# Patient Record
Sex: Female | Born: 1945
Health system: Southern US, Community
[De-identification: ages and names within clinical notes are randomized; demographics above are authoritative.]

## PROBLEM LIST (undated history)

## (undated) DIAGNOSIS — M26609 Unspecified temporomandibular joint disorder, unspecified side: Secondary | ICD-10-CM

## (undated) DIAGNOSIS — J189 Pneumonia, unspecified organism: Secondary | ICD-10-CM

## (undated) DIAGNOSIS — D333 Benign neoplasm of cranial nerves: Secondary | ICD-10-CM

## (undated) DIAGNOSIS — H8110 Benign paroxysmal vertigo, unspecified ear: Secondary | ICD-10-CM

## (undated) DIAGNOSIS — Z7901 Long term (current) use of anticoagulants: Secondary | ICD-10-CM

## (undated) DIAGNOSIS — I499 Cardiac arrhythmia, unspecified: Secondary | ICD-10-CM

## (undated) DIAGNOSIS — G4733 Obstructive sleep apnea (adult) (pediatric): Secondary | ICD-10-CM

## (undated) DIAGNOSIS — R350 Frequency of micturition: Secondary | ICD-10-CM

## (undated) DIAGNOSIS — E785 Hyperlipidemia, unspecified: Secondary | ICD-10-CM

## (undated) DIAGNOSIS — Z9289 Personal history of other medical treatment: Secondary | ICD-10-CM

## (undated) DIAGNOSIS — Z973 Presence of spectacles and contact lenses: Secondary | ICD-10-CM

## (undated) DIAGNOSIS — N840 Polyp of corpus uteri: Secondary | ICD-10-CM

## (undated) DIAGNOSIS — I10 Essential (primary) hypertension: Secondary | ICD-10-CM

## (undated) DIAGNOSIS — N950 Postmenopausal bleeding: Secondary | ICD-10-CM

## (undated) DIAGNOSIS — E119 Type 2 diabetes mellitus without complications: Secondary | ICD-10-CM

## (undated) DIAGNOSIS — M1990 Unspecified osteoarthritis, unspecified site: Secondary | ICD-10-CM

## (undated) DIAGNOSIS — I480 Paroxysmal atrial fibrillation: Secondary | ICD-10-CM

## (undated) DIAGNOSIS — Z8489 Family history of other specified conditions: Secondary | ICD-10-CM

## (undated) DIAGNOSIS — Z9989 Dependence on other enabling machines and devices: Secondary | ICD-10-CM

## (undated) DIAGNOSIS — D649 Anemia, unspecified: Secondary | ICD-10-CM

## (undated) HISTORY — PX: NASAL SINUS SURGERY: SHX719

## (undated) HISTORY — PX: ABDOMINAL HYSTERECTOMY: SHX81

## (undated) HISTORY — DX: Benign paroxysmal vertigo, unspecified ear: H81.10

---

## 1976-10-23 HISTORY — PX: TUBAL LIGATION: SHX77

## 1991-10-24 HISTORY — PX: KNEE SURGERY: SHX244

## 1998-10-23 HISTORY — PX: TOTAL ELBOW REPLACEMENT: SUR1214

## 1999-07-26 ENCOUNTER — Emergency Department (HOSPITAL_COMMUNITY): Admission: EM | Admit: 1999-07-26 | Discharge: 1999-07-26 | Payer: Self-pay | Admitting: Emergency Medicine

## 1999-07-26 ENCOUNTER — Encounter: Payer: Self-pay | Admitting: Emergency Medicine

## 2000-01-10 ENCOUNTER — Encounter: Admission: RE | Admit: 2000-01-10 | Discharge: 2000-01-10 | Payer: Self-pay | Admitting: Gynecology

## 2000-01-10 ENCOUNTER — Encounter: Payer: Self-pay | Admitting: Gynecology

## 2000-04-18 ENCOUNTER — Encounter: Payer: Self-pay | Admitting: Gynecology

## 2000-04-18 ENCOUNTER — Encounter: Admission: RE | Admit: 2000-04-18 | Discharge: 2000-04-18 | Payer: Self-pay | Admitting: Gynecology

## 2001-01-31 ENCOUNTER — Encounter: Admission: RE | Admit: 2001-01-31 | Discharge: 2001-01-31 | Payer: Self-pay | Admitting: Gynecology

## 2001-01-31 ENCOUNTER — Encounter: Payer: Self-pay | Admitting: Gynecology

## 2001-04-30 ENCOUNTER — Other Ambulatory Visit: Admission: RE | Admit: 2001-04-30 | Discharge: 2001-04-30 | Payer: Self-pay | Admitting: Gynecology

## 2002-02-05 ENCOUNTER — Encounter: Admission: RE | Admit: 2002-02-05 | Discharge: 2002-02-05 | Payer: Self-pay | Admitting: Gynecology

## 2002-02-05 ENCOUNTER — Encounter: Payer: Self-pay | Admitting: Gynecology

## 2002-03-28 ENCOUNTER — Encounter: Payer: Self-pay | Admitting: Gynecology

## 2002-03-28 ENCOUNTER — Encounter: Admission: RE | Admit: 2002-03-28 | Discharge: 2002-03-28 | Payer: Self-pay | Admitting: Gynecology

## 2002-05-09 ENCOUNTER — Other Ambulatory Visit: Admission: RE | Admit: 2002-05-09 | Discharge: 2002-05-09 | Payer: Self-pay | Admitting: Gynecology

## 2002-11-04 ENCOUNTER — Encounter: Admission: RE | Admit: 2002-11-04 | Discharge: 2002-11-04 | Payer: Self-pay | Admitting: Otolaryngology

## 2002-11-04 ENCOUNTER — Encounter: Payer: Self-pay | Admitting: Otolaryngology

## 2003-02-10 ENCOUNTER — Encounter: Payer: Self-pay | Admitting: Gynecology

## 2003-02-10 ENCOUNTER — Encounter: Admission: RE | Admit: 2003-02-10 | Discharge: 2003-02-10 | Payer: Self-pay | Admitting: Gynecology

## 2003-05-28 ENCOUNTER — Other Ambulatory Visit: Admission: RE | Admit: 2003-05-28 | Discharge: 2003-05-28 | Payer: Self-pay | Admitting: Gynecology

## 2004-02-17 ENCOUNTER — Encounter: Admission: RE | Admit: 2004-02-17 | Discharge: 2004-02-17 | Payer: Self-pay | Admitting: Gynecology

## 2004-03-17 ENCOUNTER — Encounter: Admission: RE | Admit: 2004-03-17 | Discharge: 2004-03-17 | Payer: Self-pay | Admitting: Orthopedic Surgery

## 2004-04-13 ENCOUNTER — Encounter: Admission: RE | Admit: 2004-04-13 | Discharge: 2004-04-13 | Payer: Self-pay | Admitting: Gynecology

## 2004-04-22 HISTORY — PX: ROTATOR CUFF REPAIR: SHX139

## 2004-06-15 ENCOUNTER — Other Ambulatory Visit: Admission: RE | Admit: 2004-06-15 | Discharge: 2004-06-15 | Payer: Self-pay | Admitting: Gynecology

## 2004-08-23 ENCOUNTER — Encounter: Admission: RE | Admit: 2004-08-23 | Discharge: 2004-08-23 | Payer: Self-pay | Admitting: Otolaryngology

## 2005-05-24 ENCOUNTER — Encounter: Admission: RE | Admit: 2005-05-24 | Discharge: 2005-05-24 | Payer: Self-pay | Admitting: Gynecology

## 2005-07-06 ENCOUNTER — Other Ambulatory Visit: Admission: RE | Admit: 2005-07-06 | Discharge: 2005-07-06 | Payer: Self-pay | Admitting: Gynecology

## 2005-10-14 ENCOUNTER — Encounter: Admission: RE | Admit: 2005-10-14 | Discharge: 2005-10-14 | Payer: Self-pay | Admitting: Otolaryngology

## 2006-05-29 ENCOUNTER — Encounter: Admission: RE | Admit: 2006-05-29 | Discharge: 2006-05-29 | Payer: Self-pay | Admitting: Gynecology

## 2006-07-17 ENCOUNTER — Other Ambulatory Visit: Admission: RE | Admit: 2006-07-17 | Discharge: 2006-07-17 | Payer: Self-pay | Admitting: Gynecology

## 2007-04-17 ENCOUNTER — Ambulatory Visit (HOSPITAL_BASED_OUTPATIENT_CLINIC_OR_DEPARTMENT_OTHER): Admission: RE | Admit: 2007-04-17 | Discharge: 2007-04-17 | Payer: Self-pay | Admitting: Orthopedic Surgery

## 2007-04-17 HISTORY — PX: CARPAL TUNNEL RELEASE: SHX101

## 2007-06-04 ENCOUNTER — Encounter: Admission: RE | Admit: 2007-06-04 | Discharge: 2007-06-04 | Payer: Self-pay | Admitting: Gynecology

## 2007-07-24 ENCOUNTER — Other Ambulatory Visit: Admission: RE | Admit: 2007-07-24 | Discharge: 2007-07-24 | Payer: Self-pay | Admitting: Gynecology

## 2008-07-07 ENCOUNTER — Encounter: Admission: RE | Admit: 2008-07-07 | Discharge: 2008-07-07 | Payer: Self-pay | Admitting: Gynecology

## 2008-10-23 HISTORY — PX: KNEE ARTHROSCOPY: SUR90

## 2008-10-23 HISTORY — PX: HAMMER TOE SURGERY: SHX385

## 2008-12-16 ENCOUNTER — Encounter: Admission: RE | Admit: 2008-12-16 | Discharge: 2008-12-16 | Payer: Self-pay | Admitting: Otolaryngology

## 2009-06-14 ENCOUNTER — Encounter: Admission: RE | Admit: 2009-06-14 | Discharge: 2009-06-14 | Payer: Self-pay | Admitting: Internal Medicine

## 2009-07-08 ENCOUNTER — Encounter: Admission: RE | Admit: 2009-07-08 | Discharge: 2009-07-08 | Payer: Self-pay | Admitting: Gynecology

## 2009-11-19 DIAGNOSIS — M25519 Pain in unspecified shoulder: Secondary | ICD-10-CM | POA: Insufficient documentation

## 2010-01-12 ENCOUNTER — Inpatient Hospital Stay (HOSPITAL_COMMUNITY): Admission: RE | Admit: 2010-01-12 | Discharge: 2010-01-14 | Payer: Self-pay | Admitting: Orthopedic Surgery

## 2010-01-12 HISTORY — PX: TOTAL KNEE ARTHROPLASTY: SHX125

## 2010-07-11 ENCOUNTER — Encounter: Admission: RE | Admit: 2010-07-11 | Discharge: 2010-07-11 | Payer: Self-pay | Admitting: Gynecology

## 2010-07-14 ENCOUNTER — Encounter: Admission: RE | Admit: 2010-07-14 | Discharge: 2010-07-14 | Payer: Self-pay | Admitting: Gynecology

## 2010-10-23 HISTORY — PX: TOE SURGERY: SHX1073

## 2011-01-15 LAB — COMPREHENSIVE METABOLIC PANEL
BUN: 9 mg/dL (ref 6–23)
CO2: 30 mEq/L (ref 19–32)
Calcium: 10.2 mg/dL (ref 8.4–10.5)
Chloride: 102 mEq/L (ref 96–112)
Creatinine, Ser: 0.61 mg/dL (ref 0.4–1.2)
GFR calc non Af Amer: >60 mL/min (ref >60)
Glucose, Bld: 95 mg/dL (ref 70–99)
Total Bilirubin: 0.9 mg/dL (ref 0.3–1.2)

## 2011-01-15 LAB — APTT: aPTT: 29 seconds (ref 24–37)

## 2011-01-15 LAB — GLUCOSE, CAPILLARY
Glucose-Capillary: 122 mg/dL — ABNORMAL HIGH (ref 70–99)
Glucose-Capillary: 132 mg/dL — ABNORMAL HIGH (ref 70–99)
Glucose-Capillary: 135 mg/dL — ABNORMAL HIGH (ref 70–99)
Glucose-Capillary: 173 mg/dL — ABNORMAL HIGH (ref 70–99)

## 2011-01-15 LAB — URINALYSIS, ROUTINE W REFLEX MICROSCOPIC
Bilirubin Urine: NEGATIVE
Glucose, UA: NEGATIVE mg/dL
Ketones, ur: NEGATIVE mg/dL
Nitrite: NEGATIVE
Nitrite: NEGATIVE
Protein, ur: NEGATIVE mg/dL
Specific Gravity, Urine: 1.016 (ref 1.005–1.030)
Urobilinogen, UA: 0.2 mg/dL (ref 0.0–1.0)
pH: 6.5 (ref 5.0–8.0)
pH: 6.5 (ref 5.0–8.0)

## 2011-01-15 LAB — BASIC METABOLIC PANEL
BUN: 6 mg/dL (ref 6–23)
Calcium: 8.9 mg/dL (ref 8.4–10.5)
Creatinine, Ser: 0.5 mg/dL (ref 0.4–1.2)
GFR calc Af Amer: >60 mL/min (ref >60)
GFR calc non Af Amer: >60 mL/min (ref >60)
GFR calc non Af Amer: >60 mL/min (ref >60)
Glucose, Bld: 132 mg/dL — ABNORMAL HIGH (ref 70–99)
Glucose, Bld: 137 mg/dL — ABNORMAL HIGH (ref 70–99)
Potassium: 3.6 mEq/L (ref 3.5–5.1)
Potassium: 4.3 mEq/L (ref 3.5–5.1)
Sodium: 135 mEq/L (ref 135–145)

## 2011-01-15 LAB — CBC
HCT: 31.6 % — ABNORMAL LOW (ref 36.0–46.0)
HCT: 40.9 % (ref 36.0–46.0)
Hemoglobin: 10.7 g/dL — ABNORMAL LOW (ref 12.0–15.0)
MCHC: 34.8 g/dL (ref 30.0–36.0)
MCV: 85 fL (ref 78.0–100.0)
Platelets: DECREASED 10*3/uL (ref 150–400)
RBC: 3.66 MIL/uL — ABNORMAL LOW (ref 3.87–5.11)
RBC: 4.81 MIL/uL (ref 3.87–5.11)
RDW: 13.6 % (ref 11.5–15.5)
RDW: 13.9 % (ref 11.5–15.5)
WBC: 7.3 10*3/uL (ref 4.0–10.5)
WBC: 9.2 10*3/uL (ref 4.0–10.5)

## 2011-01-15 LAB — PROTIME-INR
INR: 1 (ref 0.00–1.49)
INR: 1.11 (ref 0.00–1.49)
Prothrombin Time: 13.1 seconds (ref 11.6–15.2)
Prothrombin Time: 14.5 seconds (ref 11.6–15.2)

## 2011-01-15 LAB — URINE CULTURE

## 2011-01-15 LAB — TYPE AND SCREEN: ABO/RH(D): A POS

## 2011-01-15 LAB — ABO/RH: ABO/RH(D): A POS

## 2011-01-15 LAB — URINE MICROSCOPIC-ADD ON

## 2011-03-07 NOTE — Op Note (Signed)
NAMEBRECKEN, Erika Kim               ACCOUNT NO.:  [id number]   MEDICAL RECORD NO.:  [id number]          PATIENT TYPE:  AMB   LOCATION:  DSC                          FACILITY:  [facility]   PHYSICIAN:  Loreta Ave, M.D. DATE OF BIRTH:  08/03/1946   DATE OF PROCEDURE:  04/17/2007  DATE OF DISCHARGE:                               OPERATIVE REPORT   PREOPERATIVE DIAGNOSIS:  1. Right carpal tunnel syndrome.  2. Volar retinacular ganglion, right thumb.  3. Triggering A1 pulley, right thumb.   POSTOPERATIVE DIAGNOSIS:  1. Right carpal tunnel syndrome.  2. Volar retinacular ganglion, right thumb.  3. Triggering A1 pulley, right thumb.   PROCEDURE:  1. Right carpal tunnel release.  2. Excision ganglion volar aspect of the right thumb.  3. Release A1 pulley to obliterate triggering.   SURGEON:  Loreta Ave, M.D.   ASSISTANT:  Genene Churn. Barry Dienes, P.A.-C.   ANESTHESIA:  General.   BLOOD LOSS:  Minimal.   TOURNIQUET TIME:  35 minutes.   SPECIMENS:  None.   CULTURES:  None.   COMPLICATIONS:  None.   DRESSING:  Soft compressive with a well padded dressing and volar  splint.   DESCRIPTION OF PROCEDURE:  The patient was brought to the operating  room, placed on operating table in supine position.  After adequate  anesthesia had been obtained, tourniquet applied to the upper aspect of  the right arm.  Prepped and draped in the usual sterile fashion.  Exsanguination with elevation of Esmarch. Tourniquet inflated to 250  mmHg.  A small incision over the volar aspect carpal tunnel.  Skin and  subcutaneous tissue divided avoiding the palmar branch of median nerve.  Retinaculum over the carpal tunnel identified and incised under direct  visualization from the forearm fascia proximally to the palmar arch  distally.  Moderate constriction right in the middle of the carpal  tunnel.  The nerve completely decompressed including aponeurotomy.  Digital branch and motor branch identified,  protected, decompressed.  The base of the carpal tunnel was palpated going down over where her  fracture was and there were no other areas of pressure from previous  fracture.  Wound irrigated and closed with nylon.   Attention turned to the thumb.  Transverse incision over the A1 pulley  region.  Skin and subcutaneous tissue divided.  Neurovascular bundles  identified, protected, and retracted.  About a 4 mm volar ganglion,  thickened, coming off the middle of the A1 pulley, excised in its  entirety.  I then did a complete release of the A1 pulley from proximal  to distal obliterating all triggering of the flexor tendon.  Some  attrition on the tendon, but still functionally intact.  Once I  confirmed complete obliteration of triggering, the wound was irrigated  and closed with nylon.  Sterile compressive dressing applied.  Volar  splint applied.  Tourniquet deflated and removed.  Anesthesia reversed.  Brought to the recovery room.  Tolerated surgery well.  No  complications.      Loreta Ave, M.D.  Electronically Signed     DFM/MEDQ  D:  04/17/2007  T:  04/17/2007  Job:  [id number]

## 2011-06-08 ENCOUNTER — Other Ambulatory Visit: Payer: Self-pay | Admitting: Gynecology

## 2011-06-08 DIAGNOSIS — Z1231 Encounter for screening mammogram for malignant neoplasm of breast: Secondary | ICD-10-CM

## 2011-07-18 ENCOUNTER — Ambulatory Visit: Payer: Self-pay

## 2011-07-19 DIAGNOSIS — Z09 Encounter for follow-up examination after completed treatment for conditions other than malignant neoplasm: Secondary | ICD-10-CM | POA: Insufficient documentation

## 2011-07-20 ENCOUNTER — Ambulatory Visit
Admission: RE | Admit: 2011-07-20 | Discharge: 2011-07-20 | Disposition: A | Discharge status: Home / Self Care | Payer: 59 | Source: Ambulatory Visit | Attending: Gynecology | Admitting: Gynecology

## 2011-07-20 DIAGNOSIS — Z1231 Encounter for screening mammogram for malignant neoplasm of breast: Secondary | ICD-10-CM

## 2011-08-09 LAB — BASIC METABOLIC PANEL
CO2: 30
Calcium: 10.5
Creatinine, Ser: 0.59
GFR calc Af Amer: >60
GFR calc non Af Amer: >60
Sodium: 141

## 2011-08-09 LAB — POCT HEMOGLOBIN-HEMACUE: Hemoglobin: 13.5

## 2011-08-24 ENCOUNTER — Other Ambulatory Visit: Payer: Self-pay | Admitting: Gynecology

## 2012-04-29 DIAGNOSIS — D333 Benign neoplasm of cranial nerves: Secondary | ICD-10-CM | POA: Insufficient documentation

## 2012-06-17 ENCOUNTER — Other Ambulatory Visit: Payer: Self-pay | Admitting: Gynecology

## 2012-06-17 DIAGNOSIS — Z1231 Encounter for screening mammogram for malignant neoplasm of breast: Secondary | ICD-10-CM

## 2012-07-23 ENCOUNTER — Ambulatory Visit
Admission: RE | Admit: 2012-07-23 | Discharge: 2012-07-23 | Disposition: A | Discharge status: Home / Self Care | Payer: 59 | Source: Ambulatory Visit | Attending: Gynecology | Admitting: Gynecology

## 2012-07-23 DIAGNOSIS — Z1231 Encounter for screening mammogram for malignant neoplasm of breast: Secondary | ICD-10-CM

## 2012-08-27 ENCOUNTER — Other Ambulatory Visit: Payer: Self-pay | Admitting: Gynecology

## 2012-09-16 DIAGNOSIS — H8110 Benign paroxysmal vertigo, unspecified ear: Secondary | ICD-10-CM | POA: Insufficient documentation

## 2013-07-03 ENCOUNTER — Other Ambulatory Visit: Payer: Self-pay

## 2013-07-03 DIAGNOSIS — Z1231 Encounter for screening mammogram for malignant neoplasm of breast: Secondary | ICD-10-CM

## 2013-07-30 ENCOUNTER — Ambulatory Visit
Admission: RE | Admit: 2013-07-30 | Discharge: 2013-07-30 | Disposition: A | Discharge status: Home / Self Care | Payer: Medicare Other | Source: Ambulatory Visit

## 2013-07-30 DIAGNOSIS — Z1231 Encounter for screening mammogram for malignant neoplasm of breast: Secondary | ICD-10-CM

## 2013-08-06 DIAGNOSIS — F5104 Psychophysiologic insomnia: Secondary | ICD-10-CM | POA: Insufficient documentation

## 2013-08-08 ENCOUNTER — Ambulatory Visit (INDEPENDENT_AMBULATORY_CARE_PROVIDER_SITE_OTHER): Payer: Medicare Other | Admitting: Neurology

## 2013-08-08 ENCOUNTER — Encounter: Payer: Self-pay | Admitting: Neurology

## 2013-08-08 ENCOUNTER — Telehealth: Payer: Self-pay | Admitting: Neurology

## 2013-08-08 VITALS — BP 141/67 | HR 54 | Temp 97.9°F | Ht 63.0 in | Wt 214.0 lb

## 2013-08-08 DIAGNOSIS — R0609 Other forms of dyspnea: Secondary | ICD-10-CM

## 2013-08-08 DIAGNOSIS — E669 Obesity, unspecified: Secondary | ICD-10-CM | POA: Insufficient documentation

## 2013-08-08 DIAGNOSIS — G4733 Obstructive sleep apnea (adult) (pediatric): Secondary | ICD-10-CM

## 2013-08-08 DIAGNOSIS — R0683 Snoring: Secondary | ICD-10-CM | POA: Insufficient documentation

## 2013-08-08 DIAGNOSIS — G4763 Sleep related bruxism: Secondary | ICD-10-CM

## 2013-08-08 DIAGNOSIS — R0989 Other specified symptoms and signs involving the circulatory and respiratory systems: Secondary | ICD-10-CM

## 2013-08-08 DIAGNOSIS — I10 Essential (primary) hypertension: Secondary | ICD-10-CM

## 2013-08-08 NOTE — Telephone Encounter (Signed)
I called and left a message for the patient that her estimated cost for the sleep study would be $343.35. Patient to callback to the office to speak with me Jasmine December.

## 2013-08-08 NOTE — Patient Instructions (Addendum)
Based on your symptoms and your exam I believe you are at risk for obstructive sleep apnea or OSA, and I think we should proceed with a sleep study to determine whether you do or do not have OSA and how severe it is. If you have more than mild OSA, I want you to consider treatment with CPAP. Please remember, the risks and ramifications of moderate to severe obstructive sleep apnea or OSA are: Cardiovascular disease, including congestive heart failure, stroke, difficult to control hypertension, arrhythmias, and even type 2 diabetes has been linked to untreated OSA. Sleep apnea causes disruption of sleep and sleep deprivation in most cases, which, in turn, can cause recurrent headaches, problems with memory, mood, concentration, focus, and vigilance. Most people with untreated sleep apnea report excessive daytime sleepiness, which can affect their ability to drive. Please do not drive if you feel sleepy.  I will see you back after your sleep study to go over the test results and where to go from there. We will call you after your sleep study and to set up an appointment at the time.   Please remember to try to maintain good sleep hygiene, which means: Keep a regular sleep and wake schedule, try not to exercise or have a meal within 2 hours of your bedtime, try to keep your bedroom conducive for sleep, that is, cool and dark, without light distractors such as an illuminated alarm clock, and refrain from watching TV right before sleep or in the middle of the night and do not keep the TV or radio on during the night. Also, try not to use or play on electronic devices at bedtime, such as your cell phone, tablet PC or laptop. If you like to read at bedtime on an electronic device, try to dim the background light as much as possible.  Please bring you mouth guard for the test and we have mutually agreed, that you will not start your sleeping pill just yet.

## 2013-08-08 NOTE — Progress Notes (Signed)
Subjective:    Patient ID: Erika Kim is a 67 y.o. female.  HPI  Erika Foley, MD, PhD [facility] [address] [address] [place], [place] [address]  Dear Dr. Eloise Harman,  I saw your patient, Erika Kim, upon your kind request in my neurologic clinic today for initial consultation of her sleep disorder, in particular nonrestorative sleep, sleep disruption, morning headaches, and concern for objective sleep apnea. The patient is unaccompanied today. As you know, [name] is a very pleasant 67 year old right-handed woman with an underlying medical history of hyperlipidemia, hypertension, DM, recurrent headaches, positional vertigo, left shoulder pain, and sinus congestion, status post sinus surgery, left knee surgery, tubal ligation, right rotator cuff surgery, right total knee replacement surgery, right great toe surgery, who is complaining of chronic poor quality sleep, snoring, and recently has had some memory problems as well. She has had witnessed apneas, including one time when she had a head MRI. She has a Hx of acoustic neuroma, she sees Dr. Haroldine Laws in ENT and Dr. Manson Passey at [facility].  She was prescribed Ambien 5 mg recently, but has not filled it yet.   Her typical bedtime is reported to be around 11 to 11:30 PM and usual wake time is around 5 AM. Sleep onset typically occurs within a few minutes. She reports feeling adequately rested upon awakening. She wakes up on an average 3 times in the middle of the night and has to go to the bathroom 0 to 1 times on a typical night. She admits to occasional morning headaches.  She reports excessive daytime somnolence (EDS) and Her Epworth Sleepiness Score (ESS) is 6/24 today. She has not fallen asleep while driving. The patient has not been taking a planned nap, but occasionally has fallen asleep after lunch.   She has been known to snore for the past many years. Snoring is reportedly moderate to loud, and associated  with choking sounds and witnessed apneas. The patient admits to a sense of choking or strangling feeling occasionally. There is no report of nighttime reflux, with rare nighttime cough experienced. The patient has not noted any RLS symptoms and is not known to kick while asleep or before falling asleep, but does endorse occasional paresthesias in her big toe, that she tries to "shake out". There is no family history of RLS or OSA. She is claustrophobic and has concerns about having to use a FFM.  She is not a very restless sleeper.   She denies cataplexy, sleep paralysis, hypnagogic or hypnopompic hallucinations, or sleep attacks. She does not report any vivid dreams, nightmares, dream enactments, or parasomnias, such as sleep talking or sleep walking. In fact, she rarely dreams, she thinks. The patient has not had a sleep study or a home sleep test.  She consumes 1 caffeinated beverages per day, usually in the form of coffee in AM.  Her bedroom is usually dark and cool. There is no TV in the bedroom.  She tends to grind her teeth and has a bite guard.   Her Past Medical History Is Significant For: Past Medical History  Diagnosis Date  . Benign paroxysmal positional vertigo   . Follow-up examination following completed treatment with high-risk medications, not elsewhere classified   . Obesity, unspecified   . Type II or unspecified type diabetes mellitus without mention of complication, not stated as uncontrolled   . Other and unspecified hyperlipidemia   . Unspecified essential hypertension   . Persistent disorder of initiating or  maintaining sleep   . Routine general medical examination at a health care facility 08/06/2013    Her Past Surgical History Is Significant For: Past Surgical History  Procedure Laterality Date  . Nasal sinus surgery  3/97  . Knee surgery Left 1993  . Shoulder surgery Right 7/05  . Total knee arthroplasty Right 3/11  . Toe surgery Right 2012    Great toe     Her Family History Is Significant For: Family History  Problem Relation Age of Onset  . Heart failure Mother   . Heart failure Father     Her Social History Is Significant For: History   Social History  . Marital Status: Married    Spouse Name: N/A    Number of Children: N/A  . Years of Education: N/A   Social History Main Topics  . Smoking status: Never Smoker   . Smokeless tobacco: None  . Alcohol Use: No  . Drug Use: No  . Sexual Activity: None   Other Topics Concern  . None   Social History Narrative   Married w/2 children (boy and girl).  Retired.    Her Allergies Are:  Allergies  Allergen Reactions  . Augmentin [Amoxicillin-Pot Clavulanate] Itching  :   Her Current Medications Are:  Outpatient Encounter Prescriptions as of 08/08/2013  Medication Sig Dispense Refill  . aspirin 325 MG EC tablet Take 325 mg by mouth daily.      Marland Kitchen atorvastatin (LIPITOR) 40 MG tablet Take 1 tablet by mouth daily.      . calcium carbonate (OS-CAL) 600 MG TABS tablet Take 600 mg by mouth daily with breakfast.      . cetirizine (ZYRTEC) 10 MG tablet Take 10 mg by mouth daily.      . Cholecalciferol (VITAMIN D3) 2000 UNITS TABS Take 2 capsules by mouth daily.      . fish oil-omega-3 fatty acids 1000 MG capsule Take 2 g by mouth daily.      Marland Kitchen guaiFENesin (MUCINEX) 600 MG 12 hr tablet Take 600 mg by mouth 2 (two) times daily.      Marland Kitchen ipratropium (ATROVENT) 0.03 % nasal spray Place 2 sprays into the nose every 12 (twelve) hours.      Marland Kitchen lisinopril-hydrochlorothiazide (PRINZIDE,ZESTORETIC) 20-12.5 MG per tablet Take 1 tablet by mouth daily.      . metoprolol succinate (TOPROL-XL) 50 MG 24 hr tablet Take 1 tablet by mouth 2 (two) times daily.      . Multiple Vitamin (MULTIVITAMIN) tablet Take 1 tablet by mouth daily.      Marland Kitchen zolpidem (AMBIEN) 5 MG tablet Take 1 tablet by mouth at bedtime as needed and may repeat dose one time if needed.      . zoster vaccine live, PF, (ZOSTAVAX) [address]  UNT/0.65ML injection Inject 0.65 mLs into the skin once.       No facility-administered encounter medications on file as of 08/08/2013.   Review of Systems:  Out of a complete 14 point review of systems, all are reviewed and negative with the exception of these symptoms as listed below:   Review of Systems  HENT: Positive for hearing loss and tinnitus.   Respiratory:       Snoring  Endocrine: Positive for heat intolerance.  Musculoskeletal: Positive for arthralgias.  Allergic/Immunologic: Positive for environmental allergies.  Neurological: Positive for dizziness and headaches.  Psychiatric/Behavioral: Positive for sleep disturbance.    Objective:  Neurologic Exam  Physical Exam Physical Examination:   Filed Vitals:  08/08/13 0953  BP: 141/67  Pulse: 54  Temp: 97.9 F (36.6 C)   General Examination: The patient is a very pleasant 67 y.o. female in no acute distress. She appears well-developed and well-nourished and well groomed. She is overweight.  HEENT: Normocephalic, atraumatic, pupils are equal, round and reactive to light and accommodation. Funduscopic exam is normal with sharp disc margins noted. Extraocular tracking is good without limitation to gaze excursion or nystagmus noted. Normal smooth pursuit is noted. Hearing is grossly intact. Tympanic membranes are clear bilaterally. Face is symmetric with normal facial animation and normal facial sensation. Speech is clear with no dysarthria noted. There is no hypophonia. There is no lip, neck/head, jaw or voice tremor. Neck is supple with full range of passive and active motion. There are no carotid bruits on auscultation. Oropharynx exam reveals: mild mouth dryness, adequate dental hygiene and mild airway crowding, due to narrow airway entry and floppy soft palate. Mallampati is class II. Tongue protrudes centrally and palate elevates symmetrically. Tonsils are absent. Neck size is 14.5 inches.   Chest: Clear to auscultation  without wheezing, rhonchi or crackles noted.  Heart: S1+S2+0, regular and normal without murmurs, rubs or gallops noted.   Abdomen: Soft, non-tender and non-distended with normal bowel sounds appreciated on auscultation. Abdomen in obese.  Extremities: There is trace pitting edema in the distal lower extremities bilaterally. Pedal pulses are intact.  Skin: Warm and dry without trophic changes noted. There are no varicose veins.  Musculoskeletal: exam reveals no obvious joint deformities, tenderness or joint swelling or erythema.   Neurologically:  Mental status: The patient is awake, alert and oriented in all 4 spheres. Her memory, attention, language and knowledge are appropriate. There is no aphasia, agnosia, apraxia or anomia. Speech is clear with normal prosody and enunciation. Thought process is linear. Mood is congruent and affect is normal.  Cranial nerves are as described above under HEENT exam. In addition, shoulder shrug is normal with equal shoulder height noted. Motor exam: Normal bulk, strength and tone is noted. There is no drift, tremor or rebound. Romberg is negative. Reflexes are 1+ in UEs, and trace in the LEs. Fine motor skills are intact with normal finger taps, normal hand movements, normal rapid alternating patting, normal foot taps and normal foot agility.  Cerebellar testing shows no dysmetria or intention tremor on finger to nose testing. Heel to shin is unremarkable bilaterally. There is no truncal or gait ataxia.  Sensory exam is intact to light touch, pinprick, vibration, temperature sense and proprioception in the upper and lower extremities.  Gait, station and balance are unremarkable. No veering to one side is noted. No leaning to one side is noted. Posture is age-appropriate and stance is narrow based. No problems turning are noted. She turns en bloc. Tandem walk is unremarkable. Intact toe and heel stance is noted.               Assessment and Plan:   In  summary, STINA GANE is a very pleasant 67 y.o.-year old female with a history and physical exam concerning for obstructive sleep apnea (OSA). I had a long chat with the patient about my findings and the diagnosis, its prognosis and treatment options. We talked about medical treatments and non-pharmacological approaches. I explained in particular the risks and ramifications of untreated moderate to severe OSA, especially with respect to developing cardiovascular disease down the Road, including congestive heart failure, difficult to treat hypertension, cardiac arrhythmias, or stroke. Even type 2  diabetes has in part been linked to untreated OSA. We talked about trying to maintain a healthy lifestyle in general, as well as the importance of weight control. I encouraged the patient to eat healthy, exercise daily and keep well hydrated, to keep a scheduled bedtime and wake time routine, to not skip any meals and eat healthy snacks in between meals.  I recommended the following at this time: sleep study with potential positive airway pressure titration.  I explained the sleep test procedure to the patient and also outlined possible surgical and non-surgical treatment options of OSA, including the use of a custom-made dental device, upper airway surgical options, such as pillar implants, radiofrequency surgery, tongue base surgery, and UPPP. I also explained the CPAP treatment option to the patient, who indicated that she would be reluctant but willing to try CPAP if the need arises. She indicates that she is claustrophobic and has concerns about having to use a fullface mask. We will certainly try to use a nasal mask first. I explained the importance of being compliant with PAP treatment, not only for insurance purposes but primarily to improve Her symptoms, and for the patient's long term health benefit, including to reduce Her cardiovascular risks. I answered all her questions today and the patient was in  agreement. I would like to see her back after the sleep study is completed and encouraged her to call with any interim questions, concerns, problems or updates.   Thank you very much for allowing me to participate in the care of this nice patient. If I can be of any further assistance to you please do not hesitate to call me at [phone].  Sincerely,   Erika Foley, MD, PhD

## 2013-09-04 ENCOUNTER — Ambulatory Visit (INDEPENDENT_AMBULATORY_CARE_PROVIDER_SITE_OTHER): Payer: Medicare Other

## 2013-09-04 DIAGNOSIS — G479 Sleep disorder, unspecified: Secondary | ICD-10-CM

## 2013-09-04 DIAGNOSIS — I10 Essential (primary) hypertension: Secondary | ICD-10-CM

## 2013-09-04 DIAGNOSIS — G4763 Sleep related bruxism: Secondary | ICD-10-CM

## 2013-09-04 DIAGNOSIS — G4733 Obstructive sleep apnea (adult) (pediatric): Secondary | ICD-10-CM

## 2013-09-04 DIAGNOSIS — E669 Obesity, unspecified: Secondary | ICD-10-CM

## 2013-09-17 ENCOUNTER — Telehealth: Payer: Self-pay | Admitting: Neurology

## 2013-09-17 NOTE — Telephone Encounter (Signed)
I called the patient to give her results. Patient understood that the study showed mild obstructive sleep apnea and that Dr. Frances Furbish recommends she comeback in for a CPAP Titration study because she thinks she would benefit from CPAP. Patient understood and I gave her the estimated cost of 298.29 for this study. Patient stated she needs to speak with her husband about having another study and the cost. I informed the patient that she can make payments and that she will be billed for the first study in which no money was collected.

## 2013-09-17 NOTE — Telephone Encounter (Signed)
Please call and notify the patient that the recent sleep study did confirm the diagnosis of obstructive sleep apnea, but findings are mild. Given her sleep related complaints, she may benefit from treatment of even mild OSA with CPAP. This will require a repeat sleep study for proper titration and mask fitting. Please explain to patient that we can arrange for a CPAP titration study. Alternatively, if she rather we talk about her test results, and treatment options in more detail, I can see her back in clinic. Please inquire and let me know if she is okay with coming back for CPAP titration or FU appt. I have not placed an order for CPAP titration (yet).  Huston Foley, MD, PhD [facility])

## 2013-09-17 NOTE — Telephone Encounter (Signed)
Of note, she had moderate to loud snoring throughout the study. Please see note below as well.

## 2013-09-22 ENCOUNTER — Encounter: Payer: Self-pay | Admitting: *Deleted

## 2013-09-22 ENCOUNTER — Other Ambulatory Visit: Payer: Self-pay | Admitting: Neurology

## 2013-09-22 DIAGNOSIS — G4733 Obstructive sleep apnea (adult) (pediatric): Secondary | ICD-10-CM

## 2013-09-22 DIAGNOSIS — J0100 Acute maxillary sinusitis, unspecified: Secondary | ICD-10-CM | POA: Insufficient documentation

## 2013-10-09 ENCOUNTER — Ambulatory Visit (INDEPENDENT_AMBULATORY_CARE_PROVIDER_SITE_OTHER): Payer: Medicare Other | Admitting: Neurology

## 2013-10-09 ENCOUNTER — Encounter (INDEPENDENT_AMBULATORY_CARE_PROVIDER_SITE_OTHER): Payer: Self-pay

## 2013-10-09 ENCOUNTER — Encounter: Payer: Self-pay | Admitting: Neurology

## 2013-10-09 VITALS — BP 159/74 | HR 58 | Temp 98.5°F | Ht 63.0 in

## 2013-10-09 DIAGNOSIS — G4733 Obstructive sleep apnea (adult) (pediatric): Secondary | ICD-10-CM

## 2013-10-09 DIAGNOSIS — E669 Obesity, unspecified: Secondary | ICD-10-CM

## 2013-10-09 DIAGNOSIS — I10 Essential (primary) hypertension: Secondary | ICD-10-CM

## 2013-10-09 NOTE — Progress Notes (Signed)
Subjective:    Patient ID: Erika Kim is a 67 y.o. female.  HPI    Interim history:   [name] is a very pleasant 67 year old right-handed woman with an underlying medical history of hyperlipidemia, acoustic neuroma, hypertension, DM, recurrent headaches, positional vertigo, left shoulder pain, and sinus congestion, status post sinus surgery, left knee surgery, tubal ligation, right rotator cuff surgery, right total knee replacement surgery, right great toe surgery, who presents for followup consultation of her sleep disorder, after her sleep study. She is unaccompanied today. I first met her on 08/08/2013 at which time she complained of chronic poor quality sleep, snoring, and recent onset of memory problems, as well as witnessed apneas.  She had a baseline sleep study on 09/04/2013 and I went over her test results with her in detail today. Her sleep efficiency was reduced at 66.1% with a prolonged latency to sleep of 34.5 minutes. Wake after sleep onset was 91 minutes with moderate sleep fragmentation noted. She had an increased percentage of stage I and stage II sleep, and near normal percentage of deep sleep and a decreased percentage of REM sleep at 13.3% with a high normal REM latency of 117 minutes. She had no significant periodic leg movements of sleep. She had moderate to at times loud snoring. She had a total AHI of 6.4 per hour with further elevation to 20.9 per hour in the supine position. Her baseline oxygen saturation was 93%, her nadir was 88%. She spent 10 minutes and 14 seconds below the saturation of 90% for the night.  We called her with the test results in the interim and asked her to consider returning for CPAP titration study and she reported that she would like to discuss this with her husband first. Today, she reports no new sleep related issues, going to sleep is not a big issue, but she will often wake up around 2 AM and cannot go back to sleep. She does not want to try  the Ambien that she was prescribed by her primary care physician. She reports a recent sinus infection for which she was treated with antibiotics. She finished a Z-Pak and also is on the last dose of Bactrim. She still has some residual cough and congestion. She is going to be out of town for about 2 months. She will travel next week and will come back in the early part of March.  Her Past Medical History Is Significant For: Past Medical History  Diagnosis Date  . Benign paroxysmal positional vertigo   . Follow-up examination following completed treatment with high-risk medications, not elsewhere classified   . Obesity, unspecified   . Type II or unspecified type diabetes mellitus without mention of complication, not stated as uncontrolled   . Other and unspecified hyperlipidemia   . Unspecified essential hypertension   . Persistent disorder of initiating or maintaining sleep   . Routine general medical examination at a health care facility 08/06/2013  . Snoring 08/08/2013  . Obesity 08/08/2013    Her Past Surgical History Is Significant For: Past Surgical History  Procedure Laterality Date  . Nasal sinus surgery  3/97  . Knee surgery Left 1993  . Shoulder surgery Right 7/05  . Total knee arthroplasty Right 3/11  . Toe surgery Right 2012    Great toe    Her Family History Is Significant For: Family History  Problem Relation Age of Onset  . Heart failure Mother   . Heart failure Father  Her Social History Is Significant For: History   Social History  . Marital Status: Married    Spouse Name: N/A    Number of Children: N/A  . Years of Education: N/A   Social History Main Topics  . Smoking status: Never Smoker   . Smokeless tobacco: None  . Alcohol Use: No  . Drug Use: No  . Sexual Activity: None   Other Topics Concern  . None   Social History Narrative   Married w/2 children (boy and girl).  Retired.    Her Allergies Are:  Allergies  Allergen Reactions  .  Augmentin [Amoxicillin-Pot Clavulanate] Itching  :   Her Current Medications Are:  Outpatient Encounter Prescriptions as of 10/09/2013  Medication Sig  . aspirin 325 MG EC tablet Take 325 mg by mouth daily.  Marland Kitchen atorvastatin (LIPITOR) 40 MG tablet Take 1 tablet by mouth daily.  . calcium carbonate (OS-CAL) 600 MG TABS tablet Take 600 mg by mouth daily with breakfast.  . cetirizine (ZYRTEC) 10 MG tablet Take 10 mg by mouth daily.  . Cholecalciferol (VITAMIN D3) 2000 UNITS TABS Take 2 capsules by mouth daily.  . fish oil-omega-3 fatty acids 1000 MG capsule Take 2 g by mouth daily.  Marland Kitchen guaiFENesin (MUCINEX) 600 MG 12 hr tablet Take 600 mg by mouth 2 (two) times daily.  Marland Kitchen ipratropium (ATROVENT) 0.03 % nasal spray Place 2 sprays into the nose every 12 (twelve) hours.  Marland Kitchen lisinopril-hydrochlorothiazide (PRINZIDE,ZESTORETIC) 20-12.5 MG per tablet Take 1 tablet by mouth daily.  . metoprolol succinate (TOPROL-XL) 50 MG 24 hr tablet Take 1 tablet by mouth 2 (two) times daily.  . Multiple Vitamin (MULTIVITAMIN) tablet Take 1 tablet by mouth daily.  Marland Kitchen zoster vaccine live, PF, (ZOSTAVAX) 16109 UNT/0.65ML injection Inject 0.65 mLs into the skin once.  Marland Kitchen zolpidem (AMBIEN) 5 MG tablet Take 1 tablet by mouth at bedtime as needed and may repeat dose one time if needed.  :  Review of Systems:  Out of a complete 14 point review of systems, all are reviewed and negative with the exception of these symptoms as listed below:   Review of Systems  Constitutional: Negative.   HENT: Positive for hearing loss, postnasal drip, rhinorrhea and tinnitus.   Eyes: Negative.   Respiratory: Positive for cough and wheezing.   Cardiovascular: Negative.   Gastrointestinal: Negative.   Endocrine: Negative.   Genitourinary: Negative.   Musculoskeletal: Negative.   Skin: Negative.   Allergic/Immunologic: Negative.   Neurological: Negative.   Hematological: Negative.   Psychiatric/Behavioral: Positive for sleep disturbance.     Objective:  Neurologic Exam  Physical Exam Physical Examination:   Filed Vitals:   10/09/13 0933  BP: 159/74  Pulse: 58  Temp: 98.5 F (36.9 C)   General Examination: The patient is a very pleasant 67 y.o. female in no acute distress. She appears well-developed and well-nourished and well groomed. She is overweight.  HEENT: Normocephalic, atraumatic, pupils are equal, round and reactive to light and accommodation. Funduscopic exam is normal with sharp disc margins noted. Extraocular tracking is good without limitation to gaze excursion or nystagmus noted. Normal smooth pursuit is noted. Hearing is grossly intact. Face is symmetric with normal facial animation and normal facial sensation. Speech is clear with no dysarthria noted. There is no hypophonia. Her speech is slightly nasal sounding. There is no pharyngeal erythema. There is no lip, neck/head, jaw or voice tremor. Neck is supple with full range of passive and active motion. There are no  carotid bruits on auscultation. Oropharynx exam reveals: mild mouth dryness, adequate dental hygiene and mild airway crowding, due to narrow airway entry and floppy soft palate. Mallampati is class II. Tongue protrudes centrally and palate elevates symmetrically. Tonsils are absent.   Chest: Clear to auscultation without wheezing, rhonchi or crackles noted.  Heart: S1+S2+0, regular and normal without murmurs, rubs or gallops noted.   Abdomen: Soft, non-tender and non-distended with normal bowel sounds appreciated on auscultation. Abdomen in obese.  Extremities: There is trace pitting edema in the distal lower extremities bilaterally. Pedal pulses are intact.  Skin: Warm and dry without trophic changes noted. There are no varicose veins.  Musculoskeletal: exam reveals no obvious joint deformities, tenderness or joint swelling or erythema.   Neurologically:  Mental status: The patient is awake, alert and oriented in all 4 spheres. Her memory,  attention, language and knowledge are appropriate. There is no aphasia, agnosia, apraxia or anomia. Speech is clear with normal prosody and enunciation. Thought process is linear. Mood is congruent and affect is normal.  Cranial nerves are as described above under HEENT exam. In addition, shoulder shrug is normal with equal shoulder height noted. Motor exam: Normal bulk, strength and tone is noted. There is no drift, tremor or rebound. Romberg is negative. Reflexes are 1+ in UEs, and trace in the LEs. Fine motor skills are intact with normal finger taps, normal hand movements, normal rapid alternating patting, normal foot taps and normal foot agility.  Cerebellar testing shows no dysmetria or intention tremor on finger to nose testing. There is no truncal or gait ataxia.  Sensory exam is intact to light touch, pinprick, vibration, temperature sense and proprioception in the upper and lower extremities.  Gait, station and balance are unremarkable, with the exception that she stands up slowly. No veering to one side is noted. No leaning to one side is noted. Posture is age-appropriate and stance is narrow based. No problems turning are noted. She turns en bloc. Tandem walk is slightly difficult for her.                Assessment and Plan:   In summary, ROZALIA DINO is a very pleasant 67 year old female with a history of hyperlipidemia, acoustic neuroma, hypertension, DM, recurrent headaches, positional vertigo, left shoulder pain, and sinus congestion, status post sinus surgery, left knee surgery, tubal ligation, right rotator cuff surgery, right total knee replacement surgery, right great toe surgery, who complains of chronic poor quality sleep, snoring, and recent onset of memory problems. She had a recent baseline sleep study and I discussed the findings with her in detail today. This study confirms the diagnosis of mild to moderate sleep apnea. In light of her sleep related complaints and her  underlying medical history I had suggested that she come back for CPAP titration study. Today I revisited with her treatment options for mild to moderate obstructive sleep apnea. She is advised that she could certainly try CPAP and perhaps try it for about 3 months to see if her symptoms improve, her sleep was better consolidated and her sleep quality improves. Alternatively we can also have her consult with a dentist regarding a custom-made oral appliance. I think given her symptom constellation and her history it is worthwhile trying CPAP. She has concerns about her claustrophobia and I explained to her that we can certainly try a nasal pillows mask which is often well-tolerated even by patients who reports claustrophobia. Also she most likely will not need a very  high pressure to improve her sleep disorder breathing. We again talked about a healthy lifestyle in general, as well as the importance of weight control. I encouraged the patient to eat healthy, exercise daily and keep well hydrated, to keep a scheduled bedtime and wake time routine, to not skip any meals and eat healthy snacks in between meals.  I recommended the following at this time: sleep study with CPAP titration. She is willing to embark on this at this time. I explained the sleep test procedure to the patient and also outlined the CPAP treatment option again to the patient if for some reason she does not tolerate CPAP or does not have symptom improvement to her liking we will consider an oral appliance. She was in agreement with the plan. Due to her travel plans we can schedule her in March for the CPAP titration. In the interim, she is advised to strive for weight loss and sleep off her back. She indicates that she prefers to sleep on her stomach. I answered all her questions today and the patient was in agreement. I would like to see her back after the sleep study is completed and encouraged her to call with any interim questions, concerns,  problems or updates.  Most of my 30 minute visit today was spent in counseling and coordination of care, reviewing test results and reviewing medication.

## 2013-10-09 NOTE — Patient Instructions (Signed)
Please schedule your CPAP titration study for March, when you are back from [place]. I think your sleep disorder is worth while treating with CPAP and I would like for you to try CPAP for at least 3 months to help improve your sleep and your symptoms of poor quality sleep and memory loss.

## 2014-01-26 ENCOUNTER — Ambulatory Visit (INDEPENDENT_AMBULATORY_CARE_PROVIDER_SITE_OTHER): Payer: Medicare HMO

## 2014-01-26 DIAGNOSIS — G479 Sleep disorder, unspecified: Secondary | ICD-10-CM

## 2014-01-26 DIAGNOSIS — G4733 Obstructive sleep apnea (adult) (pediatric): Secondary | ICD-10-CM

## 2014-02-11 ENCOUNTER — Telehealth: Payer: Self-pay | Admitting: Neurology

## 2014-02-11 DIAGNOSIS — G4733 Obstructive sleep apnea (adult) (pediatric): Secondary | ICD-10-CM

## 2014-02-11 NOTE — Telephone Encounter (Signed)
Please call and inform patient that I have entered an order for treatment with PAP. She did well during the latest sleep study with CPAP. We will, therefore, arrange for a machine for home use through a DME (durable medical equipment) company of Her choice; and I will see the patient back in follow-up in about 6 weeks. Please also explain to the patient that I will be looking out for compliance data downloaded from the machine, which can be done remotely through a modem at times or stored on an SD card in the back of the machine. At the time of the followup appointment we will discuss sleep study results and how it is going with PAP treatment at home. Please advise patient to bring Her machine at the time of the visit; at least for the first visit, even though this is cumbersome. Bringing the machine for every visit after that may not be needed, but often helps for the first visit. Please also make sure, the patient has a follow-up appointment with me in about 6 weeks from the setup date, thanks.   Anysa Tacey, MD, PhD [facility] ([facility]  

## 2014-02-12 ENCOUNTER — Other Ambulatory Visit: Payer: Self-pay | Admitting: *Deleted

## 2014-02-12 DIAGNOSIS — G4733 Obstructive sleep apnea (adult) (pediatric): Secondary | ICD-10-CM

## 2014-02-12 NOTE — Telephone Encounter (Signed)
I called and left a message for the patient to callback, so that I Ivin Booty) may get the name of the [facility] to fax her CPAP order.

## 2014-02-12 NOTE — Telephone Encounter (Signed)
I called and left a message for the patient about her recent CPAP titration study  Results. I informed the patient that she did well on CPAP during the night of her study. Dr. Rexene Alberts recommend CPAP therapy at home and if she could call back to the office to inform me Ivin Booty) which [employer] she would like for me to fax her CPAP order. I will fax a copy of the report to Dr. Kaylyn Lim office and mail a copy to the patient along with a follow up instruction letter.

## 2014-02-13 ENCOUNTER — Encounter: Payer: Self-pay | Admitting: *Deleted

## 2014-02-13 NOTE — Telephone Encounter (Signed)
I callback to speak with the patient after receiving a note stating she doesn't have a DME company. I left a message informing the patient that I will send her order to [facility].

## 2014-02-25 ENCOUNTER — Telehealth: Payer: Self-pay | Admitting: Neurology

## 2014-02-25 NOTE — Telephone Encounter (Signed)
I called and left a message for the patient to callback to the office to schedule her Post CPAP appt. with Dr. Rexene Alberts.

## 2014-02-26 ENCOUNTER — Ambulatory Visit (INDEPENDENT_AMBULATORY_CARE_PROVIDER_SITE_OTHER): Payer: Medicare HMO

## 2014-02-26 ENCOUNTER — Ambulatory Visit (INDEPENDENT_AMBULATORY_CARE_PROVIDER_SITE_OTHER): Payer: Medicare HMO | Admitting: Podiatry

## 2014-02-26 ENCOUNTER — Encounter: Payer: Self-pay | Admitting: Podiatry

## 2014-02-26 VITALS — BP 141/61 | HR 68 | Resp 16

## 2014-02-26 DIAGNOSIS — M779 Enthesopathy, unspecified: Secondary | ICD-10-CM

## 2014-02-27 NOTE — Progress Notes (Signed)
She presents today complaining of burning and forefoot tightness bilateral.  Objective: Vital signs are stable she is alert and oriented x3. Pulses are strongly palpable bilateral. No loss of sensorium per Semmes-Weinstein monofilament bilateral forefoot. Normal neurologic sensorium. Deep tendon reflexes are intact. Muscle strength appears to be normal. She has good range of motion of all of her lesser and greater metatarsophalangeal joints. No pain on in range of motion and she has good forefoot midfoot range of motion.  Assessment: Capsulitis forefoot bilateral.  Plan: Followup with me as needed. May consider orthotics in the future.

## 2014-02-27 NOTE — Telephone Encounter (Signed)
Patient called back to schedule her post CPAP appt. Patient is scheduled for April 08, 2014.

## 2014-04-01 DIAGNOSIS — E1151 Type 2 diabetes mellitus with diabetic peripheral angiopathy without gangrene: Secondary | ICD-10-CM | POA: Insufficient documentation

## 2014-04-01 DIAGNOSIS — I739 Peripheral vascular disease, unspecified: Secondary | ICD-10-CM | POA: Insufficient documentation

## 2014-04-08 ENCOUNTER — Encounter (INDEPENDENT_AMBULATORY_CARE_PROVIDER_SITE_OTHER): Payer: Self-pay

## 2014-04-08 ENCOUNTER — Encounter: Payer: Self-pay | Admitting: Neurology

## 2014-04-08 ENCOUNTER — Ambulatory Visit (INDEPENDENT_AMBULATORY_CARE_PROVIDER_SITE_OTHER): Payer: Medicare HMO | Admitting: Neurology

## 2014-04-08 VITALS — BP 144/76 | HR 64 | Temp 96.2°F | Ht 63.0 in | Wt 213.0 lb

## 2014-04-08 DIAGNOSIS — E669 Obesity, unspecified: Secondary | ICD-10-CM

## 2014-04-08 DIAGNOSIS — G4733 Obstructive sleep apnea (adult) (pediatric): Secondary | ICD-10-CM

## 2014-04-08 DIAGNOSIS — Z9989 Dependence on other enabling machines and devices: Secondary | ICD-10-CM

## 2014-04-08 NOTE — Patient Instructions (Signed)
Please continue using your CPAP regularly. While your insurance requires that you use CPAP at least 4 hours each night on 70% of the nights, I recommend, that you not skip any nights and use it throughout the night if you can. Getting used to CPAP and staying with the treatment long term does take time and patience and discipline. Untreated obstructive sleep apnea when it is moderate to severe can have an adverse impact on cardiovascular health and raise her risk for heart disease, arrhythmias, hypertension, congestive heart failure, stroke and diabetes. Untreated obstructive sleep apnea causes sleep disruption, nonrestorative sleep, and sleep deprivation. This can have an impact on your day to day functioning and cause daytime sleepiness and impairment of cognitive function, memory loss, mood disturbance, and problems focussing. Using CPAP regularly can improve these symptoms.  Keep up the good work! I will see you back in 6 months for sleep apnea check up, and if you continue to do well on CPAP I will see you once a year thereafter.   Take your CPAP with you for your brain MRI test as you may need to use it in recovery.   Continue to try to lose weight.   Drink more water.

## 2014-04-08 NOTE — Progress Notes (Signed)
Subjective:    Patient ID: Erika Kim is a 68 y.o. female.  HPI    Interim history:   Erika Kim is a very pleasant 68 year old right-handed woman with an underlying medical history of hyperlipidemia, acoustic neuroma, hypertension, DM, recurrent headaches, positional vertigo, left shoulder pain, and sinus congestion, status post sinus surgery, left knee surgery, tubal ligation, right rotator cuff surgery, right total knee replacement surgery, right great toe surgery, who presents for followup consultation of her sleep apnea. She is unaccompanied today. I last saw her on 10/09/2013, at which time we discussed her recent baseline sleep study which confirmed mild to moderate sleep apnea. I felt that because of her underlying sleep related complaints and her medical history she would benefit from treatment with CPAP and suggested a repeat sleep study with CPAP titration. She had the study on 01/26/2014 and I went over her test results with her in detail today. Sleep efficiency was markedly reduced at 47.1% with a long latency to sleep of 48.5 minutes and wake after sleep onset of 165 minutes with moderate sleep fragmentation noted. She had an increased percentage of slow-wave sleep and a normal percentage of REM sleep with a prolonged REM latency. She had no significant periodic leg movements of sleep and no significant EKG changes. She had a baseline oxygen saturation of 93% with a nadir of 84%. Snoring was eliminated with CPAP which was started at 5 cm and titrated to 8 cm with elimination of her sleep disordered breathing and her snoring. REM sleep was achieved supine REM sleep was not achieved. Based on her test results I prescribed CPAP for her.  Today, I reviewed the patient's CPAP compliance data from 03/08/2014 to 04/06/2014, which is a total of 30 days, during which time the patient used CPAP every day. The average usage for all days was 7 hours and 21 minutes. The percent used days greater than  4 hours was 100 %, indicating superb compliance. The residual AHI was low at 1.5 per hour, indicating an appropriate treatment pressure of 8 cwp with EPR of 2. Air leak from the mask was low with the 95th percentile at 5.4 L per minute.  Today, she reports, that she has adjusted fairly well to the CPAP and feels, she does not wake up as much and endorses sleeping more deeply. She has a routine follow-up for her acoustic neuroma at [facility] in July and will be under anesthesia and is wondering if she should be on CPAP. She does report having a hard time sleeping away from home. She took her CPAP to her trip to the beach. She has been placed on diabetes medication. She goes to the gym 3 times a week and is trying to lose weight. She has lost a couple of pounds. Her memory is stable, she feels.   I first met her on 08/08/2013 at which time she complained of chronic poor quality sleep, snoring, and recent onset of memory problems, as well as witnessed apneas.   She had a baseline sleep study on 09/04/2013. Her sleep efficiency was reduced at 66.1% with a prolonged latency to sleep of 34.5 minutes. Wake after sleep onset was 91 minutes with moderate sleep fragmentation noted. She had an increased percentage of stage I and stage II sleep, and near normal percentage of deep sleep and a decreased percentage of REM sleep at 13.3% with a high normal REM latency of 117 minutes. She had no significant periodic leg movements of sleep. She had  moderate to at times loud snoring. She had a total AHI of 6.4 per hour with further elevation to 20.9 per hour in the supine position. Her baseline oxygen saturation was 93%, her nadir was 88%. She spent 10 minutes and 14 seconds below the saturation of 90% for the night.  We called her with the test results in the interim and asked her to consider returning for CPAP titration study and she reported that she would like to discuss this with her husband first, which is why I saw her back  in follow up.  Her Past Medical History Is Significant For: Past Medical History  Diagnosis Date  . Benign paroxysmal positional vertigo   . Follow-up examination following completed treatment with high-risk medications, not elsewhere classified   . Obesity, unspecified   . Type II or unspecified type diabetes mellitus without mention of complication, not stated as uncontrolled   . Other and unspecified hyperlipidemia   . Unspecified essential hypertension   . Persistent disorder of initiating or maintaining sleep   . Routine general medical examination at a health care facility 08/06/2013  . Snoring 08/08/2013  . Obesity 08/08/2013    Her Past Surgical History Is Significant For: Past Surgical History  Procedure Laterality Date  . Nasal sinus surgery  3/97  . Knee surgery Left 1993  . Shoulder surgery Right 7/05  . Total knee arthroplasty Right 3/11  . Toe surgery Right 2012    Great toe    Her Family History Is Significant For: Family History  Problem Relation Age of Onset  . Heart failure Mother   . Heart failure Father     Her Social History Is Significant For: History   Social History  . Marital Status: Married    Spouse Name: N/A    Number of Children: N/A  . Years of Education: N/A   Social History Main Topics  . Smoking status: Never Smoker   . Smokeless tobacco: None  . Alcohol Use: No  . Drug Use: No  . Sexual Activity: None   Other Topics Concern  . None   Social History Narrative   Married w/2 children (boy and girl).  Retired.    Her Allergies Are:  Allergies  Allergen Reactions  . Augmentin [Amoxicillin-Pot Clavulanate] Itching  :   Her Current Medications Are:  Outpatient Encounter Prescriptions as of 04/08/2014  Medication Sig  . aspirin 325 MG EC tablet Take 325 mg by mouth daily.  Marland Kitchen atorvastatin (LIPITOR) 40 MG tablet Take 1 tablet by mouth daily.  . calcium carbonate (OS-CAL) 600 MG TABS tablet Take 600 mg by mouth daily with  breakfast.  . cetirizine (ZYRTEC) 10 MG tablet Take 10 mg by mouth daily.  . Cholecalciferol 5000 UNITS TABS Take 1 tablet by mouth daily.  . fish oil-omega-3 fatty acids 1000 MG capsule Take 2 g by mouth daily.  Marland Kitchen guaiFENesin (MUCINEX) 600 MG 12 hr tablet Take 600 mg by mouth 2 (two) times daily.  Marland Kitchen ipratropium (ATROVENT) 0.03 % nasal spray Place 2 sprays into the nose every 12 (twelve) hours.  Marland Kitchen JANUVIA 100 MG tablet Take 1 tablet by mouth daily after supper.  Marland Kitchen lisinopril-hydrochlorothiazide (PRINZIDE,ZESTORETIC) 20-12.5 MG per tablet Take 1 tablet by mouth daily.  . metoprolol succinate (TOPROL-XL) 50 MG 24 hr tablet Take 1 tablet by mouth 2 (two) times daily.  . Multiple Vitamin (MULTIVITAMIN) tablet Take 1 tablet by mouth daily.  Marland Kitchen zoster vaccine live, PF, (ZOSTAVAX) 10315 UNT/0.65ML injection Inject  0.65 mLs into the skin once.  . [DISCONTINUED] Cholecalciferol (VITAMIN D3) 2000 UNITS TABS Take 2 capsules by mouth daily.  . [DISCONTINUED] zolpidem (AMBIEN) 5 MG tablet Take 1 tablet by mouth at bedtime as needed and may repeat dose one time if needed.  :  Review of Systems:  Out of a complete 14 point review of systems, all are reviewed and negative with the exception of these symptoms as listed below:  Review of Systems  Constitutional: Negative.   HENT: Negative.   Eyes: Negative.   Respiratory: Negative.   Cardiovascular: Negative.   Gastrointestinal: Negative.   Endocrine: Negative.   Genitourinary: Negative.   Musculoskeletal: Negative.   Skin: Negative.   Allergic/Immunologic: Negative.   Neurological: Negative.   Hematological: Negative.   Psychiatric/Behavioral: Negative.   All other systems reviewed and are negative.   Objective:  Neurologic Exam  Physical Exam Physical Examination:   Filed Vitals:   04/08/14 0857  BP: 144/76  Pulse: 64  Temp: 96.2 F (35.7 C)   General Examination: The patient is a very pleasant 68 y.o. female in no acute distress. She  appears well-developed and well-nourished and well groomed. She is overweight.   HEENT: Normocephalic, atraumatic, pupils are equal, round and reactive to light and accommodation. Funduscopic exam is normal with sharp disc margins noted. Extraocular tracking is good without limitation to gaze excursion or nystagmus noted. Normal smooth pursuit is noted. Hearing is grossly intact. Face is symmetric with normal facial animation and normal facial sensation. Speech is clear with no dysarthria noted. There is no hypophonia. Her speech is slightly nasal sounding. There is no pharyngeal erythema. There is no lip, neck/head, jaw or voice tremor. Neck is supple with full range of passive and active motion. There are no carotid bruits on auscultation. Oropharynx exam reveals: mild mouth dryness, adequate dental hygiene and mild airway crowding, due to narrow airway entry and floppy soft palate. Mallampati is class II. Tongue protrudes centrally and palate elevates symmetrically. Tonsils are absent.   Chest: Clear to auscultation without wheezing, rhonchi or crackles noted.  Heart: S1+S2+0, regular and normal without murmurs, rubs or gallops noted.   Abdomen: Soft, non-tender and non-distended with normal bowel sounds appreciated on auscultation. Abdomen in obese.  Extremities: There is trace pitting edema in the distal lower extremities bilaterally. Pedal pulses are intact.  Skin: Warm and dry without trophic changes noted. There are no varicose veins.  Musculoskeletal: exam reveals no obvious joint deformities, tenderness or joint swelling or erythema.   Neurologically:  Mental status: The patient is awake, alert and oriented in all 4 spheres. Her memory, attention, language and knowledge are appropriate. There is no aphasia, agnosia, apraxia or anomia. Speech is clear with normal prosody and enunciation. Thought process is linear. Mood is congruent and affect is normal.  Cranial nerves are as described  above under HEENT exam. In addition, shoulder shrug is normal with equal shoulder height noted. Motor exam: Normal bulk, strength and tone is noted. There is no drift, tremor or rebound. Romberg is negative. Reflexes are 1+ in UEs, and trace in the LEs. Fine motor skills are intact with normal finger taps, normal hand movements, normal rapid alternating patting, normal foot taps and normal foot agility.  Cerebellar testing shows no dysmetria or intention tremor on finger to nose testing. There is no truncal or gait ataxia.  Sensory exam is intact to light touch, pinprick, vibration, temperature sense in the upper and lower extremities.  Gait, station and  balance are unremarkable, with the exception that she stands up slowly. No veering to one side is noted. No leaning to one side is noted. Posture is age-appropriate and stance is narrow based. No problems turning are noted. She turns en bloc. Tandem walk is slightly difficult for her.               Assessment and Plan:   In summary, Erika Kim is a very pleasant 68 y.o.-year old female with an underlying medical history of hyperlipidemia, acoustic neuroma, hypertension, DM, recurrent headaches, positional vertigo, left shoulder pain, and sinus congestion, status post sinus surgery, left knee surgery, tubal ligation, right rotator cuff surgery, right total knee replacement surgery, right great toe surgery, who presents for followup consultation of her sleep apnea. She is now established on CPAP therapy at 8 cm of water pressure. She had a baseline sleep study in November 2014 and a CPAP titration study in April 2015. We talked about her study results in detail today. She has established great compliance with CPAP therapy and indicates improvement of her sleep related issues. She feels her memory function is stable and I explained to her that patients with subjective complaints of memory loss often feel better after the first 3 months of being on CPAP.  There is a good chance that she will feel improved in her memory function down the [place]. I commended her for her great CPAP therapy compliance and encouraged her to continue using CPAP regularly to help reduce her cardiovascular risks but also to continue to help with her sleep related symptoms. Her physical exam is stable.  We also talked about trying to maintaining a healthy lifestyle in general. I encouraged the patient to eat healthy, exercise daily and keep well hydrated, to keep a scheduled bedtime and wake time routine, to not skip any meals and eat healthy snacks in between meals and to have protein with every meal. I stressed the importance of regular exercise.   I answered all her questions today and the patient was in agreement with the above outlined plan. I would like to see the patient back in 6 months, sooner if the need arises and encouraged her to call with any interim questions, concerns, problems or updates.

## 2014-06-25 ENCOUNTER — Other Ambulatory Visit: Payer: Self-pay

## 2014-06-25 DIAGNOSIS — Z1231 Encounter for screening mammogram for malignant neoplasm of breast: Secondary | ICD-10-CM

## 2014-08-06 ENCOUNTER — Ambulatory Visit
Admission: RE | Admit: 2014-08-06 | Discharge: 2014-08-06 | Disposition: A | Discharge status: Home / Self Care | Payer: Medicare HMO | Source: Ambulatory Visit

## 2014-08-06 DIAGNOSIS — Z1231 Encounter for screening mammogram for malignant neoplasm of breast: Secondary | ICD-10-CM

## 2014-10-05 ENCOUNTER — Encounter: Payer: Self-pay | Admitting: Neurology

## 2014-10-08 ENCOUNTER — Ambulatory Visit (INDEPENDENT_AMBULATORY_CARE_PROVIDER_SITE_OTHER): Payer: Medicare HMO | Admitting: Neurology

## 2014-10-08 ENCOUNTER — Encounter: Payer: Self-pay | Admitting: Neurology

## 2014-10-08 VITALS — BP 129/58 | HR 56 | Temp 97.5°F | Ht 63.0 in | Wt 206.0 lb

## 2014-10-08 DIAGNOSIS — E669 Obesity, unspecified: Secondary | ICD-10-CM

## 2014-10-08 DIAGNOSIS — Z9989 Dependence on other enabling machines and devices: Secondary | ICD-10-CM

## 2014-10-08 DIAGNOSIS — G4733 Obstructive sleep apnea (adult) (pediatric): Secondary | ICD-10-CM

## 2014-10-08 NOTE — Progress Notes (Signed)
Subjective:    Patient ID: Erika Kim is a 68 y.o. female.  HPI     Interim history:   Erika Kim is a very pleasant 68 year old right-handed woman with an underlying medical history of hyperlipidemia, acoustic neuroma, hypertension, DM, recurrent headaches, positional vertigo, left shoulder pain, and sinus congestion, status post sinus surgery, left knee surgery, tubal ligation, right rotator cuff surgery, right total knee replacement surgery, right great toe surgery, who presents for followup consultation of her sleep apnea. She is unaccompanied today. I last saw her on 04/08/2014, at which time she reported adjusting well to CPAP and endorse sleeping deeply and without is many interruptions. She fell to memory was stable. She had started her diabetes medication.  Today, I reviewed her compliance data from 09/05/2014 to 10/04/2014 which is a total of 30 days during which time she used her machine every night with percent used days greater than 4 hours of 100%, indicating superb compliance. Average usage of 7 hours and 9 minutes, residual AHI low at 1.8 per hour and leak low at 6 L/m for the 95th percentile with the pressure currently at 8 cm with EPR of 2.  Today, she reports doing well. She had recently had her brain MRI for her acoustic neuroma and was told she was stable. She has lost a few pounds. She is faithful with her CPAP. She has no new complaints. She has good blood sugar values.   I saw her on 10/09/2013, at which time we discussed her recent baseline sleep study which confirmed mild to moderate sleep apnea. I felt that because of her underlying sleep related complaints and her medical history she would benefit from treatment with CPAP and suggested a repeat sleep study with CPAP titration. She had the study on 01/26/2014 and I went over her test results with her in detail today. Sleep efficiency was markedly reduced at 47.1% with a long latency to sleep of 48.5 minutes and wake  after sleep onset of 165 minutes with moderate sleep fragmentation noted. She had an increased percentage of slow-wave sleep and a normal percentage of REM sleep with a prolonged REM latency. She had no significant periodic leg movements of sleep and no significant EKG changes. She had a baseline oxygen saturation of 93% with a nadir of 84%. Snoring was eliminated with CPAP which was started at 5 cm and titrated to 8 cm with elimination of her sleep disordered breathing and her snoring. REM sleep was achieved supine REM sleep was not achieved. Based on her test results I prescribed CPAP for her.  I reviewed the patient's CPAP compliance data from 03/08/2014 to 04/06/2014, which is a total of 30 days, during which time the patient used CPAP every day. The average usage for all days was 7 hours and 21 minutes. The percent used days greater than 4 hours was 100 %, indicating superb compliance. The residual AHI was low at 1.5 per hour, indicating an appropriate treatment pressure of 8 cwp with EPR of 2. Air leak from the mask was low with the 95th percentile at 5.4 L per minute.  I first met her on 08/08/2013 at which time she complained of chronic poor quality sleep, snoring, and recent onset of memory problems, as well as witnessed apneas.   She had a baseline sleep study on 09/04/2013. Her sleep efficiency was reduced at 66.1% with a prolonged latency to sleep of 34.5 minutes. Wake after sleep onset was 91 minutes with moderate sleep fragmentation noted.  She had an increased percentage of stage I and stage II sleep, and near normal percentage of deep sleep and a decreased percentage of REM sleep at 13.3% with a high normal REM latency of 117 minutes. She had no significant periodic leg movements of sleep. She had moderate to at times loud snoring. She had a total AHI of 6.4 per hour with further elevation to 20.9 per hour in the supine position. Her baseline oxygen saturation was 93%, her nadir was 88%. She  spent 10 minutes and 14 seconds below the saturation of 90% for the night.   We called her with the test results in the interim and asked her to consider returning for CPAP titration study and she reported that she would like to discuss this with her husband first, which is why I saw her back in follow up.    Her Past Medical History Is Significant For: Past Medical History  Diagnosis Date  . Benign paroxysmal positional vertigo   . Follow-up examination following completed treatment with high-risk medications, not elsewhere classified   . Obesity, unspecified   . Type II or unspecified type diabetes mellitus without mention of complication, not stated as uncontrolled   . Other and unspecified hyperlipidemia   . Unspecified essential hypertension   . Persistent disorder of initiating or maintaining sleep   . Routine general medical examination at a health care facility 08/06/2013  . Snoring 08/08/2013  . Obesity 08/08/2013    Her Past Surgical History Is Significant For: Past Surgical History  Procedure Laterality Date  . Nasal sinus surgery  3/97  . Knee surgery Left 1993  . Shoulder surgery Right 7/05  . Total knee arthroplasty Right 3/11  . Toe surgery Right 2012    Great toe    Her Family History Is Significant For: Family History  Problem Relation Age of Onset  . Heart failure Mother   . Heart failure Father     Her Social History Is Significant For: History   Social History  . Marital Status: Married    Spouse Name: N/A    Number of Children: N/A  . Years of Education: N/A   Social History Main Topics  . Smoking status: Never Smoker   . Smokeless tobacco: None  . Alcohol Use: No  . Drug Use: No  . Sexual Activity: None   Other Topics Concern  . None   Social History Narrative   Married w/2 children (boy and girl).  Retired.    Her Allergies Are:  Allergies  Allergen Reactions  . Augmentin [Amoxicillin-Pot Clavulanate] Itching  :   Her Current  Medications Are:  Outpatient Encounter Prescriptions as of 10/08/2014  Medication Sig  . aspirin 325 MG EC tablet Take 325 mg by mouth daily.  Marland Kitchen atorvastatin (LIPITOR) 40 MG tablet Take 1 tablet by mouth daily.  . calcium carbonate (OS-CAL) 600 MG TABS tablet Take 600 mg by mouth daily with breakfast.  . Cholecalciferol 5000 UNITS TABS Take 1 tablet by mouth daily.  . fish oil-omega-3 fatty acids 1000 MG capsule Take 2 g by mouth daily.  Marland Kitchen guaiFENesin (MUCINEX) 600 MG 12 hr tablet Take 600 mg by mouth 2 (two) times daily. As needed  . ipratropium (ATROVENT) 0.03 % nasal spray Place 2 sprays into the nose every 12 (twelve) hours.  Marland Kitchen JANUVIA 100 MG tablet Take 1 tablet by mouth daily after supper.  Marland Kitchen lisinopril-hydrochlorothiazide (PRINZIDE,ZESTORETIC) 20-12.5 MG per tablet Take 1 tablet by mouth daily.  Marland Kitchen  metoprolol succinate (TOPROL-XL) 50 MG 24 hr tablet Take 1 tablet by mouth 2 (two) times daily.  . Multiple Vitamin (MULTIVITAMIN) tablet Take 1 tablet by mouth daily.  Marland Kitchen zoster vaccine live, PF, (ZOSTAVAX) 54562 UNT/0.65ML injection Inject 0.65 mLs into the skin once.  . cetirizine (ZYRTEC) 10 MG tablet Take 10 mg by mouth daily. As needed  :  Review of Systems:  Out of a complete 14 point review of systems, all are reviewed and negative with the exception of these symptoms as listed below:   Review of Systems  All other systems reviewed and are negative.   Objective:  Neurologic Exam  Physical Exam Physical Examination:   Filed Vitals:   10/08/14 0817  BP: 129/58  Pulse: 56  Temp: 97.5 F (36.4 C)   General Examination: The patient is a very pleasant 68 y.o. female in no acute distress. She appears well-developed and well-nourished and well groomed. She is overweight.   HEENT: Normocephalic, atraumatic, pupils are equal, round and reactive to light and accommodation. Funduscopic exam is normal with sharp disc margins noted. Extraocular tracking is good without limitation to  gaze excursion or nystagmus noted. Normal smooth pursuit is noted. Hearing is grossly intact. Face is symmetric with normal facial animation and normal facial sensation. Speech is clear with no dysarthria noted. There is no hypophonia. Her speech is slightly nasal sounding. There is no pharyngeal erythema. There is no lip, neck/head, jaw or voice tremor. Neck is supple with full range of passive and active motion. There are no carotid bruits on auscultation. Oropharynx exam reveals: mild mouth dryness, adequate dental hygiene and mild airway crowding, due to narrow airway entry and floppy soft palate. Mallampati is class II. Tongue protrudes centrally and palate elevates symmetrically. Tonsils are absent. She has a slight deviation of her septum to the R.  Chest: Clear to auscultation without wheezing, rhonchi or crackles noted.  Heart: S1+S2+0, regular and normal without murmurs, rubs or gallops noted.   Abdomen: Soft, non-tender and non-distended with normal bowel sounds appreciated on auscultation. Abdomen in obese.  Extremities: There is no pitting edema in the distal lower extremities bilaterally. Pedal pulses are intact.  Skin: Warm and dry without trophic changes noted. There are no varicose veins.  Musculoskeletal: exam reveals no obvious joint deformities, tenderness or joint swelling or erythema.   Neurologically:  Mental status: The patient is awake, alert and oriented in all 4 spheres. Her memory, attention, language and knowledge are appropriate. There is no aphasia, agnosia, apraxia or anomia. Speech is clear with normal prosody and enunciation. Thought process is linear. Mood is congruent and affect is normal.  Cranial nerves are as described above under HEENT exam. In addition, shoulder shrug is normal with equal shoulder height noted. Motor exam: Normal bulk, strength and tone is noted. There is no drift, tremor or rebound. Romberg is negative. Reflexes are 1+ in UEs, and trace in  the LEs. Toes are downgoing. Fine motor skills are intact with normal finger taps, normal hand movements, normal rapid alternating patting, normal foot taps and normal foot agility.  Cerebellar testing shows no dysmetria or intention tremor on finger to nose testing. There is no truncal or gait ataxia.  Sensory exam is intact to light touch, pinprick, vibration, temperature sense in the upper and lower extremities.  Gait, station and balance are unremarkable, with the exception that she stands up slowly. No veering to one side is noted. No leaning to one side is noted. Posture is  age-appropriate and stance is narrow based. No problems turning are noted. She turns en bloc. Tandem walk is slightly difficult for her, unchanged.                Assessment and Plan:   In summary, KALIANNA VERBEKE is a very pleasant 68 year old female with an underlying medical history of hyperlipidemia, acoustic neuroma (followed at [facility]), hypertension, DM, recurrent headaches, positional vertigo, left shoulder pain, and sinus congestion, status post sinus surgery, left knee surgery, tubal ligation, right rotator cuff surgery, right total knee replacement surgery, right great toe surgery, who presents for followup consultation of her sleep apnea, on treatment with CPAP at 8 cm of water pressure. She had a baseline sleep study in November 2014 and a CPAP titration study in April 2015. She has done well. We talked about her compliance data and she has been doing well with CPAP therapy and indicates improvement of her sleep related issues. I commended her for her great CPAP therapy compliance and encouraged her to continue using CPAP regularly to help reduce her cardiovascular risks but also to continue to help with her sleep related symptoms. Her physical exam is stable.  We also talked about trying to maintaining a healthy lifestyle in general. I encouraged the patient to eat healthy, exercise daily and keep well  hydrated, to keep a scheduled bedtime and wake time routine, to not skip any meals and eat healthy snacks in between meals and to have protein with every meal. I stressed the importance of regular exercise. She is congratulated on her weight loss. I will see her back in 12 months, sooner if needed.  I answered all her questions today and the patient was in agreement with the above outlined plan. I encouraged her to call with any interim questions, concerns, problems or updates.

## 2014-10-08 NOTE — Patient Instructions (Addendum)
Please continue using your CPAP regularly. While your insurance requires that you use CPAP at least 4 hours each night on 70% of the nights, I recommend, that you not skip any nights and use it throughout the night if you can. Getting used to CPAP and staying with the treatment long term does take time and patience and discipline. Untreated obstructive sleep apnea when it is moderate to severe can have an adverse impact on cardiovascular health and raise her risk for heart disease, arrhythmias, hypertension, congestive heart failure, stroke and diabetes. Untreated obstructive sleep apnea causes sleep disruption, nonrestorative sleep, and sleep deprivation. This can have an impact on your day to day functioning and cause daytime sleepiness and impairment of cognitive function, memory loss, mood disturbance, and problems focussing. Using CPAP regularly can improve these symptoms. Keep up the good work! I will see you back in 12 months.    

## 2015-02-10 DIAGNOSIS — M549 Dorsalgia, unspecified: Secondary | ICD-10-CM | POA: Insufficient documentation

## 2015-03-04 ENCOUNTER — Ambulatory Visit: Payer: Medicare HMO | Admitting: Podiatry

## 2015-03-11 ENCOUNTER — Ambulatory Visit: Payer: Medicare HMO | Admitting: Podiatry

## 2015-03-18 ENCOUNTER — Ambulatory Visit (INDEPENDENT_AMBULATORY_CARE_PROVIDER_SITE_OTHER): Payer: Medicare HMO | Admitting: Podiatry

## 2015-03-18 ENCOUNTER — Encounter: Payer: Self-pay | Admitting: Podiatry

## 2015-03-18 VITALS — BP 149/72 | HR 65 | Resp 16

## 2015-03-18 DIAGNOSIS — E119 Type 2 diabetes mellitus without complications: Secondary | ICD-10-CM | POA: Diagnosis not present

## 2015-03-18 DIAGNOSIS — Q828 Other specified congenital malformations of skin: Secondary | ICD-10-CM | POA: Diagnosis not present

## 2015-03-18 NOTE — Progress Notes (Signed)
She presents today for follow-up diabetic foot bilateral. She is complaining of a painful lesion sub-fifth metatarsal head of the left foot. She denies any changes in her past medical history medications allergies surgeries or social history.  Objective: Vital signs are stable she is alert and oriented 3 no change in neurovascular status pulses are strongly palpable. Deep tendon reflexes are intact. Muscle strength is intact bilateral. Orthopedic evaluation demonstrates all joints distal to the ankle for range of motion without crepitation. Cutaneous evaluation demonstrates no erythema edema cellulitis drainage or odor. Porokeratotic lesion sub-fifth metatarsal head of the left foot without complication.  Assessment: Diabetes without complications solitary porokeratosis sub-fifth met left.  Plan: Debridement of reactive hyperkeratoses and will follow-up with me in 1 year or sooner if need be.

## 2015-06-24 ENCOUNTER — Other Ambulatory Visit: Payer: Self-pay | Admitting: Obstetrics and Gynecology

## 2015-06-25 LAB — CYTOLOGY - PAP

## 2015-07-08 ENCOUNTER — Other Ambulatory Visit: Payer: Self-pay

## 2015-07-08 DIAGNOSIS — Z1231 Encounter for screening mammogram for malignant neoplasm of breast: Secondary | ICD-10-CM

## 2015-08-05 DIAGNOSIS — R69 Illness, unspecified: Secondary | ICD-10-CM | POA: Diagnosis not present

## 2015-08-10 ENCOUNTER — Ambulatory Visit
Admission: RE | Admit: 2015-08-10 | Discharge: 2015-08-10 | Disposition: A | Discharge status: Home / Self Care | Payer: Medicare HMO | Source: Ambulatory Visit

## 2015-08-10 DIAGNOSIS — R69 Illness, unspecified: Secondary | ICD-10-CM | POA: Diagnosis not present

## 2015-08-10 DIAGNOSIS — Z1231 Encounter for screening mammogram for malignant neoplasm of breast: Secondary | ICD-10-CM

## 2015-08-12 DIAGNOSIS — E875 Hyperkalemia: Secondary | ICD-10-CM | POA: Diagnosis not present

## 2015-08-12 DIAGNOSIS — I10 Essential (primary) hypertension: Secondary | ICD-10-CM | POA: Diagnosis not present

## 2015-08-12 DIAGNOSIS — E1151 Type 2 diabetes mellitus with diabetic peripheral angiopathy without gangrene: Secondary | ICD-10-CM | POA: Diagnosis not present

## 2015-08-12 DIAGNOSIS — Z0000 Encounter for general adult medical examination without abnormal findings: Secondary | ICD-10-CM | POA: Diagnosis not present

## 2015-08-17 ENCOUNTER — Telehealth: Payer: Self-pay

## 2015-08-17 ENCOUNTER — Ambulatory Visit: Payer: Medicare HMO | Admitting: Neurology

## 2015-08-17 NOTE — Telephone Encounter (Signed)
Spoke to patient and advised her that we need reschedule due to Dr. Rexene Alberts having emergency. I was able to reschedule patient.

## 2015-08-19 DIAGNOSIS — D333 Benign neoplasm of cranial nerves: Secondary | ICD-10-CM | POA: Insufficient documentation

## 2015-08-19 DIAGNOSIS — I10 Essential (primary) hypertension: Secondary | ICD-10-CM | POA: Diagnosis not present

## 2015-08-19 DIAGNOSIS — E1151 Type 2 diabetes mellitus with diabetic peripheral angiopathy without gangrene: Secondary | ICD-10-CM | POA: Diagnosis not present

## 2015-08-19 DIAGNOSIS — I739 Peripheral vascular disease, unspecified: Secondary | ICD-10-CM | POA: Diagnosis not present

## 2015-08-19 DIAGNOSIS — Z23 Encounter for immunization: Secondary | ICD-10-CM | POA: Diagnosis not present

## 2015-08-19 DIAGNOSIS — E669 Obesity, unspecified: Secondary | ICD-10-CM | POA: Diagnosis not present

## 2015-08-19 DIAGNOSIS — Z1212 Encounter for screening for malignant neoplasm of rectum: Secondary | ICD-10-CM | POA: Diagnosis not present

## 2015-08-19 DIAGNOSIS — E785 Hyperlipidemia, unspecified: Secondary | ICD-10-CM | POA: Diagnosis not present

## 2015-08-19 DIAGNOSIS — R42 Dizziness and giddiness: Secondary | ICD-10-CM | POA: Diagnosis not present

## 2015-08-19 DIAGNOSIS — Z0000 Encounter for general adult medical examination without abnormal findings: Secondary | ICD-10-CM | POA: Diagnosis not present

## 2015-08-19 DIAGNOSIS — G4733 Obstructive sleep apnea (adult) (pediatric): Secondary | ICD-10-CM | POA: Diagnosis not present

## 2015-08-20 DIAGNOSIS — H5213 Myopia, bilateral: Secondary | ICD-10-CM | POA: Diagnosis not present

## 2015-08-20 DIAGNOSIS — Z0100 Encounter for examination of eyes and vision without abnormal findings: Secondary | ICD-10-CM | POA: Diagnosis not present

## 2015-08-24 DIAGNOSIS — E113291 Type 2 diabetes mellitus with mild nonproliferative diabetic retinopathy without macular edema, right eye: Secondary | ICD-10-CM | POA: Diagnosis not present

## 2015-09-02 ENCOUNTER — Encounter: Payer: Self-pay | Admitting: Neurology

## 2015-09-02 ENCOUNTER — Encounter (INDEPENDENT_AMBULATORY_CARE_PROVIDER_SITE_OTHER): Payer: Self-pay

## 2015-09-02 ENCOUNTER — Ambulatory Visit (INDEPENDENT_AMBULATORY_CARE_PROVIDER_SITE_OTHER): Payer: Medicare HMO | Admitting: Neurology

## 2015-09-02 VITALS — BP 140/71 | HR 53 | Resp 16 | Ht 63.0 in | Wt 211.0 lb

## 2015-09-02 DIAGNOSIS — H9311 Tinnitus, right ear: Secondary | ICD-10-CM | POA: Diagnosis not present

## 2015-09-02 DIAGNOSIS — H9191 Unspecified hearing loss, right ear: Secondary | ICD-10-CM

## 2015-09-02 DIAGNOSIS — Z9989 Dependence on other enabling machines and devices: Secondary | ICD-10-CM

## 2015-09-02 DIAGNOSIS — R42 Dizziness and giddiness: Secondary | ICD-10-CM

## 2015-09-02 DIAGNOSIS — G4733 Obstructive sleep apnea (adult) (pediatric): Secondary | ICD-10-CM

## 2015-09-02 NOTE — Patient Instructions (Signed)
Your headaches are infrequent and it helps to lie down. You can continue to use Aleve.  Your vertigo is infrequent.  Your sleep apnea is well controlled and you are fully compliant with treatment. Good job with that.  Please remember, that vertigo can recur without warning, triggers could be stress, sleep deprivation, dehydration and even taking a bumpy car ride. It can last hours or days. Please change positions slowly and always stay well-hydrated. Physical therapy with particular attention to vestibular rehabilitation can be very helpful. While there is no specific medication that helps with vertigo, some people get relief with as needed use of meclizine. Certain medications can exacerbate vertigo.   Please talk to Dr. Ernesto Rutherford about doing physical therapy for vertigo.   Your exam is non-focal today. Follow up with Dr. Owens Shark as scheduled at [facility], and Dr. Ernesto Rutherford as planned.

## 2015-09-02 NOTE — Progress Notes (Signed)
Subjective:    Patient ID: Erika Kim is a 69 y.o. female.  HPI     Interim history:  Erika Kim is a very pleasant 69 year old right-handed woman with an underlying medical history of hyperlipidemia, acoustic neuroma, hypertension, DM, recurrent headaches, positional vertigo, left shoulder pain, and sinus congestion, status post sinus surgery, left knee surgery, tubal ligation, right rotator cuff surgery, right total knee replacement surgery, right great toe surgery, who presents for a new problem of vertigo, she is referred by her primary care physician as per ENT recommendation. She saw Dr. Ernesto Rutherford on 07/20/2015 who worried about vertiginous migraines. I have seen her previously for sleep apnea on CPAP therapy. I last saw her on 10/08/2014, at which time she was doing well on CPAP therapy with full compliance and great results. She had a recent brain MRI for her acoustic neuroma at the time and her MRI was felt to be stable per her report.  Today, 09/02/2015: She reports that she had 3 episodes of vertigo this year. They usually last 2 days. She has infrequent headaches, these are typically not severe or pounding. She has some nausea with it. She has no significant photophobia but it does help to lie down. She usually does not take medication for headaches but sometimes takes over-the-counter Advil. She is compliant with treatment with CPAP. She is reporting ongoing good results with sleeping with CPAP. In fact, she feels that her headaches improved after she started sleep apnea therapy. She does not currently have any vertigo symptoms. She denies any double vision.  I reviewed her CPAP compliance data from 07/17/2015 through 08/15/2015 which is a total of 30 days during which time she used her machine every day with percent used days greater than 100%, indicating superb compliance with an average usage of 7 hours and 27 minutes, residual AHI low at 1.3 per hour, leak low with the 95th percentile  at 4 L/m on a pressure of 8 cm with EPR of 2.  Previously:   I saw her on 04/08/2014, at which time she reported adjusting well to CPAP and endorse sleeping deeply and without is many interruptions. She felt memory was stable. She had started her diabetes medication.  I reviewed her compliance data from 09/05/2014 to 10/04/2014 which is a total of 30 days during which time she used her machine every night with percent used days greater than 4 hours of 100%, indicating superb compliance. Average usage of 7 hours and 9 minutes, residual AHI low at 1.8 per hour and leak low at 6 L/m for the 95th percentile with the pressure currently at 8 cm with EPR of 2.  I saw her on 10/09/2013, at which time we discussed her recent baseline sleep study which confirmed mild to moderate sleep apnea. I felt that because of her underlying sleep related complaints and her medical history she would benefit from treatment with CPAP and suggested a repeat sleep study with CPAP titration. She had the study on 01/26/2014 and I went over her test results with her in detail today. Sleep efficiency was markedly reduced at 47.1% with a long latency to sleep of 48.5 minutes and wake after sleep onset of 165 minutes with moderate sleep fragmentation noted. She had an increased percentage of slow-wave sleep and a normal percentage of REM sleep with a prolonged REM latency. She had no significant periodic leg movements of sleep and no significant EKG changes. She had a baseline oxygen saturation of 93% with a nadir  of 84%. Snoring was eliminated with CPAP which was started at 5 cm and titrated to 8 cm with elimination of her sleep disordered breathing and her snoring. REM sleep was achieved supine REM sleep was not achieved. Based on her test results I prescribed CPAP for her.  I reviewed the patient's CPAP compliance data from 03/08/2014 to 04/06/2014, which is a total of 30 days, during which time the patient used CPAP every day. The  average usage for all days was 7 hours and 21 minutes. The percent used days greater than 4 hours was 100 %, indicating superb compliance. The residual AHI was low at 1.5 per hour, indicating an appropriate treatment pressure of 8 cwp with EPR of 2. Air leak from the mask was low with the 95th percentile at 5.4 L per minute.  I first met her on 08/08/2013 at which time she complained of chronic poor quality sleep, snoring, and recent onset of memory problems, as well as witnessed apneas.   She had a baseline sleep study on 09/04/2013. Her sleep efficiency was reduced at 66.1% with a prolonged latency to sleep of 34.5 minutes. Wake after sleep onset was 91 minutes with moderate sleep fragmentation noted. She had an increased percentage of stage I and stage II sleep, and near normal percentage of deep sleep and a decreased percentage of REM sleep at 13.3% with a high normal REM latency of 117 minutes. She had no significant periodic leg movements of sleep. She had moderate to at times loud snoring. She had a total AHI of 6.4 per hour with further elevation to 20.9 per hour in the supine position. Her baseline oxygen saturation was 93%, her nadir was 88%. She spent 10 minutes and 14 seconds below the saturation of 90% for the night.   We called her with the test results in the interim and asked her to consider returning for CPAP titration study and she reported that she would like to discuss this with her husband first, which is why I saw her back in follow up.  Her Past Medical History Is Significant For: Past Medical History  Diagnosis Date  . Benign paroxysmal positional vertigo   . Follow-up examination following completed treatment with high-risk medications, not elsewhere classified   . Obesity, unspecified   . Type II or unspecified type diabetes mellitus without mention of complication, not stated as uncontrolled   . Other and unspecified hyperlipidemia   . Unspecified essential hypertension   .  Persistent disorder of initiating or maintaining sleep   . Routine general medical examination at a health care facility 08/06/2013  . Snoring 08/08/2013  . Obesity 08/08/2013    Her Past Surgical History Is Significant For: Past Surgical History  Procedure Laterality Date  . Nasal sinus surgery  3/97  . Knee surgery Left 1993  . Shoulder surgery Right 7/05  . Total knee arthroplasty Right 3/11  . Toe surgery Right 2012    Great toe    Her Family History Is Significant For: Family History  Problem Relation Age of Onset  . Heart failure Mother   . Heart failure Father     Her Social History Is Significant For: Social History   Social History  . Marital Status: Married    Spouse Name: N/A  . Number of Children: N/A  . Years of Education: N/A   Social History Main Topics  . Smoking status: Never Smoker   . Smokeless tobacco: None  . Alcohol Use: No  .  Drug Use: No  . Sexual Activity: Not Asked   Other Topics Concern  . None   Social History Narrative   Married w/2 children (boy and girl).  Retired.    Her Allergies Are:  Allergies  Allergen Reactions  . Augmentin [Amoxicillin-Pot Clavulanate] Itching  :   Her Current Medications Are:  Outpatient Encounter Prescriptions as of 09/02/2015  Medication Sig  . aspirin 325 MG EC tablet Take 325 mg by mouth daily.  Marland Kitchen atorvastatin (LIPITOR) 40 MG tablet Take 1 tablet by mouth daily.  . calcium carbonate (OS-CAL) 600 MG TABS tablet Take 600 mg by mouth daily with breakfast.  . fish oil-omega-3 fatty acids 1000 MG capsule Take 2 g by mouth daily.  Marland Kitchen guaiFENesin (MUCINEX) 600 MG 12 hr tablet Take 600 mg by mouth 2 (two) times daily. As needed  . ipratropium (ATROVENT) 0.03 % nasal spray Place 2 sprays into the nose every 12 (twelve) hours.  Marland Kitchen JANUVIA 100 MG tablet Take 1 tablet by mouth daily after supper.  Marland Kitchen lisinopril-hydrochlorothiazide (PRINZIDE,ZESTORETIC) 20-12.5 MG per tablet Take 1 tablet by mouth daily.  .  metoprolol succinate (TOPROL-XL) 50 MG 24 hr tablet Take 1 tablet by mouth 2 (two) times daily.  . Multiple Vitamin (MULTIVITAMIN) tablet Take 1 tablet by mouth daily.  . [DISCONTINUED] cetirizine (ZYRTEC) 10 MG tablet Take 10 mg by mouth daily. As needed  . [DISCONTINUED] Cholecalciferol 5000 UNITS TABS Take 1 tablet by mouth daily.  . [DISCONTINUED] zoster vaccine live, PF, (ZOSTAVAX) 11914 UNT/0.65ML injection Inject 0.65 mLs into the skin once.   No facility-administered encounter medications on file as of 09/02/2015.  :  Review of Systems:  Out of a complete 14 point review of systems, all are reviewed and negative with the exception of these symptoms as listed below:  Review of Systems  HENT: Positive for drooling, hearing loss and tinnitus.   Musculoskeletal:       Joint pain back pain   Neurological: Positive for dizziness and headaches.       3 episodes of dizziness this year. Patient reports that patient may have visional vertigo.     Objective:  Neurologic Exam  Physical Exam Physical Examination:   Filed Vitals:   09/02/15 0952  BP: 140/71  Pulse: 53  Resp:    General Examination: The patient is a very pleasant 69 y.o. female in no acute distress. She appears well-developed and well-nourished and well groomed. She is overweight. She is in good spirits today.  HEENT: Normocephalic, atraumatic, pupils are equal, round and reactive to light and accommodation. Funduscopic exam is normal with sharp disc margins noted. Extraocular tracking is good without limitation to gaze excursion or nystagmus noted. Normal smooth pursuit is noted. Hearing is grossly intact, reduced on the R and she reports ringing in her right ear. Face is symmetric with normal facial animation and normal facial sensation. Speech is clear with no dysarthria noted. There is no hypophonia. Her speech is slightly nasal sounding. There is no pharyngeal erythema. There is no lip, neck/head, jaw or voice tremor.  Neck is supple with full range of passive and active motion. There are no carotid bruits on auscultation. Oropharynx exam reveals: mild mouth dryness, adequate dental hygiene and mild airway crowding, due to narrow airway entry and floppy soft palate. Mallampati is class II. Tongue protrudes centrally and palate elevates symmetrically. Tonsils are absent.   Chest: Clear to auscultation without wheezing, rhonchi or crackles noted.  Heart: S1+S2+0, regular and normal  without murmurs, rubs or gallops noted.   Abdomen: Soft, non-tender and non-distended with normal bowel sounds appreciated on auscultation. Abdomen in obese.  Extremities: There is no pitting edema in the distal lower extremities bilaterally. Pedal pulses are intact.  Skin: Warm and dry without trophic changes noted. There are no varicose veins.  Musculoskeletal: exam reveals no obvious joint deformities, tenderness or joint swelling or erythema.   Neurologically:  Mental status: The patient is awake, alert and oriented in all 4 spheres. Her memory, attention, language and knowledge are appropriate. There is no aphasia, agnosia, apraxia or anomia. Speech is clear with normal prosody and enunciation. Thought process is linear. Mood is congruent and affect is normal.  Cranial nerves are as described above under HEENT exam. In addition, shoulder shrug is normal with equal shoulder height noted. Motor exam: Normal bulk, strength and tone is noted. There is no drift, tremor or rebound. Romberg is negative. Reflexes are 1+ in UEs, and trace in the LEs. Toes are down. Fine motor skills are intact with normal finger taps, normal hand movements, normal rapid alternating patting, normal foot taps and normal foot agility.  Cerebellar testing shows no dysmetria or intention tremor on finger to nose testing. There is no truncal or gait ataxia.  Sensory exam is intact to light touch in the upper and lower extremities.  Gait, station and balance are  unremarkable, with the exception that she stands up slowly. No veering to one side is noted. No leaning to one side is noted. Posture is age-appropriate and stance is narrow based. No problems turning are noted. She turns en bloc. Tandem walk is good today.   Assessment and Plan:   In summary, Erika Kim is a very pleasant 69 year old female with an underlying medical history of hyperlipidemia, acoustic neuroma (followed by Dr. Owens Shark at [facility]), hypertension, DM, recurrent headaches, positional vertigo, left shoulder pain, and sinus congestion, status post sinus surgery, left knee surgery, tubal ligation, right rotator cuff surgery, right total knee replacement surgery, right great toe surgery, who was referred for her vertigo. She has a long-standing history of vertigo but had 3 spells this year she reports. She is referred by her ENT physician, Dr. Ernesto Rutherford. She has a brain MRI through Dr. Owens Shark at [facility] every 2 years. Her last scan was in December of last year. She has an appointment next year with him for follow-up. She has tinnitus on the right and hearing loss on the right. Today, on examination she is stable. She has no evidence of nystagmus and no symptoms of vertigo. For her sleep apnea she is on CPAP therapy with excellent compliance and good results. Her AHI is 1.5 per hour at this time and she tolerates treatment. She feels better after she started CPAP therapy. Her neurological exam is nonfocal and she is reassured in that regard. She has infrequent headaches. These are typically not severe enough to take medications but she does sometimes take over-the-counter Advil. It helps to lie down. When she has a vertigo attack it lasts typically for 2 days and she does not drive during this time and rests. She is advised that vertigo can recur without warning and that she should always try to stay well hydrated and well rested. Sometimes taking a bumpy car ride can trigger it. She  does admit that in July she was on a long road trip and had an episode then. She is advised to talk to Dr. Ernesto Rutherford about doing physical  therapy for her vertigo. Otherwise, she can continue to follow with Dr. Owens Shark at [facility] for her acoustic neuroma. From my end of things, I her back next year, particularly for sleep apnea follow-up.  I answered all her questions today and the patient was in agreement with the above. I encouraged her to call with any interim questions, concerns, problems or updates. I spent 20 minutes in total face-to-face time with the patient, more than 50% of which was spent in counseling and coordination of care, reviewing test results, reviewing medication and discussing or reviewing the diagnosis of vertigo, migraines, OSA, the prognosis and treatment options.

## 2015-09-06 DIAGNOSIS — G4733 Obstructive sleep apnea (adult) (pediatric): Secondary | ICD-10-CM | POA: Diagnosis not present

## 2015-10-05 ENCOUNTER — Ambulatory Visit: Payer: Medicare HMO | Admitting: Neurology

## 2015-10-05 ENCOUNTER — Ambulatory Visit: Payer: Self-pay | Admitting: Neurology

## 2015-10-12 ENCOUNTER — Ambulatory Visit: Payer: Medicare HMO | Admitting: Neurology

## 2015-12-08 DIAGNOSIS — G4733 Obstructive sleep apnea (adult) (pediatric): Secondary | ICD-10-CM | POA: Diagnosis not present

## 2015-12-27 DIAGNOSIS — G4733 Obstructive sleep apnea (adult) (pediatric): Secondary | ICD-10-CM | POA: Diagnosis not present

## 2016-01-18 DIAGNOSIS — M1711 Unilateral primary osteoarthritis, right knee: Secondary | ICD-10-CM | POA: Diagnosis not present

## 2016-02-08 DIAGNOSIS — R69 Illness, unspecified: Secondary | ICD-10-CM | POA: Diagnosis not present

## 2016-02-17 ENCOUNTER — Encounter (HOSPITAL_COMMUNITY): Payer: Self-pay | Admitting: Emergency Medicine

## 2016-02-17 ENCOUNTER — Emergency Department (HOSPITAL_COMMUNITY): Payer: Medicare HMO

## 2016-02-17 DIAGNOSIS — E669 Obesity, unspecified: Secondary | ICD-10-CM | POA: Diagnosis not present

## 2016-02-17 DIAGNOSIS — I480 Paroxysmal atrial fibrillation: Secondary | ICD-10-CM | POA: Diagnosis not present

## 2016-02-17 DIAGNOSIS — Z8669 Personal history of other diseases of the nervous system and sense organs: Secondary | ICD-10-CM | POA: Insufficient documentation

## 2016-02-17 DIAGNOSIS — E119 Type 2 diabetes mellitus without complications: Secondary | ICD-10-CM | POA: Insufficient documentation

## 2016-02-17 DIAGNOSIS — Z79899 Other long term (current) drug therapy: Secondary | ICD-10-CM | POA: Insufficient documentation

## 2016-02-17 DIAGNOSIS — Z7982 Long term (current) use of aspirin: Secondary | ICD-10-CM | POA: Diagnosis not present

## 2016-02-17 DIAGNOSIS — I10 Essential (primary) hypertension: Secondary | ICD-10-CM | POA: Insufficient documentation

## 2016-02-17 DIAGNOSIS — R002 Palpitations: Secondary | ICD-10-CM | POA: Diagnosis not present

## 2016-02-17 DIAGNOSIS — E785 Hyperlipidemia, unspecified: Secondary | ICD-10-CM | POA: Insufficient documentation

## 2016-02-17 LAB — BASIC METABOLIC PANEL
ANION GAP: 11 (ref 5–15)
BUN: 12 mg/dL (ref 6–20)
CALCIUM: 10 mg/dL (ref 8.9–10.3)
CO2: 27 mmol/L (ref 22–32)
Chloride: 103 mmol/L (ref 101–111)
Creatinine, Ser: 0.68 mg/dL (ref 0.44–1.00)
GFR calc non Af Amer: >60 mL/min (ref >60)
GLUCOSE: 130 mg/dL — AB (ref 65–99)
POTASSIUM: 4.8 mmol/L (ref 3.5–5.1)
Sodium: 141 mmol/L (ref 135–145)

## 2016-02-17 LAB — CBC
HCT: 44.3 % (ref 36.0–46.0)
HEMOGLOBIN: 15 g/dL (ref 12.0–15.0)
MCH: 28.8 pg (ref 26.0–34.0)
MCHC: 33.9 g/dL (ref 30.0–36.0)
MCV: 85.2 fL (ref 78.0–100.0)
PLATELETS: 153 10*3/uL (ref 150–400)
RBC: 5.2 MIL/uL — AB (ref 3.87–5.11)
RDW: 12.6 % (ref 11.5–15.5)
WBC: 7.4 10*3/uL (ref 4.0–10.5)

## 2016-02-17 LAB — I-STAT TROPONIN, ED: TROPONIN I, POC: 0 ng/mL (ref 0.00–0.08)

## 2016-02-17 NOTE — ED Notes (Signed)
Pt. reports palpitations with chest discomfort onset this evening , denies SOB , no nausea or diaphoresis .

## 2016-02-18 ENCOUNTER — Emergency Department (HOSPITAL_COMMUNITY)
Admission: EM | Admit: 2016-02-18 | Discharge: 2016-02-18 | Disposition: A | Discharge status: Home / Self Care | Payer: Medicare HMO | Attending: Emergency Medicine | Admitting: Emergency Medicine

## 2016-02-18 ENCOUNTER — Other Ambulatory Visit: Payer: Self-pay

## 2016-02-18 DIAGNOSIS — I7389 Other specified peripheral vascular diseases: Secondary | ICD-10-CM | POA: Diagnosis not present

## 2016-02-18 DIAGNOSIS — E669 Obesity, unspecified: Secondary | ICD-10-CM | POA: Diagnosis not present

## 2016-02-18 DIAGNOSIS — E1151 Type 2 diabetes mellitus with diabetic peripheral angiopathy without gangrene: Secondary | ICD-10-CM | POA: Diagnosis not present

## 2016-02-18 DIAGNOSIS — I480 Paroxysmal atrial fibrillation: Secondary | ICD-10-CM

## 2016-02-18 DIAGNOSIS — G4733 Obstructive sleep apnea (adult) (pediatric): Secondary | ICD-10-CM | POA: Diagnosis not present

## 2016-02-18 DIAGNOSIS — E785 Hyperlipidemia, unspecified: Secondary | ICD-10-CM | POA: Diagnosis not present

## 2016-02-18 DIAGNOSIS — I10 Essential (primary) hypertension: Secondary | ICD-10-CM | POA: Diagnosis not present

## 2016-02-18 DIAGNOSIS — Z7982 Long term (current) use of aspirin: Secondary | ICD-10-CM | POA: Diagnosis not present

## 2016-02-18 DIAGNOSIS — Z79899 Other long term (current) drug therapy: Secondary | ICD-10-CM | POA: Diagnosis not present

## 2016-02-18 DIAGNOSIS — E119 Type 2 diabetes mellitus without complications: Secondary | ICD-10-CM | POA: Diagnosis not present

## 2016-02-18 DIAGNOSIS — Z8669 Personal history of other diseases of the nervous system and sense organs: Secondary | ICD-10-CM | POA: Diagnosis not present

## 2016-02-18 DIAGNOSIS — Z6838 Body mass index (BMI) 38.0-38.9, adult: Secondary | ICD-10-CM | POA: Diagnosis not present

## 2016-02-18 LAB — I-STAT TROPONIN, ED: TROPONIN I, POC: 0 ng/mL (ref 0.00–0.08)

## 2016-02-18 MED ORDER — RIVAROXABAN (XARELTO) EDUCATION KIT FOR DVT/PE PATIENTS: PACK | Freq: Once | Status: DC

## 2016-02-18 MED ORDER — RIVAROXABAN 15 MG PO TABS: 15.0000 mg | ORAL_TABLET | Freq: Once | ORAL | Status: DC

## 2016-02-18 MED ORDER — RIVAROXABAN 20 MG PO TABS: 20.0000 mg | ORAL_TABLET | Freq: Every day | ORAL | Status: DC

## 2016-02-18 MED ORDER — RIVAROXABAN 20 MG PO TABS
20.0000 mg | ORAL_TABLET | Freq: Once | ORAL | Status: AC
  Administered 2016-02-18: 20 mg via ORAL
  Filled 2016-02-18: qty 1

## 2016-02-18 NOTE — Discharge Instructions (Signed)
Atrial Fibrillation °Atrial fibrillation is a type of heartbeat that is irregular or fast (rapid). If you have this condition, your heart keeps quivering in a weird (chaotic) way. This condition can make it so your heart cannot pump blood normally. Having this condition gives a person more risk for stroke, heart failure, and other heart problems. There are different types of atrial fibrillation. Talk with your doctor to learn about the type that you have. °HOME CARE °· Take over-the-counter and prescription medicines only as told by your doctor. °· If your doctor prescribed a blood-thinning medicine, take it exactly as told. Taking too much of it can cause bleeding. If you do not take enough of it, you will not have the protection that you need against stroke and other problems. °· Do not use any tobacco products. These include cigarettes, chewing tobacco, and e-cigarettes. If you need help quitting, ask your doctor. °· If you have apnea (obstructive sleep apnea), manage it as told by your doctor. °· Do not drink alcohol. °· Do not drink beverages that have caffeine. These include coffee, soda, and tea. °· Maintain a healthy weight. Do not use diet pills unless your doctor says they are safe for you. Diet pills may make heart problems worse. °· Follow diet instructions as told by your doctor. °· Exercise regularly as told by your doctor. °· Keep all follow-up visits as told by your doctor. This is important. °GET HELP IF: °· You notice a change in the speed, rhythm, or strength of your heartbeat. °· You are taking a blood-thinning medicine and you notice more bruising. °· You get tired more easily when you move or exercise. °GET HELP RIGHT AWAY IF: °· You have pain in your chest or your belly (abdomen). °· You have sweating or weakness. °· You feel sick to your stomach (nauseous). °· You notice blood in your throw up (vomit), poop (stool), or pee (urine). °· You are short of breath. °· You suddenly have swollen feet  and ankles. °· You feel dizzy. °· Your suddenly get weak or numb in your face, arms, or legs, especially if it happens on one side of your body. °· You have trouble talking, trouble understanding, or both. °· Your face or your eyelid droops on one side. °These symptoms may be an emergency. Do not wait to see if the symptoms will go away. Get medical help right away. Call your local emergency services (911 in the [place] Do not drive yourself to the hospital. °  °This information is not intended to replace advice given to you by your health care provider. Make sure you discuss any questions you have with your health care provider. °  °Document Released: 07/18/2008 Document Revised: 06/30/2015 Document Reviewed: 02/03/2015 °[employer]vier Interactive Patient Education ©2016 [employer] ° °

## 2016-02-18 NOTE — ED Provider Notes (Signed)
CSN: [id number]     Arrival date & time 02/17/16  2104 History   By signing my name below, I, Forrestine Him, attest that this documentation has been prepared under the direction and in the presence of Adaisha Campise, MD.  Electronically Signed: Forrestine Him, ED Scribe. 02/18/2016. 2:32 AM.   Chief Complaint  Patient presents with  . Palpitations   Patient is a 70 y.o. female presenting with palpitations. The history is provided by the patient. No language interpreter was used.  Palpitations Palpitations quality:  Fast Onset quality:  Insidious Duration:  6 hours Timing:  Intermittent Progression:  Resolved Chronicity:  New Context: not anxiety and not illicit drugs   Relieved by:  None tried Worsened by:  Nothing Ineffective treatments:  None tried Associated symptoms: no chest pain, no chest pressure, no cough, no dizziness, no nausea and no vomiting   Risk factors: no hx of PE     HPI Comments: Erika Kim is a 70 y.o. female with a PMHx of DM and HTN who presents to the Emergency Department complaining of intermittent, ongoing palpitations onset just prior to arrival. Pt states she returned from dinner and went upstairs as normal. She states her heart then began to skip. No aggravating or alleviating factors reported. However, current she is symptom free as she states palpitations resolved while sitting in triage. No OTC medications or home remedies attempted prior to arrival. No recent fever, chills, nausea, vomiting, chest pain, or shortness of breath. She denies any leg swelling. No recent long distance travel. No prior history of same.   PCP: [name], MD    Past Medical History  Diagnosis Date  . Benign paroxysmal positional vertigo   . Follow-up examination following completed treatment with high-risk medications, not elsewhere classified   . Obesity, unspecified   . Type II or unspecified type diabetes mellitus without mention of complication, not stated as  uncontrolled   . Other and unspecified hyperlipidemia   . Unspecified essential hypertension   . Persistent disorder of initiating or maintaining sleep   . Routine general medical examination at a health care facility 08/06/2013  . Snoring 08/08/2013  . Obesity 08/08/2013   Past Surgical History  Procedure Laterality Date  . Nasal sinus surgery  3/97  . Knee surgery Left 1993  . Shoulder surgery Right 7/05  . Total knee arthroplasty Right 3/11  . Toe surgery Right 2012    Great toe   Family History  Problem Relation Age of Onset  . Heart failure Mother   . Heart failure Father    Social History  Substance Use Topics  . Smoking status: Never Smoker   . Smokeless tobacco: None  . Alcohol Use: No   OB History    No data available     Review of Systems  Constitutional: Negative for fever and chills.  Respiratory: Negative for cough.   Cardiovascular: Positive for palpitations. Negative for chest pain.  Gastrointestinal: Negative for nausea and vomiting.  Neurological: Negative for dizziness.  Psychiatric/Behavioral: Negative for confusion.  All other systems reviewed and are negative.     Allergies  Augmentin  Home Medications   Prior to Admission medications   Medication Sig Start Date End Date Taking? Authorizing Provider  aspirin 325 MG EC tablet Take 325 mg by mouth daily.    Historical Provider, MD  atorvastatin (LIPITOR) 40 MG tablet Take 1 tablet by mouth daily. 08/07/13   Historical Provider, MD  calcium carbonate (OS-CAL) 600 MG  TABS tablet Take 600 mg by mouth daily with breakfast.    Historical Provider, MD  fish oil-omega-3 fatty acids 1000 MG capsule Take 2 g by mouth daily.    Historical Provider, MD  guaiFENesin (MUCINEX) 600 MG 12 hr tablet Take 600 mg by mouth 2 (two) times daily. As needed    Historical Provider, MD  ipratropium (ATROVENT) 0.03 % nasal spray Place 2 sprays into the nose every 12 (twelve) hours.    Historical Provider, MD  JANUVIA  100 MG tablet Take 1 tablet by mouth daily after supper. 04/01/14   Historical Provider, MD  lisinopril-hydrochlorothiazide (PRINZIDE,ZESTORETIC) 20-12.5 MG per tablet Take 1 tablet by mouth daily. 08/04/13   Historical Provider, MD  metoprolol succinate (TOPROL-XL) 50 MG 24 hr tablet Take 1 tablet by mouth 2 (two) times daily. 06/18/13   Historical Provider, MD  Multiple Vitamin (MULTIVITAMIN) tablet Take 1 tablet by mouth daily.    Historical Provider, MD   Triage Vitals: BP 157/67 mmHg  Pulse 59  Temp(Src) 98.2 F (36.8 C) (Oral)  Resp 17  Ht 5\' 3"  (1.6 m)  Wt 200 lb (90.719 kg)  BMI 35.44 kg/m2  SpO2 99%   Physical Exam  Constitutional: She is oriented to person, place, and time. She appears well-developed and well-nourished. No distress.  HENT:  Head: Normocephalic and atraumatic.  Mouth/Throat: Oropharynx is clear and moist. No oropharyngeal exudate.  Eyes: EOM are normal. Pupils are equal, round, and reactive to light.  Neck: Normal range of motion.  No bruits  Cardiovascular: Normal rate, regular rhythm and normal heart sounds.   Pulses:      Dorsalis pedis pulses are 2+ on the right side, and 2+ on the left side.  Pulmonary/Chest: Effort normal and breath sounds normal. No stridor. No respiratory distress. She has no wheezes. She has no rales.  Abdominal: Soft. She exhibits no distension. There is no tenderness.  Good bowel sounds   Musculoskeletal: Normal range of motion. She exhibits no edema.  Neurological: She is alert and oriented to person, place, and time.  Skin: Skin is warm and dry.  Psychiatric: She has a normal mood and affect. Judgment normal.  Nursing note and vitals reviewed.   ED Course  Procedures (including critical care time)  DIAGNOSTIC STUDIES: Oxygen Saturation is 96% on RA, adequate by my interpretation.    COORDINATION OF CARE: 2:24 AM- Will order blood work, imaging, and EKG. Discussed treatment plan with pt at bedside and pt agreed to plan.      3:09 AM- Referred to CHADS2 score for A-Fib. Initial score of 2 but with more specification, pt is a score of 4. Recommend start anticoagulant. Will start on Xarelto.  Labs Review Labs Reviewed  BASIC METABOLIC PANEL - Abnormal; Notable for the following:    Glucose, Bld 130 (*)    All other components within normal limits  CBC - Abnormal; Notable for the following:    RBC 5.20 (*)    All other components within normal limits  I-STAT TROPOININ, ED    Imaging Review Dg Chest 2 View  02/17/2016  CLINICAL DATA:  Acute onset of chest discomfort and palpitations. Initial encounter. EXAM: CHEST  2 VIEW COMPARISON:  Chest radiograph performed 06/14/2009 FINDINGS: The lungs are well-aerated and clear. There is no evidence of focal opacification, pleural effusion or pneumothorax. The heart is normal in size; the mediastinal contour is within normal limits. No acute osseous abnormalities are seen. IMPRESSION: No acute cardiopulmonary process seen. Electronically  Signed   By: Garald Balding M.D.   On: 02/17/2016 21:33   I have personally reviewed and evaluated these images and lab results as part of my medical decision-making.   EKG Interpretation None      MDM   Final diagnoses:  None     Filed Vitals:   02/18/16 0119 02/18/16 0128  BP: 157/67 157/67  Pulse: 61 59  Temp:    Resp: 18 17    Results for orders placed or performed during the hospital encounter of [id number]  Basic metabolic panel  Result Value Ref Range   Sodium 141 135 - 145 mmol/L   Potassium 4.8 3.5 - 5.1 mmol/L   Chloride 103 101 - 111 mmol/L   CO2 27 22 - 32 mmol/L   Glucose, Bld 130 (H) 65 - 99 mg/dL   BUN 12 6 - 20 mg/dL   Creatinine, Ser 0.68 0.44 - 1.00 mg/dL   Calcium 10.0 8.9 - 10.3 mg/dL   GFR calc non Af Amer >60 >60 mL/min   GFR calc Af Amer >60 >60 mL/min   Anion gap 11 5 - 15  CBC  Result Value Ref Range   WBC 7.4 4.0 - 10.5 K/uL   RBC 5.20 (H) 3.87 - 5.11 MIL/uL   Hemoglobin 15.0 12.0 -  15.0 g/dL   HCT 44.3 36.0 - 46.0 %   MCV 85.2 78.0 - 100.0 fL   MCH 28.8 26.0 - 34.0 pg   MCHC 33.9 30.0 - 36.0 g/dL   RDW 12.6 11.5 - 15.5 %   Platelets 153 150 - 400 K/uL  I-stat troponin, ED  Result Value Ref Range   Troponin i, poc 0.00 0.00 - 0.08 ng/mL   Comment 3           ED ECG REPORT   Date: 02/18/2016  Rate:114  Rhythm: atrial fibrillation  QRS Axis: normal  Intervals: normal  ST/T Wave abnormalities: normal  Conduction Disutrbances:none  Narrative Interpretation:   Old EKG Reviewed: none available  Repeat EKG  Date: 02/18/2016  Rate: 62  Rhythm: normal sinus rhythm  QRS Axis: normal  Intervals: normal  ST/T Wave abnormalities: normal  Conduction Disutrbances: none  Narrative Interpretation: unremarkable   Medications  rivaroxaban (XARELTO) tablet 20 mg (20 mg Oral Given 02/18/16 0405)   Back in sinus.  Based on Chad2vasc2 score need anticoagulation will start xarelto. Rate too low at this time to start a rate controlling agent.   Patient to follow with Dr. Philip Aspen this am.  Will give referral to cardiology as well.  Strict return precautions given I have personally reviewed the EKG tracing and agree with the computerized printout as noted.  I personally performed the services described in this documentation, which was scribed in my presence. The recorded information has been reviewed and is accurate.      Veatrice Kells, MD 02/18/16 [phone]

## 2016-02-21 ENCOUNTER — Telehealth: Payer: Self-pay | Admitting: Cardiology

## 2016-02-21 DIAGNOSIS — R69 Illness, unspecified: Secondary | ICD-10-CM | POA: Diagnosis not present

## 2016-02-21 NOTE — Telephone Encounter (Signed)
Received records from [facility] for appointment with Dr Marlou Porch on 03/13/16.  Records sent to [facility] Chart Prep for Dr Marlou Porch schedule on 03/13/16.

## 2016-02-24 ENCOUNTER — Ambulatory Visit: Payer: Medicare HMO | Admitting: Cardiovascular Disease

## 2016-02-25 DIAGNOSIS — R69 Illness, unspecified: Secondary | ICD-10-CM | POA: Diagnosis not present

## 2016-03-08 DIAGNOSIS — G4733 Obstructive sleep apnea (adult) (pediatric): Secondary | ICD-10-CM | POA: Diagnosis not present

## 2016-03-13 ENCOUNTER — Ambulatory Visit (INDEPENDENT_AMBULATORY_CARE_PROVIDER_SITE_OTHER): Payer: Medicare HMO | Admitting: Cardiology

## 2016-03-13 ENCOUNTER — Encounter: Payer: Self-pay | Admitting: Cardiology

## 2016-03-13 VITALS — BP 128/78 | HR 72 | Ht 62.5 in | Wt 215.4 lb

## 2016-03-13 DIAGNOSIS — I10 Essential (primary) hypertension: Secondary | ICD-10-CM

## 2016-03-13 DIAGNOSIS — G4733 Obstructive sleep apnea (adult) (pediatric): Secondary | ICD-10-CM

## 2016-03-13 DIAGNOSIS — I480 Paroxysmal atrial fibrillation: Secondary | ICD-10-CM

## 2016-03-13 DIAGNOSIS — E785 Hyperlipidemia, unspecified: Secondary | ICD-10-CM | POA: Diagnosis not present

## 2016-03-13 MED ORDER — RIVAROXABAN 20 MG PO TABS: 20.0000 mg | ORAL_TABLET | Freq: Every day | ORAL | Status: DC

## 2016-03-13 NOTE — Patient Instructions (Addendum)
Medication Instructions:  Please stop your Fish Oil.  Continue all other medications as listed.  Testing/Procedures: Your physician has requested that you have an echocardiogram. Echocardiography is a painless test that uses sound waves to create images of your heart. It provides your doctor with information about the size and shape of your heart and how well your heart's chambers and valves are working. This procedure takes approximately one hour. There are no restrictions for this procedure.  Follow-Up: Follow up in 2 to 3 months with Dr Marlou Porch.  If you need a refill on your cardiac medications before your next appointment, please call your pharmacy.  Thank you for choosing [facility]!!

## 2016-03-13 NOTE — Progress Notes (Signed)
Cardiology Office Note    Date:  03/13/2016   ID:  Erika Kim, Erika Kim 1945-12-22, MRN [id number]  PCP:  [name], MD  Cardiologist:   [name], MD     History of Present Illness:  Erika Kim is a 70 y.o. female here for evaluation of palpitations having previously been seen in the emergency department on 02/18/16. She returned from dinner, went upstairs and her heart began to skip. She felt like her heart was going to jump out of her chest. Lasted one-two hours. Xarelto was started for atrial fibrillation, risk score of 4. Atrial fibrillation was noted on 02/17/16, normal rate. The next day 02/18/16 at 2 AM, she was in sinus rhythm.  Since that emergency room visit, she may have had a few more episodes of atrial fibrillation, usually they last approximately one hour. No prior strokes.  Positive for diabetes, hypertension, female, age greater than 28.  No bleeding issues. Tolerating Xarelto well. Has been on Toprol.  Both grandparents had CVA's. Father with electrical issues with heart. Mother had CP. CHF.   Past Medical History  Diagnosis Date  . Benign paroxysmal positional vertigo   . Follow-up examination following completed treatment with high-risk medications, not elsewhere classified   . Obesity, unspecified   . Type II or unspecified type diabetes mellitus without mention of complication, not stated as uncontrolled   . Other and unspecified hyperlipidemia   . Unspecified essential hypertension   . Persistent disorder of initiating or maintaining sleep   . Routine general medical examination at a health care facility 08/06/2013  . Snoring 08/08/2013  . Obesity 08/08/2013    Past Surgical History  Procedure Laterality Date  . Nasal sinus surgery  3/97  . Knee surgery Left 1993  . Shoulder surgery Right 7/05  . Total knee arthroplasty Right 3/11  . Toe surgery Right 2012    Great toe    Current Medications: Outpatient Prescriptions Prior to Visit    Medication Sig Dispense Refill  . atorvastatin (LIPITOR) 40 MG tablet Take 1 tablet by mouth daily.    . calcium carbonate (OS-CAL) 600 MG TABS tablet Take 600 mg by mouth 2 (two) times daily.     Marland Kitchen ipratropium (ATROVENT) 0.03 % nasal spray Place 2 sprays into the nose 2 (two) times daily as needed for rhinitis.     Marland Kitchen JANUVIA 100 MG tablet Take 1 tablet by mouth daily after supper.    Marland Kitchen ketotifen (ZADITOR) 0.025 % ophthalmic solution Place 1 drop into both eyes 2 (two) times daily as needed (allergies).    Marland Kitchen lisinopril-hydrochlorothiazide (PRINZIDE,ZESTORETIC) 20-12.5 MG per tablet Take 1 tablet by mouth daily.    . metoprolol succinate (TOPROL-XL) 50 MG 24 hr tablet Take 1 tablet by mouth 2 (two) times daily.    . Multiple Vitamin (MULTIVITAMIN) tablet Take 1 tablet by mouth daily.    . saline (AYR) GEL Place 1 application into the nose at bedtime.    Marland Kitchen aspirin 325 MG EC tablet Take 325 mg by mouth daily.    . fish oil-omega-3 fatty acids 1000 MG capsule Take 2 g by mouth daily.    . rivaroxaban (XARELTO) 20 MG TABS tablet Take 1 tablet (20 mg total) by mouth daily with supper. 30 tablet 0  . guaiFENesin (MUCINEX) 600 MG 12 hr tablet Take 600 mg by mouth 2 (two) times daily as needed for to loosen phlegm. As needed     No facility-administered medications prior to  visit.     Allergies:   Augmentin   Social History   Social History  . Marital Status: Married    Spouse Name: N/A  . Number of Children: N/A  . Years of Education: N/A   Social History Main Topics  . Smoking status: Never Smoker   . Smokeless tobacco: None  . Alcohol Use: No  . Drug Use: No  . Sexual Activity: Not Asked   Other Topics Concern  . None   Social History Narrative   Married w/2 children (boy and girl).  Retired.     Family History:  The patient's family history includes Heart failure in her father and mother.   ROS:   Please see the history of present illness.   No recent fevers, chills. No  surgeries. ROS All other systems reviewed and are negative.   PHYSICAL EXAM:   VS:  BP 128/78 mmHg  Pulse 72  Ht 5' 2.5" (1.588 m)  Wt 215 lb 6.4 oz (97.705 kg)  BMI 38.75 kg/m2   GEN: Well nourished, well developed, in no acute distress HEENT: normal Neck: no JVD, carotid bruits, or masses Cardiac: RRR; 2/6 systolic murmur RUSB, no rubs, or gallops,no edema  Respiratory:  clear to auscultation bilaterally, normal work of breathing GI: soft, nontender, nondistended, + BS, obese MS: no deformity or atrophy Skin: warm and dry, no rash Neuro:  Alert and Oriented x 3, Strength and sensation are intact Psych: euthymic mood, full affect  Wt Readings from Last 3 Encounters:  03/13/16 215 lb 6.4 oz (97.705 kg)  02/18/16 200 lb (90.719 kg)  09/02/15 211 lb (95.709 kg)      Studies/Labs Reviewed:   EKG:  None today  Recent Labs: 02/17/2016: BUN 12; Creatinine, Ser 0.68; Hemoglobin 15.0; Platelets 153; Potassium 4.8; Sodium 141   Lipid Panel No results found for: CHOL, TRIG, HDL, CHOLHDL, VLDL, LDLCALC, LDLDIRECT  Additional studies/ records that were reviewed today include:  Prior office note from Dr. Sharlett Iles, lab work reviewed.    ASSESSMENT:    1. Paroxysmal atrial fibrillation (HCC)   2. Obstructive sleep apnea   3. Essential hypertension   4. Hyperlipidemia      PLAN:  In order of problems listed above:  Paroxysmal atrial fibrillation  - Xarelto - CHADS-VASc -4   - Brief episodes, 1-2 hours.  - Take extra Toprol 50 mg if she is having rapid heartbeat. She wears A. fitbit and sometimes this helps her.  - Next step would be antiarrhythmic therapy. She is no history of coronary artery disease. Perhaps a class IA agent would be helpful.  - Discussed weight loss. She wears CPAP for obstructive sleep apnea. Exercise. Decaffeinated coffee. No decongestants.  - Check echocardiogram. Heart murmur heard.  OSA  - CPAP treatment  Obesity  - Weight loss low  carbohydrates  Essential hypertension  - Multidrug regimen  - She has actually been on Toprol for some time.  Hyperlipidemia  - Continue atorvastatin  Diabetes  - Januvia, Dr. Sharlett Iles monitoring closely. O Pam      Medication Adjustments/Labs and Tests Ordered: Current medicines are reviewed at length with the patient today.  Concerns regarding medicines are outlined above.  Medication changes, Labs and Tests ordered today are listed in the Patient Instructions below. Patient Instructions  Medication Instructions:  Please stop your Fish Oil.  Continue all other medications as listed.  Testing/Procedures: Your physician has requested that you have an echocardiogram. Echocardiography is a painless test that uses  sound waves to create images of your heart. It provides your doctor with information about the size and shape of your heart and how well your heart's chambers and valves are working. This procedure takes approximately one hour. There are no restrictions for this procedure.  Follow-Up: Follow up in 2 to 3 months with Dr Marlou Porch.  If you need a refill on your cardiac medications before your next appointment, please call your pharmacy.  Thank you for choosing [facility]!!          Signed, [name], MD  03/13/2016 5:04 PM    [address], [place], [place]  [address] Phone: [phone]; Fax: [phone]

## 2016-03-16 ENCOUNTER — Ambulatory Visit (INDEPENDENT_AMBULATORY_CARE_PROVIDER_SITE_OTHER): Payer: Medicare HMO | Admitting: Podiatry

## 2016-03-16 ENCOUNTER — Encounter: Payer: Self-pay | Admitting: Podiatry

## 2016-03-16 DIAGNOSIS — M779 Enthesopathy, unspecified: Secondary | ICD-10-CM

## 2016-03-16 DIAGNOSIS — Q828 Other specified congenital malformations of skin: Secondary | ICD-10-CM | POA: Diagnosis not present

## 2016-03-16 DIAGNOSIS — E119 Type 2 diabetes mellitus without complications: Secondary | ICD-10-CM

## 2016-03-16 NOTE — Progress Notes (Signed)
She presents today for follow-up of her diabetic feet. She states that her blood sugar is doing much better but she does have a painful lesion beneath the fifth metatarsal of the left foot. She states there is been very painful with ambulation particularly if she is not wearing shoe gear.  Objective: Vital signs are stable alert and oriented 3. Pulses are strongly palpable. Neurologic sensorium is intact. Deep tendon reflexes are intact. Plantarflexed fifth metatarsal of the left foot results as a solitary poor keratoma she also has pain on end range of motion of the fifth metatarsophalangeal joint and fifth toe. No other skin breakdown in her toenails appear to be in good condition.  Assessment: Diabetes mellitus currently under control with capsulitis and porokeratosis fifth metatarsophalangeal joint left foot.  Plan: I injected the fifth metatarsophalangeal joint today with a Tamala Julian is a local anesthetic to alleviate her capsulitis symptoms I also debrided the fifth metatarsophalangeal joint and the porokeratotic lesion in that area. She tolerated this procedure very well and I will follow-up with her on an as-needed basis or in one year for diabetic checkup.

## 2016-03-28 ENCOUNTER — Other Ambulatory Visit: Payer: Self-pay

## 2016-03-28 ENCOUNTER — Ambulatory Visit (HOSPITAL_COMMUNITY): Payer: Medicare HMO | Attending: Cardiology

## 2016-03-28 DIAGNOSIS — I480 Paroxysmal atrial fibrillation: Secondary | ICD-10-CM | POA: Diagnosis not present

## 2016-03-28 DIAGNOSIS — I351 Nonrheumatic aortic (valve) insufficiency: Secondary | ICD-10-CM | POA: Insufficient documentation

## 2016-03-28 DIAGNOSIS — I340 Nonrheumatic mitral (valve) insufficiency: Secondary | ICD-10-CM | POA: Diagnosis not present

## 2016-03-28 DIAGNOSIS — I4891 Unspecified atrial fibrillation: Secondary | ICD-10-CM | POA: Diagnosis present

## 2016-03-28 HISTORY — PX: TRANSTHORACIC ECHOCARDIOGRAM: SHX275

## 2016-04-12 DIAGNOSIS — M1812 Unilateral primary osteoarthritis of first carpometacarpal joint, left hand: Secondary | ICD-10-CM | POA: Diagnosis not present

## 2016-04-19 DIAGNOSIS — L853 Xerosis cutis: Secondary | ICD-10-CM | POA: Diagnosis not present

## 2016-05-13 DIAGNOSIS — R69 Illness, unspecified: Secondary | ICD-10-CM | POA: Diagnosis not present

## 2016-05-22 ENCOUNTER — Encounter: Payer: Self-pay | Admitting: Cardiology

## 2016-05-22 ENCOUNTER — Ambulatory Visit (INDEPENDENT_AMBULATORY_CARE_PROVIDER_SITE_OTHER): Payer: Medicare HMO | Admitting: Cardiology

## 2016-05-22 VITALS — BP 136/84 | HR 64 | Ht 63.0 in | Wt 213.0 lb

## 2016-05-22 DIAGNOSIS — I480 Paroxysmal atrial fibrillation: Secondary | ICD-10-CM | POA: Diagnosis not present

## 2016-05-22 DIAGNOSIS — I10 Essential (primary) hypertension: Secondary | ICD-10-CM

## 2016-05-22 DIAGNOSIS — G4733 Obstructive sleep apnea (adult) (pediatric): Secondary | ICD-10-CM | POA: Diagnosis not present

## 2016-05-22 DIAGNOSIS — E785 Hyperlipidemia, unspecified: Secondary | ICD-10-CM | POA: Diagnosis not present

## 2016-05-22 NOTE — Progress Notes (Signed)
Cardiology Office Note    Date:  05/22/2016   ID:  CALLEE DRUSCHEL, DOB Dec 10, 1945, MRN [id number]  PCP:  Erika Lopes, MD  Cardiologist:   Erika Furbish, MD     History of Present Illness:  Erika Kim is a 70 y.o. female here for evaluation of palpitations having previously been seen in the emergency department on 02/18/16. She returned from dinner, went upstairs and her heart began to skip. She felt like her heart was going to jump out of her chest. Lasted one-two hours. Xarelto was started for atrial fibrillation, risk score of 4. Atrial fibrillation was noted on 02/17/16, normal rate. The next day 02/18/16 at 2 AM, she was in sinus rhythm.  Since that emergency room visit, she may have had a few more episodes of atrial fibrillation, usually they last approximately one hour. No prior strokes.  Positive for diabetes, hypertension, female, age greater than 23.  No bleeding issues. Tolerating Xarelto well. Has been on Toprol.  Both grandparents had CVA's. Father with electrical issues with heart. Mother had CP. CHF.   05/22/16-Felt a skip last week. Rare. Tolerating Xarelto well, no bleeding. Worried about do-nut hole. No chest pain, no shortness of breath. She is excited, just bought a new camper. They're going down to [place] for the winter.  Past Medical History:  Diagnosis Date  . Benign paroxysmal positional vertigo   . Follow-up examination following completed treatment with high-risk medications, not elsewhere classified   . Obesity 08/08/2013  . Obesity, unspecified   . Other and unspecified hyperlipidemia   . Persistent disorder of initiating or maintaining sleep   . Routine general medical examination at a health care facility 08/06/2013  . Snoring 08/08/2013  . Type II or unspecified type diabetes mellitus without mention of complication, not stated as uncontrolled   . Unspecified essential hypertension     Past Surgical History:  Procedure Laterality Date  .  KNEE SURGERY Left 1993  . NASAL SINUS SURGERY  3/97  . SHOULDER SURGERY Right 7/05  . TOE SURGERY Right 2012   Great toe  . TOTAL KNEE ARTHROPLASTY Right 3/11    Current Medications: Outpatient Medications Prior to Visit  Medication Sig Dispense Refill  . acetaminophen (TYLENOL) 500 MG tablet Take 1,000 mg by mouth every 6 (six) hours as needed for mild pain, moderate pain, fever or headache.    Marland Kitchen atorvastatin (LIPITOR) 40 MG tablet Take 1 tablet by mouth daily.    . calcium carbonate (OS-CAL) 600 MG TABS tablet Take 600 mg by mouth 2 (two) times daily.     . cholecalciferol (VITAMIN D) 1000 units tablet Take 2,000 Units by mouth daily.    Marland Kitchen ipratropium (ATROVENT) 0.03 % nasal spray Place 2 sprays into the nose 2 (two) times daily as needed for rhinitis.     Marland Kitchen JANUVIA 100 MG tablet Take 1 tablet by mouth daily after supper.    Marland Kitchen ketotifen (ZADITOR) 0.025 % ophthalmic solution Place 1 drop into both eyes 2 (two) times daily as needed (allergies).    Marland Kitchen lisinopril-hydrochlorothiazide (PRINZIDE,ZESTORETIC) 20-12.5 MG per tablet Take 1 tablet by mouth daily.    . metoprolol succinate (TOPROL-XL) 50 MG 24 hr tablet Take 1 tablet by mouth 2 (two) times daily.    . Multiple Vitamin (MULTIVITAMIN) tablet Take 1 tablet by mouth daily.    . rivaroxaban (XARELTO) 20 MG TABS tablet Take 1 tablet (20 mg total) by mouth daily with supper. 90 tablet 3  .  saline (AYR) GEL Place 1 application into the nose at bedtime.     No facility-administered medications prior to visit.      Allergies:   Augmentin [amoxicillin-pot clavulanate]   Social History   Social History  . Marital status: Married    Spouse name: N/A  . Number of children: N/A  . Years of education: N/A   Social History Main Topics  . Smoking status: Never Smoker  . Smokeless tobacco: None  . Alcohol use No  . Drug use: No  . Sexual activity: Not Asked   Other Topics Concern  . None   Social History Narrative   Married w/2  children (boy and girl).  Retired.     Family History:  The patient's family history includes Heart failure in her father and mother.   ROS:   Please see the history of present illness.   No recent fevers, chills. No surgeries. ROS All other systems reviewed and are negative.   PHYSICAL EXAM:   VS:  BP 136/84   Pulse 64   Ht 5\' 3"  (1.6 m)   Wt 213 lb (96.6 kg)   BMI 37.73 kg/m    GEN: Well nourished, well developed, in no acute distress  HEENT: normal  Neck: no JVD, carotid bruits, or masses Cardiac: RRR; 2/6 systolic murmur RUSB, no rubs, or gallops,no edema  Respiratory:  clear to auscultation bilaterally, normal work of breathing GI: soft, nontender, nondistended, + BS, obese MS: no deformity or atrophy  Skin: warm and dry, no rash Neuro:  Alert and Oriented x 3, Strength and sensation are intact Psych: euthymic mood, full affect  Wt Readings from Last 3 Encounters:  05/22/16 213 lb (96.6 kg)  03/13/16 215 lb 6.4 oz (97.7 kg)  02/18/16 200 lb (90.7 kg)      Studies/Labs Reviewed:   EKG:  None today  Recent Labs: 02/17/2016: BUN 12; Creatinine, Ser 0.68; Hemoglobin 15.0; Platelets 153; Potassium 4.8; Sodium 141   Lipid Panel No results found for: CHOL, TRIG, HDL, CHOLHDL, VLDL, LDLCALC, LDLDIRECT  Additional studies/ records that were reviewed today include:  Prior office note from Dr. Sharlett Iles, lab work reviewed.  ECHO 03/28/16: - Left ventricle: The cavity size was normal. Systolic function was   normal. The estimated ejection fraction was in the range of 60%   to 65%. Wall motion was normal; there were no regional wall   motion abnormalities. The transmitral flow pattern was normal.   Left ventricular diastolic function parameters were normal. - Aortic valve: Moderate focal calcification, consistent with   sclerosis. There was mild regurgitation. - Mitral valve: There was mild regurgitation.    ASSESSMENT:    1. Paroxysmal atrial fibrillation (HCC)     2. Hyperlipidemia   3. Obstructive sleep apnea   4. Essential hypertension      PLAN:  In order of problems listed above:  Paroxysmal atrial fibrillation  - Xarelto - CHADS-VASc -4   - Brief episodes, 1-2 hours.She felt a rare episode last Friday, brief palpitation.  - Take extra Toprol 50 mg if she is having rapid heartbeat. She wears a fitbit and sometimes this helps her.  - Next step would be antiarrhythmic therapy. She is no history of coronary artery disease. Perhaps a class IA agent would be helpful. Overall she is doing well currently.  - Discussed weight loss. She wears CPAP for obstructive sleep apnea. Exercise. Decaffeinated coffee. No decongestants.  - Check echocardiogram. Heart murmur heard.  OSA  -  CPAP treatment  Obesity  - Weight loss low carbohydrates, discussed once again  Essential hypertension  - Multidrug regimen  - She has actually been on Toprol for some time.  Hyperlipidemia  - Continue atorvastatin, no side effects.  Diabetes  - Januvia, Dr. Sharlett Iles monitoring closely. O Pam      Medication Adjustments/Labs and Tests Ordered: Current medicines are reviewed at length with the patient today.  Concerns regarding medicines are outlined above.  Medication changes, Labs and Tests ordered today are listed in the Patient Instructions below. Patient Instructions  Medication Instructions:  The current medical regimen is effective;  continue present plan and medications.  Follow-Up: Follow up in December with Dr Marlou Porch.  If you need a refill on your cardiac medications before your next appointment, please call your pharmacy.  Thank you for choosing [facility]        Signed, Erika Furbish, MD  05/22/2016 2:50 PM    [address], [place], [place]  [id number] Phone: [phone]; Fax: [phone]

## 2016-05-22 NOTE — Patient Instructions (Signed)
Medication Instructions:  The current medical regimen is effective;  continue present plan and medications.  Follow-Up: Follow up in December with Dr Marlou Porch.  If you need a refill on your cardiac medications before your next appointment, please call your pharmacy.  Thank you for choosing [facility]!!

## 2016-06-07 ENCOUNTER — Telehealth: Payer: Self-pay | Admitting: Cardiology

## 2016-06-07 NOTE — Telephone Encounter (Signed)
Pt reports that when she woke this AM husband told her she had blood on her face.  She states it was coming from her tongue but she doesn't know if she bit it or not.  She does have a sore spot on her tongue.  She reports wearing a retainer with her CPAP and it is possible it rubbed her tongue during the night.  She denies any further bleeding during the day today.  No blood noted from nose or gums.  No other signs of bleeding such as dark tarry stools etc.  Advised pt that since she hasn't had any further bleeding to continue on current medications.  Requested she continue to monitor and call back if any further indication of bleeding.  She states understanding.

## 2016-06-07 NOTE — Telephone Encounter (Signed)
New Message  Pt c/o medication issue:  1. Name of Medication: Xarelto  2. How are you currently taking this medication (dosage and times per day)? 20mg   3. Are you having a reaction (difficulty breathing--STAT)? Pt states after waking up this morning she noticed blood around mouth. Pt did not know if it was because of the med or not. Pt states her tongue was bleeding   4. What is your medication issue? Pt would like to speak with RN about concern. Please call back to discuss

## 2016-06-08 NOTE — Telephone Encounter (Signed)
Agree with monitoring [name], MD

## 2016-06-09 DIAGNOSIS — G4733 Obstructive sleep apnea (adult) (pediatric): Secondary | ICD-10-CM | POA: Diagnosis not present

## 2016-06-28 DIAGNOSIS — G4733 Obstructive sleep apnea (adult) (pediatric): Secondary | ICD-10-CM | POA: Diagnosis not present

## 2016-06-29 DIAGNOSIS — Z01419 Encounter for gynecological examination (general) (routine) without abnormal findings: Secondary | ICD-10-CM | POA: Diagnosis not present

## 2016-06-29 DIAGNOSIS — I10 Essential (primary) hypertension: Secondary | ICD-10-CM | POA: Diagnosis not present

## 2016-06-29 DIAGNOSIS — I480 Paroxysmal atrial fibrillation: Secondary | ICD-10-CM | POA: Diagnosis not present

## 2016-06-29 DIAGNOSIS — R519 Headache, unspecified: Secondary | ICD-10-CM | POA: Insufficient documentation

## 2016-06-29 DIAGNOSIS — Z23 Encounter for immunization: Secondary | ICD-10-CM | POA: Diagnosis not present

## 2016-06-29 DIAGNOSIS — R51 Headache: Secondary | ICD-10-CM | POA: Diagnosis not present

## 2016-06-29 DIAGNOSIS — Z6838 Body mass index (BMI) 38.0-38.9, adult: Secondary | ICD-10-CM | POA: Diagnosis not present

## 2016-06-29 DIAGNOSIS — H04122 Dry eye syndrome of left lacrimal gland: Secondary | ICD-10-CM | POA: Diagnosis not present

## 2016-06-29 DIAGNOSIS — G4733 Obstructive sleep apnea (adult) (pediatric): Secondary | ICD-10-CM | POA: Diagnosis not present

## 2016-06-29 DIAGNOSIS — H04129 Dry eye syndrome of unspecified lacrimal gland: Secondary | ICD-10-CM | POA: Insufficient documentation

## 2016-06-29 DIAGNOSIS — E784 Other hyperlipidemia: Secondary | ICD-10-CM | POA: Diagnosis not present

## 2016-06-29 DIAGNOSIS — Z1389 Encounter for screening for other disorder: Secondary | ICD-10-CM | POA: Diagnosis not present

## 2016-06-29 DIAGNOSIS — E1151 Type 2 diabetes mellitus with diabetic peripheral angiopathy without gangrene: Secondary | ICD-10-CM | POA: Diagnosis not present

## 2016-07-10 ENCOUNTER — Other Ambulatory Visit: Payer: Self-pay | Admitting: Obstetrics and Gynecology

## 2016-07-10 DIAGNOSIS — Z1231 Encounter for screening mammogram for malignant neoplasm of breast: Secondary | ICD-10-CM

## 2016-08-07 DIAGNOSIS — I10 Essential (primary) hypertension: Secondary | ICD-10-CM | POA: Diagnosis not present

## 2016-08-07 DIAGNOSIS — E784 Other hyperlipidemia: Secondary | ICD-10-CM | POA: Diagnosis not present

## 2016-08-07 DIAGNOSIS — E1151 Type 2 diabetes mellitus with diabetic peripheral angiopathy without gangrene: Secondary | ICD-10-CM | POA: Diagnosis not present

## 2016-08-08 DIAGNOSIS — R69 Illness, unspecified: Secondary | ICD-10-CM | POA: Diagnosis not present

## 2016-08-17 ENCOUNTER — Ambulatory Visit
Admission: RE | Admit: 2016-08-17 | Discharge: 2016-08-17 | Disposition: A | Discharge status: Home / Self Care | Payer: Medicare HMO | Source: Ambulatory Visit | Attending: Obstetrics and Gynecology | Admitting: Obstetrics and Gynecology

## 2016-08-17 DIAGNOSIS — Z0000 Encounter for general adult medical examination without abnormal findings: Secondary | ICD-10-CM | POA: Diagnosis not present

## 2016-08-17 DIAGNOSIS — E784 Other hyperlipidemia: Secondary | ICD-10-CM | POA: Diagnosis not present

## 2016-08-17 DIAGNOSIS — Z1231 Encounter for screening mammogram for malignant neoplasm of breast: Secondary | ICD-10-CM

## 2016-08-17 DIAGNOSIS — Z6838 Body mass index (BMI) 38.0-38.9, adult: Secondary | ICD-10-CM | POA: Diagnosis not present

## 2016-08-17 DIAGNOSIS — I10 Essential (primary) hypertension: Secondary | ICD-10-CM | POA: Diagnosis not present

## 2016-08-17 DIAGNOSIS — D333 Benign neoplasm of cranial nerves: Secondary | ICD-10-CM | POA: Diagnosis not present

## 2016-08-17 DIAGNOSIS — I480 Paroxysmal atrial fibrillation: Secondary | ICD-10-CM | POA: Diagnosis not present

## 2016-08-17 DIAGNOSIS — G4733 Obstructive sleep apnea (adult) (pediatric): Secondary | ICD-10-CM | POA: Diagnosis not present

## 2016-08-17 DIAGNOSIS — E1151 Type 2 diabetes mellitus with diabetic peripheral angiopathy without gangrene: Secondary | ICD-10-CM | POA: Diagnosis not present

## 2016-08-20 ENCOUNTER — Encounter: Payer: Self-pay | Admitting: Neurology

## 2016-08-22 ENCOUNTER — Encounter: Payer: Self-pay | Admitting: Neurology

## 2016-08-22 ENCOUNTER — Ambulatory Visit (INDEPENDENT_AMBULATORY_CARE_PROVIDER_SITE_OTHER): Payer: Medicare HMO | Admitting: Neurology

## 2016-08-22 VITALS — BP 126/60 | HR 62 | Resp 16 | Ht 63.0 in | Wt 212.0 lb

## 2016-08-22 DIAGNOSIS — G4733 Obstructive sleep apnea (adult) (pediatric): Secondary | ICD-10-CM | POA: Diagnosis not present

## 2016-08-22 DIAGNOSIS — I480 Paroxysmal atrial fibrillation: Secondary | ICD-10-CM

## 2016-08-22 DIAGNOSIS — Z9989 Dependence on other enabling machines and devices: Secondary | ICD-10-CM | POA: Diagnosis not present

## 2016-08-22 NOTE — Patient Instructions (Signed)

## 2016-08-22 NOTE — Progress Notes (Signed)
Subjective:    Patient ID: Erika Kim is a 70 y.o. female.  HPI     Interim history:   Erika Kim is a very pleasant 70 year old right-handed woman with an underlying medical history of hyperlipidemia, acoustic neuroma, hypertension, DM, recurrent headaches, positional vertigo, left shoulder pain, and sinus congestion, status post sinus surgery, left knee surgery, tubal ligation, right rotator cuff surgery, right total knee replacement surgery, right great toe surgery, who presents for follow-up consultation of her obstructive sleep apnea, on CPAP therapy, and I had also seen her for recurrent vertiginous symptoms and a history of recurrent headaches, concern for vertiginous migraines. I last saw her on 09/02/2015 for vertigo. She had seen Dr. Ernesto Rutherford on 07/20/2015 and he voiced concern that she may have vertiginous migraines. She reported 3 episodes of vertigo in 2016. She had some headaches which are infrequent, some associated with nausea but no significant photophobia and she did report that it would help to lie down and rest and she will take over-the-counter Advil. She felt that her headaches actually improved after she started CPAP therapy and she was fully compliant with CPAP. Her exam is nonfocal at the time and she followed regularly with ENT. I suggested she continue with as needed Aleve for headaches and talked to her ENT about potentially pursuing vestibular rehabilitation for vertigo. She was fully compliant with CPAP therapy. I suggested a one-year checkup.  Today, 08/22/2016: I reviewed her CPAP compliance data from 07/22/2016 through 08/20/2016 which is a total of 30 days, during which time she used her machine every night with percent used days greater than 4 hours at 100%, indicating superb compliance with an average usage of 7 hours and 36 minutes, residual AHI 1.3 per hour, leaked low with the 95th percentile at 6.1 L/m on a pressure of 8 cm with EPR of 2.  Today, 08/22/2016:  She reports doing well, no issues with CPAP, feels like sleep may be overall less and more interrupted, wonders if this is an age related thing. Thankfully, she had no recent episodes of vertigo and no sinister headaches. Some 6 months ago she had a bout of A. fib. She went to the emergency room and was diagnosed with paroxysmal A. fib, started on Xarelto and had a follow-up with cardiologist. She was told that if she should have interim palpitations or feeling of irregular heartbeat she could take an additional Toprol. Otherwise, she has been doing well, thankfully no further episodes of palpitations or tachycardia.  I saw her on 10/08/2014, at which time she was doing well on CPAP therapy with full compliance and great results. She had a recent brain MRI for her acoustic neuroma at the time and her MRI was felt to be stable per her report.   I reviewed her CPAP compliance data from 07/17/2015 through 08/15/2015 which is a total of 30 days during which time she used her machine every day with percent used days greater than 100%, indicating superb compliance with an average usage of 7 hours and 27 minutes, residual AHI low at 1.3 per hour, leak low with the 95th percentile at 4 L/m on a pressure of 8 cm with EPR of 2.   I saw her on 04/08/2014, at which time she reported adjusting well to CPAP and endorse sleeping deeply and without is many interruptions. She felt memory was stable. She had started her diabetes medication.   I reviewed her compliance data from 09/05/2014 to 10/04/2014 which is a total of  30 days during which time she used her machine every night with percent used days greater than 4 hours of 100%, indicating superb compliance. Average usage of 7 hours and 9 minutes, residual AHI low at 1.8 per hour and leak low at 6 L/m for the 95th percentile with the pressure currently at 8 cm with EPR of 2.   I saw her on 10/09/2013, at which time we discussed her recent baseline sleep study which  confirmed mild to moderate sleep apnea. I felt that because of her underlying sleep related complaints and her medical history she would benefit from treatment with CPAP and suggested a repeat sleep study with CPAP titration. She had the study on 01/26/2014 and I went over her test results with her in detail today. Sleep efficiency was markedly reduced at 47.1% with a long latency to sleep of 48.5 minutes and wake after sleep onset of 165 minutes with moderate sleep fragmentation noted. She had an increased percentage of slow-wave sleep and a normal percentage of REM sleep with a prolonged REM latency. She had no significant periodic leg movements of sleep and no significant EKG changes. She had a baseline oxygen saturation of 93% with a nadir of 84%. Snoring was eliminated with CPAP which was started at 5 cm and titrated to 8 cm with elimination of her sleep disordered breathing and her snoring. REM sleep was achieved supine REM sleep was not achieved. Based on her test results I prescribed CPAP for her.   I reviewed the patient's CPAP compliance data from 03/08/2014 to 04/06/2014, which is a total of 30 days, during which time the patient used CPAP every day. The average usage for all days was 7 hours and 21 minutes. The percent used days greater than 4 hours was 100 %, indicating superb compliance. The residual AHI was low at 1.5 per hour, indicating an appropriate treatment pressure of 8 cwp with EPR of 2. Air leak from the mask was low with the 95th percentile at 5.4 L per minute.   I first met her on 08/08/2013 at which time she complained of chronic poor quality sleep, snoring, and recent onset of memory problems, as well as witnessed apneas.    She had a baseline sleep study on 09/04/2013. Her sleep efficiency was reduced at 66.1% with a prolonged latency to sleep of 34.5 minutes. Wake after sleep onset was 91 minutes with moderate sleep fragmentation noted. She had an increased percentage of stage I  and stage II sleep, and near normal percentage of deep sleep and a decreased percentage of REM sleep at 13.3% with a high normal REM latency of 117 minutes. She had no significant periodic leg movements of sleep. She had moderate to at times loud snoring. She had a total AHI of 6.4 per hour with further elevation to 20.9 per hour in the supine position. Her baseline oxygen saturation was 93%, her nadir was 88%. She spent 10 minutes and 14 seconds below the saturation of 90% for the night.   We called her with the test results in the interim and asked her to consider returning for CPAP titration study and she reported that she would like to discuss this with her husband first, which is why I saw her back in follow up.  Her Past Medical History Is Significant For: Past Medical History:  Diagnosis Date  . Benign paroxysmal positional vertigo   . Follow-up examination following completed treatment with high-risk medications, not elsewhere classified   . Obesity  08/08/2013  . Obesity, unspecified   . Other and unspecified hyperlipidemia   . Persistent disorder of initiating or maintaining sleep   . Routine general medical examination at a health care facility 08/06/2013  . Snoring 08/08/2013  . Type II or unspecified type diabetes mellitus without mention of complication, not stated as uncontrolled   . Unspecified essential hypertension     Her Past Surgical History Is Significant For: Past Surgical History:  Procedure Laterality Date  . KNEE SURGERY Left 1993  . NASAL SINUS SURGERY  3/97  . SHOULDER SURGERY Right 7/05  . TOE SURGERY Right 2012   Great toe  . TOTAL KNEE ARTHROPLASTY Right 3/11    Her Family History Is Significant For: Family History  Problem Relation Age of Onset  . Heart failure Father   . Heart failure Mother     Her Social History Is Significant For: Social History   Social History  . Marital status: Married    Spouse name: N/A  . Number of children: N/A  .  Years of education: N/A   Social History Main Topics  . Smoking status: Never Smoker  . Smokeless tobacco: None  . Alcohol use No  . Drug use: No  . Sexual activity: Not Asked   Other Topics Concern  . None   Social History Narrative   Married w/2 children (boy and girl).  Retired.    Her Allergies Are:  Allergies  Allergen Reactions  . Augmentin [Amoxicillin-Pot Clavulanate] Itching  :   Her Current Medications Are:  Outpatient Encounter Prescriptions as of 08/22/2016  Medication Sig  . acetaminophen (TYLENOL) 500 MG tablet Take 1,000 mg by mouth every 6 (six) hours as needed for mild pain, moderate pain, fever or headache.  Marland Kitchen atorvastatin (LIPITOR) 40 MG tablet Take 1 tablet by mouth daily.  . calcium carbonate (OS-CAL) 600 MG TABS tablet Take 600 mg by mouth 2 (two) times daily.   . Cholecalciferol (VITAMIN D3) 2000 units TABS Take 4,000 Units by mouth.  Marland Kitchen ipratropium (ATROVENT) 0.03 % nasal spray Place 2 sprays into the nose 2 (two) times daily as needed for rhinitis.   Marland Kitchen JANUVIA 100 MG tablet Take 1 tablet by mouth daily after supper.  Marland Kitchen lisinopril-hydrochlorothiazide (PRINZIDE,ZESTORETIC) 20-12.5 MG per tablet Take 1 tablet by mouth daily.  . metoprolol succinate (TOPROL-XL) 50 MG 24 hr tablet Take 1 tablet by mouth 2 (two) times daily.  . Multiple Vitamin (MULTIVITAMIN) tablet Take 1 tablet by mouth daily.  . rivaroxaban (XARELTO) 20 MG TABS tablet Take 1 tablet (20 mg total) by mouth daily with supper.  . saline (AYR) GEL Place 1 application into the nose at bedtime.  . [DISCONTINUED] cholecalciferol (VITAMIN D) 1000 units tablet Take 2,000 Units by mouth daily.  . [DISCONTINUED] ketotifen (ZADITOR) 0.025 % ophthalmic solution Place 1 drop into both eyes 2 (two) times daily as needed (allergies).   No facility-administered encounter medications on file as of 08/22/2016.   :  Review of Systems:  Out of a complete 14 point review of systems, all are reviewed and  negative with the exception of these symptoms as listed below: Review of Systems  Neurological:       Patient states that she is doing well with CPAP. No new concerns.     Objective:  Neurologic Exam  Physical Exam Physical Examination:   Vitals:   08/22/16 1138  BP: 126/60  Pulse: 62  Resp: 16   General Examination: The patient is a  very pleasant 70 y.o. female in no acute distress. She appears well-developed and well-nourished and well groomed. She is in good spirits today.  HEENT: Normocephalic, atraumatic, pupils are equal, round and reactive to light and accommodation. Funduscopic exam is normal with sharp disc margins noted. Extraocular tracking is good without limitation to gaze excursion or nystagmus noted. Normal smooth pursuit is noted. Hearing is grossly intact. Face is symmetric with normal facial animation and normal facial sensation. Speech is clear with no dysarthria noted. There is no hypophonia. Her speech is slightly nasal sounding. There is no pharyngeal erythema. There is no lip, neck/head, jaw or voice tremor. Neck is supple with full range of passive and active motion. There are no carotid bruits on auscultation. Oropharynx exam reveals: mild mouth dryness, adequate dental hygiene and mild airway crowding, due to narrow airway entry and floppy soft palate. Mallampati is class II. Tongue protrudes centrally and palate elevates symmetrically. Tonsils are absent.   Chest: Clear to auscultation without wheezing, rhonchi or crackles noted.  Heart: S1+S2+0, regular and normal without murmurs, rubs or gallops noted.   Abdomen: Soft, non-tender and non-distended with normal bowel sounds appreciated on auscultation. Abdomen in obese.  Extremities: There is no pitting edema in the distal lower extremities bilaterally. Pedal pulses are intact.  Skin: Warm and dry without trophic changes noted. There are no varicose veins.  Musculoskeletal: exam reveals no obvious joint  deformities, tenderness or joint swelling or erythema.   Neurologically:  Mental status: The patient is awake, alert and oriented in all 4 spheres. Her memory, attention, language and knowledge are appropriate. There is no aphasia, agnosia, apraxia or anomia. Speech is clear with normal prosody and enunciation. Thought process is linear. Mood is congruent and affect is normal.  Cranial nerves are as described above under HEENT exam. In addition, shoulder shrug is normal with equal shoulder height noted. Motor exam: Normal bulk, strength and tone is noted. There is no drift, tremor or rebound. Romberg is negative. Reflexes are 1+ in UEs, and trace in the LEs. Fine motor skills are intact with normal finger taps, normal hand movements, normal rapid alternating patting, normal foot taps and normal foot agility.  Cerebellar testing shows no dysmetria or intention tremor on finger to nose testing. There is no truncal or gait ataxia.  Sensory exam is intact to light touch in the upper and lower extremities.  Gait, station and balance: She stands without problems, denies any lightheadedness or vertiginous symptoms. Posture is age-appropriate and gait is unremarkable, tandem walk is difficult for her.   Assessment and Plan:   In summary, Erika Kim is a very pleasant 70 year old female with an underlying medical history of hyperlipidemia, acoustic neuroma (followed by Dr. Owens Shark at [facility]), hypertension, DM, recurrent headaches, positional vertigo, left shoulder pain, and sinus congestion, status multiple surgeries including sinus surgery, left knee surgery, tubal ligation, right rotator cuff surgery, right total knee replacement surgery, right great toe surgery,  and obesity, who presents for follow-up consultation of her obstructive sleep apnea, on CPAP therapy with full compliance and ongoing good results. She has been tolerating the nasal pillows, and the current settings are adequate. She is  encouraged to continue with treatment, thankfully has not had any recurrence of vertigo or sinister headaches. She has in the interim been diagnosed with paroxysmal A. fib and has been on Xarelto. She continues to be on Toprol twice daily. Physical exam and neurological exam are stable and nonfocal. I suggested follow  up in one year. I answered all her questions today and the patient was in agreement with the above. I encouraged her to call with any interim questions, concerns, problems or updates. I spent 20 minutes in total face-to-face time with the patient, more than 50% of which was spent in counseling and coordination of care, reviewing test results, reviewing medication and discussing or reviewing the diagnosis of vertigo, migraines, OSA, the prognosis and treatment options.

## 2016-08-23 ENCOUNTER — Ambulatory Visit: Payer: Medicare HMO | Admitting: Neurology

## 2016-08-24 DIAGNOSIS — Z1212 Encounter for screening for malignant neoplasm of rectum: Secondary | ICD-10-CM | POA: Diagnosis not present

## 2016-08-25 DIAGNOSIS — H52213 Irregular astigmatism, bilateral: Secondary | ICD-10-CM | POA: Diagnosis not present

## 2016-08-25 DIAGNOSIS — H5703 Miosis: Secondary | ICD-10-CM | POA: Diagnosis not present

## 2016-08-25 DIAGNOSIS — R69 Illness, unspecified: Secondary | ICD-10-CM | POA: Diagnosis not present

## 2016-08-25 DIAGNOSIS — I119 Hypertensive heart disease without heart failure: Secondary | ICD-10-CM | POA: Diagnosis not present

## 2016-08-25 DIAGNOSIS — R0602 Shortness of breath: Secondary | ICD-10-CM | POA: Diagnosis not present

## 2016-08-25 DIAGNOSIS — Z0100 Encounter for examination of eyes and vision without abnormal findings: Secondary | ICD-10-CM | POA: Diagnosis not present

## 2016-08-31 ENCOUNTER — Ambulatory Visit: Payer: Medicare HMO | Admitting: Neurology

## 2016-09-11 DIAGNOSIS — G4733 Obstructive sleep apnea (adult) (pediatric): Secondary | ICD-10-CM | POA: Diagnosis not present

## 2016-09-13 ENCOUNTER — Encounter: Payer: Self-pay | Admitting: *Deleted

## 2016-09-18 DIAGNOSIS — J322 Chronic ethmoidal sinusitis: Secondary | ICD-10-CM | POA: Diagnosis not present

## 2016-09-18 DIAGNOSIS — J320 Chronic maxillary sinusitis: Secondary | ICD-10-CM | POA: Diagnosis not present

## 2016-09-18 DIAGNOSIS — J040 Acute laryngitis: Secondary | ICD-10-CM | POA: Diagnosis not present

## 2016-09-21 ENCOUNTER — Encounter: Payer: Self-pay | Admitting: Cardiology

## 2016-09-21 ENCOUNTER — Ambulatory Visit (INDEPENDENT_AMBULATORY_CARE_PROVIDER_SITE_OTHER): Payer: Medicare HMO | Admitting: Cardiology

## 2016-09-21 ENCOUNTER — Encounter (INDEPENDENT_AMBULATORY_CARE_PROVIDER_SITE_OTHER): Payer: Self-pay

## 2016-09-21 VITALS — BP 130/70 | HR 58 | Ht 63.0 in | Wt 208.1 lb

## 2016-09-21 DIAGNOSIS — E7800 Pure hypercholesterolemia, unspecified: Secondary | ICD-10-CM

## 2016-09-21 DIAGNOSIS — I480 Paroxysmal atrial fibrillation: Secondary | ICD-10-CM

## 2016-09-21 DIAGNOSIS — I10 Essential (primary) hypertension: Secondary | ICD-10-CM

## 2016-09-21 DIAGNOSIS — G4733 Obstructive sleep apnea (adult) (pediatric): Secondary | ICD-10-CM

## 2016-09-21 MED ORDER — RIVAROXABAN 20 MG PO TABS
20.0000 mg | ORAL_TABLET | Freq: Every day | ORAL | 3 refills | Status: DC
Start: 2016-09-21 — End: 2017-05-31

## 2016-09-21 NOTE — Progress Notes (Signed)
Cardiology Office Note    Date:  09/21/2016   ID:  Erika Kim, DOB September 24, 1946, MRN [id number]  PCP:  Erika Lopes, MD  Cardiologist:   Erika Furbish, MD     History of Present Illness:  Erika Kim is a 70 y.o. female here for follow up of atrial fibrillation having previously been seen in the emergency department on 02/18/16. She returned from dinner, went upstairs and her heart began to skip. She felt like her heart was going to jump out of her chest. Lasted one-two hours. Xarelto was started for atrial fibrillation, risk score of 4. Atrial fibrillation was noted on 02/17/16, normal rate. The next day 02/18/16 at 2 AM, she was in sinus rhythm.  Since that emergency room visit, she may have had a few more episodes of atrial fibrillation, usually they last approximately one hour. No prior strokes.  Positive for diabetes, hypertension, female, age greater than 75.  No bleeding issues. Tolerating Xarelto well. Has been on Toprol.  Both grandparents had CVA's. Father with electrical issues with heart. Mother had CP. CHF.   05/22/16-Felt a skip last week. Rare. Tolerating Xarelto well, no bleeding. Worried about do-nut hole. No chest pain, no shortness of breath. She is excited, just bought a new camper. They're going down to [place] for the winter.  09/21/16 - blood on tongue one night, Xarelto. This subsided. She is having no significant palpitations. When she exercises sometimes her heart rate goes up to 140, she slows down and her heart rate slows down as well. They're going to [place] in their camper. No chest pain. No syncopal.  Past Medical History:  Diagnosis Date  . Benign paroxysmal positional vertigo   . Follow-up examination following completed treatment with high-risk medications, not elsewhere classified   . Obesity 08/08/2013  . Obesity, unspecified   . Other and unspecified hyperlipidemia   . Persistent disorder of initiating or maintaining sleep   . Routine  general medical examination at a health care facility 08/06/2013  . Snoring 08/08/2013  . Type II or unspecified type diabetes mellitus without mention of complication, not stated as uncontrolled   . Unspecified essential hypertension     Past Surgical History:  Procedure Laterality Date  . KNEE SURGERY Left 1993  . NASAL SINUS SURGERY  3/97  . SHOULDER SURGERY Right 7/05  . TOE SURGERY Right 2012   Great toe  . TOTAL KNEE ARTHROPLASTY Right 3/11    Current Medications: Outpatient Medications Prior to Visit  Medication Sig Dispense Refill  . acetaminophen (TYLENOL) 500 MG tablet Take 1,000 mg by mouth every 6 (six) hours as needed for mild pain, moderate pain, fever or headache.    Marland Kitchen atorvastatin (LIPITOR) 40 MG tablet Take 1 tablet by mouth daily.    . calcium carbonate (OS-CAL) 600 MG TABS tablet Take 600 mg by mouth 2 (two) times daily.     . Cholecalciferol (VITAMIN D3) 2000 units TABS Take 4,000 Units by mouth.    Marland Kitchen ipratropium (ATROVENT) 0.03 % nasal spray Place 2 sprays into the nose 2 (two) times daily as needed for rhinitis.     Marland Kitchen JANUVIA 100 MG tablet Take 1 tablet by mouth daily after supper.    Marland Kitchen lisinopril-hydrochlorothiazide (PRINZIDE,ZESTORETIC) 20-12.5 MG per tablet Take 1 tablet by mouth daily.    . metoprolol succinate (TOPROL-XL) 50 MG 24 hr tablet Take 1 tablet by mouth 2 (two) times daily.    . Multiple Vitamin (MULTIVITAMIN) tablet Take 1  tablet by mouth daily.    . saline (AYR) GEL Place 1 application into the nose at bedtime.    . rivaroxaban (XARELTO) 20 MG TABS tablet Take 1 tablet (20 mg total) by mouth daily with supper. 90 tablet 3   No facility-administered medications prior to visit.      Allergies:   Augmentin [amoxicillin-pot clavulanate]   Social History   Social History  . Marital status: Married    Spouse name: N/A  . Number of children: N/A  . Years of education: N/A   Social History Main Topics  . Smoking status: Never Smoker  .  Smokeless tobacco: Never Used  . Alcohol use No  . Drug use: No  . Sexual activity: Not Asked   Other Topics Concern  . None   Social History Narrative   Married w/2 children (boy and girl).  Retired.     Family History:  The patient's family history includes Heart failure in her father and mother.   ROS:   Please see the history of present illness.   No recent fevers, chills. No surgeries. ROS All other systems reviewed and are negative.   PHYSICAL EXAM:   VS:  BP 130/70   Pulse (!) 58   Ht 5\' 3"  (1.6 m)   Wt 208 lb 1.9 oz (94.4 kg)   SpO2 98%   BMI 36.87 kg/m    GEN: Well nourished, well developed, in no acute distress  HEENT: normal  Neck: no JVD, carotid bruits, or masses Cardiac: RRR; 2/6 systolic murmur RUSB, no rubs, or gallops,no edema  Respiratory:  clear to auscultation bilaterally, normal work of breathing GI: soft, nontender, nondistended, + BS, obese MS: no deformity or atrophy  Skin: warm and dry, no rash Neuro:  Alert and Oriented x 3, Strength and sensation are intact Psych: euthymic mood, full affect  Wt Readings from Last 3 Encounters:  09/21/16 208 lb 1.9 oz (94.4 kg)  08/22/16 212 lb (96.2 kg)  05/22/16 213 lb (96.6 kg)      Studies/Labs Reviewed:   EKG:  None today  Recent Labs: 02/17/2016: BUN 12; Creatinine, Ser 0.68; Hemoglobin 15.0; Platelets 153; Potassium 4.8; Sodium 141   Lipid Panel No results found for: CHOL, TRIG, HDL, CHOLHDL, VLDL, LDLCALC, LDLDIRECT  Additional studies/ records that were reviewed today include:  Prior office note from Erika Kim, lab work reviewed.  ECHO 03/28/16: - Left ventricle: The cavity size was normal. Systolic function was   normal. The estimated ejection fraction was in the range of 60%   to 65%. Wall motion was normal; there were no regional wall   motion abnormalities. The transmitral flow pattern was normal.   Left ventricular diastolic function parameters were normal. - Aortic valve:  Moderate focal calcification, consistent with   sclerosis. There was mild regurgitation. - Mitral valve: There was mild regurgitation.    ASSESSMENT:    1. Paroxysmal atrial fibrillation (HCC)   2. Obstructive sleep apnea   3. Essential hypertension   4. Pure hypercholesterolemia      PLAN:  In order of problems listed above:  Paroxysmal atrial fibrillation  - Xarelto - CHADS-VASc -4   - Brief episodes, 1-2 hours.She felt a rare episode last Friday, brief palpitation.  - Take extra Toprol 50 mg if she is having rapid heartbeat. She wears a fitbit and sometimes this helps her. Doing well.   - Next step would be antiarrhythmic therapy. She is no history of coronary artery disease. Perhaps  a class IA agent would be helpful. Overall she is doing well currently.  - Discussed weight loss. She wears CPAP for obstructive sleep apnea. Exercise. Decaffeinated coffee. No decongestants.  - Check echocardiogram. Heart murmur heard.  OSA  - CPAP treatment  Obesity  - Weight loss low carbohydrates, discussed once again  Essential hypertension  - Multidrug regimen  - She has actually been on Toprol for some time.  Hyperlipidemia  - Continue atorvastatin, no side effects.  Diabetes  - Januvia, Erika Kim monitoring closely.   Going to [place].     Medication Adjustments/Labs and Tests Ordered: Current medicines are reviewed at length with the patient today.  Concerns regarding medicines are outlined above.  Medication changes, Labs and Tests ordered today are listed in the Patient Instructions below. Patient Instructions  Medication Instructions:  The current medical regimen is effective;  continue present plan and medications.  Follow-Up: Follow up in 6 months with Bonney Leitz, PA.  You will receive a letter in the mail 2 months before you are due.  Please call us when you receive this letter to schedule your follow up appointment.  If you need a refill on your cardiac  medications before your next appointment, please call your pharmacy.  Thank you for choosing [facility]!!        Signed, Erika Furbish, MD  09/21/2016 8:20 AM    [facility] [address], [place], [place]  [address] Phone: [phone]; Fax: [phone]

## 2016-09-21 NOTE — Patient Instructions (Signed)
Medication Instructions:  The current medical regimen is effective;  continue present plan and medications.  Follow-Up: Follow up in 6 months with Katy Thompson, PA.  You will receive a letter in the mail 2 months before you are due.  Please call us when you receive this letter to schedule your follow up appointment.  If you need a refill on your cardiac medications before your next appointment, please call your pharmacy.  Thank you for choosing [facility]!!     

## 2016-12-13 DIAGNOSIS — G4733 Obstructive sleep apnea (adult) (pediatric): Secondary | ICD-10-CM | POA: Diagnosis not present

## 2016-12-18 DIAGNOSIS — N950 Postmenopausal bleeding: Secondary | ICD-10-CM | POA: Diagnosis not present

## 2016-12-20 DIAGNOSIS — N950 Postmenopausal bleeding: Secondary | ICD-10-CM | POA: Diagnosis not present

## 2016-12-23 DIAGNOSIS — Z7901 Long term (current) use of anticoagulants: Secondary | ICD-10-CM | POA: Diagnosis not present

## 2016-12-23 DIAGNOSIS — N950 Postmenopausal bleeding: Secondary | ICD-10-CM | POA: Diagnosis not present

## 2016-12-23 DIAGNOSIS — N939 Abnormal uterine and vaginal bleeding, unspecified: Secondary | ICD-10-CM | POA: Diagnosis not present

## 2016-12-23 DIAGNOSIS — D259 Leiomyoma of uterus, unspecified: Secondary | ICD-10-CM | POA: Diagnosis not present

## 2016-12-23 DIAGNOSIS — I4891 Unspecified atrial fibrillation: Secondary | ICD-10-CM | POA: Diagnosis not present

## 2016-12-26 DIAGNOSIS — N840 Polyp of corpus uteri: Secondary | ICD-10-CM | POA: Diagnosis not present

## 2016-12-26 DIAGNOSIS — N950 Postmenopausal bleeding: Secondary | ICD-10-CM | POA: Diagnosis not present

## 2016-12-26 DIAGNOSIS — D259 Leiomyoma of uterus, unspecified: Secondary | ICD-10-CM | POA: Diagnosis not present

## 2016-12-31 DIAGNOSIS — R69 Illness, unspecified: Secondary | ICD-10-CM | POA: Diagnosis not present

## 2017-01-05 ENCOUNTER — Encounter (HOSPITAL_BASED_OUTPATIENT_CLINIC_OR_DEPARTMENT_OTHER): Payer: Self-pay | Admitting: *Deleted

## 2017-01-08 NOTE — H&P (Signed)
NAMELAZARIA, SCHABEN NO.:  [id number]  MEDICAL RECORD NO.:  [id number]  LOCATION:                                 FACILITY:  PHYSICIAN:  Darlyn Chamber, M.D.        DATE OF BIRTH:  DATE OF ADMISSION: DATE OF DISCHARGE:                             HISTORY & PHYSICAL   DATE OF SURGERY:  February 12, 2017, at [facility] on [place].  [place]:  The patient is a 71 year old postmenopausal patient who had episodes of vaginal bleeding.  She underwent an evaluation here in the office.  She had something protruding from the cervix that is either a polyp or fibroid.  A saline infusion ultrasound also revealed a fundal lesion.  We removed with a sponge stick the polyp- like outgrowth.  It was a polyp with some complex hyperplasia, but no atypia.  She now proceeds for hysteroscopic evaluation.  ALLERGIES:  In terms of allergies, she is allergic to: 1. AUGMENTIN. 2. __________.  MEDICATIONS: 1. Xarelto 20 mg a day. 2. Januvia 50 mg a day. 3. Toprol. 4. Lisinopril. 5. Lipitor.  PAST MEDICAL HISTORY:  She has a history of hypertension, diabetes, and hyperlipidemia.  Also history of atrial fib, for which she is on Xarelto.  She has had 2 vaginal deliveries.  She has had sinus surgery, elbow replacement, and knee surgery.  She has had her tubes tied.  She had toe surgery.  SOCIAL HISTORY:  Reveals no tobacco or alcohol use.  FAMILY HISTORY:  Noncontributory.  PHYSICAL EXAMINATION:  VITAL SIGNS:  The patient is afebrile.  Stable vital signs. HEENT:  The patient is normocephalic.  Pupils equal, round, and reactive to light and accommodation.  Extraocular movements are intact.  Sclerae and conjunctivae are clear.  Oropharynx is clear. LUNGS:  Clear. CARDIOVASCULAR SYSTEM:  Regular rate.  No murmurs or gallops.  No carotid or abdominal bruits. ABDOMEN:  Benign.  No mass, organomegaly, or tenderness. PELVIC:  Normal external  genitalia.  Vaginal mucosa is clear.  Cervix unremarkable.  Uterus normal size, shape, and contour.  Adnexa free of mass or tenderness. EXTREMITIES:  Trace edema. NEUROLOGIC:  Grossly within normal limits.  IMPRESSION: 1. Postmenopausal bleeding with evidence of a prolapsed polyp with     some complex hyperplasia and no atypia. 2. Atrial fibrillation. 3. Hypertension. 4. Hyperlipidemia. 5. Diabetes. 6. Sleep apnea.  PLAN:  The patient with undergo an outpatient surgery.  We are going to do hysteroscopy with D and C.  The risks have been discussed including the risk of infection.  Risk of hemorrhage that could require transfusion with the risk of AIDS or hepatitis.  Excessive bleeding could require hysterectomy.  Risk of perforation with injury to adjacent organs such as bowel that could require laparoscopy with possible exploratory surgery for management.  The risk of deep venous thrombosis and pulmonary embolus.  The patient expressed understanding of indications and risks.     Darlyn Chamber, M.D.     JSM/MEDQ  D:  01/08/2017  T:  01/08/2017  Job:  [id number]

## 2017-01-08 NOTE — H&P (Signed)
Patient name Erika Kim, Erika Kim DICTATION#[id number] CSN# [id number]  Darlyn Chamber, MD 01/08/2017 1:56 PM

## 2017-01-09 ENCOUNTER — Encounter (HOSPITAL_BASED_OUTPATIENT_CLINIC_OR_DEPARTMENT_OTHER): Payer: Self-pay | Admitting: *Deleted

## 2017-01-09 DIAGNOSIS — M170 Bilateral primary osteoarthritis of knee: Secondary | ICD-10-CM | POA: Diagnosis not present

## 2017-01-09 NOTE — Progress Notes (Addendum)
NPO AFTER MN.  ARRIVE AT 0600.  GETTING LAB WORK DONE Wednesday 01-10-2017 (CBC, BMET, PT/INR).  CURRENT EKG IN  CHART AND EPIC.  CALLED AND LM FOR DR MCCOMB, VIA HEATHER,  PT UNSURE IF WAS TOLD TO STOP XARELTO OR NOT.  WAITING RETURN CALL W/ RECOMMENDATION AND WILL PT BACK.  ADDENDUM:  RECEIVED CALL BACK VIA PHONE W/ HEATHER.  PER DR MCCOMB PT NOT TO STOP XARELTO. CALLED AND SPOKE W/ PT VIA PHONE , VERBALIZED UNDERSTANDING.

## 2017-01-10 DIAGNOSIS — G4733 Obstructive sleep apnea (adult) (pediatric): Secondary | ICD-10-CM | POA: Diagnosis not present

## 2017-01-10 DIAGNOSIS — I480 Paroxysmal atrial fibrillation: Secondary | ICD-10-CM | POA: Diagnosis not present

## 2017-01-10 DIAGNOSIS — Z79899 Other long term (current) drug therapy: Secondary | ICD-10-CM | POA: Diagnosis not present

## 2017-01-10 DIAGNOSIS — Z880 Allergy status to penicillin: Secondary | ICD-10-CM | POA: Diagnosis not present

## 2017-01-10 DIAGNOSIS — E119 Type 2 diabetes mellitus without complications: Secondary | ICD-10-CM | POA: Diagnosis not present

## 2017-01-10 DIAGNOSIS — N950 Postmenopausal bleeding: Secondary | ICD-10-CM | POA: Diagnosis not present

## 2017-01-10 DIAGNOSIS — Z7901 Long term (current) use of anticoagulants: Secondary | ICD-10-CM | POA: Diagnosis not present

## 2017-01-10 DIAGNOSIS — N840 Polyp of corpus uteri: Secondary | ICD-10-CM | POA: Diagnosis present

## 2017-01-10 DIAGNOSIS — I10 Essential (primary) hypertension: Secondary | ICD-10-CM | POA: Diagnosis not present

## 2017-01-10 DIAGNOSIS — E785 Hyperlipidemia, unspecified: Secondary | ICD-10-CM | POA: Diagnosis not present

## 2017-01-10 LAB — BASIC METABOLIC PANEL
ANION GAP: 9 (ref 5–15)
BUN: 16 mg/dL (ref 6–20)
CALCIUM: 10.5 mg/dL — AB (ref 8.9–10.3)
CO2: 27 mmol/L (ref 22–32)
Chloride: 102 mmol/L (ref 101–111)
Creatinine, Ser: 0.62 mg/dL (ref 0.44–1.00)
Glucose, Bld: 114 mg/dL — ABNORMAL HIGH (ref 65–99)
POTASSIUM: 4.3 mmol/L (ref 3.5–5.1)
SODIUM: 138 mmol/L (ref 135–145)

## 2017-01-10 LAB — CBC
HCT: 39.1 % (ref 36.0–46.0)
Hemoglobin: 13.5 g/dL (ref 12.0–15.0)
MCH: 28.4 pg (ref 26.0–34.0)
MCHC: 34.5 g/dL (ref 30.0–36.0)
MCV: 82.3 fL (ref 78.0–100.0)
PLATELETS: 204 10*3/uL (ref 150–400)
RBC: 4.75 MIL/uL (ref 3.87–5.11)
RDW: 12.8 % (ref 11.5–15.5)
WBC: 21.5 10*3/uL — AB (ref 4.0–10.5)

## 2017-01-10 LAB — PROTIME-INR
INR: 1.53
Prothrombin Time: 18.6 seconds — ABNORMAL HIGH (ref 11.4–15.2)

## 2017-01-10 LAB — APTT: APTT: 29 s (ref 24–36)

## 2017-01-11 ENCOUNTER — Telehealth: Payer: Self-pay | Admitting: Cardiology

## 2017-01-11 NOTE — Telephone Encounter (Signed)
I spoke to patient.  She states she is having a procedure done tomorrow at [facility] and would like to know if it is safe for her to take the pain medication they give her. I advised her they do have her medical record and should review it with her upon arrival tomorrow.  I advised her to be sure to tell them she take Xarelto just as a precaution.  She voiced understanding and agreed with plan.  I noticed patient is having D&C/Hystroscopy w/ Myosure tomorrow.  We have not received a clearance form for her or request to hold Xarelto.  I did call surgeon's office, [facility], Dr. Arvella Nigh, to clarify she does not need to hold Xarelto prior to this procedure.  I did speak to Dr. Radene Knee and made him aware the patient is on Xarelto. He states patient does not need to hold Xarelto for this procedure.

## 2017-01-11 NOTE — Anesthesia Preprocedure Evaluation (Signed)
Anesthesia Evaluation  Patient identified by MRN, date of birth, ID band Patient awake    Reviewed: Allergy & Precautions, H&P , Patient's Chart, lab work & pertinent test results, reviewed documented beta blocker date and time   Airway Mallampati: II  TM Distance: >3 FB Neck ROM: full    Dental no notable dental hx.    Pulmonary    Pulmonary exam normal breath sounds clear to auscultation       Cardiovascular hypertension,  Rhythm:regular Rate:Normal     Neuro/Psych    GI/Hepatic   Endo/Other  diabetes  Renal/GU      Musculoskeletal   Abdominal   Peds  Hematology   Anesthesia Other Findings Hypertension       Type 2 diabetes mellitus      PAF (paroxysmal atrial fibrillation)  cardiologist- dr Marlou Porch ; uses xarelto and metoprolol   OSA on CPAP         Reproductive/Obstetrics                             Anesthesia Physical Anesthesia Plan  ASA: II  Anesthesia Plan: General   Post-op Pain Management:    Induction: Intravenous  Airway Management Planned: LMA  Additional Equipment:   Intra-op Plan:   Post-operative Plan:   Informed Consent: I have reviewed the patients History and Physical, chart, labs and discussed the procedure including the risks, benefits and alternatives for the proposed anesthesia with the patient or authorized representative who has indicated his/her understanding and acceptance.   Dental Advisory Given  Plan Discussed with: CRNA and Surgeon  Anesthesia Plan Comments: ( )        Anesthesia Quick Evaluation

## 2017-01-11 NOTE — Telephone Encounter (Signed)
New Message:   Pt is having a procedure at [facility].She wants to know if they prescribe pain medicine,should she take it ,since she is on  Xarelto?Please call today if possible and let her know something.

## 2017-01-12 ENCOUNTER — Ambulatory Visit (HOSPITAL_BASED_OUTPATIENT_CLINIC_OR_DEPARTMENT_OTHER): Payer: Medicare HMO | Admitting: Anesthesiology

## 2017-01-12 ENCOUNTER — Encounter (HOSPITAL_BASED_OUTPATIENT_CLINIC_OR_DEPARTMENT_OTHER): Payer: Self-pay

## 2017-01-12 ENCOUNTER — Encounter (HOSPITAL_BASED_OUTPATIENT_CLINIC_OR_DEPARTMENT_OTHER)
Admission: RE | Disposition: A | Discharge status: Home / Self Care | Payer: Self-pay | Source: Ambulatory Visit | Attending: Obstetrics and Gynecology

## 2017-01-12 ENCOUNTER — Ambulatory Visit (HOSPITAL_BASED_OUTPATIENT_CLINIC_OR_DEPARTMENT_OTHER)
Admission: RE | Admit: 2017-01-12 | Discharge: 2017-01-12 | Disposition: A | Discharge status: Home / Self Care | Payer: Medicare HMO | Source: Ambulatory Visit | Attending: Obstetrics and Gynecology | Admitting: Obstetrics and Gynecology

## 2017-01-12 DIAGNOSIS — N8502 Endometrial intraepithelial neoplasia [EIN]: Secondary | ICD-10-CM | POA: Diagnosis not present

## 2017-01-12 DIAGNOSIS — N840 Polyp of corpus uteri: Secondary | ICD-10-CM

## 2017-01-12 DIAGNOSIS — G4733 Obstructive sleep apnea (adult) (pediatric): Secondary | ICD-10-CM | POA: Diagnosis not present

## 2017-01-12 DIAGNOSIS — E785 Hyperlipidemia, unspecified: Secondary | ICD-10-CM | POA: Insufficient documentation

## 2017-01-12 DIAGNOSIS — Z880 Allergy status to penicillin: Secondary | ICD-10-CM | POA: Diagnosis not present

## 2017-01-12 DIAGNOSIS — I10 Essential (primary) hypertension: Secondary | ICD-10-CM | POA: Insufficient documentation

## 2017-01-12 DIAGNOSIS — R0683 Snoring: Secondary | ICD-10-CM | POA: Diagnosis not present

## 2017-01-12 DIAGNOSIS — Z79899 Other long term (current) drug therapy: Secondary | ICD-10-CM | POA: Insufficient documentation

## 2017-01-12 DIAGNOSIS — I480 Paroxysmal atrial fibrillation: Secondary | ICD-10-CM | POA: Insufficient documentation

## 2017-01-12 DIAGNOSIS — N950 Postmenopausal bleeding: Secondary | ICD-10-CM | POA: Diagnosis not present

## 2017-01-12 DIAGNOSIS — Z7901 Long term (current) use of anticoagulants: Secondary | ICD-10-CM | POA: Insufficient documentation

## 2017-01-12 DIAGNOSIS — E119 Type 2 diabetes mellitus without complications: Secondary | ICD-10-CM | POA: Insufficient documentation

## 2017-01-12 HISTORY — DX: Polyp of corpus uteri: N84.0

## 2017-01-12 HISTORY — DX: Obstructive sleep apnea (adult) (pediatric): Z99.89

## 2017-01-12 HISTORY — DX: Benign neoplasm of cranial nerves: D33.3

## 2017-01-12 HISTORY — DX: Long term (current) use of anticoagulants: Z79.01

## 2017-01-12 HISTORY — DX: Obstructive sleep apnea (adult) (pediatric): G47.33

## 2017-01-12 HISTORY — DX: Hyperlipidemia, unspecified: E78.5

## 2017-01-12 HISTORY — DX: Unspecified temporomandibular joint disorder, unspecified side: M26.609

## 2017-01-12 HISTORY — DX: Frequency of micturition: R35.0

## 2017-01-12 HISTORY — DX: Unspecified osteoarthritis, unspecified site: M19.90

## 2017-01-12 HISTORY — DX: Presence of spectacles and contact lenses: Z97.3

## 2017-01-12 HISTORY — DX: Type 2 diabetes mellitus without complications: E11.9

## 2017-01-12 HISTORY — DX: Paroxysmal atrial fibrillation: I48.0

## 2017-01-12 HISTORY — PX: DILATATION & CURETTAGE/HYSTEROSCOPY WITH MYOSURE: SHX6511

## 2017-01-12 HISTORY — DX: Essential (primary) hypertension: I10

## 2017-01-12 HISTORY — DX: Postmenopausal bleeding: N95.0

## 2017-01-12 LAB — GLUCOSE, CAPILLARY
GLUCOSE-CAPILLARY: 89 mg/dL (ref 65–99)
Glucose-Capillary: 92 mg/dL (ref 65–99)

## 2017-01-12 SURGERY — DILATATION & CURETTAGE/HYSTEROSCOPY WITH MYOSURE
Anesthesia: General | Site: Uterus

## 2017-01-12 MED ORDER — FENTANYL CITRATE (PF) 100 MCG/2ML IJ SOLN
25.0000 ug | INTRAMUSCULAR | Status: DC | PRN
  Filled 2017-01-12: qty 1

## 2017-01-12 MED ORDER — LIDOCAINE 2% (20 MG/ML) 5 ML SYRINGE
INTRAMUSCULAR | Status: DC | PRN
  Administered 2017-01-12: 80 mg via INTRAVENOUS

## 2017-01-12 MED ORDER — ONDANSETRON HCL 4 MG/2ML IJ SOLN
INTRAMUSCULAR | Status: AC
  Filled 2017-01-12: qty 2

## 2017-01-12 MED ORDER — LACTATED RINGERS IV SOLN
INTRAVENOUS | Status: DC
  Administered 2017-01-12: 07:00:00 via INTRAVENOUS
  Filled 2017-01-12: qty 1000

## 2017-01-12 MED ORDER — METRONIDAZOLE IN NACL 5-0.79 MG/ML-% IV SOLN
500.0000 mg | INTRAVENOUS | Status: AC
  Administered 2017-01-12: 500 mg via INTRAVENOUS
  Filled 2017-01-12: qty 100

## 2017-01-12 MED ORDER — EPHEDRINE 5 MG/ML INJ
INTRAVENOUS | Status: AC
  Filled 2017-01-12: qty 10

## 2017-01-12 MED ORDER — MIDAZOLAM HCL 2 MG/2ML IJ SOLN
INTRAMUSCULAR | Status: AC
  Filled 2017-01-12: qty 2

## 2017-01-12 MED ORDER — METRONIDAZOLE IN NACL 5-0.79 MG/ML-% IV SOLN
INTRAVENOUS | Status: AC
  Filled 2017-01-12: qty 100

## 2017-01-12 MED ORDER — SODIUM CHLORIDE 0.9 % IR SOLN
Status: DC | PRN
  Administered 2017-01-12: 3000 mL

## 2017-01-12 MED ORDER — OXYCODONE-ACETAMINOPHEN 7.5-325 MG PO TABS: 1.0000 | ORAL_TABLET | Freq: Four times a day (QID) | ORAL | 0 refills | Status: DC | PRN

## 2017-01-12 MED ORDER — DEXAMETHASONE SODIUM PHOSPHATE 4 MG/ML IJ SOLN
INTRAMUSCULAR | Status: DC | PRN
  Administered 2017-01-12: 10 mg via INTRAVENOUS

## 2017-01-12 MED ORDER — LIDOCAINE 2% (20 MG/ML) 5 ML SYRINGE
INTRAMUSCULAR | Status: AC
  Filled 2017-01-12: qty 5

## 2017-01-12 MED ORDER — EPHEDRINE SULFATE-NACL 50-0.9 MG/10ML-% IV SOSY
PREFILLED_SYRINGE | INTRAVENOUS | Status: DC | PRN
  Administered 2017-01-12: 10 mg via INTRAVENOUS

## 2017-01-12 MED ORDER — PROPOFOL 10 MG/ML IV BOLUS
INTRAVENOUS | Status: DC | PRN
  Administered 2017-01-12: 150 mg via INTRAVENOUS

## 2017-01-12 MED ORDER — FENTANYL CITRATE (PF) 100 MCG/2ML IJ SOLN
INTRAMUSCULAR | Status: DC | PRN
  Administered 2017-01-12: 50 ug via INTRAVENOUS

## 2017-01-12 MED ORDER — LIDOCAINE HCL 1 % IJ SOLN
INTRAMUSCULAR | Status: DC | PRN
  Administered 2017-01-12: 20 mL

## 2017-01-12 MED ORDER — CIPROFLOXACIN IN D5W 400 MG/200ML IV SOLN
400.0000 mg | INTRAVENOUS | Status: AC
  Administered 2017-01-12: 400 mg via INTRAVENOUS
  Filled 2017-01-12: qty 200

## 2017-01-12 MED ORDER — DEXAMETHASONE SODIUM PHOSPHATE 10 MG/ML IJ SOLN
INTRAMUSCULAR | Status: AC
Start: 2017-01-12 — End: 2017-01-12
  Filled 2017-01-12: qty 1

## 2017-01-12 MED ORDER — PROPOFOL 10 MG/ML IV BOLUS
INTRAVENOUS | Status: AC
  Filled 2017-01-12: qty 40

## 2017-01-12 MED ORDER — FENTANYL CITRATE (PF) 100 MCG/2ML IJ SOLN
INTRAMUSCULAR | Status: AC
  Filled 2017-01-12: qty 2

## 2017-01-12 MED ORDER — MIDAZOLAM HCL 5 MG/5ML IJ SOLN
INTRAMUSCULAR | Status: DC | PRN
  Administered 2017-01-12: 1 mg via INTRAVENOUS

## 2017-01-12 MED ORDER — CIPROFLOXACIN IN D5W 400 MG/200ML IV SOLN
INTRAVENOUS | Status: AC
  Filled 2017-01-12: qty 200

## 2017-01-12 MED ORDER — ARTIFICIAL TEARS OP OINT
TOPICAL_OINTMENT | OPHTHALMIC | Status: AC
  Filled 2017-01-12: qty 3.5

## 2017-01-12 MED ORDER — ONDANSETRON HCL 4 MG/2ML IJ SOLN
INTRAMUSCULAR | Status: DC | PRN
  Administered 2017-01-12: 4 mg via INTRAVENOUS

## 2017-01-12 MED ORDER — KETOROLAC TROMETHAMINE 30 MG/ML IJ SOLN
INTRAMUSCULAR | Status: AC
  Filled 2017-01-12: qty 1

## 2017-01-12 SURGICAL SUPPLY — 32 items
CANISTER SUCT 3000ML PPV (MISCELLANEOUS) ×2 IMPLANT
CATH ROBINSON RED A/P 16FR (CATHETERS) IMPLANT
COVER BACK TABLE 60X90IN (DRAPES) ×2 IMPLANT
DEVICE MYOSURE LITE (MISCELLANEOUS) IMPLANT
DEVICE MYOSURE REACH (MISCELLANEOUS) ×2 IMPLANT
DILATOR CANAL MILEX (MISCELLANEOUS) IMPLANT
DRAPE LG THREE QUARTER DISP (DRAPES) ×2 IMPLANT
DRSG TELFA 3X8 NADH (GAUZE/BANDAGES/DRESSINGS) ×2 IMPLANT
ELECT REM PT RETURN 9FT ADLT (ELECTROSURGICAL) ×2
ELECTRODE REM PT RTRN 9FT ADLT (ELECTROSURGICAL) ×1 IMPLANT
FILTER ARTHROSCOPY CONVERTOR (FILTER) ×2 IMPLANT
GLOVE BIO SURGEON STRL SZ 6.5 (GLOVE) ×2 IMPLANT
GLOVE BIO SURGEON STRL SZ7 (GLOVE) ×6 IMPLANT
GLOVE INDICATOR 6.5 STRL GRN (GLOVE) ×2 IMPLANT
GOWN STRL REUS W/ TWL LRG LVL3 (GOWN DISPOSABLE) ×2 IMPLANT
GOWN STRL REUS W/TWL LRG LVL3 (GOWN DISPOSABLE) ×4 IMPLANT
IV NS IRRIG 3000ML ARTHROMATIC (IV SOLUTION) ×2 IMPLANT
KIT RM TURNOVER CYSTO AR (KITS) ×2 IMPLANT
LEGGING LITHOTOMY PAIR STRL (DRAPES) ×2 IMPLANT
MYOSURE XL FIBROID REM (MISCELLANEOUS)
NEEDLE SPNL 18GX3.5 QUINCKE PK (NEEDLE) ×2 IMPLANT
PACK BASIN DAY SURGERY FS (CUSTOM PROCEDURE TRAY) ×2 IMPLANT
PAD OB MATERNITY 4.3X12.25 (PERSONAL CARE ITEMS) ×2 IMPLANT
PAD PREP 24X48 CUFFED NSTRL (MISCELLANEOUS) ×2 IMPLANT
SEAL ROD LENS SCOPE MYOSURE (ABLATOR) ×2 IMPLANT
SYR CONTROL 10ML LL (SYRINGE) ×2 IMPLANT
SYSTEM TISS REMOVAL MYSR XL RM (MISCELLANEOUS) IMPLANT
TOWEL OR 17X24 6PK STRL BLUE (TOWEL DISPOSABLE) ×4 IMPLANT
TUBE CONNECTING 12X1/4 (SUCTIONS) IMPLANT
TUBING AQUILEX INFLOW (TUBING) ×2 IMPLANT
TUBING AQUILEX OUTFLOW (TUBING) ×2 IMPLANT
WATER STERILE IRR 500ML POUR (IV SOLUTION) IMPLANT

## 2017-01-12 NOTE — Op Note (Signed)
NAMEWILLIS, KUIPERS NO.:  [id number]  MEDICAL RECORD NO.:  [id number]  LOCATION:                                 FACILITY:  PHYSICIAN:  Darlyn Chamber, M.D.        DATE OF BIRTH:  DATE OF PROCEDURE:  01/12/2017 DATE OF DISCHARGE:                              OPERATIVE REPORT   PREOPERATIVE DIAGNOSIS:  Endometrial polyp.  POSTOPERATIVE DIAGNOSIS:  Endometrial polyp.  PROCEDURE:  Paracervical block.  Cervical dilation.  Hysteroscopy with MyoSure resection of multiple endometrial polyps.  Endometrial curettings.  SURGEON:  Darlyn Chamber, M.D.  ANESTHESIA:  Paracervical block with general.  ESTIMATED BLOOD LOSS:  Minimal.  PACKS AND DRAINS:  None.  INTRAOPERATIVE BLOOD PLACED:  None.  COMPLICATIONS:  None.  INDICATION:  Dictated in history and physical.  PROCEDURE IN DETAIL:  The patient was taken to the OR and placed in supine position.  After satisfactory level of general anesthesia obtained, the patient is placed in dorsal position using Allen stirrups. At this point in time, the perineum and vagina were prepped out with Betadine, draped in sterile field.  Bladder was emptied by in-and-out catheterization.  Speculum was placed in the vaginal vault.  The cervix was secured with single-tooth tenaculum.  Paracervical block was achieved using 1% xylocaine.  Uterus sounded to approximately 9 cm. Cervix was serially dilated to a size 23 Pratt dilator.  Hysteroscope was introduced.  The intrauterine cavity was distended using saline. Visualization revealed probably 3 endometrial polyps.  The MyoSure was brought in place.  All endometrial polyps were resected along with endometrium.  In the procedure, all the polyps have been adequately removed.  The hysteroscope was then removed, total deficit 300 mL. Endometrial curettings were then obtained.  At this point in time, single-tooth tenaculum and speculum removed.  The patient was taken out of the  dorsal lithotomy position.  Once alert and extubated, transferred to recovery in good condition.  Sponge, instrument, and needle counts were correct by circulating nurse x2.     Darlyn Chamber, M.D.     JSM/MEDQ  D:  01/12/2017  T:  01/12/2017  Job:  [id number]

## 2017-01-12 NOTE — Discharge Instructions (Signed)
°  Post Anesthesia Home Care Instructions ° °Activity: °Get plenty of rest for the remainder of the day. A responsible individual must stay with you for 24 hours following the procedure.  °For the next 24 hours, DO NOT: °-Drive a car °-Operate machinery °-Drink alcoholic beverages °-Take any medication unless instructed by your physician °-Make any legal decisions or sign important papers. ° °Meals: °Start with liquid foods such as gelatin or soup. Progress to regular foods as tolerated. Avoid greasy, spicy, heavy foods. If nausea and/or vomiting occur, drink only clear liquids until the nausea and/or vomiting subsides. Call your physician if vomiting continues. ° °Special Instructions/Symptoms: °Your throat may feel dry or sore from the anesthesia or the breathing tube placed in your throat during surgery. If this causes discomfort, gargle with warm salt water. The discomfort should disappear within 24 hours. ° °If you had a scopolamine patch placed behind your ear for the management of post- operative nausea and/or vomiting: ° °1. The medication in the patch is effective for 72 hours, after which it should be removed.  Wrap patch in a tissue and discard in the trash. Wash hands thoroughly with soap and water. °2. You may remove the patch earlier than 72 hours if you experience unpleasant side effects which may include dry mouth, dizziness or visual disturbances. °3. Avoid touching the patch. Wash your hands with soap and water after contact with the patch. °    ° °D & C Home care Instructions: ° ° °Personal hygiene:  Used sanitary napkins for vaginal drainage not tampons. Shower or tub bathe the day after your procedure. No douching until bleeding stops. Always wipe from front to back after  Elimination. ° °Activity: Do not drive or operate any equipment today. The effects of the anesthesia are still present and drowsiness may result. Rest today, not necessarily flat bed rest, just take it easy. You may resume your  normal activity in one to 2 days. ° °Sexual activity: No intercourse for one week or as indicated by your physician ° °Diet: Eat a light diet as desired this evening. You may resume a regular diet tomorrow. ° °Return to work: One to 2 days. ° °General Expectations of your surgery: Vaginal bleeding should be no heavier than a normal period. Spotting may continue up to 10 days. Mild cramps may continue for a couple of days. You may have a regular period in 2-6 weeks. ° °Unexpected observations call your doctor if these occur: persistent or heavy bleeding. Severe abdominal cramping or pain. Elevation of temperature greater than 100°F. ° °Call for an appointment in one week. ° ° ° °Patient's Signature_______________________________________________________ ° °Nurse's Signature________________________________________________________  °

## 2017-01-12 NOTE — Anesthesia Procedure Notes (Signed)
Procedure Name: LMA Insertion Date/Time: 01/12/2017 7:29 AM Performed by: Lyndle Herrlich Pre-anesthesia Checklist: Patient identified, Emergency Drugs available, Suction available and Patient being monitored Patient Re-evaluated:Patient Re-evaluated prior to inductionOxygen Delivery Method: Circle system utilized Preoxygenation: Pre-oxygenation with 100% oxygen Intubation Type: IV induction Ventilation: Mask ventilation without difficulty LMA: LMA inserted LMA Size: 4.0 Number of attempts: 1 Airway Equipment and Method: Bite block Placement Confirmation: positive ETCO2 Tube secured with: Tape Dental Injury: Teeth and Oropharynx as per pre-operative assessment

## 2017-01-12 NOTE — Brief Op Note (Signed)
01/12/2017  7:50 AM  PATIENT:  Erika Kim  71 y.o. female  PRE-OPERATIVE DIAGNOSIS:  EM Polyp, PMB  POST-OPERATIVE DIAGNOSIS:  * No post-op diagnosis entered *  PROCEDURE:  Procedure(s): DILATATION & CURETTAGE/HYSTEROSCOPY WITH MYOSURE (N/A)  SURGEON:  Surgeon(s) and Role:    * Arvella Nigh, MD - Primary  PHYSICIAN ASSISTANT:   ASSISTANTS: none   ANESTHESIA:   general and paracervical block  EBL:  No intake/output data recorded.  BLOOD ADMINISTERED:none  DRAINS: none   LOCAL MEDICATIONS USED:  XYLOCAINE   SPECIMEN:  Source of Specimen:  endometrial polyps and currettings  DISPOSITION OF SPECIMEN:  PATHOLOGY  COUNTS:  YES  TOURNIQUET:  * No tourniquets in log *  DICTATION: .Other Dictation: Dictation Number [id number]  PLAN OF CARE: Discharge to home after PACU  PATIENT DISPOSITION:  PACU - hemodynamically stable.   Delay start of Pharmacological VTE agent (>24hrs) due to surgical blood loss or risk of bleeding: not applicable

## 2017-01-12 NOTE — H&P (Signed)
  History and physical exam unchanged 

## 2017-01-12 NOTE — Transfer of Care (Signed)
  Last Vitals:  Vitals:   01/12/17 0605 01/12/17 0803  BP: (!) 153/61 (!) 144/63  Pulse: (!) 56 69  Resp: 18 16  Temp: 36.4 C 36.5 C    Last Pain:  Vitals:   01/12/17 0803  TempSrc:   PainSc: 0-No pain      Patients Stated Pain Goal: 10 (01/12/17 6195)  Immediate Anesthesia Transfer of Care Note  Patient: Erika Kim  Procedure(s) Performed: Procedure(s) (LRB): DILATATION & CURETTAGE/HYSTEROSCOPY WITH MYOSURE (N/A)  Patient Location: PACU  Anesthesia Type: General  Level of Consciousness: awake, alert  and oriented  Airway & Oxygen Therapy: Patient Spontanous Breathing and Patient connected to nasal cannula oxygen  Post-op Assessment: Report given to PACU RN and Post -op Vital signs reviewed and stable  Post vital signs: Reviewed and stable  Complications: No apparent anesthesia complications

## 2017-01-12 NOTE — Anesthesia Postprocedure Evaluation (Signed)
Anesthesia Post Note  Patient: ETTEL ALBERGO  Procedure(s) Performed: Procedure(s) (LRB): DILATATION & CURETTAGE/HYSTEROSCOPY WITH MYOSURE (N/A)  Patient location during evaluation: PACU Anesthesia Type: General Level of consciousness: awake and alert Pain management: pain level controlled Vital Signs Assessment: post-procedure vital signs reviewed and stable Respiratory status: spontaneous breathing, nonlabored ventilation, respiratory function stable and patient connected to nasal cannula oxygen Cardiovascular status: blood pressure returned to baseline and stable Postop Assessment: no signs of nausea or vomiting Anesthetic complications: no       Last Vitals:  Vitals:   01/12/17 0830 01/12/17 0911  BP: (!) 130/54 (!) 144/60  Pulse: 63 (!) 55  Resp: 18 18  Temp:  36.4 C    Last Pain:  Vitals:   01/12/17 0902  TempSrc:   PainSc: 0-No pain                 Marcelus Dubberly EDWARD

## 2017-01-15 ENCOUNTER — Encounter (HOSPITAL_BASED_OUTPATIENT_CLINIC_OR_DEPARTMENT_OTHER): Payer: Self-pay | Admitting: Obstetrics and Gynecology

## 2017-01-24 ENCOUNTER — Encounter: Payer: Self-pay | Admitting: Gynecologic Oncology

## 2017-01-24 ENCOUNTER — Telehealth: Payer: Self-pay | Admitting: Gynecologic Oncology

## 2017-01-24 DIAGNOSIS — Q048 Other specified congenital malformations of brain: Secondary | ICD-10-CM | POA: Diagnosis not present

## 2017-01-24 DIAGNOSIS — I6782 Cerebral ischemia: Secondary | ICD-10-CM | POA: Diagnosis not present

## 2017-01-24 DIAGNOSIS — D333 Benign neoplasm of cranial nerves: Secondary | ICD-10-CM | POA: Diagnosis not present

## 2017-01-24 DIAGNOSIS — H903 Sensorineural hearing loss, bilateral: Secondary | ICD-10-CM | POA: Diagnosis not present

## 2017-01-24 NOTE — Telephone Encounter (Signed)
Tc to Santiago Glad at [facility] to schedule the pt's gyn onc appt. Appt has been scheduled for the pt to see Dr. Denman George on 4/30 at [facility] a vm on Karen's phone with the appt date and time. Will mail a letter to the pt and fax to the referring.

## 2017-01-26 DIAGNOSIS — G4733 Obstructive sleep apnea (adult) (pediatric): Secondary | ICD-10-CM | POA: Diagnosis not present

## 2017-02-06 DIAGNOSIS — R69 Illness, unspecified: Secondary | ICD-10-CM | POA: Diagnosis not present

## 2017-02-08 DIAGNOSIS — E784 Other hyperlipidemia: Secondary | ICD-10-CM | POA: Diagnosis not present

## 2017-02-08 DIAGNOSIS — R8299 Other abnormal findings in urine: Secondary | ICD-10-CM | POA: Diagnosis not present

## 2017-02-08 DIAGNOSIS — Z6838 Body mass index (BMI) 38.0-38.9, adult: Secondary | ICD-10-CM | POA: Diagnosis not present

## 2017-02-08 DIAGNOSIS — I10 Essential (primary) hypertension: Secondary | ICD-10-CM | POA: Diagnosis not present

## 2017-02-08 DIAGNOSIS — G4733 Obstructive sleep apnea (adult) (pediatric): Secondary | ICD-10-CM | POA: Diagnosis not present

## 2017-02-08 DIAGNOSIS — Z1389 Encounter for screening for other disorder: Secondary | ICD-10-CM | POA: Diagnosis not present

## 2017-02-08 DIAGNOSIS — E1151 Type 2 diabetes mellitus with diabetic peripheral angiopathy without gangrene: Secondary | ICD-10-CM | POA: Diagnosis not present

## 2017-02-08 DIAGNOSIS — I480 Paroxysmal atrial fibrillation: Secondary | ICD-10-CM | POA: Diagnosis not present

## 2017-02-08 DIAGNOSIS — D333 Benign neoplasm of cranial nerves: Secondary | ICD-10-CM | POA: Diagnosis not present

## 2017-02-12 ENCOUNTER — Encounter: Payer: Self-pay | Admitting: Gynecologic Oncology

## 2017-02-12 ENCOUNTER — Ambulatory Visit: Payer: Medicare HMO | Attending: Gynecologic Oncology | Admitting: Gynecologic Oncology

## 2017-02-12 VITALS — BP 142/60 | HR 61 | Temp 98.3°F | Resp 18 | Ht 62.64 in | Wt 214.9 lb

## 2017-02-12 DIAGNOSIS — H8110 Benign paroxysmal vertigo, unspecified ear: Secondary | ICD-10-CM | POA: Diagnosis not present

## 2017-02-12 DIAGNOSIS — I480 Paroxysmal atrial fibrillation: Secondary | ICD-10-CM | POA: Diagnosis not present

## 2017-02-12 DIAGNOSIS — Z7901 Long term (current) use of anticoagulants: Secondary | ICD-10-CM | POA: Diagnosis not present

## 2017-02-12 DIAGNOSIS — Z880 Allergy status to penicillin: Secondary | ICD-10-CM | POA: Insufficient documentation

## 2017-02-12 DIAGNOSIS — N840 Polyp of corpus uteri: Secondary | ICD-10-CM | POA: Insufficient documentation

## 2017-02-12 DIAGNOSIS — Z96621 Presence of right artificial elbow joint: Secondary | ICD-10-CM | POA: Diagnosis not present

## 2017-02-12 DIAGNOSIS — M1712 Unilateral primary osteoarthritis, left knee: Secondary | ICD-10-CM | POA: Insufficient documentation

## 2017-02-12 DIAGNOSIS — Z6838 Body mass index (BMI) 38.0-38.9, adult: Secondary | ICD-10-CM | POA: Diagnosis not present

## 2017-02-12 DIAGNOSIS — C541 Malignant neoplasm of endometrium: Secondary | ICD-10-CM | POA: Diagnosis not present

## 2017-02-12 DIAGNOSIS — I4891 Unspecified atrial fibrillation: Secondary | ICD-10-CM | POA: Diagnosis not present

## 2017-02-12 DIAGNOSIS — H903 Sensorineural hearing loss, bilateral: Secondary | ICD-10-CM | POA: Insufficient documentation

## 2017-02-12 DIAGNOSIS — Z888 Allergy status to other drugs, medicaments and biological substances status: Secondary | ICD-10-CM | POA: Insufficient documentation

## 2017-02-12 DIAGNOSIS — E669 Obesity, unspecified: Secondary | ICD-10-CM | POA: Insufficient documentation

## 2017-02-12 DIAGNOSIS — N8502 Endometrial intraepithelial neoplasia [EIN]: Secondary | ICD-10-CM

## 2017-02-12 DIAGNOSIS — Z7902 Long term (current) use of antithrombotics/antiplatelets: Secondary | ICD-10-CM | POA: Insufficient documentation

## 2017-02-12 DIAGNOSIS — G4733 Obstructive sleep apnea (adult) (pediatric): Secondary | ICD-10-CM | POA: Diagnosis not present

## 2017-02-12 DIAGNOSIS — Z9851 Tubal ligation status: Secondary | ICD-10-CM | POA: Diagnosis not present

## 2017-02-12 DIAGNOSIS — E119 Type 2 diabetes mellitus without complications: Secondary | ICD-10-CM | POA: Insufficient documentation

## 2017-02-12 DIAGNOSIS — Z8249 Family history of ischemic heart disease and other diseases of the circulatory system: Secondary | ICD-10-CM | POA: Diagnosis not present

## 2017-02-12 DIAGNOSIS — H905 Unspecified sensorineural hearing loss: Secondary | ICD-10-CM | POA: Insufficient documentation

## 2017-02-12 DIAGNOSIS — I10 Essential (primary) hypertension: Secondary | ICD-10-CM | POA: Insufficient documentation

## 2017-02-12 DIAGNOSIS — Z9889 Other specified postprocedural states: Secondary | ICD-10-CM | POA: Insufficient documentation

## 2017-02-12 DIAGNOSIS — E785 Hyperlipidemia, unspecified: Secondary | ICD-10-CM | POA: Diagnosis not present

## 2017-02-12 DIAGNOSIS — Z96651 Presence of right artificial knee joint: Secondary | ICD-10-CM | POA: Insufficient documentation

## 2017-02-12 DIAGNOSIS — N950 Postmenopausal bleeding: Secondary | ICD-10-CM | POA: Diagnosis not present

## 2017-02-12 NOTE — Progress Notes (Signed)
Consult Note: Gyn-Onc  Consult was requested by Dr. Radene Knee for the evaluation of Erika Kim 71 y.o. female  CC:  Chief Complaint  Patient presents with  . Endometrial Hyperplasia with  Atypia    Assessment/Plan:  Erika Kim  is a 71 y.o.  year old with complex atypical hyperplasia (endometrial cancer stage 0).  We discussed that with this diagnosis the most definitive recommended treatment is robotic assisted total hysterectomy, BSO and SLN (as 40% of these cases are associated with occult invasive malignancy). I discussed the risks associated with surgery including  bleeding, infection, damage to internal organs (such as bladder,ureters, bowels), blood clot, reoperation and rehospitalization. I discussed that bleeding complications are higher for her given her use of Xarelto. I am recommending that she discontinue Xarelto 1 week prior to surgery to ensure that there is no residual iatrogentic coagulopathy. I discussed that this exposes her to a risk for stroke, but that we will administer lovenox (which is shorter acting than xarelto and easier to reverse) perioperatively to decrease this risk). We will restart her on Xarelto when we are assured that her postop bleeding risk is low.  I offered the patient alternatives to surgery including progestin use. The patient declines this and elects for definitive therapy.   HPI: Erika Kim is a 71 year old P2 who is seen in consultation at the request of Dr Radene Knee for endometrial complex atypical hyperplasia.  The patient first began experiencing postmenopausal bleeding in February, 2018. She saw Dr Radene Knee who performed a TVUS on 12/20/16 which showed a uterus measuring 10.1x7.4x5.6cm with a thickened endometrium measuring 1.6cm. A polyp was also seen. The ovaries were grossly normal.   She was taken to the OR for a D&C procedure on 01/12/17 which confirmed simple and complex hyperplasia with focal atypia.   The patient has a  personal history of obesity, OSA, diabetes mellitus and atrial fibrillation for which she takes Xarelto. She has had 2 prior SVD's. Her only prior abdominal surgery is a tubal ligation.  She is planning a knee replacement when she has had her uterine cancer treated.    Current Meds:  Outpatient Encounter Prescriptions as of 02/12/2017  Medication Sig  . acetaminophen (TYLENOL) 500 MG tablet Take 1,000 mg by mouth every 6 (six) hours as needed for mild pain, moderate pain, fever or headache.  Marland Kitchen atorvastatin (LIPITOR) 40 MG tablet Take 1 tablet by mouth every morning.   . calcium carbonate (OS-CAL) 600 MG TABS tablet Take 600 mg by mouth 2 (two) times daily.   . Cholecalciferol (VITAMIN D3) 5000 units TABS Take 1 tablet by mouth daily.  Marland Kitchen ipratropium (ATROVENT) 0.03 % nasal spray Place 2 sprays into the nose 2 (two) times daily as needed for rhinitis.   Marland Kitchen JANUVIA 100 MG tablet Take 1 tablet by mouth daily after supper.  Marland Kitchen lisinopril-hydrochlorothiazide (PRINZIDE,ZESTORETIC) 20-12.5 MG per tablet Take 1 tablet by mouth every morning.   . metoprolol succinate (TOPROL-XL) 50 MG 24 hr tablet Take 1 tablet by mouth 2 (two) times daily.  . Multiple Vitamin (MULTIVITAMIN) tablet Take 1 tablet by mouth daily.  . rivaroxaban (XARELTO) 20 MG TABS tablet Take 1 tablet (20 mg total) by mouth daily with supper.  . saline (AYR) GEL Place 1 application into the nose at bedtime.  . [DISCONTINUED] oxyCODONE-acetaminophen (PERCOCET) 7.5-325 MG tablet Take 1 tablet by mouth every 6 (six) hours as needed for severe pain.   No facility-administered encounter medications on  file as of 02/12/2017.     Allergy:  Allergies  Allergen Reactions  . Cephalosporins Itching    severe  . Augmentin [Amoxicillin-Pot Clavulanate] Itching    severe    Social Hx:   Social History   Social History  . Marital status: Married    Spouse name: N/A  . Number of children: N/A  . Years of education: N/A   Occupational  History  . Not on file.   Social History Main Topics  . Smoking status: Never Smoker  . Smokeless tobacco: Never Used  . Alcohol use No  . Drug use: No  . Sexual activity: Not on file   Other Topics Concern  . Not on file   Social History Narrative   Married w/2 children (boy and girl).  Retired.    Past Surgical Hx:  Past Surgical History:  Procedure Laterality Date  . CARPAL TUNNEL RELEASE Right 04/17/2007   w/ Excision ganglion cyst and Pulley Release right thumb  . DILATATION & CURETTAGE/HYSTEROSCOPY WITH MYOSURE N/A 01/12/2017   Procedure: DILATATION & CURETTAGE/HYSTEROSCOPY WITH MYOSURE;  Surgeon: Arvella Nigh, MD;  Location: [facility];  Service: Gynecology;  Laterality: N/A;  . HAMMER TOE SURGERY Left 2010   left second toe  . KNEE ARTHROSCOPY Right 2010  . KNEE SURGERY Left 1993  . NASAL SINUS SURGERY  1985 and 1995  . ROTATOR CUFF REPAIR Right 04/2004  . TOE SURGERY Right 2012   Great toe and second toe  . TOTAL ELBOW REPLACEMENT Right 2000  . TOTAL KNEE ARTHROPLASTY Right 01/12/2010  . TRANSTHORACIC ECHOCARDIOGRAM  03/28/2016   ef 60-65%/ mild AR and MR  . TUBAL LIGATION Bilateral 1978    Past Medical Hx:  Past Medical History:  Diagnosis Date  . Acoustic neuroma ([place]) followed by dr brown at [facility]   right side w/ chronic roaring noise  . Anticoagulant long-term use    xarelto  . Benign paroxysmal positional vertigo   . Endometrial polyp   . Frequency of urination   . Hyperlipidemia   . Hypertension   . OA (osteoarthritis)    left knee  . OSA on CPAP   . PAF (paroxysmal atrial fibrillation) [facility]) cardiologist-  dr Marlou Porch   dx 04/ 2017  . PMB (postmenopausal bleeding)   . TMJ (temporomandibular joint disorder)    left side-- wears guard  . Type 2 diabetes mellitus ([place])   . Wears contact lenses     Past Gynecological History:  See HPI No LMP recorded. Patient is postmenopausal.  Family Hx:  Family History  Problem Relation  Age of Onset  . Heart failure Father   . Heart failure Mother     Review of Systems:  Constitutional  Feels well,    ENT Normal appearing ears and nares bilaterally Skin/Breast  No rash, sores, jaundice, itching, dryness Cardiovascular  No chest pain, shortness of breath, or edema  Pulmonary  No cough or wheeze.  Gastro Intestinal  No nausea, vomitting, or diarrhoea. No bright red blood per rectum, no abdominal pain, change in bowel movement, or constipation.  Genito Urinary  No frequency, urgency, dysuria, + postmenopausal bleeding Musculo Skeletal  No myalgia, arthralgia, joint swelling or pain  Neurologic  No weakness, numbness, change in gait,  Psychology  No depression, anxiety, insomnia.   Vitals:  Blood pressure (!) 142/60, pulse 61, temperature 98.3 F (36.8 C), temperature source Oral, resp. rate 18, height 5' 2.64" (1.591 m), weight 214 lb 14.4 oz (  97.5 kg).  Physical Exam: WD in NAD Neck  Supple NROM, without any enlargements.  Lymph Node Survey No cervical supraclavicular or inguinal adenopathy Cardiovascular  Pulse normal rate, regularity and rhythm. S1 and S2 normal.  Lungs  Clear to auscultation bilateraly, without wheezes/crackles/rhonchi. Good air movement.  Skin  No rash/lesions/breakdown  Psychiatry  Alert and oriented to person, place, and time  Abdomen  Normoactive bowel sounds, abdomen soft, non-tender and obese without evidence of hernia.  Back No CVA tenderness Genito Urinary  Vulva/vagina: Normal external female genitalia.  No lesions. No discharge or bleeding.  Bladder/urethra:  No lesions or masses, well supported bladder  Vagina: normal  Cervix: Normal appearing, no lesions.  Uterus:  Slightly bulky, mobile, no parametrial involvement or nodularity.  Adnexa: no palpable masses. Rectal  deferred Extremities  No bilateral cyanosis, clubbing or edema.   Donaciano Eva, MD  02/12/2017, 9:40 AM

## 2017-02-12 NOTE — Patient Instructions (Signed)
Preparing for your Surgery  Plan for surgery on Mar 01, 2017 2 730 am  with Dr. Everitt Amber.  Stop your Xarelto 1 week prior to surgery.  None after May 2nd.  Pre-operative Testing -You will receive a phone call from presurgical testing at [facility] to arrange for a pre-operative testing appointment before your surgery.  This appointment normally occurs one to two weeks before your scheduled surgery.   -Bring your insurance card, copy of an advanced directive if applicable, medication list  -At that visit, you will be asked to sign a consent for a possible blood transfusion in case a transfusion becomes necessary during surgery.  The need for a blood transfusion is rare but having consent is a necessary part of your care.     -You should not be taking blood thinners or aspirin at least ten days prior to surgery unless instructed by your surgeon.  Day Before Surgery at [place] will be asked to take in a light diet the day before surgery.  Avoid carbonated beverages.  You will be advised to have nothing to eat or drink after midnight the evening before.     Eat a light diet the day before surgery.  Examples including soups, broths,  toast, yogurt, mashed potatoes.  Things to avoid include carbonated beverages  (fizzy beverages), raw fruits and raw vegetables, or beans.    If your bowels are filled with gas, your surgeon will have difficulty  visualizing your pelvic organs which increases your surgical risks.  Your role in recovery Your role is to become active as soon as directed by your doctor, while still giving yourself time to heal.  Rest when you feel tired. You will be asked to do the following in order to speed your recovery:  - Cough and breathe deeply. This helps toclear and expand your lungs and can prevent pneumonia. You may be given a spirometer to practice deep breathing. A staff member will show you how to use the spirometer. - Do mild physical  activity. Walking or moving your legs help your circulation and body functions return to normal. A staff member will help you when you try to walk and will provide you with simple exercises. Do not try to get up or walk alone the first time. - Actively manage your pain. Managing your pain lets you move in comfort. We will ask you to rate your pain on a scale of zero to 10. It is your responsibility to tell your doctor or nurse where and how much you hurt so your pain can be treated.  Special Considerations -If you are diabetic, you may be placed on insulin after surgery to have closer control over your blood sugars to promote healing and recovery.  This does not mean that you will be discharged on insulin.  If applicable, your oral antidiabetics will be resumed when you are tolerating a solid diet.  -Your final pathology results from surgery should be available by the Friday after surgery and the results will be relayed to you when available.  Eat a light diet the day before surgery.  Examples including soups, broths, toast, yogurt, mashed potatoes.  Things to avoid include carbonated beverages (fizzy beverages), raw fruits and raw vegetables, or beans.   If your bowels are filled with gas, your surgeon will have difficulty visualizing your pelvic organs which increases your surgical risks. Sentinel Lymph Node Biopsy Sentinel lymph node biopsy is a procedure to identify, remove, and examine one  or more lymph nodes for cancer. Lymph nodes are collections of tissue that help filter infections, cancer cells, and other waste substances from the bloodstream. Certain types of cancer can spread to nearby lymph nodes. The cancer usually spreads to one lymph node first, and then to others. The first lymph node that the cancer could spread to is called the sentinel lymph node. In some cases, there may be more than one sentinel lymph node. You may have this procedure to determine whether your cancer has spread and  to help your health care provider plan treatment for you. If no cancer is found in the sentinel lymph node, it is very unlikely that the cancer has spread to any of the other lymph nodes. If cancer is found in the sentinel lymph node, your surgeon may remove additional lymph nodes for examination. Tell a health care provider about:  Any allergies you have, including any history of problems with contrast dye.  All medicines you are taking, including vitamins, herbs, eye drops, creams, and over-the-counter medicines.  Any problems you or family members have had with anesthetic medicines.  Any blood disorders you have.  Any surgeries you have had.  Any medical conditions you have. What are the risks? Generally, sentinel lymph node biopsy is a safe procedure. However, problems can occur and include:  Infection.  Bleeding.  Allergic reaction to the dye used for the procedure.  Staining of the skin where the dye is injected.  Damaged lymph vessels, causing a buildup of fluid (lymphedema).  Pain or bruising at the biopsy site. What happens before the procedure?  If you smoke, stop smoking at least 2 weeks before the procedure. This will improve your health after the procedure and reduce your risk of getting an infection.  You may have blood tests to make sure your blood clots normally.  Ask your health care provider about:  Changing or stopping your regular medicines. This is especially important if you are taking diabetes medicines or blood thinners.  Taking medicines such as aspirin and ibuprofen. These medicines can thin your blood. Do not take these medicines before your procedure if your health care provider asks you not to.  Do not eat or drink anything after midnight on the night before the procedure or as directed by your health care provider.  Plan to have someone take you home after the procedure. What happens during the procedure?  You will be given a medicine that  makes you fall asleep (general anesthetic) or medicine that numbs the surgical area (local anesthetic).  Blue dye or a radioactive substance or both will be injected around the tumor.  The blue dye will reach your lymph node quickly. It may be given just before surgery.  The radioactive substance will take longer to reach your lymph nodes, so it may be given before you go into the operating area.  Both the dye and the radioactive substance will follow the same path that a spreading cancer would be likely to follow.  If a radioactive substance is injected, a scanner will show where the substance has spread to help identify the sentinel lymph node.  The surgeon will make a small incision. If blue dye is injected, your surgeon will look for any lymph nodes that have picked up the dye.  Sentinel lymph nodes will be removed and sent to a lab for examination.  If no cancer is found, no other lymph nodes will be removed. This means it is unlikely the cancer has  spread to other lymph nodes.  If cancer is found, the surgeon will remove other lymph nodes for examination. This may happen during the same procedure or at a later time.  The incision will be closed with stitches or metal clips.  Small adhesive bandages may be used to keep the skin edges close together.  A small dressing may be taped over the incision area. What happens after the procedure?  You will go to a recovery room.  Your urine may be blue for the next 24 hours. This is normal. It is caused by the dye used during the procedure.  Your skin at the injection site may be blue for up to 8 weeks.  You may feel numbness, tingling, or pain near your incision.  You may have swelling or bruising near your incision. This information is not intended to replace advice given to you by your health care provider. Make sure you discuss any questions you have with your health care provider. Document Released: 01/01/2012 Document Revised:  06/22/2016 Document Reviewed: 11/28/2013 Elsevier Interactive Patient Education  2017 [employer] Bilateral Salpingo-Oophorectomy Bilateral salpingo-oophorectomy is the surgical removal of both fallopian tubes and both ovaries. The ovaries are small organs that produce eggs in women. The fallopian tubes transport the egg from the ovary to the womb (uterus). Usually, when this surgery is done, the uterus was previously removed. A bilateral salpingo-oophorectomy may be done to treat cancer or to reduce the risk of cancer in women who are at high risk. Removing both fallopian tubes and both ovaries will make you unable to become pregnant (sterile). It will also put you into menopause so that you will no longer have menstrual periods and may have menopausal symptoms such as hot flashes, night sweats, and mood changes. It will not affect your sex drive. Tell a health care provider about:  Any allergies you have.  All medicines you are taking, including vitamins, herbs, eye drops, creams, and over-the-counter medicines.  Previous problems you or family members have had with anesthetic medicines.  Any blood disorders you have.  Previous surgeries you have had.  Any medical conditions you have. What are the risks? Generally, this is a safe procedure. However, as with any procedure, complications can occur. Possible complications include:  Injury to surrounding organs.  Bleeding.  Infection.  Blood clots in the legs or lungs.  Problems related to anesthesia. What happens before the procedure?  Ask your health care provider about changing or stopping your regular medicines. You may need to stop taking certain medicines, such as aspirin or blood thinners, at least 1 week before the surgery.  Do not eat or drink anything for at least 8 hours before the surgery.  If you smoke, do not smoke for at least 2 weeks before the surgery.  Make plans to have someone drive you home after the  procedure or after your hospital stay. Also arrange for someone to help you with activities during recovery. What happens during the procedure?  You will be given medicine to help you relax before the procedure (sedative). You will then be given medicine to make you sleep through the procedure (general anesthetic). These medicines will be given through an IV access tube that is put into one of your veins.  Once you are asleep, your lower abdomen will be shaved and cleaned. A thin, flexible tube (catheter) will be placed in your bladder.  The surgeon may use a laparoscopic, robotic, or open technique for this surgery:  In  the laparoscopic technique, the surgery is done through two small cuts (incisions) in the abdomen. A thin, lighted tube with a tiny camera on the end (laparoscope) is inserted into one of the incisions. The tools needed for the procedure are put through the other incision.  A robotic technique may be chosen to perform complex surgery in a small space. In the robotic technique, small incisions will be made. A camera and surgical instruments are passed through the incisions. Surgical instruments will be controlled with the help of a robotic arm.  In the open technique, the surgery is done through one large incision in the abdomen.  Using any of these techniques, the surgeon removes the fallopian tubes and ovaries. The blood vessels will be clamped and tied.  The surgeon then uses staples or stitches to close the incision or incisions. What happens after the procedure?  You will be taken to a recovery area where you will be monitored for 1 to 3 hours. Your blood pressure, pulse, and temperature will be checked often. You will remain in the recovery area until you are stable and waking up.  If the laparoscopic technique was used, you may be allowed to go home after several hours. You may have some shoulder pain after the laparoscopic procedure. This is normal and usually goes away  in a day or two.  If the open technique was used, you will be admitted to the hospital for a couple of days.  You will be given pain medicine as needed.  The IV access tube and catheter will be removed before you are discharged. This information is not intended to replace advice given to you by your health care provider. Make sure you discuss any questions you have with your health care provider. Document Released: 10/09/2005 Document Revised: 09/08/2016 Document Reviewed: 04/02/2013 Elsevier Interactive Patient Education  2017 [employer]. Total Laparoscopic Hysterectomy A total laparoscopic hysterectomy is a minimally invasive surgery to remove your uterus and cervix. This surgery is performed by making several small cuts (incisions) in your abdomen. It can also be done with a thin, lighted tube (laparoscope) inserted into two small incisions in your lower abdomen. Your fallopian tubes and ovaries can be removed (bilateral salpingo-oophorectomy) during this surgery as well.Benefits of minimally invasive surgery include:  Less pain.  Less risk of blood loss.  Less risk of infection.  Quicker return to normal activities. Tell a health care provider about:  Any allergies you have.  All medicines you are taking, including vitamins, herbs, eye drops, creams, and over-the-counter medicines.  Any problems you or family members have had with anesthetic medicines.  Any blood disorders you have.  Any surgeries you have had.  Any medical conditions you have. What are the risks? Generally, this is a safe procedure. However, as with any procedure, complications can occur. Possible complications include:  Bleeding.  Blood clots in the legs or lung.  Infection.  Injury to surrounding organs.  Problems with anesthesia.  Early menopause symptoms (hot flashes, night sweats, insomnia).  Risk of conversion to an open abdominal incision. What happens before the procedure?  Ask your  health care provider about changing or stopping your regular medicines.  Do not take aspirin or blood thinners (anticoagulants) for 1 week before the surgery or as told by your health care provider.  Do not eat or drink anything for 8 hours before the surgery or as told by your health care provider.  Quit smoking if you smoke.  Arrange for a  ride home after surgery and for someone to help you at home during recovery. What happens during the procedure?  You will be given antibiotic medicine.  An IV tube will be placed in your arm. You will be given medicine to make you sleep (general anesthetic).  A gas (carbon dioxide) will be used to inflate your abdomen. This will allow your surgeon to look inside your abdomen, perform your surgery, and treat any other problems found if necessary.  Three or four small incisions (often less than 1/2 inch) will be made in your abdomen. One of these incisions will be made in the area of your belly button (navel). The laparoscope will be inserted into the incision. Your surgeon will look through the laparoscope while doing your procedure.  Other surgical instruments will be inserted through the other incisions.  Your uterus may be removed through your vagina or cut into small pieces and removed through the small incisions.  Your incisions will be closed. What happens after the procedure?  The gas will be released from inside your abdomen.  You will be taken to the recovery area where a nurse will watch and check your progress. Once you are awake, stable, and taking fluids well, without other problems, you will return to your room or be allowed to go home.  There is usually minimal discomfort following the surgery because the incisions are so small.  You will be given pain medicine while you are in the hospital and for when you go home. This information is not intended to replace advice given to you by your health care provider. Make sure you discuss  any questions you have with your health care provider. Document Released: 08/06/2007 Document Revised: 03/16/2016 Document Reviewed: 04/29/2013 Elsevier Interactive Patient Education  2017 [employer].

## 2017-02-14 NOTE — Progress Notes (Signed)
Called and spoke with Barbaraann Share at Dr. Serita Grit office and informed her that we are scheduling the patient for a pre-op appointment and need orders place in [facility]. Barbaraann Share stated that she would put the orders in [facility].

## 2017-02-16 NOTE — Patient Instructions (Signed)
LEIANA RUND  02/16/2017   Your procedure is scheduled on: 03-01-17  Report to [facility] Main  Entrance Take [place]  elevators to 3rd floor to  [place] at [phone].   Call this number if you have problems the morning of surgery [phone]   Remember: ONLY 1 PERSON MAY GO WITH YOU TO SHORT STAY TO GET  READY MORNING OF [place].  Do not eat food or drink liquids :After Midnight.     Take these medicines the morning of surgery with A SIP OF WATER: metoprolol, atorvastatin(lipitor), nasal spray and eye drops as needed DO NOT TAKE ANY DIABETIC MEDICATIONS DAY OF YOUR SURGERY                               You may not have any metal on your body including hair pins and              piercings  Do not wear jewelry, make-up, lotions, powders or perfumes, deodorant             Do not wear nail polish.  Do not shave  48 hours prior to surgery.     Do not bring valuables to the hospital. [place].  Contacts, dentures or bridgework may not be worn into surgery.  Leave suitcase in the car. After surgery it may be brought to your room.               Please read over the following fact sheets you were given: _____________________________________________________________________             How to Manage Your Diabetes Before and After Surgery  Why is it important to control my blood sugar before and after surgery? . Improving blood sugar levels before and after surgery helps healing and can limit problems. . A way of improving blood sugar control is eating a healthy diet by: o  Eating less sugar and carbohydrates o  Increasing activity/exercise o  Talking with your doctor about reaching your blood sugar goals . High blood sugars (greater than 180 mg/dL) can raise your risk of infections and slow your recovery, so you will need to focus on controlling your diabetes during the weeks before surgery. . Make  sure that the doctor who takes care of your diabetes knows about your planned surgery including the date and location.  How do I manage my blood sugar before surgery? . Check your blood sugar at least 4 times a day, starting 2 days before surgery, to make sure that the level is not too high or low. o Check your blood sugar the morning of your surgery when you wake up and every 2 hours until you get to the Short Stay unit. . If your blood sugar is less than 70 mg/dL, you will need to treat for low blood sugar: o Do not take insulin. o Treat a low blood sugar (less than 70 mg/dL) with  cup of clear juice (cranberry or apple), 4 glucose tablets, OR glucose gel. o Recheck blood sugar in 15 minutes after treatment (to make sure it is greater than 70 mg/dL). If your blood sugar is not greater than 70 mg/dL on recheck, call [phone] for further instructions. Marland Kitchen  Report your blood sugar to the short stay nurse when you get to Short Stay.  . If you are admitted to the hospital after surgery: o Your blood sugar will be checked by the staff and you will probably be given insulin after surgery (instead of oral diabetes medicines) to make sure you have good blood sugar levels. o The goal for blood sugar control after surgery is 80-180 mg/dL.   WHAT DO I DO ABOUT MY DIABETES MEDICATION?  Marland Kitchen Do not take oral diabetes medicines (pills) the morning of surgery.  . THE DAY BEFORE SURGERY 02-28-17, take JANUVIA as normal      . THE MORNING OF SURGERY 03-01-17 , do not take [place]   Patient Signature:  Date:   Nurse Signature:  Date:   Reviewed and Endorsed by [facility] Patient Education Committee, August 2015  [facility] - Preparing for Surgery Before surgery, you can play an important role.  Because skin is not sterile, your skin needs to be as free of germs as possible.  You can reduce the number of germs on your skin by washing with CHG (chlorahexidine gluconate) soap before surgery.  CHG is an  antiseptic cleaner which kills germs and bonds with the skin to continue killing germs even after washing. Please DO NOT use if you have an allergy to CHG or antibacterial soaps.  If your skin becomes reddened/irritated stop using the CHG and inform your nurse when you arrive at Short Stay. Do not shave (including legs and underarms) for at least 48 hours prior to the first CHG shower.  You may shave your face/neck. Please follow these instructions carefully:  1.  Shower with CHG Soap the night before surgery and the  morning of Surgery.  2.  If you choose to wash your hair, wash your hair first as usual with your  normal  shampoo.  3.  After you shampoo, rinse your hair and body thoroughly to remove the  shampoo.                           4.  Use CHG as you would any other liquid soap.  You can apply chg directly  to the skin and wash                       Gently with a scrungie or clean washcloth.  5.  Apply the CHG Soap to your body ONLY FROM THE NECK DOWN.   Do not use on face/ open                           Wound or open sores. Avoid contact with eyes, ears mouth and genitals (private parts).                       Wash face,  Genitals (private parts) with your normal soap.             6.  Wash thoroughly, paying special attention to the area where your surgery  will be performed.  7.  Thoroughly rinse your body with warm water from the neck down.  8.  DO NOT shower/wash with your normal soap after using and rinsing off  the CHG Soap.                9.  Pat yourself dry with a clean towel.  10.  Wear clean pajamas.            11.  Place clean sheets on your bed the night of your first shower and do not  sleep with pets. Day of Surgery : Do not apply any lotions/deodorants the morning of surgery.  Please wear clean clothes to the hospital/surgery center.  FAILURE TO FOLLOW THESE INSTRUCTIONS MAY RESULT IN THE CANCELLATION OF YOUR  SURGERY  ________________________________________________________________________   Incentive Spirometer  An incentive spirometer is a tool that can help keep your lungs clear and active. This tool measures how well you are filling your lungs with each breath. Taking long deep breaths may help reverse or decrease the chance of developing breathing (pulmonary) problems (especially infection) following:  A long period of time when you are unable to move or be active. BEFORE THE PROCEDURE   If the spirometer includes an indicator to show your best effort, your nurse or respiratory therapist will set it to a desired goal.  If possible, sit up straight or lean slightly forward. Try not to slouch.  Hold the incentive spirometer in an upright position. INSTRUCTIONS FOR USE  1. Sit on the edge of your bed if possible, or sit up as far as you can in bed or on a chair. 2. Hold the incentive spirometer in an upright position. 3. Breathe out normally. 4. Place the mouthpiece in your mouth and seal your lips tightly around it. 5. Breathe in slowly and as deeply as possible, raising the piston or the ball toward the top of the column. 6. Hold your breath for 3-5 seconds or for as long as possible. Allow the piston or ball to fall to the bottom of the column. 7. Remove the mouthpiece from your mouth and breathe out normally. 8. Rest for a few seconds and repeat Steps 1 through 7 at least 10 times every 1-2 hours when you are awake. Take your time and take a few normal breaths between deep breaths. 9. The spirometer may include an indicator to show your best effort. Use the indicator as a goal to work toward during each repetition. 10. After each set of 10 deep breaths, practice coughing to be sure your lungs are clear. If you have an incision (the cut made at the time of surgery), support your incision when coughing by placing a pillow or rolled up towels firmly against it. Once you are able to get out  of bed, walk around indoors and cough well. You may stop using the incentive spirometer when instructed by your caregiver.  RISKS AND COMPLICATIONS  Take your time so you do not get dizzy or light-headed.  If you are in pain, you may need to take or ask for pain medication before doing incentive spirometry. It is harder to take a deep breath if you are having pain. AFTER USE  Rest and breathe slowly and easily.  It can be helpful to keep track of a log of your progress. Your caregiver can provide you with a simple table to help with this. If you are using the spirometer at home, follow these instructions: [place] IF:   You are having difficultly using the spirometer.  You have trouble using the spirometer as often as instructed.  Your pain medication is not giving enough relief while using the spirometer.  You develop fever of 100.5 F (38.1 C) or higher. SEEK IMMEDIATE MEDICAL CARE IF:   You cough up bloody sputum that had not been  present before.  You develop fever of 102 F (38.9 C) or greater.  You develop worsening pain at or near the incision site. MAKE SURE YOU:   Understand these instructions.  Will watch your condition.  Will get help right away if you are not doing well or get worse. Document Released: 02/19/2007 Document Revised: 01/01/2012 Document Reviewed: 04/22/2007 Exit[employer] Patient Information 2014 Exit[employer], [place].   ________________________________________________________________________  WHAT IS A BLOOD TRANSFUSION? Blood Transfusion Information  A transfusion is the replacement of blood or some of its parts. Blood is made up of multiple cells which provide different functions.  Red blood cells carry oxygen and are used for blood loss replacement.  White blood cells fight against infection.  Platelets control bleeding.  Plasma helps clot blood.  Other blood products are available for specialized needs, such as hemophilia or other  clotting disorders. BEFORE THE TRANSFUSION  Who gives blood for transfusions?   Healthy volunteers who are fully evaluated to make sure their blood is safe. This is blood bank blood. Transfusion therapy is the safest it has ever been in the practice of medicine. Before blood is taken from a donor, a complete history is taken to make sure that person has no history of diseases nor engages in risky social behavior (examples are intravenous drug use or sexual activity with multiple partners). The donor's travel history is screened to minimize risk of transmitting infections, such as malaria. The donated blood is tested for signs of infectious diseases, such as HIV and hepatitis. The blood is then tested to be sure it is compatible with you in order to minimize the chance of a transfusion reaction. If you or a relative donates blood, this is often done in anticipation of surgery and is not appropriate for emergency situations. It takes many days to process the donated blood. RISKS AND COMPLICATIONS Although transfusion therapy is very safe and saves many lives, the main dangers of transfusion include:   Getting an infectious disease.  Developing a transfusion reaction. This is an allergic reaction to something in the blood you were given. Every precaution is taken to prevent this. The decision to have a blood transfusion has been considered carefully by your caregiver before blood is given. Blood is not given unless the benefits outweigh the risks. AFTER THE TRANSFUSION  Right after receiving a blood transfusion, you will usually feel much better and more energetic. This is especially true if your red blood cells have gotten low (anemic). The transfusion raises the level of the red blood cells which carry oxygen, and this usually causes an energy increase.  The nurse administering the transfusion will monitor you carefully for complications. HOME CARE INSTRUCTIONS  No special instructions are needed  after a transfusion. You may find your energy is better. Speak with your caregiver about any limitations on activity for underlying diseases you may have. SEEK MEDICAL CARE IF:   Your condition is not improving after your transfusion.  You develop redness or irritation at the intravenous (IV) site. SEEK IMMEDIATE MEDICAL CARE IF:  Any of the following symptoms occur over the next 12 hours:  Shaking chills.  You have a temperature by mouth above 102 F (38.9 C), not controlled by medicine.  Chest, back, or muscle pain.  People around you feel you are not acting correctly or are confused.  Shortness of breath or difficulty breathing.  Dizziness and fainting.  You get a rash or develop hives.  You have a decrease in urine output.  Your urine turns a dark color or changes to pink, red, or brown. Any of the following symptoms occur over the next 10 days:  You have a temperature by mouth above 102 F (38.9 C), not controlled by medicine.  Shortness of breath.  Weakness after normal activity.  The white part of the eye turns yellow (jaundice).  You have a decrease in the amount of urine or are urinating less often.  Your urine turns a dark color or changes to pink, red, or brown. Document Released: 10/06/2000 Document Revised: 01/01/2012 Document Reviewed: 05/25/2008 [facility] Patient Information 2014 [place], [place].  _______________________________________________________________________

## 2017-02-16 NOTE — Progress Notes (Signed)
LOV Dr Philip Aspen 02-08-17 on chart  [facility] cardiology Dr Marlou Porch 09-21-16 epic ECHO 03-28-16 epic

## 2017-02-19 ENCOUNTER — Encounter (HOSPITAL_COMMUNITY): Payer: Self-pay

## 2017-02-19 ENCOUNTER — Telehealth: Payer: Self-pay

## 2017-02-19 ENCOUNTER — Encounter (HOSPITAL_COMMUNITY)
Admission: RE | Admit: 2017-02-19 | Discharge: 2017-02-19 | Disposition: A | Discharge status: Home / Self Care | Payer: Medicare HMO | Source: Ambulatory Visit | Attending: Gynecologic Oncology | Admitting: Gynecologic Oncology

## 2017-02-19 ENCOUNTER — Ambulatory Visit (HOSPITAL_COMMUNITY)
Admission: RE | Admit: 2017-02-19 | Discharge: 2017-02-19 | Disposition: A | Discharge status: Home / Self Care | Payer: Medicare HMO | Source: Ambulatory Visit | Attending: Gynecologic Oncology | Admitting: Gynecologic Oncology

## 2017-02-19 ENCOUNTER — Telehealth: Payer: Self-pay | Admitting: Cardiology

## 2017-02-19 ENCOUNTER — Ambulatory Visit: Payer: Medicare HMO | Admitting: Gynecologic Oncology

## 2017-02-19 DIAGNOSIS — C541 Malignant neoplasm of endometrium: Secondary | ICD-10-CM

## 2017-02-19 DIAGNOSIS — Z0389 Encounter for observation for other suspected diseases and conditions ruled out: Secondary | ICD-10-CM | POA: Diagnosis not present

## 2017-02-19 DIAGNOSIS — Z01818 Encounter for other preprocedural examination: Secondary | ICD-10-CM | POA: Insufficient documentation

## 2017-02-19 DIAGNOSIS — R9431 Abnormal electrocardiogram [ECG] [EKG]: Secondary | ICD-10-CM | POA: Diagnosis not present

## 2017-02-19 DIAGNOSIS — Z0183 Encounter for blood typing: Secondary | ICD-10-CM | POA: Insufficient documentation

## 2017-02-19 DIAGNOSIS — R001 Bradycardia, unspecified: Secondary | ICD-10-CM | POA: Insufficient documentation

## 2017-02-19 DIAGNOSIS — Z01812 Encounter for preprocedural laboratory examination: Secondary | ICD-10-CM | POA: Diagnosis not present

## 2017-02-19 LAB — COMPREHENSIVE METABOLIC PANEL
ALT: 75 U/L — ABNORMAL HIGH (ref 14–54)
AST: 46 U/L — ABNORMAL HIGH (ref 15–41)
Albumin: 4.2 g/dL (ref 3.5–5.0)
Alkaline Phosphatase: 50 U/L (ref 38–126)
Anion gap: 7 (ref 5–15)
BUN: 14 mg/dL (ref 6–20)
CO2: 29 mmol/L (ref 22–32)
Calcium: 9.6 mg/dL (ref 8.9–10.3)
Chloride: 102 mmol/L (ref 101–111)
Creatinine, Ser: 0.71 mg/dL (ref 0.44–1.00)
GFR calc Af Amer: >60 mL/min (ref >60)
GFR calc non Af Amer: >60 mL/min (ref >60)
Glucose, Bld: 141 mg/dL — ABNORMAL HIGH (ref 65–99)
Potassium: 4.1 mmol/L (ref 3.5–5.1)
Sodium: 138 mmol/L (ref 135–145)
Total Bilirubin: 1.1 mg/dL (ref 0.3–1.2)
Total Protein: 6.6 g/dL (ref 6.5–8.1)

## 2017-02-19 LAB — CBC WITH DIFFERENTIAL/PLATELET
Basophils Absolute: 0 10*3/uL (ref 0.0–0.1)
Basophils Relative: 1 %
Eosinophils Absolute: 0.2 10*3/uL (ref 0.0–0.7)
Eosinophils Relative: 3 %
HCT: 40.5 % (ref 36.0–46.0)
Hemoglobin: 13.7 g/dL (ref 12.0–15.0)
Lymphocytes Relative: 30 %
Lymphs Abs: 1.8 10*3/uL (ref 0.7–4.0)
MCH: 29.3 pg (ref 26.0–34.0)
MCHC: 33.8 g/dL (ref 30.0–36.0)
MCV: 86.7 fL (ref 78.0–100.0)
Monocytes Absolute: 0.5 10*3/uL (ref 0.1–1.0)
Monocytes Relative: 8 %
Neutro Abs: 3.6 10*3/uL (ref 1.7–7.7)
Neutrophils Relative %: 60 %
Platelets: 153 10*3/uL (ref 150–400)
RBC: 4.67 MIL/uL (ref 3.87–5.11)
RDW: 13.1 % (ref 11.5–15.5)
WBC: 6.1 10*3/uL (ref 4.0–10.5)

## 2017-02-19 LAB — GLUCOSE, CAPILLARY: Glucose-Capillary: 162 mg/dL — ABNORMAL HIGH (ref 65–99)

## 2017-02-19 LAB — ABO/RH: ABO/RH(D): A POS

## 2017-02-19 NOTE — Telephone Encounter (Signed)
Spoke with patient who is aware to f/u with surgeon about Lovenox injections and when she will be given those.  Dr Marlou Porch recommendation is to hold Xarelto 2 days prior and restart as soon as possible after.  If the surgeon is giving her Lovenox to cover her being off the Xarelto then that is OK.  She will call the surgeons office.

## 2017-02-19 NOTE — Telephone Encounter (Signed)
Erika Kim is calling because she is due to have surgery on 03/01/17 and they( Dr.Rossi)  told her to go off of her Xarelto after Wednesday and not stat back until the 17th and Dr.Skains told her to go off two days prior . She is needing some clarification . Please call   Thanks

## 2017-02-19 NOTE — Telephone Encounter (Signed)
Spoke with Erika Kim and clarified when to stop her Xarelto prior to surgery May 10 th after she discussed with her cardiologist. Dr. Denman George said to take the last dose on Sunday 02-26-16. Dr. Denman George will have her on Lovenox in the hospital. She can drive after iweek post op if she feels up to it and go to the gym 1 month post op as well as power walking. Pt verbalized understanding.

## 2017-02-19 NOTE — Telephone Encounter (Signed)
Attempted to contact pt by phone and received VM.  Lm since Dr Denman George is going to be giving her Lovenox injections she should follow those instructions and go off Xarelto as instructed.  Requested she c/b with further questions or concerns.

## 2017-02-20 LAB — HEMOGLOBIN A1C
Hgb A1c MFr Bld: 5.8 % — ABNORMAL HIGH (ref 4.8–5.6)
Mean Plasma Glucose: 120 mg/dL

## 2017-03-01 ENCOUNTER — Ambulatory Visit (HOSPITAL_COMMUNITY): Payer: Medicare HMO | Admitting: Anesthesiology

## 2017-03-01 ENCOUNTER — Telehealth: Payer: Self-pay | Admitting: *Deleted

## 2017-03-01 ENCOUNTER — Encounter (HOSPITAL_COMMUNITY)
Admission: RE | Disposition: A | Discharge status: Home / Self Care | Payer: Self-pay | Source: Ambulatory Visit | Attending: Gynecologic Oncology

## 2017-03-01 ENCOUNTER — Ambulatory Visit (HOSPITAL_COMMUNITY)
Admission: RE | Admit: 2017-03-01 | Discharge: 2017-03-02 | Disposition: A | Discharge status: Home / Self Care | Payer: Medicare HMO | Source: Ambulatory Visit | Attending: Gynecologic Oncology | Admitting: Gynecologic Oncology

## 2017-03-01 ENCOUNTER — Encounter (HOSPITAL_COMMUNITY): Payer: Self-pay

## 2017-03-01 DIAGNOSIS — Z299 Encounter for prophylactic measures, unspecified: Secondary | ICD-10-CM

## 2017-03-01 DIAGNOSIS — Z7984 Long term (current) use of oral hypoglycemic drugs: Secondary | ICD-10-CM | POA: Insufficient documentation

## 2017-03-01 DIAGNOSIS — Z9989 Dependence on other enabling machines and devices: Secondary | ICD-10-CM | POA: Diagnosis not present

## 2017-03-01 DIAGNOSIS — E785 Hyperlipidemia, unspecified: Secondary | ICD-10-CM | POA: Diagnosis not present

## 2017-03-01 DIAGNOSIS — N9489 Other specified conditions associated with female genital organs and menstrual cycle: Secondary | ICD-10-CM | POA: Insufficient documentation

## 2017-03-01 DIAGNOSIS — I10 Essential (primary) hypertension: Secondary | ICD-10-CM | POA: Diagnosis not present

## 2017-03-01 DIAGNOSIS — E119 Type 2 diabetes mellitus without complications: Secondary | ICD-10-CM | POA: Insufficient documentation

## 2017-03-01 DIAGNOSIS — Z79899 Other long term (current) drug therapy: Secondary | ICD-10-CM | POA: Diagnosis not present

## 2017-03-01 DIAGNOSIS — R0683 Snoring: Secondary | ICD-10-CM | POA: Diagnosis not present

## 2017-03-01 DIAGNOSIS — N8502 Endometrial intraepithelial neoplasia [EIN]: Secondary | ICD-10-CM | POA: Insufficient documentation

## 2017-03-01 DIAGNOSIS — I480 Paroxysmal atrial fibrillation: Secondary | ICD-10-CM | POA: Diagnosis not present

## 2017-03-01 DIAGNOSIS — Z96621 Presence of right artificial elbow joint: Secondary | ICD-10-CM | POA: Insufficient documentation

## 2017-03-01 DIAGNOSIS — N800 Endometriosis of uterus: Secondary | ICD-10-CM | POA: Diagnosis not present

## 2017-03-01 DIAGNOSIS — C541 Malignant neoplasm of endometrium: Secondary | ICD-10-CM | POA: Diagnosis not present

## 2017-03-01 DIAGNOSIS — Z7901 Long term (current) use of anticoagulants: Secondary | ICD-10-CM | POA: Insufficient documentation

## 2017-03-01 DIAGNOSIS — N840 Polyp of corpus uteri: Secondary | ICD-10-CM | POA: Diagnosis not present

## 2017-03-01 DIAGNOSIS — Z96651 Presence of right artificial knee joint: Secondary | ICD-10-CM | POA: Insufficient documentation

## 2017-03-01 DIAGNOSIS — G4733 Obstructive sleep apnea (adult) (pediatric): Secondary | ICD-10-CM | POA: Insufficient documentation

## 2017-03-01 DIAGNOSIS — Z792 Long term (current) use of antibiotics: Secondary | ICD-10-CM

## 2017-03-01 HISTORY — PX: ROBOTIC ASSISTED TOTAL HYSTERECTOMY WITH BILATERAL SALPINGO OOPHERECTOMY: SHX6086

## 2017-03-01 HISTORY — PX: LYMPH NODE BIOPSY: SHX201

## 2017-03-01 LAB — TYPE AND SCREEN
ABO/RH(D): A POS
ANTIBODY SCREEN: NEGATIVE

## 2017-03-01 LAB — GLUCOSE, CAPILLARY
GLUCOSE-CAPILLARY: 208 mg/dL — AB (ref 65–99)
Glucose-Capillary: 101 mg/dL — ABNORMAL HIGH (ref 65–99)
Glucose-Capillary: 143 mg/dL — ABNORMAL HIGH (ref 65–99)

## 2017-03-01 SURGERY — HYSTERECTOMY, TOTAL, ROBOT-ASSISTED, LAPAROSCOPIC, WITH BILATERAL SALPINGO-OOPHORECTOMY
Anesthesia: General

## 2017-03-01 MED ORDER — STERILE WATER FOR INJECTION IJ SOLN
INTRAMUSCULAR | Status: AC
  Filled 2017-03-01: qty 10

## 2017-03-01 MED ORDER — INSULIN ASPART 100 UNIT/ML ~~LOC~~ SOLN
0.0000 [IU] | Freq: Three times a day (TID) | SUBCUTANEOUS | Status: DC
  Administered 2017-03-01: 5 [IU] via SUBCUTANEOUS

## 2017-03-01 MED ORDER — PROMETHAZINE HCL 25 MG/ML IJ SOLN: 6.2500 mg | INTRAMUSCULAR | Status: DC | PRN

## 2017-03-01 MED ORDER — SUGAMMADEX SODIUM 200 MG/2ML IV SOLN
INTRAVENOUS | Status: DC | PRN
  Administered 2017-03-01: 200 mg via INTRAVENOUS

## 2017-03-01 MED ORDER — FENTANYL CITRATE (PF) 250 MCG/5ML IJ SOLN
INTRAMUSCULAR | Status: DC | PRN
  Administered 2017-03-01 (×2): 50 ug via INTRAVENOUS

## 2017-03-01 MED ORDER — DEXAMETHASONE SODIUM PHOSPHATE 10 MG/ML IJ SOLN
INTRAMUSCULAR | Status: AC
  Filled 2017-03-01: qty 1

## 2017-03-01 MED ORDER — LISINOPRIL-HYDROCHLOROTHIAZIDE 20-12.5 MG PO TABS: 1.0000 | ORAL_TABLET | Freq: Every morning | ORAL | Status: DC

## 2017-03-01 MED ORDER — CLINDAMYCIN PHOSPHATE 900 MG/50ML IV SOLN
900.0000 mg | INTRAVENOUS | Status: AC
  Administered 2017-03-01: 900 mg via INTRAVENOUS
  Filled 2017-03-01: qty 50

## 2017-03-01 MED ORDER — STERILE WATER FOR IRRIGATION IR SOLN
Status: DC | PRN
  Administered 2017-03-01: 1000 mL

## 2017-03-01 MED ORDER — LACTATED RINGERS IR SOLN
Status: DC | PRN
  Administered 2017-03-01: 1000 mL

## 2017-03-01 MED ORDER — CIPROFLOXACIN IN D5W 400 MG/200ML IV SOLN
INTRAVENOUS | Status: AC
Start: 2017-03-01 — End: 2017-03-01
  Filled 2017-03-01: qty 200

## 2017-03-01 MED ORDER — ONDANSETRON HCL 4 MG/2ML IJ SOLN
INTRAMUSCULAR | Status: AC
  Filled 2017-03-01: qty 2

## 2017-03-01 MED ORDER — ONDANSETRON HCL 4 MG/2ML IJ SOLN
INTRAMUSCULAR | Status: DC | PRN
  Administered 2017-03-01: 4 mg via INTRAVENOUS

## 2017-03-01 MED ORDER — ONDANSETRON HCL 4 MG/2ML IJ SOLN: 4.0000 mg | Freq: Four times a day (QID) | INTRAMUSCULAR | Status: DC | PRN

## 2017-03-01 MED ORDER — HYDROMORPHONE HCL 1 MG/ML IJ SOLN: 0.2000 mg | INTRAMUSCULAR | Status: DC | PRN

## 2017-03-01 MED ORDER — CIPROFLOXACIN IN D5W 400 MG/200ML IV SOLN
400.0000 mg | INTRAVENOUS | Status: AC
  Administered 2017-03-01: 400 mg via INTRAVENOUS
  Filled 2017-03-01: qty 200

## 2017-03-01 MED ORDER — ATORVASTATIN CALCIUM 40 MG PO TABS
40.0000 mg | ORAL_TABLET | Freq: Every morning | ORAL | Status: DC
  Administered 2017-03-02: 40 mg via ORAL
  Filled 2017-03-01: qty 1

## 2017-03-01 MED ORDER — LIDOCAINE 2% (20 MG/ML) 5 ML SYRINGE
INTRAMUSCULAR | Status: DC | PRN
  Administered 2017-03-01: 100 mg via INTRAVENOUS

## 2017-03-01 MED ORDER — KCL IN DEXTROSE-NACL 20-5-0.45 MEQ/L-%-% IV SOLN
INTRAVENOUS | Status: DC
  Administered 2017-03-01: 1000 mL via INTRAVENOUS
  Administered 2017-03-02: 08:00:00 via INTRAVENOUS
  Filled 2017-03-01: qty 1000

## 2017-03-01 MED ORDER — LISINOPRIL 20 MG PO TABS
20.0000 mg | ORAL_TABLET | Freq: Every day | ORAL | Status: DC
  Administered 2017-03-02: 20 mg via ORAL
  Filled 2017-03-01: qty 1

## 2017-03-01 MED ORDER — PROPOFOL 10 MG/ML IV BOLUS
INTRAVENOUS | Status: AC
  Filled 2017-03-01: qty 20

## 2017-03-01 MED ORDER — KCL IN DEXTROSE-NACL 20-5-0.45 MEQ/L-%-% IV SOLN
INTRAVENOUS | Status: AC
  Administered 2017-03-01: 1000 mL via INTRAVENOUS
  Filled 2017-03-01: qty 1000

## 2017-03-01 MED ORDER — LACTATED RINGERS IV SOLN
INTRAVENOUS | Status: DC
  Administered 2017-03-01: 07:00:00 via INTRAVENOUS

## 2017-03-01 MED ORDER — ENOXAPARIN SODIUM 40 MG/0.4ML ~~LOC~~ SOLN
40.0000 mg | SUBCUTANEOUS | Status: AC
  Administered 2017-03-01: 40 mg via SUBCUTANEOUS
  Filled 2017-03-01: qty 0.4

## 2017-03-01 MED ORDER — HYDROCHLOROTHIAZIDE 12.5 MG PO CAPS
12.5000 mg | ORAL_CAPSULE | Freq: Every day | ORAL | Status: DC
  Administered 2017-03-02: 12.5 mg via ORAL
  Filled 2017-03-01: qty 1

## 2017-03-01 MED ORDER — PROPOFOL 10 MG/ML IV BOLUS
INTRAVENOUS | Status: DC | PRN
Start: 2017-03-01 — End: 2017-03-01
  Administered 2017-03-01: 150 mg via INTRAVENOUS

## 2017-03-01 MED ORDER — ROCURONIUM BROMIDE 50 MG/5ML IV SOSY
PREFILLED_SYRINGE | INTRAVENOUS | Status: DC | PRN
  Administered 2017-03-01: 50 mg via INTRAVENOUS

## 2017-03-01 MED ORDER — ENOXAPARIN SODIUM 40 MG/0.4ML ~~LOC~~ SOLN
40.0000 mg | SUBCUTANEOUS | Status: DC
  Administered 2017-03-02: 40 mg via SUBCUTANEOUS

## 2017-03-01 MED ORDER — IBUPROFEN 400 MG PO TABS: 800.0000 mg | ORAL_TABLET | Freq: Three times a day (TID) | ORAL | Status: DC | PRN

## 2017-03-01 MED ORDER — TRAMADOL HCL 50 MG PO TABS: 100.0000 mg | ORAL_TABLET | Freq: Two times a day (BID) | ORAL | Status: DC | PRN

## 2017-03-01 MED ORDER — LIDOCAINE 2% (20 MG/ML) 5 ML SYRINGE
INTRAMUSCULAR | Status: AC
  Filled 2017-03-01: qty 5

## 2017-03-01 MED ORDER — SUCCINYLCHOLINE CHLORIDE 200 MG/10ML IV SOSY
PREFILLED_SYRINGE | INTRAVENOUS | Status: AC
Start: 2017-03-01 — End: 2017-03-01
  Filled 2017-03-01: qty 10

## 2017-03-01 MED ORDER — ROCURONIUM BROMIDE 50 MG/5ML IV SOSY
PREFILLED_SYRINGE | INTRAVENOUS | Status: AC
  Filled 2017-03-01: qty 5

## 2017-03-01 MED ORDER — HYDROMORPHONE HCL 1 MG/ML IJ SOLN
INTRAMUSCULAR | Status: AC
  Administered 2017-03-01: 0.5 mg via INTRAVENOUS
  Filled 2017-03-01: qty 1

## 2017-03-01 MED ORDER — METOPROLOL SUCCINATE ER 50 MG PO TB24
50.0000 mg | ORAL_TABLET | Freq: Two times a day (BID) | ORAL | Status: DC
  Administered 2017-03-01 – 2017-03-02 (×2): 50 mg via ORAL
  Filled 2017-03-01 (×2): qty 1

## 2017-03-01 MED ORDER — ONDANSETRON HCL 4 MG PO TABS: 4.0000 mg | ORAL_TABLET | Freq: Four times a day (QID) | ORAL | Status: DC | PRN

## 2017-03-01 MED ORDER — HYDROMORPHONE HCL 1 MG/ML IJ SOLN
0.2500 mg | INTRAMUSCULAR | Status: DC | PRN
  Administered 2017-03-01 (×2): 0.5 mg via INTRAVENOUS

## 2017-03-01 MED ORDER — CLINDAMYCIN PHOSPHATE 900 MG/50ML IV SOLN
INTRAVENOUS | Status: AC
  Filled 2017-03-01: qty 50

## 2017-03-01 MED ORDER — SUCCINYLCHOLINE CHLORIDE 200 MG/10ML IV SOSY
PREFILLED_SYRINGE | INTRAVENOUS | Status: DC | PRN
  Administered 2017-03-01: 100 mg via INTRAVENOUS

## 2017-03-01 MED ORDER — SUGAMMADEX SODIUM 200 MG/2ML IV SOLN
INTRAVENOUS | Status: AC
  Filled 2017-03-01: qty 2

## 2017-03-01 MED ORDER — OXYCODONE-ACETAMINOPHEN 5-325 MG PO TABS
1.0000 | ORAL_TABLET | ORAL | Status: DC | PRN
  Administered 2017-03-01: 1 via ORAL
  Filled 2017-03-01: qty 1

## 2017-03-01 MED ORDER — FENTANYL CITRATE (PF) 250 MCG/5ML IJ SOLN
INTRAMUSCULAR | Status: AC
  Filled 2017-03-01: qty 5

## 2017-03-01 MED ORDER — DEXAMETHASONE SODIUM PHOSPHATE 10 MG/ML IJ SOLN
INTRAMUSCULAR | Status: DC | PRN
Start: 2017-03-01 — End: 2017-03-01
  Administered 2017-03-01: 10 mg via INTRAVENOUS

## 2017-03-01 SURGICAL SUPPLY — 59 items
APPLICATOR SURGIFLO ENDO (HEMOSTASIS) IMPLANT
BAG LAPAROSCOPIC 12 15 PORT 16 (BASKET) IMPLANT
BAG RETRIEVAL 12/15 (BASKET)
CHLORAPREP W/TINT 26ML (MISCELLANEOUS) ×3 IMPLANT
COVER BACK TABLE 60X90IN (DRAPES) ×3 IMPLANT
COVER SURGICAL LIGHT HANDLE (MISCELLANEOUS) ×3 IMPLANT
COVER TIP SHEARS 8 DVNC (MISCELLANEOUS) ×2 IMPLANT
COVER TIP SHEARS 8MM DA VINCI (MISCELLANEOUS) ×1
DERMABOND ADVANCED (GAUZE/BANDAGES/DRESSINGS) ×1
DERMABOND ADVANCED .7 DNX12 (GAUZE/BANDAGES/DRESSINGS) ×2 IMPLANT
DRAPE ARM DVNC X/XI (DISPOSABLE) ×8 IMPLANT
DRAPE COLUMN DVNC XI (DISPOSABLE) ×2 IMPLANT
DRAPE DA VINCI XI ARM (DISPOSABLE) ×4
DRAPE DA VINCI XI COLUMN (DISPOSABLE) ×1
DRAPE SHEET LG 3/4 BI-LAMINATE (DRAPES) ×6 IMPLANT
DRAPE SURG IRRIG POUCH 19X23 (DRAPES) ×3 IMPLANT
ELECT REM PT RETURN 15FT ADLT (MISCELLANEOUS) ×3 IMPLANT
GLOVE BIO SURGEON STRL SZ 6 (GLOVE) ×12 IMPLANT
GLOVE BIO SURGEON STRL SZ 6.5 (GLOVE) ×6 IMPLANT
GOWN STRL REUS W/ TWL LRG LVL3 (GOWN DISPOSABLE) ×4 IMPLANT
GOWN STRL REUS W/TWL LRG LVL3 (GOWN DISPOSABLE) ×2
HOLDER FOLEY CATH W/STRAP (MISCELLANEOUS) ×3 IMPLANT
IRRIG SUCT STRYKERFLOW 2 WTIP (MISCELLANEOUS) ×3
IRRIGATION SUCT STRKRFLW 2 WTP (MISCELLANEOUS) ×2 IMPLANT
KIT BASIN OR (CUSTOM PROCEDURE TRAY) ×3 IMPLANT
KIT PROCEDURE DA VINCI SI (MISCELLANEOUS) ×1
KIT PROCEDURE DVNC SI (MISCELLANEOUS) ×2 IMPLANT
MANIPULATOR UTERINE 4.5 ZUMI (MISCELLANEOUS) ×3 IMPLANT
MARKER SKIN DUAL TIP RULER LAB (MISCELLANEOUS) ×3 IMPLANT
NDL SAFETY ECLIPSE 18X1.5 (NEEDLE) ×2 IMPLANT
NEEDLE HYPO 18GX1.5 SHARP (NEEDLE) ×1
NEEDLE SPNL 18GX3.5 QUINCKE PK (NEEDLE) ×3 IMPLANT
OBTURATOR OPTICAL STANDARD 8MM (TROCAR) ×1
OBTURATOR OPTICAL STND 8 DVNC (TROCAR) ×2
OBTURATOR OPTICALSTD 8 DVNC (TROCAR) ×2 IMPLANT
OCCLUDER COLPOPNEUMO (BALLOONS) ×3 IMPLANT
PAD POSITIONING PINK XL (MISCELLANEOUS) ×3 IMPLANT
PORT ACCESS TROCAR AIRSEAL 12 (TROCAR) ×2 IMPLANT
PORT ACCESS TROCAR AIRSEAL 5M (TROCAR) ×1
POUCH SPECIMEN RETRIEVAL 10MM (ENDOMECHANICALS) IMPLANT
SEAL CANN UNIV 5-8 DVNC XI (MISCELLANEOUS) ×8 IMPLANT
SEAL XI 5MM-8MM UNIVERSAL (MISCELLANEOUS) ×4
SET TRI-LUMEN FLTR TB AIRSEAL (TUBING) ×3 IMPLANT
SHEET LAVH (DRAPES) ×3 IMPLANT
SOLUTION ELECTROLUBE (MISCELLANEOUS) ×3 IMPLANT
SURGIFLO W/THROMBIN 8M KIT (HEMOSTASIS) IMPLANT
SUT MNCRL AB 4-0 PS2 18 (SUTURE) ×6 IMPLANT
SUT VIC AB 0 CT1 27 (SUTURE) ×1
SUT VIC AB 0 CT1 27XBRD ANTBC (SUTURE) ×2 IMPLANT
SYR 10ML LL (SYRINGE) ×3 IMPLANT
SYR 50ML LL SCALE MARK (SYRINGE) ×3 IMPLANT
TOWEL OR 17X26 10 PK STRL BLUE (TOWEL DISPOSABLE) ×6 IMPLANT
TOWEL OR NON WOVEN STRL DISP B (DISPOSABLE) ×3 IMPLANT
TRAP SPECIMEN MUCOUS 40CC (MISCELLANEOUS) IMPLANT
TRAY FOLEY W/METER SILVER 16FR (SET/KITS/TRAYS/PACK) ×3 IMPLANT
TRAY LAPAROSCOPIC (CUSTOM PROCEDURE TRAY) ×3 IMPLANT
TROCAR BLADELESS OPT 5 100 (ENDOMECHANICALS) ×3 IMPLANT
UNDERPAD 30X30 (UNDERPADS AND DIAPERS) ×6 IMPLANT
WATER STERILE IRR 1500ML POUR (IV SOLUTION) ×3 IMPLANT

## 2017-03-01 NOTE — Telephone Encounter (Signed)
I have scheduled patient's post op appt. It will print on her discharge summary

## 2017-03-01 NOTE — Anesthesia Preprocedure Evaluation (Signed)
Anesthesia Evaluation  Patient identified by MRN, date of birth, ID band Patient awake    Reviewed: Allergy & Precautions, NPO status , Patient's Chart, lab work & pertinent test results  Airway Mallampati: II  TM Distance: >3 FB Neck ROM: Full    Dental no notable dental hx.    Pulmonary sleep apnea and Continuous Positive Airway Pressure Ventilation ,    Pulmonary exam normal breath sounds clear to auscultation       Cardiovascular hypertension, Normal cardiovascular exam+ dysrhythmias Atrial Fibrillation  Rhythm:Regular Rate:Normal     Neuro/Psych negative neurological ROS  negative psych ROS   GI/Hepatic negative GI ROS, Neg liver ROS,   Endo/Other  diabetes  Renal/GU negative Renal ROS  negative genitourinary   Musculoskeletal negative musculoskeletal ROS (+)   Abdominal   Peds negative pediatric ROS (+)  Hematology negative hematology ROS (+)   Anesthesia Other Findings   Reproductive/Obstetrics negative OB ROS                             Anesthesia Physical Anesthesia Plan  ASA: III  Anesthesia Plan: General   Post-op Pain Management:    Induction: Intravenous  Airway Management Planned: Oral ETT  Additional Equipment:   Intra-op Plan:   Post-operative Plan: Extubation in OR  Informed Consent: I have reviewed the patients History and Physical, chart, labs and discussed the procedure including the risks, benefits and alternatives for the proposed anesthesia with the patient or authorized representative who has indicated his/her understanding and acceptance.   Dental advisory given  Plan Discussed with: CRNA and Surgeon  Anesthesia Plan Comments:         Anesthesia Quick Evaluation

## 2017-03-01 NOTE — Progress Notes (Signed)
Pt. placed on CPAP of 8 per home setting/ramp, brought in own hose, nasal pillows added humidity and 1 lpm, was on 1 lpm upon my arrival, sats 95%, tolerating well, RT to monitor.

## 2017-03-01 NOTE — H&P (View-Only) (Signed)
Consult Note: Gyn-Onc  Consult was requested by Dr. Radene Knee for the evaluation of Erika Kim 71 y.o. female  CC:  Chief Complaint  Patient presents with  . Endometrial Hyperplasia with  Atypia    Assessment/Plan:  Erika Kim  is a 71 y.o.  year old with complex atypical hyperplasia (endometrial cancer stage 0).  We discussed that with this diagnosis the most definitive recommended treatment is robotic assisted total hysterectomy, BSO and SLN (as 40% of these cases are associated with occult invasive malignancy). I discussed the risks associated with surgery including  bleeding, infection, damage to internal organs (such as bladder,ureters, bowels), blood clot, reoperation and rehospitalization. I discussed that bleeding complications are higher for her given her use of Xarelto. I am recommending that she discontinue Xarelto 1 week prior to surgery to ensure that there is no residual iatrogentic coagulopathy. I discussed that this exposes her to a risk for stroke, but that we will administer lovenox (which is shorter acting than xarelto and easier to reverse) perioperatively to decrease this risk). We will restart her on Xarelto when we are assured that her postop bleeding risk is low.  I offered the patient alternatives to surgery including progestin use. The patient declines this and elects for definitive therapy.   HPI: Erika Kim is a 71 year old P2 who is seen in consultation at the request of Dr Radene Knee for endometrial complex atypical hyperplasia.  The patient first began experiencing postmenopausal bleeding in February, 2018. She saw Dr Radene Knee who performed a TVUS on 12/20/16 which showed a uterus measuring 10.1x7.4x5.6cm with a thickened endometrium measuring 1.6cm. A polyp was also seen. The ovaries were grossly normal.   She was taken to the OR for a D&C procedure on 01/12/17 which confirmed simple and complex hyperplasia with focal atypia.   The patient has a  personal history of obesity, OSA, diabetes mellitus and atrial fibrillation for which she takes Xarelto. She has had 2 prior SVD's. Her only prior abdominal surgery is a tubal ligation.  She is planning a knee replacement when she has had her uterine cancer treated.    Current Meds:  Outpatient Encounter Prescriptions as of 02/12/2017  Medication Sig  . acetaminophen (TYLENOL) 500 MG tablet Take 1,000 mg by mouth every 6 (six) hours as needed for mild pain, moderate pain, fever or headache.  Marland Kitchen atorvastatin (LIPITOR) 40 MG tablet Take 1 tablet by mouth every morning.   . calcium carbonate (OS-CAL) 600 MG TABS tablet Take 600 mg by mouth 2 (two) times daily.   . Cholecalciferol (VITAMIN D3) 5000 units TABS Take 1 tablet by mouth daily.  Marland Kitchen ipratropium (ATROVENT) 0.03 % nasal spray Place 2 sprays into the nose 2 (two) times daily as needed for rhinitis.   Marland Kitchen JANUVIA 100 MG tablet Take 1 tablet by mouth daily after supper.  Marland Kitchen lisinopril-hydrochlorothiazide (PRINZIDE,ZESTORETIC) 20-12.5 MG per tablet Take 1 tablet by mouth every morning.   . metoprolol succinate (TOPROL-XL) 50 MG 24 hr tablet Take 1 tablet by mouth 2 (two) times daily.  . Multiple Vitamin (MULTIVITAMIN) tablet Take 1 tablet by mouth daily.  . rivaroxaban (XARELTO) 20 MG TABS tablet Take 1 tablet (20 mg total) by mouth daily with supper.  . saline (AYR) GEL Place 1 application into the nose at bedtime.  . [DISCONTINUED] oxyCODONE-acetaminophen (PERCOCET) 7.5-325 MG tablet Take 1 tablet by mouth every 6 (six) hours as needed for severe pain.   No facility-administered encounter medications on  file as of 02/12/2017.     Allergy:  Allergies  Allergen Reactions  . Cephalosporins Itching    severe  . Augmentin [Amoxicillin-Pot Clavulanate] Itching    severe    Social Hx:   Social History   Social History  . Marital status: Married    Spouse name: N/A  . Number of children: N/A  . Years of education: N/A   Occupational  History  . Not on file.   Social History Main Topics  . Smoking status: Never Smoker  . Smokeless tobacco: Never Used  . Alcohol use No  . Drug use: No  . Sexual activity: Not on file   Other Topics Concern  . Not on file   Social History Narrative   Married w/2 children (boy and girl).  Retired.    Past Surgical Hx:  Past Surgical History:  Procedure Laterality Date  . CARPAL TUNNEL RELEASE Right 04/17/2007   w/ Excision ganglion cyst and Pulley Release right thumb  . DILATATION & CURETTAGE/HYSTEROSCOPY WITH MYOSURE N/A 01/12/2017   Procedure: DILATATION & CURETTAGE/HYSTEROSCOPY WITH MYOSURE;  Surgeon: Arvella Nigh, MD;  Location: [place];  Service: Gynecology;  Laterality: N/A;  . HAMMER TOE SURGERY Left 2010   left second toe  . KNEE ARTHROSCOPY Right 2010  . KNEE SURGERY Left 1993  . NASAL SINUS SURGERY  1985 and 1995  . ROTATOR CUFF REPAIR Right 04/2004  . TOE SURGERY Right 2012   Great toe and second toe  . TOTAL ELBOW REPLACEMENT Right 2000  . TOTAL KNEE ARTHROPLASTY Right 01/12/2010  . TRANSTHORACIC ECHOCARDIOGRAM  03/28/2016   ef 60-65%/ mild AR and MR  . TUBAL LIGATION Bilateral 1978    Past Medical Hx:  Past Medical History:  Diagnosis Date  . Acoustic neuroma ([place]) followed by dr brown at [facility]   right side w/ chronic roaring noise  . Anticoagulant long-term use    xarelto  . Benign paroxysmal positional vertigo   . Endometrial polyp   . Frequency of urination   . Hyperlipidemia   . Hypertension   . OA (osteoarthritis)    left knee  . OSA on CPAP   . PAF (paroxysmal atrial fibrillation) [facility]) cardiologist-  dr Marlou Porch   dx 04/ 2017  . PMB (postmenopausal bleeding)   . TMJ (temporomandibular joint disorder)    left side-- wears guard  . Type 2 diabetes mellitus ([place])   . Wears contact lenses     Past Gynecological History:  See HPI No LMP recorded. Patient is postmenopausal.  Family Hx:  Family History  Problem Relation  Age of Onset  . Heart failure Father   . Heart failure Mother     Review of Systems:  Constitutional  Feels well,    ENT Normal appearing ears and nares bilaterally Skin/Breast  No rash, sores, jaundice, itching, dryness Cardiovascular  No chest pain, shortness of breath, or edema  Pulmonary  No cough or wheeze.  Gastro Intestinal  No nausea, vomitting, or diarrhoea. No bright red blood per rectum, no abdominal pain, change in bowel movement, or constipation.  Genito Urinary  No frequency, urgency, dysuria, + postmenopausal bleeding Musculo Skeletal  No myalgia, arthralgia, joint swelling or pain  Neurologic  No weakness, numbness, change in gait,  Psychology  No depression, anxiety, insomnia.   Vitals:  Blood pressure (!) 142/60, pulse 61, temperature 98.3 F (36.8 C), temperature source Oral, resp. rate 18, height 5' 2.64" (1.591 m), weight 214 lb 14.4 oz (  97.5 kg).  Physical Exam: WD in NAD Neck  Supple NROM, without any enlargements.  Lymph Node Survey No cervical supraclavicular or inguinal adenopathy Cardiovascular  Pulse normal rate, regularity and rhythm. S1 and S2 normal.  Lungs  Clear to auscultation bilateraly, without wheezes/crackles/rhonchi. Good air movement.  Skin  No rash/lesions/breakdown  Psychiatry  Alert and oriented to person, place, and time  Abdomen  Normoactive bowel sounds, abdomen soft, non-tender and obese without evidence of hernia.  Back No CVA tenderness Genito Urinary  Vulva/vagina: Normal external female genitalia.  No lesions. No discharge or bleeding.  Bladder/urethra:  No lesions or masses, well supported bladder  Vagina: normal  Cervix: Normal appearing, no lesions.  Uterus:  Slightly bulky, mobile, no parametrial involvement or nodularity.  Adnexa: no palpable masses. Rectal  deferred Extremities  No bilateral cyanosis, clubbing or edema.   Erika Eva, MD  02/12/2017, 9:40 AM

## 2017-03-01 NOTE — Op Note (Signed)
OPERATIVE NOTE 03/01/17  Surgeon: Donaciano Eva   Assistants: Dr Lahoma Crocker (an MD assistant was necessary for tissue manipulation, management of robotic instrumentation, retraction and positioning due to the complexity of the case and hospital policies).   Anesthesia: General endotracheal anesthesia  ASA Class: 3   Pre-operative Diagnosis: complex atypical hyperplasia (Endometrial cancer stage 0)  Post-operative Diagnosis: same  Operation: Robotic-assisted laparoscopic total hysterectomy with bilateral salpingoophorectomy, SLN biopsy   Surgeon: Donaciano Eva  Assistant Surgeon: Lahoma Crocker MD  Anesthesia: GET  Urine Output: 300cc  Operative Findings:  : 10cm uterus, grossly normal, normal cervix tubes and ovaries, no suspicious nodes.  Estimated Blood Loss:  less than 50 mL      Total IV Fluids: 600 ml         Specimens: uterus, cervix, bilateral tubes and ovaries, left and right obturator SLN         Complications:  None; patient tolerated the procedure well.         Disposition: PACU - hemodynamically stable.  Procedure Details  The patient was seen in the Holding Room. The risks, benefits, complications, treatment options, and expected outcomes were discussed with the patient.  The patient concurred with the proposed plan, giving informed consent.  The site of surgery properly noted/marked. The patient was identified as Erika Kim and the procedure verified as a Robotic-assisted hysterectomy with bilateral salpingo oophorectomy. A Time Out was held and the above information confirmed.  After induction of anesthesia, the patient was draped and prepped in the usual sterile manner. Pt was placed in supine position after anesthesia and draped and prepped in the usual sterile manner. The abdominal drape was placed after the CholoraPrep had been allowed to dry for 3 minutes.  Her arms were tucked to her side with all appropriate precautions.  The  shoulders were stabilized with padded shoulder blocks applied to the acromium processes.  The patient was placed in the semi-lithotomy position in [place].  The perineum was prepped with Betadine. The patient was then prepped. Foley catheter was placed.  A sterile speculum was placed in the vagina.  The cervix was grasped with a single-tooth tenaculum and dilated with Kennon Rounds dilators.  1mg  total of ICG was injected into the cervical stroma at 2 and 9 o'clock at a 80mm depth (concentration 0..5mg /ml). The ZUMI uterine manipulator with a medium colpotomizer ring was placed without difficulty.  A pneum occluder balloon was placed over the manipulator.  OG tube placement was confirmed and to suction.   Next, a 5 mm skin incision was made 1 cm below the subcostal margin in the midclavicular line.  The 5 mm Optiview port and scope was used for direct entry.  Opening pressure was under 10 mm CO2.  The abdomen was insufflated and the findings were noted as above.   At this point and all points during the procedure, the patient's intra-abdominal pressure did not exceed 15 mmHg. Next, a 10 mm skin incision was made in the umbilicus and a right and left port was placed about 10 cm lateral to the robot port on the right and left side.  A fourth arm was placed in the left lower quadrant 2 cm above and superior and medial to the anterior superior iliac spine.  All ports were placed under direct visualization.  The patient was placed in steep Trendelenburg.  Bowel was folded away into the upper abdomen.  The robot was docked in the normal manner.  The  right and left peritoneum were opened parallel to the IP ligament to open the retroperitoneal spaces bilaterally. The SLN mapping was performed in bilateral pelvic basins. The para rectal and paravesical spaces were opened up. Lymphatic channels were identified travelling to the following visualized sentinel lymph node's: right and left obturator SLN's. These SLN's were  separated from their surrounding lymphatic tissue, removed and sent for permanent pathology.  The hysterectomy was started after the round ligament on the right side was incised and the retroperitoneum was entered and the pararectal space was developed.  The ureter was noted to be on the medial leaf of the broad ligament.  The peritoneum above the ureter was incised and stretched and the infundibulopelvic ligament was skeletonized, cauterized and cut.  The posterior peritoneum was taken down to the level of the KOH ring.  The anterior peritoneum was also taken down.  The bladder flap was created to the level of the KOH ring.  The uterine artery on the right side was skeletonized, cauterized and cut in the normal manner.  A similar procedure was performed on the left.  The colpotomy was made and the uterus, cervix, bilateral ovaries and tubes were amputated and delivered through the vagina.  Pedicles were inspected and excellent hemostasis was achieved.    The colpotomy at the vaginal cuff was closed with Vicryl on a CT1 needle in a running manner.  Irrigation was used and excellent hemostasis was achieved.  At this point in the procedure was completed.  Robotic instruments were removed under direct visulaization.  The robot was undocked. The 10 mm ports were closed with Vicryl on a UR-5 needle and the fascia was closed with 0 Vicryl on a UR-5 needle.  The skin was closed with 4-0 Vicryl in a subcuticular manner.  Dermabond was applied.  Sponge, lap and needle counts correct x 2.  The patient was taken to the recovery room in stable condition.  The vagina was swabbed with  minimal bleeding noted.   All instrument and needle counts were correct x  3.   The patient was transferred to the recovery room in a stable condition.  Donaciano Eva, MD

## 2017-03-01 NOTE — Interval H&P Note (Signed)
History and Physical Interval Note:  03/01/2017 7:21 AM  Erika Kim  has presented today for surgery, with the diagnosis of ENDOMETRIAL CANCER  The various methods of treatment have been discussed with the patient and family. After consideration of risks, benefits and other options for treatment, the patient has consented to  Procedure(s): XI ROBOTIC ASSISTED TOTAL HYSTERECTOMY WITH BILATERAL SALPINGO OOPHORECTOMY (Bilateral) SENTINEL LYMPH NODE BIOPSY (N/A) as a surgical intervention .  The patient's history has been reviewed, patient examined, no change in status, stable for surgery.  I have reviewed the patient's chart and labs.  Questions were answered to the patient's satisfaction.     Donaciano Eva

## 2017-03-01 NOTE — Transfer of Care (Signed)
Immediate Anesthesia Transfer of Care Note  Patient: Erika Kim  Procedure(s) Performed: Procedure(s): XI ROBOTIC ASSISTED TOTAL HYSTERECTOMY WITH BILATERAL SALPINGO OOPHORECTOMY (Bilateral) SENTINEL LYMPH NODE BIOPSY (N/A)  Patient Location: PACU  Anesthesia Type:General  Level of Consciousness: awake and patient cooperative  Airway & Oxygen Therapy: Patient Spontanous Breathing and Patient connected to face mask oxygen  Post-op Assessment: Report given to RN and Post -op Vital signs reviewed and stable  Post vital signs: Reviewed and stable  Last Vitals:  Vitals:   03/01/17 0526  BP: (!) 148/75  Pulse: (!) 58  Resp: 16  Temp: 36.7 C    Last Pain:  Vitals:   03/01/17 0549  TempSrc:   PainSc: 0-No pain      Patients Stated Pain Goal: 4 (37/79/39 [id number])  Complications: No apparent anesthesia complications

## 2017-03-01 NOTE — Anesthesia Postprocedure Evaluation (Signed)
Anesthesia Post Note  Patient: Erika Kim  Procedure(s) Performed: Procedure(s) (LRB): XI ROBOTIC ASSISTED TOTAL HYSTERECTOMY WITH BILATERAL SALPINGO OOPHORECTOMY (Bilateral) SENTINEL LYMPH NODE BIOPSY (N/A)  Patient location during evaluation: PACU Anesthesia Type: General Level of consciousness: awake and alert Pain management: pain level controlled Vital Signs Assessment: post-procedure vital signs reviewed and stable Respiratory status: spontaneous breathing, nonlabored ventilation, respiratory function stable and patient connected to nasal cannula oxygen Cardiovascular status: blood pressure returned to baseline and stable Postop Assessment: no signs of nausea or vomiting Anesthetic complications: no       Last Vitals:  Vitals:   03/01/17 1000 03/01/17 1015  BP: (!) 129/58 (!) 137/56  Pulse: (!) 55 (!) 55  Resp: 12 16  Temp:  36.6 C    Last Pain:  Vitals:   03/01/17 1015  TempSrc:   PainSc: 2                  [name]

## 2017-03-01 NOTE — Discharge Instructions (Signed)
03/01/2017  Return to work: 4 weeks  Activity: 1. Be up and out of the bed during the day.  Take a nap if needed.  You may walk up steps but be careful and use the hand rail.  Stair climbing will tire you more than you think, you may need to stop part way and rest.   2. No lifting or straining for 6 weeks.  3. No driving for 1 weeks.  Do Not drive if you are taking narcotic pain medicine.  4. Shower daily.  Use soap and water on your incision and pat dry; don't rub.   5. No sexual activity and nothing in the vagina for 8 weeks.  Medications:  - Take ibuprofen and tylenol first line for pain control. Take these regularly (every 6 hours) to decrease the build up of pain.  - If necessary, for severe pain not relieved by ibuprofen, take percocet.  - While taking percocet you should take sennakot every night to reduce the likelihood of constipation. If this causes diarrhea, stop its use.  - do not start taking your Xarelto until 48 hours have passed from surgery (the morning of 03/03/17)  Diet: 1. Low sodium Heart Healthy Diet is recommended.  2. It is safe to use a laxative if you have difficulty moving your bowels.   Wound Care: 1. Keep clean and dry.  Shower daily.  Reasons to call the Doctor:   Fever - Oral temperature greater than 100.4 degrees Fahrenheit  Foul-smelling vaginal discharge  Difficulty urinating  Nausea and vomiting  Increased pain at the site of the incision that is unrelieved with pain medicine.  Difficulty breathing with or without chest pain  New calf pain especially if only on one side  Sudden, continuing increased vaginal bleeding with or without clots.   Follow-up: 1. See Everitt Amber in 3 weeks.  Contacts: For questions or concerns you should contact:  Dr. Everitt Amber at [phone] After hours and on week-ends call [phone] and ask to speak to the physician on call for Gynecologic Oncology

## 2017-03-01 NOTE — Anesthesia Procedure Notes (Signed)
Procedure Name: Intubation Date/Time: 03/01/2017 7:37 AM Performed by: Dione Booze Pre-anesthesia Checklist: Emergency Drugs available, Suction available, Patient being monitored and Patient identified Patient Re-evaluated:Patient Re-evaluated prior to inductionOxygen Delivery Method: Circle system utilized Preoxygenation: Pre-oxygenation with 100% oxygen Intubation Type: IV induction Laryngoscope Size: Mac and 4 Grade View: Grade I Tube type: Oral Tube size: 7.5 mm Number of attempts: 1 Airway Equipment and Method: Stylet Placement Confirmation: ETT inserted through vocal cords under direct vision,  positive ETCO2 and breath sounds checked- equal and bilateral Secured at: 21 cm Tube secured with: Tape Dental Injury: Teeth and Oropharynx as per pre-operative assessment  Comments: No change in dentition. Partial plate intact.

## 2017-03-02 ENCOUNTER — Encounter (HOSPITAL_COMMUNITY): Payer: Self-pay | Admitting: Gynecologic Oncology

## 2017-03-02 DIAGNOSIS — Z96651 Presence of right artificial knee joint: Secondary | ICD-10-CM | POA: Diagnosis not present

## 2017-03-02 DIAGNOSIS — I480 Paroxysmal atrial fibrillation: Secondary | ICD-10-CM | POA: Diagnosis not present

## 2017-03-02 DIAGNOSIS — N9489 Other specified conditions associated with female genital organs and menstrual cycle: Secondary | ICD-10-CM | POA: Diagnosis not present

## 2017-03-02 DIAGNOSIS — N8502 Endometrial intraepithelial neoplasia [EIN]: Secondary | ICD-10-CM | POA: Diagnosis not present

## 2017-03-02 DIAGNOSIS — G4733 Obstructive sleep apnea (adult) (pediatric): Secondary | ICD-10-CM | POA: Diagnosis not present

## 2017-03-02 DIAGNOSIS — Z96621 Presence of right artificial elbow joint: Secondary | ICD-10-CM | POA: Diagnosis not present

## 2017-03-02 DIAGNOSIS — Z79899 Other long term (current) drug therapy: Secondary | ICD-10-CM | POA: Diagnosis not present

## 2017-03-02 DIAGNOSIS — R69 Illness, unspecified: Secondary | ICD-10-CM | POA: Diagnosis not present

## 2017-03-02 DIAGNOSIS — Z7984 Long term (current) use of oral hypoglycemic drugs: Secondary | ICD-10-CM | POA: Diagnosis not present

## 2017-03-02 DIAGNOSIS — E119 Type 2 diabetes mellitus without complications: Secondary | ICD-10-CM | POA: Diagnosis not present

## 2017-03-02 DIAGNOSIS — Z7901 Long term (current) use of anticoagulants: Secondary | ICD-10-CM | POA: Diagnosis not present

## 2017-03-02 LAB — GLUCOSE, CAPILLARY
Glucose-Capillary: 204 mg/dL — ABNORMAL HIGH (ref 65–99)
Glucose-Capillary: 83 mg/dL (ref 65–99)

## 2017-03-02 LAB — BASIC METABOLIC PANEL
ANION GAP: 9 (ref 5–15)
BUN: 11 mg/dL (ref 6–20)
CALCIUM: 9.1 mg/dL (ref 8.9–10.3)
CO2: 23 mmol/L (ref 22–32)
Chloride: 104 mmol/L (ref 101–111)
Creatinine, Ser: 0.68 mg/dL (ref 0.44–1.00)
GFR calc Af Amer: >60 mL/min (ref >60)
GFR calc non Af Amer: >60 mL/min (ref >60)
Glucose, Bld: 160 mg/dL — ABNORMAL HIGH (ref 65–99)
Potassium: 4.1 mmol/L (ref 3.5–5.1)
Sodium: 136 mmol/L (ref 135–145)

## 2017-03-02 LAB — CBC
HEMATOCRIT: 40.4 % (ref 36.0–46.0)
Hemoglobin: 13.4 g/dL (ref 12.0–15.0)
MCH: 28.8 pg (ref 26.0–34.0)
MCHC: 33.2 g/dL (ref 30.0–36.0)
MCV: 86.7 fL (ref 78.0–100.0)
Platelets: 155 10*3/uL (ref 150–400)
RBC: 4.66 MIL/uL (ref 3.87–5.11)
RDW: 12.8 % (ref 11.5–15.5)
WBC: 12.4 10*3/uL — AB (ref 4.0–10.5)

## 2017-03-02 MED ORDER — OXYCODONE-ACETAMINOPHEN 5-325 MG PO TABS: 1.0000 | ORAL_TABLET | ORAL | 0 refills | Status: DC | PRN

## 2017-03-02 NOTE — Discharge Summary (Signed)
Physician Discharge Summary  Patient ID: Erika Kim MRN: [id number] DOB/AGE: [birth year] 69 y.o.  Admit date: 03/01/2017 Discharge date: 03/02/2017  Admission Diagnoses: Endometrial cancer [facility])  Discharge Diagnoses:  Principal Problem:   Endometrial cancer [facility])   Discharged Condition:  The patient is in good condition and stable for discharge.     Hospital Course: On 03/01/2017, the patient underwent the following: Procedure(s): XI ROBOTIC ASSISTED TOTAL HYSTERECTOMY WITH BILATERAL SALPINGO OOPHORECTOMY SENTINEL LYMPH NODE BIOPSY.   The postoperative course was uneventful.  She was discharged to home on postoperative day 1 tolerating a regular diet, ambulating, voiding, passing flatus, with minimal pain reported.  Consults: None  Significant Diagnostic Studies: None  Treatments: surgery: see above  Discharge Exam: Blood pressure 127/87, pulse 73, temperature 99.1 F (37.3 C), temperature source Oral, resp. rate 16, height 5\' 3"  (1.6 m), weight 167 lb (75.8 kg), SpO2 98 %. General appearance: alert, cooperative and no distress Resp: clear to auscultation bilaterally Cardio: regular rate and rhythm, S1, S2 normal, no murmur, click, rub or gallop GI: soft, non-tender; bowel sounds normal; no masses,  no organomegaly Extremities: extremities normal, atraumatic, no cyanosis or edema Incision/Wound: Lap sites with dermabond without erythema or drainage.  Mild ecchymosis noted around lap sites.  Disposition: 01-Home or Self Care  Discharge Instructions    Call MD for:  difficulty breathing, headache or visual disturbances    Complete by:  As directed    Call MD for:  extreme fatigue    Complete by:  As directed    Call MD for:  hives    Complete by:  As directed    Call MD for:  persistant dizziness or light-headedness    Complete by:  As directed    Call MD for:  persistant nausea and vomiting    Complete by:  As directed    Call MD for:  redness, tenderness, or signs  of infection (pain, swelling, redness, odor or green/yellow discharge around incision site)    Complete by:  As directed    Call MD for:  severe uncontrolled pain    Complete by:  As directed    Call MD for:  temperature >100.4    Complete by:  As directed    Diet - low sodium heart healthy    Complete by:  As directed    Discharge instructions    Complete by:  As directed    Begin xarelto on May 12, Saturday   Driving Restrictions    Complete by:  As directed    No driving for 1 week.  Do not take narcotics and drive.   Increase activity slowly    Complete by:  As directed    Lifting restrictions    Complete by:  As directed    No lifting greater than 10 lbs.   Sexual Activity Restrictions    Complete by:  As directed    No sexual activity, nothing in the vagina, for 8 weeks.     Allergies as of 03/02/2017      Reactions   Cephalosporins Itching   severe   Augmentin [amoxicillin-pot Clavulanate] Itching   severe      Medication List    TAKE these medications   acetaminophen 500 MG tablet Commonly known as:  TYLENOL Take 500-1,000 mg by mouth daily as needed for mild pain, moderate pain, fever or headache.   atorvastatin 40 MG tablet Commonly known as:  LIPITOR Take 1 tablet by mouth every morning.  calcium carbonate 600 MG Tabs tablet Commonly known as:  OS-CAL Take 600 mg by mouth 2 (two) times daily.   ipratropium 0.03 % nasal spray Commonly known as:  ATROVENT Place 1-2 sprays into the nose at bedtime as needed for rhinitis.   JANUVIA 100 MG tablet Generic drug:  sitaGLIPtin Take 1 tablet by mouth daily after supper.   lisinopril-hydrochlorothiazide 20-12.5 MG tablet Commonly known as:  PRINZIDE,ZESTORETIC Take 1 tablet by mouth every morning.   metoprolol succinate 50 MG 24 hr tablet Commonly known as:  TOPROL-XL Take 1 tablet by mouth See admin instructions. Takes twice daily, may take a third time if heart goes into a-fib   multivitamin  tablet Take 1 tablet by mouth daily.   oxyCODONE-acetaminophen 5-325 MG tablet Commonly known as:  PERCOCET/ROXICET Take 1-2 tablets by mouth every 4 (four) hours as needed (moderate to severe pain).   rivaroxaban 20 MG Tabs tablet Commonly known as:  XARELTO Take 1 tablet (20 mg total) by mouth daily with supper.   saline Gel Place 1 application into the nose at bedtime.   SYSTANE OP Place 1 drop into both eyes daily as needed (dry eyes).   Vitamin D3 5000 units Tabs Take 1 tablet by mouth daily.        Greater than thirty minutes were spend for face to face discharge instructions and discharge orders/summary in EPIC.   Signed: Shamyra Farias DEAL 03/02/2017, 10:01 AM

## 2017-03-06 ENCOUNTER — Telehealth: Payer: Self-pay

## 2017-03-06 NOTE — Telephone Encounter (Signed)
Told Ms Stevison the results of the pathology as noted below by Dr. Denman George.

## 2017-03-06 NOTE — Telephone Encounter (Signed)
-----   Message from Everitt Amber, MD sent at 03/05/2017 12:00 PM EDT ----- Would you mind letting Erika Kim know that her pathology revealed only hyperplasia and no invasive cancer. No additional therapy or follow-up for this condition is necessary after her follow-up visit. Thanks Everitt Amber

## 2017-03-12 ENCOUNTER — Telehealth: Payer: Self-pay

## 2017-03-12 NOTE — Telephone Encounter (Signed)
Erika Kim called stating that she began with vaginal bleeding yesterday.  The blood is bright red.  She used 3 pads yesterday.   Reviewed with Dr. Denman George.  The Xarelto may be contributing to her bleeding.   She is to call back if bleeding is heavier as the Xarelto  May need to be held. Pt verbalized understanding.    The bleeding as of 1000 today is lighter then yesterday.

## 2017-03-12 NOTE — Progress Notes (Signed)
Cardiology Office Note    Date:  03/15/2017   ID:  Erika Kim, DOB 05-03-1946, MRN [id number]  PCP:  [name], MD  Cardiologist:  Dr. Marlou Porch  CC: follow up  History of Present Illness:  Erika Kim is a 71 y.o. female with a history of PAF on Xarelto, DMT2, HTN, HLD, OSA on CPAP and recently diagnosed endometrial cancer s/p total hysterectomy, BSO and SLN who presents to clinic for follow up.   She was last seen by Dr. Marlou Porch in 08/2016 and doing well at that time. Since that time she has been diagnosed with complex atypical hyperplasia (endometrial cancer stage 0) and has undergone robotic assisted total hysterectomy, BSO and SLN on 03/02/17 by Dr. Denman George. She was restarted on Xarelto on 03/03/17.  Today she presents to clinic for regularly scheduled follow up. She has been feeling well but having some bleeding after her surgery. She had some palpitations about week ago at night. It lasted about 24 hours. She took an extra Metoprolol. She can feel the fluttering and HR stays in 90s (usually in the 60s).   No CP or SOB. No LE edema, orthopnea or PND. No dizziness or syncope. No blood in stool or urine. She has vaginal bleeding. Occasional palpitations.     Past Medical History:  Diagnosis Date  . Acoustic neuroma ([place]) followed by dr brown at [facility]   right side w/ chronic roaring noise  . Anticoagulant long-term use    xarelto  . Benign paroxysmal positional vertigo   . Endometrial polyp   . Frequency of urination   . Hyperlipidemia   . Hypertension   . OA (osteoarthritis)    left knee  . OSA on CPAP   . PAF (paroxysmal atrial fibrillation) [facility]) cardiologist-  dr Marlou Porch   dx 04/ 2017  . PMB (postmenopausal bleeding)   . TMJ (temporomandibular joint disorder)    left side-- wears guard  . Type 2 diabetes mellitus ([place])   . Wears contact lenses     Past Surgical History:  Procedure Laterality Date  . CARPAL TUNNEL RELEASE Right 04/17/2007   w/ Excision  ganglion cyst and Pulley Release right thumb  . DILATATION & CURETTAGE/HYSTEROSCOPY WITH MYOSURE N/A 01/12/2017   Procedure: DILATATION & CURETTAGE/HYSTEROSCOPY WITH MYOSURE;  Surgeon: Arvella Nigh, MD;  Location: [facility];  Service: Gynecology;  Laterality: N/A;  . HAMMER TOE SURGERY Left 2010   left second toe  . KNEE ARTHROSCOPY Right 2010  . KNEE SURGERY Left 1993  . LYMPH NODE BIOPSY N/A 03/01/2017   Procedure: SENTINEL LYMPH NODE BIOPSY;  Surgeon: Everitt Amber, MD;  Location: [facility];  Service: Gynecology;  Laterality: N/A;  . NASAL SINUS SURGERY  1985 and 1995  . ROBOTIC ASSISTED TOTAL HYSTERECTOMY WITH BILATERAL SALPINGO OOPHERECTOMY Bilateral 03/01/2017   Procedure: XI ROBOTIC ASSISTED TOTAL HYSTERECTOMY WITH BILATERAL SALPINGO OOPHORECTOMY;  Surgeon: Everitt Amber, MD;  Location: [facility];  Service: Gynecology;  Laterality: Bilateral;  . ROTATOR CUFF REPAIR Right 04/2004  . TOE SURGERY Right 2012   Great toe and second toe  . TOTAL ELBOW REPLACEMENT Right 2000  . TOTAL KNEE ARTHROPLASTY Right 01/12/2010  . TRANSTHORACIC ECHOCARDIOGRAM  03/28/2016   ef 60-65%/ mild AR and MR  . TUBAL LIGATION Bilateral 1978    Current Medications: Outpatient Medications Prior to Visit  Medication Sig Dispense Refill  . acetaminophen (TYLENOL) 500 MG tablet Take 500-1,000 mg by mouth daily as needed for mild pain, moderate pain,  fever or headache.     [facility] atorvastatin (LIPITOR) 40 MG tablet Take 1 tablet by mouth every morning.     . calcium carbonate (OS-CAL) 600 MG TABS tablet Take 600 mg by mouth 2 (two) times daily.     . Cholecalciferol (VITAMIN D3) 5000 units TABS Take 1 tablet by mouth daily.    [facility] ipratropium (ATROVENT) 0.03 % nasal spray Place 1-2 sprays into the nose at bedtime as needed for rhinitis.     [facility] JANUVIA 100 MG tablet Take 1 tablet by mouth daily after supper.    [facility] lisinopril-hydrochlorothiazide (PRINZIDE,ZESTORETIC) 20-12.5 MG per tablet Take 1 tablet by mouth every  morning.     . metoprolol succinate (TOPROL-XL) 50 MG 24 hr tablet Take 1 tablet by mouth See admin instructions. Takes twice daily, may take a third time if heart goes into a-fib    . Multiple Vitamin (MULTIVITAMIN) tablet Take 1 tablet by mouth daily.    Vladimir Faster Glycol-Propyl Glycol (SYSTANE OP) Place 1 drop into both eyes daily as needed (dry eyes).    . rivaroxaban (XARELTO) 20 MG TABS tablet Take 1 tablet (20 mg total) by mouth daily with supper. 90 tablet 3  . saline (AYR) GEL Place 1 application into the nose at bedtime.    [facility] oxyCODONE-acetaminophen (PERCOCET/ROXICET) 5-325 MG tablet Take 1-2 tablets by mouth every 4 (four) hours as needed (moderate to severe pain). 10 tablet 0   No facility-administered medications prior to visit.      Allergies:   Cephalosporins and Augmentin [amoxicillin-pot clavulanate]   Social History   Social History  . Marital status: Married    Spouse name: N/A  . Number of children: N/A  . Years of education: N/A   Social History Main Topics  . Smoking status: Never Smoker  . Smokeless tobacco: Never Used  . Alcohol use No  . Drug use: No  . Sexual activity: Not Asked   Other Topics Concern  . None   Social History Narrative   Married w/2 children (boy and girl).  Retired.     Family History:  The patient's family history includes Heart failure in her father and mother.      ROS:   Please see the history of present illness.    ROS All other systems reviewed and are negative.   PHYSICAL EXAM:   VS:  BP (!) 152/78   Pulse 60   Ht 5\' 3"  (1.6 m)   Wt 210 lb 6.4 oz (95.4 kg)   BMI 37.27 kg/m    GEN: Well nourished, well developed, in no acute distress, obese HEENT: normal  Neck: no JVD, carotid bruits, or masses Cardiac: RRR; no murmurs, rubs, or gallops,no edema  Respiratory:  clear to auscultation bilaterally, normal work of breathing GI: soft, nontender, nondistended, + BS MS: no deformity or atrophy  Skin: warm and dry, no  rash Neuro:  Alert and Oriented x 3, Strength and sensation are intact Psych: euthymic mood, full affect    Wt Readings from Last 3 Encounters:  03/15/17 210 lb 6.4 oz (95.4 kg)  03/01/17 167 lb (75.8 kg)  02/19/17 167 lb (75.8 kg)      Studies/Labs Reviewed:   EKG:  EKG is NOT ordered today.    Recent Labs: 02/19/2017: ALT 75 03/02/2017: BUN 11; Creatinine, Ser 0.68; Hemoglobin 13.4; Platelets 155; Potassium 4.1; Sodium 136   Lipid Panel No results found for: CHOL, TRIG, HDL, CHOLHDL, VLDL, LDLCALC, LDLDIRECT  Additional studies/  records that were reviewed today include:  2D ECHO: 03/28/2016 LV EF: 60% -   65% Study Conclusions - Left ventricle: The cavity size was normal. Systolic function was   normal. The estimated ejection fraction was in the range of 60%   to 65%. Wall motion was normal; there were no regional wall   motion abnormalities. The transmitral flow pattern was normal.   Left ventricular diastolic function parameters were normal. - Aortic valve: Moderate focal calcification, consistent with   sclerosis. There was mild regurgitation. - Mitral valve: There was mild regurgitation.   ASSESSMENT & PLAN:   PAF: sounds regular on exam. Continue Xarelto for CHADSVASC of at least 3 (HTN, DM, F sex). Continue Toprol XL 50mg  BID. She takes an extra PRN for palpitations  OSA: she is complaint with CPAP  Obesity: Body mass index is 37.27 kg/m. counseled on the importance of diet and exercise.   HTN: BP a little elevated today but was normal at home. She says she has a history of white coat HTN. Continue current regimen   HLD: continue statin   DMT2: continue current regimen   Medication Adjustments/Labs and Tests Ordered: Current medicines are reviewed at length with the patient today.  Concerns regarding medicines are outlined above.  Medication changes, Labs and Tests ordered today are listed in the Patient Instructions below. Patient Instructions    Medication Instructions:  Your physician recommends that you continue on your current medications as directed. Please refer to the Current Medication list given to you today. You have been given samples of Xarelto to help you out.  These should last 28 days.  Labwork: None ordered  Testing/Procedures: None ordered  Follow-Up: Your physician recommends that you schedule a follow-up appointment in: AS PLANNED WITH DR. Marlou Porch   Any Other Special Instructions Will Be Listed Below (If Applicable).     If you need a refill on your cardiac medications before your next appointment, please call your pharmacy.      Signed, Angelena Form, PA-C  03/15/2017 10:58 AM    [facility] [place], [place], [place]  [address] Phone: [phone]; Fax: [phone]

## 2017-03-13 DIAGNOSIS — G4733 Obstructive sleep apnea (adult) (pediatric): Secondary | ICD-10-CM | POA: Diagnosis not present

## 2017-03-15 ENCOUNTER — Encounter: Payer: Self-pay | Admitting: Physician Assistant

## 2017-03-15 ENCOUNTER — Ambulatory Visit (INDEPENDENT_AMBULATORY_CARE_PROVIDER_SITE_OTHER): Payer: Medicare HMO | Admitting: Physician Assistant

## 2017-03-15 ENCOUNTER — Ambulatory Visit: Payer: Medicare HMO | Admitting: Podiatry

## 2017-03-15 VITALS — BP 152/78 | HR 60 | Ht 63.0 in | Wt 210.4 lb

## 2017-03-15 DIAGNOSIS — E118 Type 2 diabetes mellitus with unspecified complications: Secondary | ICD-10-CM

## 2017-03-15 DIAGNOSIS — G4733 Obstructive sleep apnea (adult) (pediatric): Secondary | ICD-10-CM

## 2017-03-15 DIAGNOSIS — I10 Essential (primary) hypertension: Secondary | ICD-10-CM | POA: Diagnosis not present

## 2017-03-15 DIAGNOSIS — E785 Hyperlipidemia, unspecified: Secondary | ICD-10-CM

## 2017-03-15 DIAGNOSIS — I480 Paroxysmal atrial fibrillation: Secondary | ICD-10-CM | POA: Diagnosis not present

## 2017-03-15 NOTE — Patient Instructions (Addendum)
Medication Instructions:  Your physician recommends that you continue on your current medications as directed. Please refer to the Current Medication list given to you today. You have been given samples of Xarelto to help you out.  These should last 28 days.  Labwork: None ordered  Testing/Procedures: None ordered  Follow-Up: Your physician recommends that you schedule a follow-up appointment in: AS PLANNED WITH DR. Marlou Porch   Any Other Special Instructions Will Be Listed Below (If Applicable).     If you need a refill on your cardiac medications before your next appointment, please call your pharmacy.

## 2017-03-17 DIAGNOSIS — R69 Illness, unspecified: Secondary | ICD-10-CM | POA: Diagnosis not present

## 2017-03-20 ENCOUNTER — Ambulatory Visit (INDEPENDENT_AMBULATORY_CARE_PROVIDER_SITE_OTHER): Payer: Medicare HMO | Admitting: Podiatry

## 2017-03-20 ENCOUNTER — Encounter: Payer: Self-pay | Admitting: Podiatry

## 2017-03-20 DIAGNOSIS — E119 Type 2 diabetes mellitus without complications: Secondary | ICD-10-CM | POA: Diagnosis not present

## 2017-03-20 DIAGNOSIS — D3613 Benign neoplasm of peripheral nerves and autonomic nervous system of lower limb, including hip: Secondary | ICD-10-CM | POA: Diagnosis not present

## 2017-03-20 DIAGNOSIS — M779 Enthesopathy, unspecified: Secondary | ICD-10-CM

## 2017-03-20 NOTE — Progress Notes (Signed)
She presents today for her annual diabetic foot exam. She is complaining of pain to the right foot overlying the third interdigital space.  Objective: Vital signs are stable she is alert and oriented 3 pulses are strong and palpable bilateral capillary fill time is immediate bilateral. Neurologic sensorium is intact her Semmes-Weinstein monofilament and vibratory sensation. Deep tendon reflexes are intact muscle strength is strong symmetrical bilateral. She does have a palpable Mulder's click third interdigital space of the right foot.  Assessment: Neuroma right foot. Diabetes mellitus without complications.  Plan: Injected the third interdigital space today with dexamethasone and local anesthetic. Discussed the need for dehydrated alcohol injections. She has complications with Kenalog. Follow up with her in 1 month and another year for her primary diabetic checkup

## 2017-03-21 ENCOUNTER — Ambulatory Visit: Payer: Medicare HMO | Attending: Gynecologic Oncology | Admitting: Gynecologic Oncology

## 2017-03-21 ENCOUNTER — Encounter: Payer: Self-pay | Admitting: Gynecologic Oncology

## 2017-03-21 VITALS — BP 150/56 | HR 62 | Temp 98.3°F | Resp 18 | Wt 210.0 lb

## 2017-03-21 DIAGNOSIS — C541 Malignant neoplasm of endometrium: Secondary | ICD-10-CM

## 2017-03-21 NOTE — Patient Instructions (Signed)
Follow up with Dr. Radene Knee annually.  Please call for any needs.

## 2017-03-21 NOTE — Progress Notes (Signed)
POSTOPERATIVE FOLLOW-UP  Assessment:    71 y.o. year old with a history of complex atypical hyperplasia.   S/p robotic assisted total hysterectomy, BSO and SLN biopsy on 03/01/17.   Plan: 1) Pathology reports reviewed today 2) Treatment counseling - I discussed the benign nature of CAH. No specific follow-up is necessary for this. She was given the opportunity to ask questions, which were answered to her satisfaction, and she is agreement with the above mentioned plan of care.  3)  Return to clinic on a prn basis  HPI:  Erika Kim is a 71 y.o. year old No obstetric history on file. initially seen in consultation on 02/12/17 referred by Dr Radene Knee for [facility].  She then underwent a robotic hysterectomy, BSO and SLN biopsy on 5/68/12 without complications.  Her postoperative course was uncomplicated.  Her final pathology revealed complex atypical hyperplasia with no invasive malignancy.  She is seen today for a postoperative check and to discuss her pathology results and ongoing plan.  Since discharge from the hospital, she is feeling well.  She has improving appetite, normal bowel and bladder function, and pain controlled with minimal PO medication. She has no other complaints today.    Review of systems: Constitutional:  She has no weight gain or weight loss. She has no fever or chills. Eyes: No blurred vision Ears, Nose, Mouth, Throat: No dizziness, headaches or changes in hearing. No mouth sores. Cardiovascular: No chest pain, palpitations or edema. Respiratory:  No shortness of breath, wheezing or cough Gastrointestinal: She has normal bowel movements without diarrhea or constipation. She denies any nausea or vomiting. She denies blood in her stool or heart burn. Genitourinary:  She denies pelvic pain, pelvic pressure or changes in her urinary function. She has no hematuria, dysuria, or incontinence. She has no irregular vaginal bleeding or vaginal discharge Musculoskeletal: Denies muscle  weakness or joint pains.  Skin:  She has no skin changes, rashes or itching Neurological:  Denies dizziness or headaches. No neuropathy, no numbness or tingling. Psychiatric:  She denies depression or anxiety. Hematologic/Lymphatic:   No easy bruising or bleeding   Physical Exam: Blood pressure (!) 150/56, pulse 62, temperature 98.3 F (36.8 C), temperature source Oral, resp. rate 18, weight 210 lb (95.3 kg). General: Well dressed, well nourished in no apparent distress.   HEENT:  Normocephalic and atraumatic, no lesions.  Extraocular muscles intact. Sclerae anicteric. Pupils equal, round, reactive. No mouth sores or ulcers. Thyroid is normal size, not nodular, midline. Abdomen:  Soft, nontender, nondistended.  No palpable masses.  No hepatosplenomegaly.  No ascites. Normal bowel sounds.  No hernias.  Incisions are well healed Genitourinary: Normal EGBUS  Vaginal cuff intact.  No bleeding or discharge.  No cul de sac fullness. Extremities: No cyanosis, clubbing or edema.  No calf tenderness or erythema. No palpable cords. Psychiatric: Mood and affect are appropriate. Neurological: Awake, alert and oriented x 3. Sensation is intact, no neuropathy.  Musculoskeletal: No pain, normal strength and range of motion.  Donaciano Eva, MD

## 2017-03-27 DIAGNOSIS — L853 Xerosis cutis: Secondary | ICD-10-CM | POA: Diagnosis not present

## 2017-03-27 DIAGNOSIS — L814 Other melanin hyperpigmentation: Secondary | ICD-10-CM | POA: Diagnosis not present

## 2017-03-27 DIAGNOSIS — D225 Melanocytic nevi of trunk: Secondary | ICD-10-CM | POA: Diagnosis not present

## 2017-03-27 DIAGNOSIS — L918 Other hypertrophic disorders of the skin: Secondary | ICD-10-CM | POA: Diagnosis not present

## 2017-04-05 DIAGNOSIS — Z1211 Encounter for screening for malignant neoplasm of colon: Secondary | ICD-10-CM | POA: Diagnosis not present

## 2017-04-24 ENCOUNTER — Ambulatory Visit (INDEPENDENT_AMBULATORY_CARE_PROVIDER_SITE_OTHER): Payer: Medicare HMO | Admitting: Podiatry

## 2017-04-24 ENCOUNTER — Encounter: Payer: Self-pay | Admitting: Podiatry

## 2017-04-24 DIAGNOSIS — D3613 Benign neoplasm of peripheral nerves and autonomic nervous system of lower limb, including hip: Secondary | ICD-10-CM

## 2017-04-24 NOTE — Progress Notes (Signed)
She presents today for follow-up of neuroma to the third interdigital space of the right foot. She states that is doing some better she also relates that she's having some tenderness overlying the sinus tarsi area of the left foot.  Objective: Vital signs are stable alert and oriented 3. Pulses are palpable. Neurologic sensorium is intact. Deep tendon reflexes are intact. She has pain on palpation with a palpable click third interdigital space right foot. Mild stenosis on palpation of the sinus tarsi left and fifth metatarsal left.  Assessment: Metatarsalgia capsulitis left foot. Neuroma third interdigital space right foot improving.  Plan: I injected her first dose of dehydrated alcohol to the third interdigital space today. All her wrists cortisone injection to the subtalar joint she declined. Follow up with her in 1 month

## 2017-05-01 DIAGNOSIS — M79672 Pain in left foot: Secondary | ICD-10-CM | POA: Insufficient documentation

## 2017-05-01 DIAGNOSIS — E1151 Type 2 diabetes mellitus with diabetic peripheral angiopathy without gangrene: Secondary | ICD-10-CM | POA: Diagnosis not present

## 2017-05-01 DIAGNOSIS — Z6838 Body mass index (BMI) 38.0-38.9, adult: Secondary | ICD-10-CM | POA: Diagnosis not present

## 2017-05-01 DIAGNOSIS — I480 Paroxysmal atrial fibrillation: Secondary | ICD-10-CM | POA: Diagnosis not present

## 2017-05-04 NOTE — Anesthesia Postprocedure Evaluation (Signed)
Anesthesia Post Note  Patient: Erika Kim  Procedure(s) Performed: Procedure(s) (LRB): DILATATION & CURETTAGE/HYSTEROSCOPY WITH MYOSURE (N/A)     Anesthesia Post Evaluation  Last Vitals:  Vitals:   01/12/17 0830 01/12/17 0911  BP: (!) 130/54 (!) 144/60  Pulse: 63 (!) 55  Resp: 18 18  Temp:  36.4 C    Last Pain:  Vitals:   01/15/17 1053  TempSrc:   PainSc: 0-No pain                 Lemmie Steinhaus EDWARD

## 2017-05-04 NOTE — Addendum Note (Signed)
Addendum  created 05/04/17 1402 by Lyndle Herrlich, MD   Sign clinical note

## 2017-05-04 NOTE — Addendum Note (Signed)
Addendum  created 05/04/17 1401 by Lyndle Herrlich, MD   Sign clinical note

## 2017-05-04 NOTE — Anesthesia Postprocedure Evaluation (Signed)
Anesthesia Post Note  Patient: Erika Kim  Procedure(s) Performed: Procedure(s) (LRB): DILATATION & CURETTAGE/HYSTEROSCOPY WITH MYOSURE (N/A)     Anesthesia Post Evaluation  Last Vitals:  Vitals:   01/12/17 0830 01/12/17 0911  BP: (!) 130/54 (!) 144/60  Pulse: 63 (!) 55  Resp: 18 18  Temp:  36.4 C    Last Pain:  Vitals:   01/15/17 1053  TempSrc:   PainSc: 0-No pain                 Anabel Lykins EDWARD

## 2017-05-29 DIAGNOSIS — R69 Illness, unspecified: Secondary | ICD-10-CM | POA: Diagnosis not present

## 2017-05-30 DIAGNOSIS — Z1211 Encounter for screening for malignant neoplasm of colon: Secondary | ICD-10-CM | POA: Diagnosis not present

## 2017-05-30 DIAGNOSIS — D126 Benign neoplasm of colon, unspecified: Secondary | ICD-10-CM | POA: Diagnosis not present

## 2017-05-31 ENCOUNTER — Other Ambulatory Visit: Payer: Self-pay | Admitting: Cardiology

## 2017-05-31 ENCOUNTER — Encounter: Payer: Self-pay | Admitting: Podiatry

## 2017-05-31 ENCOUNTER — Ambulatory Visit (INDEPENDENT_AMBULATORY_CARE_PROVIDER_SITE_OTHER): Payer: Medicare HMO | Admitting: Podiatry

## 2017-05-31 DIAGNOSIS — D3613 Benign neoplasm of peripheral nerves and autonomic nervous system of lower limb, including hip: Secondary | ICD-10-CM

## 2017-05-31 DIAGNOSIS — Z1211 Encounter for screening for malignant neoplasm of colon: Secondary | ICD-10-CM | POA: Diagnosis not present

## 2017-05-31 DIAGNOSIS — D126 Benign neoplasm of colon, unspecified: Secondary | ICD-10-CM | POA: Diagnosis not present

## 2017-05-31 NOTE — Telephone Encounter (Signed)
New message   Pt wants an Rx that she can pick up because she found a company in [place] she wants to use. She does not want to use pharmacy anymore for refill.  *STAT* If patient is at the pharmacy, call can be transferred to refill team.   1. Which medications need to be refilled? (please list name of each medication and dose if known) rivaroxaban (XARELTO) 20 MG TABS tablet  2. Which pharmacy/location (including street and city if local pharmacy) is medication to be sent to? [address], [place] [address]  3. Do they need a 30 day or 90 day supply? 30 day

## 2017-05-31 NOTE — Telephone Encounter (Signed)
Pt wishes to have Rx mailed to home when ready. Will print and mail once MD signature obtained.

## 2017-05-31 NOTE — Telephone Encounter (Signed)
Follow up       Calling to make sure nurse call her cell number when presc for xarelto is ready to be picked up.  Pt will send it to a pharmacy in [place].  Please call when ready

## 2017-05-31 NOTE — Telephone Encounter (Signed)
Request received for Xarelto 20mg ; pt is 70 years old, Creatinine-0.68 on 03/02/17, wt-95.3kg, last seen by Bonney Leitz on 03/15/17.  Called pt due to pt requesting paper rx and another call stating to sent to [facility]; need clarification before sending.

## 2017-05-31 NOTE — Progress Notes (Signed)
She presents today for follow-up of neuroma to the thirdof the right foot. States it has no hole outside of my foot hurting.  Objective: Vital signs are stable alert and oriented 3. Pulses are palpable. Neurologic sensorium is intact. Deep tendon reflexes are intact. Palpable Mulder's click third interspace right foot.  Assessment: Neuroma third interspace right.  Plan: Injected her second dose of dehydrated alcohol. Follow up with her in 3-4 weeks.

## 2017-06-06 ENCOUNTER — Other Ambulatory Visit: Payer: Self-pay | Admitting: *Deleted

## 2017-06-06 MED ORDER — RIVAROXABAN 20 MG PO TABS: 20.0000 mg | ORAL_TABLET | Freq: Every day | ORAL | 1 refills | Status: DC

## 2017-06-06 NOTE — Telephone Encounter (Signed)
Age 71 years Saw Angelena Form on 03/15/2017  03/21/2017 Wt 95.3kg 03/02/2017 SrCr 0.68 03/02/2017 Hgb 13.4 HCT 40.4 CrCl 117.47 Refill done for Xarelto 20 mg daily as requested

## 2017-06-06 NOTE — Telephone Encounter (Signed)
Patient called again in regards to rx for xarelto. She stated that it takes about five weeks for her to get it from [place]. I have made her aware that Dr Marlou Porch is not in the office to sign it. She asked if Angelena Form can sign it as she has seen her before.

## 2017-06-13 DIAGNOSIS — G4733 Obstructive sleep apnea (adult) (pediatric): Secondary | ICD-10-CM | POA: Diagnosis not present

## 2017-06-21 ENCOUNTER — Ambulatory Visit (INDEPENDENT_AMBULATORY_CARE_PROVIDER_SITE_OTHER): Payer: Medicare HMO | Admitting: Podiatry

## 2017-06-21 ENCOUNTER — Encounter: Payer: Self-pay | Admitting: Podiatry

## 2017-06-21 DIAGNOSIS — M722 Plantar fascial fibromatosis: Secondary | ICD-10-CM

## 2017-06-21 DIAGNOSIS — D3613 Benign neoplasm of peripheral nerves and autonomic nervous system of lower limb, including hip: Secondary | ICD-10-CM | POA: Diagnosis not present

## 2017-06-21 NOTE — Progress Notes (Signed)
She presents today for follow-up of neuroma to the third interdigital space of the right foot. She states this started to feel significantly improved. She is also starting to feel pain in her right heel. She started to feel pain across the dorsal lateral aspect of the right foot.  Objective: Vital signs are stable she is alert and oriented 3 still retains palpable Mulder's click to the third interdigital space of the right foot. Pulses are still strongly palpable. She has pain on palpation medial continue to go the right heel. No calf pain.  Assessment: Fasciitis right foot. More than likely resulting in lateral compensatory syndrome of the right foot. Neuroma is improving third interdigital space right foot.  Plan: Injected the area today with Kenalog and local anesthetic to the heel. Injected another dose of dehydrated alcohol to the neuroma. Follow-up with her in 3 weeks.

## 2017-07-02 DIAGNOSIS — Z01419 Encounter for gynecological examination (general) (routine) without abnormal findings: Secondary | ICD-10-CM | POA: Diagnosis not present

## 2017-07-02 DIAGNOSIS — Z6837 Body mass index (BMI) 37.0-37.9, adult: Secondary | ICD-10-CM | POA: Diagnosis not present

## 2017-07-07 DIAGNOSIS — Z23 Encounter for immunization: Secondary | ICD-10-CM | POA: Diagnosis not present

## 2017-07-12 ENCOUNTER — Encounter: Payer: Self-pay | Admitting: Podiatry

## 2017-07-12 ENCOUNTER — Ambulatory Visit (INDEPENDENT_AMBULATORY_CARE_PROVIDER_SITE_OTHER): Payer: Medicare HMO | Admitting: Podiatry

## 2017-07-12 DIAGNOSIS — D3613 Benign neoplasm of peripheral nerves and autonomic nervous system of lower limb, including hip: Secondary | ICD-10-CM | POA: Diagnosis not present

## 2017-07-12 NOTE — Progress Notes (Signed)
She presents today for follow-up of neuroma third interdigital space of the right foot states that it is still tender but seems to be better than it was.  Objective: Vital signs are stable she is alert and oriented 3. Pulses are palpable. She has pain on palpation third interdigital space of the right foot.  Assessment: Neuroma third interdigital space right foot improving.  Plan: Discussed etiology pathology conservative versus surgical therapies. Injected another dose of dehydrated alcohol to the third interdigital space of the right foot I will follow-up with her in 3-4 weeks.

## 2017-07-28 DIAGNOSIS — G4733 Obstructive sleep apnea (adult) (pediatric): Secondary | ICD-10-CM | POA: Diagnosis not present

## 2017-08-07 DIAGNOSIS — R69 Illness, unspecified: Secondary | ICD-10-CM | POA: Diagnosis not present

## 2017-08-09 ENCOUNTER — Ambulatory Visit: Payer: Medicare HMO | Admitting: Podiatry

## 2017-08-14 DIAGNOSIS — Z0101 Encounter for examination of eyes and vision with abnormal findings: Secondary | ICD-10-CM | POA: Diagnosis not present

## 2017-08-14 DIAGNOSIS — H52213 Irregular astigmatism, bilateral: Secondary | ICD-10-CM | POA: Diagnosis not present

## 2017-08-14 DIAGNOSIS — H5213 Myopia, bilateral: Secondary | ICD-10-CM | POA: Diagnosis not present

## 2017-08-14 DIAGNOSIS — H524 Presbyopia: Secondary | ICD-10-CM | POA: Diagnosis not present

## 2017-08-16 ENCOUNTER — Ambulatory Visit (INDEPENDENT_AMBULATORY_CARE_PROVIDER_SITE_OTHER): Payer: Medicare HMO | Admitting: Podiatry

## 2017-08-16 ENCOUNTER — Encounter: Payer: Self-pay | Admitting: Podiatry

## 2017-08-16 DIAGNOSIS — D3613 Benign neoplasm of peripheral nerves and autonomic nervous system of lower limb, including hip: Secondary | ICD-10-CM

## 2017-08-17 DIAGNOSIS — E1151 Type 2 diabetes mellitus with diabetic peripheral angiopathy without gangrene: Secondary | ICD-10-CM | POA: Diagnosis not present

## 2017-08-17 DIAGNOSIS — E7849 Other hyperlipidemia: Secondary | ICD-10-CM | POA: Diagnosis not present

## 2017-08-17 DIAGNOSIS — I10 Essential (primary) hypertension: Secondary | ICD-10-CM | POA: Diagnosis not present

## 2017-08-18 NOTE — Progress Notes (Signed)
She presents today states that her neuroma is doing much better.  Objective: Vital signs are stable she is alert and oriented 3. Still has pain on palpation third interdigital space right foot. Much decreased from previous evaluation.  Assessment: Neuroma third interspace right.  Plan: Reinjected today dehydrated alcohol follow-up with her in 3-4 weeks

## 2017-08-20 DIAGNOSIS — Z1231 Encounter for screening mammogram for malignant neoplasm of breast: Secondary | ICD-10-CM | POA: Diagnosis not present

## 2017-08-24 DIAGNOSIS — R82998 Other abnormal findings in urine: Secondary | ICD-10-CM | POA: Diagnosis not present

## 2017-08-24 DIAGNOSIS — G4733 Obstructive sleep apnea (adult) (pediatric): Secondary | ICD-10-CM | POA: Diagnosis not present

## 2017-08-24 DIAGNOSIS — E7849 Other hyperlipidemia: Secondary | ICD-10-CM | POA: Diagnosis not present

## 2017-08-24 DIAGNOSIS — I7389 Other specified peripheral vascular diseases: Secondary | ICD-10-CM | POA: Diagnosis not present

## 2017-08-24 DIAGNOSIS — Z0000 Encounter for general adult medical examination without abnormal findings: Secondary | ICD-10-CM | POA: Diagnosis not present

## 2017-08-24 DIAGNOSIS — Z6838 Body mass index (BMI) 38.0-38.9, adult: Secondary | ICD-10-CM | POA: Diagnosis not present

## 2017-08-24 DIAGNOSIS — I10 Essential (primary) hypertension: Secondary | ICD-10-CM | POA: Diagnosis not present

## 2017-08-24 DIAGNOSIS — I480 Paroxysmal atrial fibrillation: Secondary | ICD-10-CM | POA: Diagnosis not present

## 2017-08-24 DIAGNOSIS — E1151 Type 2 diabetes mellitus with diabetic peripheral angiopathy without gangrene: Secondary | ICD-10-CM | POA: Diagnosis not present

## 2017-08-24 DIAGNOSIS — D333 Benign neoplasm of cranial nerves: Secondary | ICD-10-CM | POA: Diagnosis not present

## 2017-08-26 ENCOUNTER — Encounter: Payer: Self-pay | Admitting: Neurology

## 2017-08-27 ENCOUNTER — Ambulatory Visit: Payer: Medicare HMO | Admitting: Neurology

## 2017-08-27 ENCOUNTER — Encounter: Payer: Self-pay | Admitting: Neurology

## 2017-08-27 VITALS — BP 131/63 | HR 64 | Ht 63.0 in | Wt 213.5 lb

## 2017-08-27 DIAGNOSIS — Z9989 Dependence on other enabling machines and devices: Secondary | ICD-10-CM

## 2017-08-27 DIAGNOSIS — G4733 Obstructive sleep apnea (adult) (pediatric): Secondary | ICD-10-CM

## 2017-08-27 NOTE — Progress Notes (Signed)
Subjective:    Patient ID: Erika Kim is a 71 y.o. female.  HPI     Interim history:   Erika Kim is a very pleasant 71 year old right-handed woman with an underlying medical history of hyperlipidemia, acoustic neuroma, hypertension, DM, recurrent headaches, positional vertigo, left shoulder pain, and sinus congestion, status post sinus surgery, left knee surgery, tubal ligation, right rotator cuff surgery, right total knee replacement surgery, right great toe surgery, and recent hysterectomy and August 2018, who presents for follow-up consultation of her obstructive sleep apnea, on CPAP therapy, and I had also seen her for recurrent vertiginous symptoms before. I last saw her on 08/22/2016, at which time she reported doing well. She was fully compliant with CPAP. She had no recent episode of headache or vertigo. She had a bout of A. fib some 6 months prior. She went to the ER and was diagnosed with paroxysmal A. fib, started on Xarelto and had a follow-up with cardiology.   Today, 08/27/2017: I reviewed her CPAP compliance data from 07/28/2017 through 08/26/2017 which is a total of 30 days, during which time she used her machine every night with percent used days greater than 4 hours at 100%, indicating superb compliance with an average usage of 7 hours and 39 minutes, residual AHI at goal at 1.2 per hour, leak on the low side with the 95th percentile at 3.1 L/m on a pressure of 8 cm with EPR of 2. She reports not sleeping as well as she used to, tends to stress over things at night. Had hysterectomy on 03/01/17 under Dr. Denman George. She had a colonoscopy in August. Had some polyps taken out.   The patient's allergies, current medications, family history, past medical history, past social history, past surgical history and problem list were reviewed and updated as appropriate.    Previously:  I saw her on 09/02/2015 for vertigo. She had seen Dr. Ernesto Rutherford on 07/20/2015 and he voiced concern that she  may have vertiginous migraines. She reported 3 episodes of vertigo in 2016. She had some headaches which are infrequent, some associated with nausea but no significant photophobia and she did report that it would help to lie down and rest and she will take over-the-counter Advil. She felt that her headaches actually improved after she started CPAP therapy and she was fully compliant with CPAP. Her exam is nonfocal at the time and she followed regularly with ENT. I suggested she continue with as needed Aleve for headaches and talked to her ENT about potentially pursuing vestibular rehabilitation for vertigo. She was fully compliant with CPAP therapy. I suggested a one-year checkup.   I reviewed her CPAP compliance data from 07/22/2016 through 08/20/2016 which is a total of 30 days, during which time she used her machine every night with percent used days greater than 4 hours at 100%, indicating superb compliance with an average usage of 7 hours and 36 minutes, residual AHI 1.3 per hour, leaked low with the 95th percentile at 6.1 L/m on a pressure of 8 cm with EPR of 2.    I saw her on 10/08/2014, at which time she was doing well on CPAP therapy with full compliance and great results. She had a recent brain MRI for her acoustic neuroma at the time and her MRI was felt to be stable per her report.   I reviewed her CPAP compliance data from 07/17/2015 through 08/15/2015 which is a total of 30 days during which time she used her machine every day  with percent used days greater than 100%, indicating superb compliance with an average usage of 7 hours and 27 minutes, residual AHI low at 1.3 per hour, leak low with the 95th percentile at 4 L/m on a pressure of 8 cm with EPR of 2.   I saw her on 04/08/2014, at which time she reported adjusting well to CPAP and endorse sleeping deeply and without is many interruptions. She felt memory was stable. She had started her diabetes medication.   I reviewed her compliance  data from 09/05/2014 to 10/04/2014 which is a total of 30 days during which time she used her machine every night with percent used days greater than 4 hours of 100%, indicating superb compliance. Average usage of 7 hours and 9 minutes, residual AHI low at 1.8 per hour and leak low at 6 L/m for the 95th percentile with the pressure currently at 8 cm with EPR of 2.   I saw her on 10/09/2013, at which time we discussed her recent baseline sleep study which confirmed mild to moderate sleep apnea. I felt that because of her underlying sleep related complaints and her medical history she would benefit from treatment with CPAP and suggested a repeat sleep study with CPAP titration. She had the study on 01/26/2014 and I went over her test results with her in detail today. Sleep efficiency was markedly reduced at 47.1% with a long latency to sleep of 48.5 minutes and wake after sleep onset of 165 minutes with moderate sleep fragmentation noted. She had an increased percentage of slow-wave sleep and a normal percentage of REM sleep with a prolonged REM latency. She had no significant periodic leg movements of sleep and no significant EKG changes. She had a baseline oxygen saturation of 93% with a nadir of 84%. Snoring was eliminated with CPAP which was started at 5 cm and titrated to 8 cm with elimination of her sleep disordered breathing and her snoring. REM sleep was achieved supine REM sleep was not achieved. Based on her test results I prescribed CPAP for her.   I reviewed the patient's CPAP compliance data from 03/08/2014 to 04/06/2014, which is a total of 30 days, during which time the patient used CPAP every day. The average usage for all days was 7 hours and 21 minutes. The percent used days greater than 4 hours was 100 %, indicating superb compliance. The residual AHI was low at 1.5 per hour, indicating an appropriate treatment pressure of 8 cwp with EPR of 2. Air leak from the mask was low with the 95th  percentile at 5.4 L per minute.   I first met her on 08/08/2013 at which time she complained of chronic poor quality sleep, snoring, and recent onset of memory problems, as well as witnessed apneas.    She had a baseline sleep study on 09/04/2013. Her sleep efficiency was reduced at 66.1% with a prolonged latency to sleep of 34.5 minutes. Wake after sleep onset was 91 minutes with moderate sleep fragmentation noted. She had an increased percentage of stage I and stage II sleep, and near normal percentage of deep sleep and a decreased percentage of REM sleep at 13.3% with a high normal REM latency of 117 minutes. She had no significant periodic leg movements of sleep. She had moderate to at times loud snoring. She had a total AHI of 6.4 per hour with further elevation to 20.9 per hour in the supine position. Her baseline oxygen saturation was 93%, her nadir was 88%. She  spent 10 minutes and 14 seconds below the saturation of 90% for the night.   We called her with the test results in the interim and asked her to consider returning for CPAP titration study and she reported that she would like to discuss this with her husband first, which is why I saw her back in follow up.   Her Past Medical History Is Significant For: Past Medical History:  Diagnosis Date  . Acoustic neuroma ([place]) followed by dr brown at [facility]   right side w/ chronic roaring noise  . Anticoagulant long-term use    xarelto  . Benign paroxysmal positional vertigo   . Endometrial polyp   . Frequency of urination   . Hyperlipidemia   . Hypertension   . OA (osteoarthritis)    left knee  . OSA on CPAP   . PAF (paroxysmal atrial fibrillation) [facility]) cardiologist-  dr Marlou Porch   dx 04/ 2017  . PMB (postmenopausal bleeding)   . TMJ (temporomandibular joint disorder)    left side-- wears guard  . Type 2 diabetes mellitus ([place])   . Wears contact lenses     Her Past Surgical History Is Significant For: Past Surgical History:   Procedure Laterality Date  . CARPAL TUNNEL RELEASE Right 04/17/2007   w/ Excision ganglion cyst and Pulley Release right thumb  . HAMMER TOE SURGERY Left 2010   left second toe  . KNEE ARTHROSCOPY Right 2010  . KNEE SURGERY Left 1993  . NASAL SINUS SURGERY  1985 and 1995  . ROTATOR CUFF REPAIR Right 04/2004  . TOE SURGERY Right 2012   Great toe and second toe  . TOTAL ELBOW REPLACEMENT Right 2000  . TOTAL KNEE ARTHROPLASTY Right 01/12/2010  . TRANSTHORACIC ECHOCARDIOGRAM  03/28/2016   ef 60-65%/ mild AR and MR  . TUBAL LIGATION Bilateral 1978    Her Family History Is Significant For: Family History  Problem Relation Age of Onset  . Heart failure Father   . Heart failure Mother     Her Social History Is Significant For: Social History   Socioeconomic History  . Marital status: Married    Spouse name: None  . Number of children: None  . Years of education: None  . Highest education level: None  Social Needs  . Financial resource strain: None  . Food insecurity - worry: None  . Food insecurity - inability: None  . Transportation needs - medical: None  . Transportation needs - non-medical: None  Occupational History  . None  Tobacco Use  . Smoking status: Never Smoker  . Smokeless tobacco: Never Used  Substance and Sexual Activity  . Alcohol use: No  . Drug use: No  . Sexual activity: None  Other Topics Concern  . None  Social History Narrative   Married w/2 children (boy and girl).  Retired.    Her Allergies Are:  Allergies  Allergen Reactions  . Cephalosporins Itching    severe  . Augmentin [Amoxicillin-Pot Clavulanate] Itching    severe  :   Her Current Medications Are:  Outpatient Encounter Medications as of 08/27/2017  Medication Sig  . acetaminophen (TYLENOL) 500 MG tablet Take 500-1,000 mg by mouth daily as needed for mild pain, moderate pain, fever or headache.   Marland Kitchen atorvastatin (LIPITOR) 40 MG tablet Take 1 tablet by mouth every morning.   .  calcium carbonate (OS-CAL) 600 MG TABS tablet Take 600 mg by mouth 2 (two) times daily.   . Cholecalciferol (VITAMIN D3) 5000  units TABS Take 1 tablet by mouth daily.  Marland Kitchen ipratropium (ATROVENT) 0.03 % nasal spray Place 1-2 sprays into the nose at bedtime as needed for rhinitis.   Marland Kitchen JANUVIA 100 MG tablet Take 1 tablet by mouth daily after supper.  Marland Kitchen lisinopril-hydrochlorothiazide (PRINZIDE,ZESTORETIC) 20-12.5 MG per tablet Take 1 tablet by mouth every morning.   . metoprolol succinate (TOPROL-XL) 50 MG 24 hr tablet Take 1 tablet by mouth See admin instructions. Takes twice daily, may take a third time if heart goes into a-fib  . Multiple Vitamin (MULTIVITAMIN) tablet Take 1 tablet by mouth daily.  Vladimir Faster Glycol-Propyl Glycol (SYSTANE OP) Place 1 drop into both eyes daily as needed (dry eyes).  . rivaroxaban (XARELTO) 20 MG TABS tablet Take 1 tablet (20 mg total) by mouth daily with supper.  . saline (AYR) GEL Place 1 application into the nose at bedtime.   No facility-administered encounter medications on file as of 08/27/2017.   :  Review of Systems:  Out of a complete 14 point review of systems, all are reviewed and negative with the exception of these symptoms as listed below: Review of Systems  Neurological:       Patient states that she has been doing fine with her CPAP. She reports that she doesn't sleep as well as she used to, but not because of the machine.     Objective:  Neurological Exam  Physical Exam Physical Examination:   Vitals:   08/27/17 1359  BP: 131/63  Pulse: 64   General Examination: The patient is a very pleasant 71 y.o. female in no acute distress. She appears well-developed and well-nourished and well groomed.   HEENT: Normocephalic, atraumatic, pupils are equal, round and reactive to light and accommodation. Extraocular tracking is good without limitation to gaze excursion or nystagmus noted. Normal smooth pursuit is noted. Hearing is grossly intact. Face  is symmetric with normal facial animation and normal facial sensation. Speech is clear with no dysarthria noted. There is no hypophonia. Her speech is slightly nasal sounding. There is no pharyngeal erythema. There is no lip, neck/head, jaw or voice tremor. Neck is supple with full range of passive and active motion. There are no carotid bruits on auscultation. Oropharynx exam reveals: mild mouth dryness, adequate dental hygiene and mild airway crowding. Mallampati is class II. Tongue protrudes centrally and palate elevates symmetrically. Tonsils are absent.   Chest: Clear to auscultation without wheezing, rhonchi or crackles noted.  Heart: S1+S2+0, regular and normal without murmurs, rubs or gallops noted.   Abdomen: Soft, non-tender and non-distended with normal bowel sounds appreciated on auscultation. Abdomen in obese.  Extremities: There is no pitting edema in the distal lower extremities bilaterally. Pedal pulses are intact.  Skin: Warm and dry without trophic changes noted. There are no varicose veins.  Musculoskeletal: exam reveals no obvious joint deformities, tenderness or joint swelling or erythema.   Neurologically:  Mental status: The patient is awake, alert and oriented in all 4 spheres. Her memory, attention, language and knowledge are appropriate. There is no aphasia, agnosia, apraxia or anomia. Speech is clear with normal prosody and enunciation. Thought process is linear. Mood is congruent and affect is normal.  Cranial nerves are as described above under HEENT exam. In addition, shoulder shrug is normal with equal shoulder height noted. Motor exam: Normal bulk, strength and tone is noted. There is no drift, tremor or rebound. Romberg is negative. Reflexes are 1+ in UEs, and trace in the LEs. Fine motor skills  are intact with normal finger taps, normal hand movements, normal rapid alternating patting, normal foot taps and normal foot agility.  Cerebellar testing shows no  dysmetria or intention tremor on finger to nose testing. There is no truncal or gait ataxia.  Sensory exam is intact to light touch in the upper and lower extremities.  Gait, station and balance: She stands without problems, denies any lightheadedness or vertiginous symptoms. Posture is age-appropriate and gait is unremarkable.   Assessment and Plan:   In summary, Erika Kim is a very pleasant 71 year old female with an underlying medical history of hyperlipidemia, PAF (on Xarelto), acoustic neuroma (followed by Dr. Owens Shark at [facility]), hypertension, DM, recurrent headaches, positional vertigo, left shoulder pain, and sinus congestion, status multiple surgeries including sinus surgery, left knee surgery, tubal ligation, right rotator cuff surgery, right total knee replacement surgery, right great toe surgery,  and obesity, who presents for follow-up consultation of her obstructive sleep apnea, on CPAP therapy with full compliance and ongoing good results. She has been tolerating the nasal pillows, and the current settings are adequate. She is encouraged to continue with treatment, thankfully has not had any recent vertigo or sinister headaches. She has in the interim had a hysterectomy. Physical exam and neurological exam are stable and nonfocal. I updated her prescription for CPAP related supplies. I suggested follow up in one year. She can see one of our NPs next time. I answered all her questions today and the patient was in agreement with the above. I spent 20 minutes in total face-to-face time with the patient, more than 50% of which was spent in counseling and coordination of care, reviewing test results, reviewing medication and discussing or reviewing the diagnosis of OSA, its prognosis and treatment options. Pertinent laboratory and imaging test results that were available during this visit with the patient were reviewed by me and considered in my medical decision making (see chart for  details).

## 2017-08-27 NOTE — Patient Instructions (Addendum)
Keep up the good work! We will see you back in one year.  Please continue using your CPAP regularly. While your insurance requires that you use CPAP at least 4 hours each night on 70% of the nights, I recommend, that you not skip any nights and use it throughout the night if you can. Getting used to CPAP and staying with the treatment long term does take time and patience and discipline. Untreated obstructive sleep apnea when it is moderate to severe can have an adverse impact on cardiovascular health and raise her risk for heart disease, arrhythmias, hypertension, congestive heart failure, stroke and diabetes. Untreated obstructive sleep apnea causes sleep disruption, nonrestorative sleep, and sleep deprivation. This can have an impact on your day to day functioning and cause daytime sleepiness and impairment of cognitive function, memory loss, mood disturbance, and problems focussing. Using CPAP regularly can improve these symptoms.

## 2017-08-31 DIAGNOSIS — R69 Illness, unspecified: Secondary | ICD-10-CM | POA: Diagnosis not present

## 2017-09-06 ENCOUNTER — Encounter: Payer: Self-pay | Admitting: Podiatry

## 2017-09-06 ENCOUNTER — Ambulatory Visit: Payer: Medicare HMO | Admitting: Podiatry

## 2017-09-06 DIAGNOSIS — D3613 Benign neoplasm of peripheral nerves and autonomic nervous system of lower limb, including hip: Secondary | ICD-10-CM

## 2017-09-06 NOTE — Progress Notes (Signed)
She presents today for follow-up of her neuroma third interdigital space of the right foot. She states that it feels the same to her but she is not sure.  Objective: Much decrease in pain on palpation to the neuroma today. Palpable Mulder's click is still present but minimally painful.  Assessment: Resolving neuroma third interdigital space right.  Plan: Reinjected the area today with dehydrated alcohol will follow up with her in March when she returns from [place].

## 2017-09-11 DIAGNOSIS — J040 Acute laryngitis: Secondary | ICD-10-CM | POA: Diagnosis not present

## 2017-09-11 DIAGNOSIS — H9041 Sensorineural hearing loss, unilateral, right ear, with unrestricted hearing on the contralateral side: Secondary | ICD-10-CM | POA: Diagnosis not present

## 2017-09-11 DIAGNOSIS — J320 Chronic maxillary sinusitis: Secondary | ICD-10-CM | POA: Diagnosis not present

## 2017-09-11 DIAGNOSIS — J322 Chronic ethmoidal sinusitis: Secondary | ICD-10-CM | POA: Diagnosis not present

## 2017-09-18 DIAGNOSIS — J370 Chronic laryngitis: Secondary | ICD-10-CM | POA: Diagnosis not present

## 2017-09-18 DIAGNOSIS — G4733 Obstructive sleep apnea (adult) (pediatric): Secondary | ICD-10-CM | POA: Diagnosis not present

## 2017-09-18 DIAGNOSIS — J322 Chronic ethmoidal sinusitis: Secondary | ICD-10-CM | POA: Diagnosis not present

## 2017-09-18 DIAGNOSIS — J320 Chronic maxillary sinusitis: Secondary | ICD-10-CM | POA: Diagnosis not present

## 2017-09-18 DIAGNOSIS — H9041 Sensorineural hearing loss, unilateral, right ear, with unrestricted hearing on the contralateral side: Secondary | ICD-10-CM | POA: Diagnosis not present

## 2017-09-21 ENCOUNTER — Ambulatory Visit: Payer: Medicare HMO | Admitting: Cardiology

## 2017-09-21 ENCOUNTER — Encounter: Payer: Self-pay | Admitting: Cardiology

## 2017-09-21 VITALS — BP 134/70 | HR 61 | Ht 62.0 in | Wt 212.8 lb

## 2017-09-21 DIAGNOSIS — E785 Hyperlipidemia, unspecified: Secondary | ICD-10-CM | POA: Diagnosis not present

## 2017-09-21 DIAGNOSIS — I480 Paroxysmal atrial fibrillation: Secondary | ICD-10-CM

## 2017-09-21 DIAGNOSIS — G4733 Obstructive sleep apnea (adult) (pediatric): Secondary | ICD-10-CM | POA: Diagnosis not present

## 2017-09-21 DIAGNOSIS — I10 Essential (primary) hypertension: Secondary | ICD-10-CM | POA: Diagnosis not present

## 2017-09-21 DIAGNOSIS — E118 Type 2 diabetes mellitus with unspecified complications: Secondary | ICD-10-CM | POA: Diagnosis not present

## 2017-09-21 NOTE — Patient Instructions (Signed)

## 2017-09-21 NOTE — Progress Notes (Signed)
Cardiology Office Note    Date:  09/21/2017   ID:  Laneisha, Mino 09/03/1946, MRN [id number]  PCP:  [name], MD  Cardiologist:   [name], MD     History of Present Illness:  Erika Kim is a 71 y.o. female here for follow up of atrial fibrillation having previously been seen in the emergency department on 02/18/16. She returned from dinner, went upstairs and her heart began to skip. She felt like her heart was going to jump out of her chest. Lasted one-two hours. Xarelto was started for atrial fibrillation, risk score of 4. Atrial fibrillation was noted on 02/17/16, normal rate. The next day 02/18/16 at 2 AM, she was in sinus rhythm.  Since that emergency room visit, she may have had a few more episodes of atrial fibrillation, usually they last approximately one hour. No prior strokes.  Positive for diabetes, hypertension, female, age greater than 82.  No bleeding issues. Tolerating Xarelto well. Has been on Toprol.  Both grandparents had CVA's. Father with electrical issues with heart. Mother had CP. CHF.   05/22/16-Felt a skip last week. Rare. Tolerating Xarelto well, no bleeding. Worried about do-nut hole. No chest pain, no shortness of breath. She is excited, just bought a new camper. They're going down to [place] for the winter.  09/21/16 - blood on tongue one night, Xarelto. This subsided. She is having no significant palpitations. When she exercises sometimes her heart rate goes up to 140, she slows down and her heart rate slows down as well. They're going to [place] in their camper. No chest pain. No syncopal.  09/21/17-overall is been doing quite well.  She had been diagnosed with endometrial cancer stage 0 and underwent robotic total hysterectomy on 03/02/17 by Dr. Denman George.  She is back on her Xarelto.  Minor bleeding post surgery.  Has had rare palpitations.  No syncope chest pain orthopnea.  Minor episode yesterday of atrial fibrillation she believes.  She  knows to take an extra Toprol if necessary.  Doing well otherwise.  No syncope bleeding orthopnea PND.  She is continuing with her Xarelto.  Past Medical History:  Diagnosis Date  . Acoustic neuroma ([place]) followed by dr brown at [facility]   right side w/ chronic roaring noise  . Anticoagulant long-term use    xarelto  . Benign paroxysmal positional vertigo   . Endometrial polyp   . Frequency of urination   . Hyperlipidemia   . Hypertension   . OA (osteoarthritis)    left knee  . OSA on CPAP   . PAF (paroxysmal atrial fibrillation) [facility]) cardiologist-  dr Marlou Porch   dx 04/ 2017  . PMB (postmenopausal bleeding)   . TMJ (temporomandibular joint disorder)    left side-- wears guard  . Type 2 diabetes mellitus ([place])   . Wears contact lenses     Past Surgical History:  Procedure Laterality Date  . CARPAL TUNNEL RELEASE Right 04/17/2007   w/ Excision ganglion cyst and Pulley Release right thumb  . DILATATION & CURETTAGE/HYSTEROSCOPY WITH MYOSURE N/A 01/12/2017   Procedure: DILATATION & CURETTAGE/HYSTEROSCOPY WITH MYOSURE;  Surgeon: Arvella Nigh, MD;  Location: [facility];  Service: Gynecology;  Laterality: N/A;  . HAMMER TOE SURGERY Left 2010   left second toe  . KNEE ARTHROSCOPY Right 2010  . KNEE SURGERY Left 1993  . LYMPH NODE BIOPSY N/A 03/01/2017   Procedure: SENTINEL LYMPH NODE BIOPSY;  Surgeon: Everitt Amber, MD;  Location: [facility];  Service: Gynecology;  Laterality: N/A;  . NASAL SINUS SURGERY  1985 and 1995  . ROBOTIC ASSISTED TOTAL HYSTERECTOMY WITH BILATERAL SALPINGO OOPHERECTOMY Bilateral 03/01/2017   Procedure: XI ROBOTIC ASSISTED TOTAL HYSTERECTOMY WITH BILATERAL SALPINGO OOPHORECTOMY;  Surgeon: Everitt Amber, MD;  Location: [facility];  Service: Gynecology;  Laterality: Bilateral;  . ROTATOR CUFF REPAIR Right 04/2004  . TOE SURGERY Right 2012   Great toe and second toe  . TOTAL ELBOW REPLACEMENT Right 2000  . TOTAL KNEE ARTHROPLASTY Right 01/12/2010  .  TRANSTHORACIC ECHOCARDIOGRAM  03/28/2016   ef 60-65%/ mild AR and MR  . TUBAL LIGATION Bilateral 1978    Current Medications: Outpatient Medications Prior to Visit  Medication Sig Dispense Refill  . acetaminophen (TYLENOL) 500 MG tablet Take 500-1,000 mg by mouth daily as needed for mild pain, moderate pain, fever or headache.     Marland Kitchen atorvastatin (LIPITOR) 40 MG tablet Take 1 tablet by mouth every morning.     Marland Kitchen azithromycin (ZITHROMAX) 250 MG tablet Take 1 tablet by mouth daily.    . calcium carbonate (OS-CAL) 600 MG TABS tablet Take 600 mg by mouth 2 (two) times daily.     . Cholecalciferol (VITAMIN D3) 5000 units TABS Take 1 tablet by mouth daily.    . GuaiFENesin (MUCINEX PO) Take 400 mg by mouth daily.    Marland Kitchen ipratropium (ATROVENT) 0.03 % nasal spray Place 1-2 sprays into the nose at bedtime as needed for rhinitis.     Marland Kitchen JANUVIA 100 MG tablet Take 1 tablet by mouth daily after supper.    Marland Kitchen lisinopril-hydrochlorothiazide (PRINZIDE,ZESTORETIC) 20-12.5 MG per tablet Take 1 tablet by mouth every morning.     . metoprolol succinate (TOPROL-XL) 50 MG 24 hr tablet Take 1 tablet by mouth See admin instructions. Takes twice daily, may take a third time if heart goes into a-fib    . Multiple Vitamin (MULTIVITAMIN) tablet Take 1 tablet by mouth daily.    Vladimir Faster Glycol-Propyl Glycol (SYSTANE OP) Place 1 drop into both eyes daily as needed (dry eyes).    . rivaroxaban (XARELTO) 20 MG TABS tablet Take 1 tablet (20 mg total) by mouth daily with supper. 90 tablet 1  . saline (AYR) GEL Place 1 application into the nose at bedtime.     No facility-administered medications prior to visit.      Allergies:   Cephalosporins and Augmentin [amoxicillin-pot clavulanate]   Social History   Socioeconomic History  . Marital status: Married    Spouse name: None  . Number of children: None  . Years of education: None  . Highest education level: None  Social Needs  . Financial resource strain: None  .  Food insecurity - worry: None  . Food insecurity - inability: None  . Transportation needs - medical: None  . Transportation needs - non-medical: None  Occupational History  . None  Tobacco Use  . Smoking status: Never Smoker  . Smokeless tobacco: Never Used  Substance and Sexual Activity  . Alcohol use: No  . Drug use: No  . Sexual activity: None  Other Topics Concern  . None  Social History Narrative   Married w/2 children (boy and girl).  Retired.     Family History:  The patient's family history includes Heart failure in her father and mother.   ROS:   Please see the history of present illness.  ROS all other review of systems negative   PHYSICAL EXAM:   VS:  BP 134/70  Pulse 61   Ht 5\' 2"  (1.575 m)   Wt 212 lb 12.8 oz (96.5 kg)   SpO2 97%   BMI 38.92 kg/m    GEN: Well nourished, well developed, in no acute distress  HEENT: normal  Neck: no JVD, carotid bruits, or masses Cardiac: RRR; 2/6 systolic murmur RUSB, no rubs, or gallops,no edema  Respiratory:  clear to auscultation bilaterally, normal work of breathing GI: soft, nontender, nondistended, + BS, obese MS: no deformity or atrophy  Skin: warm and dry, no rash Neuro:  Alert and Oriented x 3, Strength and sensation are intact Psych: euthymic mood, full affect  Wt Readings from Last 3 Encounters:  09/21/17 212 lb 12.8 oz (96.5 kg)  08/27/17 213 lb 8 oz (96.8 kg)  03/21/17 210 lb (95.3 kg)      Studies/Labs Reviewed:   EKG:  None today  Recent Labs: 02/19/2017: ALT 75 03/02/2017: BUN 11; Creatinine, Ser 0.68; Hemoglobin 13.4; Platelets 155; Potassium 4.1; Sodium 136   Lipid Panel No results found for: CHOL, TRIG, HDL, CHOLHDL, VLDL, LDLCALC, LDLDIRECT  Additional studies/ records that were reviewed today include:  Prior office note from Dr. Sharlett Iles, lab work reviewed.  ECHO 03/28/16: - Left ventricle: The cavity size was normal. Systolic function was   normal. The estimated ejection fraction was  in the range of 60%   to 65%. Wall motion was normal; there were no regional wall   motion abnormalities. The transmitral flow pattern was normal.   Left ventricular diastolic function parameters were normal. - Aortic valve: Moderate focal calcification, consistent with   sclerosis. There was mild regurgitation. - Mitral valve: There was mild regurgitation.    ASSESSMENT:    1. Paroxysmal atrial fibrillation (HCC)   2. Essential hypertension   3. Obstructive sleep apnea   4. Hyperlipidemia, unspecified hyperlipidemia type   5. Type 2 diabetes mellitus with complication, unspecified whether long term insulin use (HCC)      PLAN:  In order of problems listed above:  Paroxysmal atrial fibrillation  - Xarelto - CHADS-VASc -4   - Brief episodes, 1-2 hours.   - Take extra Toprol 50 mg if she is having rapid heartbeat. She wears a fitbit and sometimes this helps her. Doing well.   - Next step would be antiarrhythmic therapy. She is no history of coronary artery disease. Perhaps a class IA agent would be helpful. Overall she is doing well currently.  Rare episodes of A. fib.  She has a fit bit that monitors her heart rate.  She knows to take an extra Toprol if necessary.  - Discussed weight loss. She wears CPAP for obstructive sleep apnea. Exercise. Decaffeinated coffee. No decongestants.   OSA  - CPAP treatment  Obesity  - Weight loss low carbohydrates, discussed once again, she is exercising at the gym 4 days a week.  Elliptical.  Essential hypertension  - Multidrug regimen  - She has actually been on Toprol for some time.  Doing well.  Well controlled.  Hyperlipidemia  - Continue atorvastatin, no side effects.  LDL 84, HDL 40.  Diabetes  - Januvia, Dr. Sharlett Iles monitoring closely.  Hemoglobin A1c 5.9.  Excellent.  Endometrial cancer -Status post hysterectomy Dr. Denman George.  She is doing well.  She states that she had no pain post surgery.  Going to [place] again.   They have 1/5 wheeler.  Her husband enjoys remote [employer].  We will see her back in 1 year since she is  doing quite well.    Medication Adjustments/Labs and Tests Ordered: Current medicines are reviewed at length with the patient today.  Concerns regarding medicines are outlined above.  Medication changes, Labs and Tests ordered today are listed in the Patient Instructions below. Patient Instructions  Medication Instructions:  The current medical regimen is effective;  continue present plan and medications.  Follow-Up: Follow up in 1 year with Dr. Marlou Porch.  You will receive a letter in the mail 2 months before you are due.  Please call us when you receive this letter to schedule your follow up appointment.  If you need a refill on your cardiac medications before your next appointment, please call your pharmacy.  Thank you for choosing [facility]!!        Signed, [name], MD  09/21/2017 9:23 AM    [address], [place], [place]  [address] Phone: [phone]; Fax: [phone]

## 2017-12-19 DIAGNOSIS — H524 Presbyopia: Secondary | ICD-10-CM | POA: Diagnosis not present

## 2017-12-23 DIAGNOSIS — G4733 Obstructive sleep apnea (adult) (pediatric): Secondary | ICD-10-CM | POA: Diagnosis not present

## 2018-01-01 DIAGNOSIS — R69 Illness, unspecified: Secondary | ICD-10-CM | POA: Diagnosis not present

## 2018-01-02 DIAGNOSIS — M170 Bilateral primary osteoarthritis of knee: Secondary | ICD-10-CM | POA: Diagnosis not present

## 2018-01-02 DIAGNOSIS — M545 Low back pain: Secondary | ICD-10-CM | POA: Diagnosis not present

## 2018-01-02 DIAGNOSIS — H524 Presbyopia: Secondary | ICD-10-CM | POA: Diagnosis not present

## 2018-01-03 ENCOUNTER — Encounter: Payer: Self-pay | Admitting: Podiatry

## 2018-01-03 ENCOUNTER — Ambulatory Visit (INDEPENDENT_AMBULATORY_CARE_PROVIDER_SITE_OTHER): Payer: Medicare HMO | Admitting: Podiatry

## 2018-01-03 DIAGNOSIS — D3613 Benign neoplasm of peripheral nerves and autonomic nervous system of lower limb, including hip: Secondary | ICD-10-CM

## 2018-01-05 NOTE — Progress Notes (Signed)
Presents today for follow-up of his neuroma third interdigital space of the right foot states that is feeling good 1 day and the next day as bad but she has been in [place] and has not been able to have an injection.  Objective: Vital signs are stable alert and oriented x3.  Pulses are palpable.  She has pain on palpation third interdigital space of the right foot.  Assessment: Neuroma third interdigital space of the right foot.  Plan: Discussed etiology pathology conservative surgical therapies.  After sterile Betadine skin prep injected 4% dehydrated alcohol a total of 2 cc with local anesthetic to the third interdigital space of the right foot for chemical neural lysis.  Follow-up with her in 3-4 weeks.

## 2018-01-24 ENCOUNTER — Encounter: Payer: Self-pay | Admitting: Podiatry

## 2018-01-24 ENCOUNTER — Ambulatory Visit: Payer: Medicare HMO | Admitting: Podiatry

## 2018-01-24 DIAGNOSIS — D3613 Benign neoplasm of peripheral nerves and autonomic nervous system of lower limb, including hip: Secondary | ICD-10-CM

## 2018-01-24 DIAGNOSIS — D689 Coagulation defect, unspecified: Secondary | ICD-10-CM

## 2018-01-24 NOTE — Progress Notes (Signed)
She presents today for follow-up of her neuroma third interdigital space of the right foot states that it hurts a lot since last shot.  Objective: Vital signs are stable she is alert and oriented x3 still has a palpable Mulder's click third interdigital space of the right foot vascular status is intact.  Assessment: Neuroma third interdigital space right.  Plan: After sterile Betadine skin prep I injected 2 cc of 4% dehydrated alcohol to the third interdigital space she tolerated procedure well will follow up with her in 3 weeks.

## 2018-01-26 ENCOUNTER — Other Ambulatory Visit: Payer: Self-pay | Admitting: Cardiology

## 2018-01-28 NOTE — Telephone Encounter (Signed)
Pt is a 72 yr old female. Pt saw Dr Gillian Shields 09/21/17 and Wt was 96.5Kg. SCr from PCP was 0.7 on 08/17/17. CrCl is 114 mL/min.

## 2018-02-14 DIAGNOSIS — R69 Illness, unspecified: Secondary | ICD-10-CM | POA: Diagnosis not present

## 2018-02-14 DIAGNOSIS — J322 Chronic ethmoidal sinusitis: Secondary | ICD-10-CM | POA: Diagnosis not present

## 2018-02-14 DIAGNOSIS — H9041 Sensorineural hearing loss, unilateral, right ear, with unrestricted hearing on the contralateral side: Secondary | ICD-10-CM | POA: Diagnosis not present

## 2018-02-14 DIAGNOSIS — H9311 Tinnitus, right ear: Secondary | ICD-10-CM | POA: Diagnosis not present

## 2018-02-14 DIAGNOSIS — J320 Chronic maxillary sinusitis: Secondary | ICD-10-CM | POA: Diagnosis not present

## 2018-02-14 DIAGNOSIS — J040 Acute laryngitis: Secondary | ICD-10-CM | POA: Diagnosis not present

## 2018-02-21 ENCOUNTER — Ambulatory Visit (INDEPENDENT_AMBULATORY_CARE_PROVIDER_SITE_OTHER): Payer: Managed Care, Other (non HMO) | Admitting: Podiatry

## 2018-02-21 ENCOUNTER — Encounter: Payer: Self-pay | Admitting: Podiatry

## 2018-02-21 DIAGNOSIS — Z1389 Encounter for screening for other disorder: Secondary | ICD-10-CM | POA: Diagnosis not present

## 2018-02-21 DIAGNOSIS — I7389 Other specified peripheral vascular diseases: Secondary | ICD-10-CM | POA: Diagnosis not present

## 2018-02-21 DIAGNOSIS — G4733 Obstructive sleep apnea (adult) (pediatric): Secondary | ICD-10-CM | POA: Diagnosis not present

## 2018-02-21 DIAGNOSIS — I480 Paroxysmal atrial fibrillation: Secondary | ICD-10-CM | POA: Diagnosis not present

## 2018-02-21 DIAGNOSIS — E1151 Type 2 diabetes mellitus with diabetic peripheral angiopathy without gangrene: Secondary | ICD-10-CM | POA: Diagnosis not present

## 2018-02-21 DIAGNOSIS — E7849 Other hyperlipidemia: Secondary | ICD-10-CM | POA: Diagnosis not present

## 2018-02-21 DIAGNOSIS — D333 Benign neoplasm of cranial nerves: Secondary | ICD-10-CM | POA: Diagnosis not present

## 2018-02-21 DIAGNOSIS — D3613 Benign neoplasm of peripheral nerves and autonomic nervous system of lower limb, including hip: Secondary | ICD-10-CM | POA: Diagnosis not present

## 2018-02-21 DIAGNOSIS — Z6839 Body mass index (BMI) 39.0-39.9, adult: Secondary | ICD-10-CM | POA: Diagnosis not present

## 2018-02-21 DIAGNOSIS — I10 Essential (primary) hypertension: Secondary | ICD-10-CM | POA: Diagnosis not present

## 2018-02-21 NOTE — Progress Notes (Signed)
She presents today for a follow-up of her neuroma third interdigital space of her right foot.  She states that is approximately 90 to 95% improved but it really hurt for 2 weeks after the last injection.  Objective: Vital signs are stable she is alert and oriented x3 still palpable Mulder's click with pain on palpation to the third interdigital space of the right foot but much decreased from previous evaluations.  Pulses remain palpable no open lesions or wounds.  Assessment: Neuroma resolving at approximately 95% right third interdigital space.  Plan: After sterile Betadine skin prep I went ahead and injected her with 4% dehydrated alcohol a total of 22 cc was injected to the third interdigital space of the right foot.  I will follow-up with her in 3 weeks.

## 2018-03-14 ENCOUNTER — Ambulatory Visit (INDEPENDENT_AMBULATORY_CARE_PROVIDER_SITE_OTHER): Payer: Managed Care, Other (non HMO) | Admitting: Podiatry

## 2018-03-14 ENCOUNTER — Encounter: Payer: Self-pay | Admitting: Podiatry

## 2018-03-14 DIAGNOSIS — M7751 Other enthesopathy of right foot: Secondary | ICD-10-CM | POA: Diagnosis not present

## 2018-03-14 DIAGNOSIS — M779 Enthesopathy, unspecified: Principal | ICD-10-CM

## 2018-03-14 DIAGNOSIS — M778 Other enthesopathies, not elsewhere classified: Secondary | ICD-10-CM

## 2018-03-14 NOTE — Progress Notes (Signed)
She presents today states that her neuroma is doing fine she does has pain to the dorsal aspect of the right foot.  Objective: Vital signs are stable she is alert and oriented x3 she has pain on range of motion particular frontal plane range of motion of the first through the fifth metatarsal tarsal joints.  She has no palpable neuroma today.  Assessment: Capsulitis tarsometatarsal joints dorsal lateral aspect.  Assessment: I injected the area today with 20 mg Kenalog 5 mg Marcaine point maximal tenderness dorsal lateral aspect of the tarsometatarsal joints.  Tolerated procedure well without complications.  Follow-up with her in 1 month if necessary.

## 2018-03-25 DIAGNOSIS — G4733 Obstructive sleep apnea (adult) (pediatric): Secondary | ICD-10-CM | POA: Diagnosis not present

## 2018-03-25 DIAGNOSIS — R69 Illness, unspecified: Secondary | ICD-10-CM | POA: Diagnosis not present

## 2018-03-27 DIAGNOSIS — G4733 Obstructive sleep apnea (adult) (pediatric): Secondary | ICD-10-CM | POA: Diagnosis not present

## 2018-03-28 DIAGNOSIS — L814 Other melanin hyperpigmentation: Secondary | ICD-10-CM | POA: Diagnosis not present

## 2018-03-28 DIAGNOSIS — L819 Disorder of pigmentation, unspecified: Secondary | ICD-10-CM | POA: Diagnosis not present

## 2018-03-28 DIAGNOSIS — L821 Other seborrheic keratosis: Secondary | ICD-10-CM | POA: Diagnosis not present

## 2018-03-28 DIAGNOSIS — D1801 Hemangioma of skin and subcutaneous tissue: Secondary | ICD-10-CM | POA: Diagnosis not present

## 2018-04-16 ENCOUNTER — Ambulatory Visit: Payer: Managed Care, Other (non HMO) | Admitting: Podiatry

## 2018-06-04 DIAGNOSIS — R0600 Dyspnea, unspecified: Secondary | ICD-10-CM | POA: Diagnosis not present

## 2018-06-04 DIAGNOSIS — Z6839 Body mass index (BMI) 39.0-39.9, adult: Secondary | ICD-10-CM | POA: Diagnosis not present

## 2018-06-04 DIAGNOSIS — I10 Essential (primary) hypertension: Secondary | ICD-10-CM | POA: Diagnosis not present

## 2018-06-04 DIAGNOSIS — R002 Palpitations: Secondary | ICD-10-CM | POA: Diagnosis not present

## 2018-06-04 DIAGNOSIS — I480 Paroxysmal atrial fibrillation: Secondary | ICD-10-CM | POA: Diagnosis not present

## 2018-06-04 DIAGNOSIS — R42 Dizziness and giddiness: Secondary | ICD-10-CM | POA: Diagnosis not present

## 2018-06-07 ENCOUNTER — Ambulatory Visit: Payer: Medicare HMO | Admitting: Cardiology

## 2018-06-07 ENCOUNTER — Encounter: Payer: Self-pay | Admitting: Cardiology

## 2018-06-07 VITALS — BP 128/70 | HR 59 | Ht 63.0 in | Wt 215.0 lb

## 2018-06-07 DIAGNOSIS — I480 Paroxysmal atrial fibrillation: Secondary | ICD-10-CM | POA: Diagnosis not present

## 2018-06-07 DIAGNOSIS — G4733 Obstructive sleep apnea (adult) (pediatric): Secondary | ICD-10-CM | POA: Diagnosis not present

## 2018-06-07 DIAGNOSIS — I10 Essential (primary) hypertension: Secondary | ICD-10-CM

## 2018-06-07 DIAGNOSIS — Z79899 Other long term (current) drug therapy: Secondary | ICD-10-CM | POA: Diagnosis not present

## 2018-06-07 DIAGNOSIS — E785 Hyperlipidemia, unspecified: Secondary | ICD-10-CM

## 2018-06-07 MED ORDER — FLECAINIDE ACETATE 50 MG PO TABS: 50.0000 mg | ORAL_TABLET | Freq: Two times a day (BID) | ORAL | 3 refills | Status: DC

## 2018-06-07 NOTE — Progress Notes (Signed)
Cardiology Office Note    Date:  06/07/2018   ID:  ALYNNA Kim, DOB November 30, 1945, MRN [id number]  PCP:  [name], MD  Cardiologist:   [name], MD     History of Present Illness:  Erika Kim is a 72 y.o. female here for follow up of atrial fibrillation having previously been seen in the emergency department on 02/18/16. She returned from dinner, went upstairs and her heart began to skip. She felt like her heart was going to jump out of her chest. Lasted one-two hours. Xarelto was started for atrial fibrillation, risk score of 4. Atrial fibrillation was noted on 02/17/16, normal rate. The next day 02/18/16 at 2 AM, she was in sinus rhythm.  Since that emergency room visit, she may have had a few more episodes of atrial fibrillation, usually they last approximately one hour. No prior strokes.  Positive for diabetes, hypertension, female, age greater than 23.  No bleeding issues. Tolerating Xarelto well. Has been on Toprol.  Both grandparents had CVA's. Father with electrical issues with heart. Mother had CP. CHF.   05/22/16-Felt a skip last week. Rare. Tolerating Xarelto well, no bleeding. Worried about do-nut hole. No chest pain, no shortness of breath. She is excited, just bought a new camper. They're going down to [place] for the winter.  09/21/16 - blood on tongue one night, Xarelto. This subsided. She is having no significant palpitations. When she exercises sometimes her heart rate goes up to 140, she slows down and her heart rate slows down as well. They're going to [place] in their camper. No chest pain. No syncopal.  09/21/17-overall is been doing quite well.  She had been diagnosed with endometrial cancer stage 0 and underwent robotic total hysterectomy on 03/02/17 by Dr. Denman George.  She is back on her Xarelto.  Minor bleeding post surgery.  Has had rare palpitations.  No syncope chest pain orthopnea.  Minor episode yesterday of atrial fibrillation she believes.  She  knows to take an extra Toprol if necessary.  Doing well otherwise.  No syncope bleeding orthopnea PND.  She is continuing with her Xarelto.  Past Medical History:  Diagnosis Date  . Acoustic neuroma ([place]) followed by dr brown at [facility]   right side w/ chronic roaring noise  . Anticoagulant long-term use    xarelto  . Benign paroxysmal positional vertigo   . Endometrial polyp   . Frequency of urination   . Hyperlipidemia   . Hypertension   . OA (osteoarthritis)    left knee  . OSA on CPAP   . PAF (paroxysmal atrial fibrillation) [facility]) cardiologist-  dr Marlou Porch   dx 04/ 2017  . PMB (postmenopausal bleeding)   . TMJ (temporomandibular joint disorder)    left side-- wears guard  . Type 2 diabetes mellitus ([place])   . Wears contact lenses     Past Surgical History:  Procedure Laterality Date  . CARPAL TUNNEL RELEASE Right 04/17/2007   w/ Excision ganglion cyst and Pulley Release right thumb  . DILATATION & CURETTAGE/HYSTEROSCOPY WITH MYOSURE N/A 01/12/2017   Procedure: DILATATION & CURETTAGE/HYSTEROSCOPY WITH MYOSURE;  Surgeon: Arvella Nigh, MD;  Location: [place];  Service: Gynecology;  Laterality: N/A;  . HAMMER TOE SURGERY Left 2010   left second toe  . KNEE ARTHROSCOPY Right 2010  . KNEE SURGERY Left 1993  . LYMPH NODE BIOPSY N/A 03/01/2017   Procedure: SENTINEL LYMPH NODE BIOPSY;  Surgeon: Everitt Amber, MD;  [facility];  Service: Gynecology;  Laterality: N/A;  . NASAL SINUS SURGERY  1985 and 1995  . ROBOTIC ASSISTED TOTAL HYSTERECTOMY WITH BILATERAL SALPINGO OOPHERECTOMY Bilateral 03/01/2017   Procedure: XI ROBOTIC ASSISTED TOTAL HYSTERECTOMY WITH BILATERAL SALPINGO OOPHORECTOMY;  Surgeon: Everitt Amber, MD;  [facility];  Service: Gynecology;  Laterality: Bilateral;  . ROTATOR CUFF REPAIR Right 04/2004  . TOE SURGERY Right 2012   Great toe and second toe  . TOTAL ELBOW REPLACEMENT Right 2000  . TOTAL KNEE ARTHROPLASTY Right 01/12/2010  .  TRANSTHORACIC ECHOCARDIOGRAM  03/28/2016   ef 60-65%/ mild AR and MR  . TUBAL LIGATION Bilateral 1978    Current Medications: Outpatient Medications Prior to Visit  Medication Sig Dispense Refill  . acetaminophen (TYLENOL) 500 MG tablet Take 500-1,000 mg by mouth daily as needed for mild pain, moderate pain, fever or headache.     Marland Kitchen atorvastatin (LIPITOR) 40 MG tablet Take 1 tablet by mouth every morning.     . calcium carbonate (OS-CAL) 600 MG TABS tablet Take 600 mg by mouth 2 (two) times daily.     . Cholecalciferol (VITAMIN D3) 5000 units TABS Take 1 tablet by mouth daily.    Marland Kitchen ipratropium (ATROVENT) 0.03 % nasal spray Place 1-2 sprays into the nose at bedtime as needed for rhinitis.     Marland Kitchen JANUVIA 100 MG tablet Take 1 tablet by mouth daily after supper.    Marland Kitchen lisinopril-hydrochlorothiazide (PRINZIDE,ZESTORETIC) 20-12.5 MG per tablet Take 1 tablet by mouth every morning.     Marland Kitchen losartan-hydrochlorothiazide (HYZAAR) 100-12.5 MG tablet     . metoprolol succinate (TOPROL-XL) 50 MG 24 hr tablet Take 1 tablet by mouth See admin instructions. Takes twice daily, may take a third time if heart goes into a-fib    . Multiple Vitamin (MULTIVITAMIN) tablet Take 1 tablet by mouth daily.    Vladimir Faster Glycol-Propyl Glycol (SYSTANE OP) Place 1 drop into both eyes daily as needed (dry eyes).    . saline (AYR) GEL Place 1 application into the nose at bedtime.    Alveda Reasons 20 MG TABS tablet TAKE ONE TABLET BY MOUTH ONE TIME DAILY WITH SUPPER 90 tablet 2  . azithromycin (ZITHROMAX) 250 MG tablet Take 1 tablet by mouth daily.    . GuaiFENesin (MUCINEX PO) Take 400 mg by mouth daily.     No facility-administered medications prior to visit.      Allergies:   Cephalosporins and Augmentin [amoxicillin-pot clavulanate]   Social History   Socioeconomic History  . Marital status: Married    Spouse name: Not on file  . Number of children: Not on file  . Years of education: Not on file  . Highest education  level: Not on file  Occupational History  . Not on file  Social Needs  . Financial resource strain: Not on file  . Food insecurity:    Worry: Not on file    Inability: Not on file  . Transportation needs:    Medical: Not on file    Non-medical: Not on file  Tobacco Use  . Smoking status: Never Smoker  . Smokeless tobacco: Never Used  Substance and Sexual Activity  . Alcohol use: No  . Drug use: No  . Sexual activity: Not on file  Lifestyle  . Physical activity:    Days per week: Not on file    Minutes per session: Not on file  . Stress: Not on file  Relationships  . Social connections:    Talks on  phone: Not on file    Gets together: Not on file    Attends religious service: Not on file    Active member of club or organization: Not on file    Attends meetings of clubs or organizations: Not on file    Relationship status: Not on file  Other Topics Concern  . Not on file  Social History Narrative   Married w/2 children (boy and girl).  Retired.     Family History:  The patient's family history includes Heart failure in her father and mother.   ROS:   Please see the history of present illness.  Review of Systems  All other systems reviewed and are negative.     PHYSICAL EXAM:   VS:  BP 128/70   Pulse (!) 59   Ht 5\' 3"  (1.6 m)   Wt 215 lb (97.5 kg)   BMI 38.09 kg/m    GEN: Well nourished, well developed, in no acute distress obese HEENT: normal  Neck: no JVD, carotid bruits, or masses Cardiac: RRR; soft SM no rubs, or gallops,no edema  Respiratory:  clear to auscultation bilaterally, normal work of breathing GI: soft, nontender, nondistended, + BS MS: no deformity or atrophy  Skin: warm and dry, no rash Neuro:  Alert and Oriented x 3, Strength and sensation are intact Psych: euthymic mood, full affect   Wt Readings from Last 3 Encounters:  06/07/18 215 lb (97.5 kg)  09/21/17 212 lb 12.8 oz (96.5 kg)  08/27/17 213 lb 8 oz (96.8 kg)      Studies/Labs  Reviewed:   EKG: 06/07/2018-sinus bradycardia 59 with possible left atrial enlargement, poor R wave progression.  Personally viewed.  Recent Labs: No results found for requested labs within last 8760 hours.   Lipid Panel No results found for: CHOL, TRIG, HDL, CHOLHDL, VLDL, LDLCALC, LDLDIRECT  Additional studies/ records that were reviewed today include:  Prior office note from Dr. Sharlett Iles, lab work reviewed.  ECHO 03/28/16: - Left ventricle: The cavity size was normal. Systolic function was   normal. The estimated ejection fraction was in the range of 60%   to 65%. Wall motion was normal; there were no regional wall   motion abnormalities. The transmitral flow pattern was normal.   Left ventricular diastolic function parameters were normal. - Aortic valve: Moderate focal calcification, consistent with   sclerosis. There was mild regurgitation. - Mitral valve: There was mild regurgitation.    ASSESSMENT:    1. Paroxysmal atrial fibrillation (HCC)   2. Essential hypertension   3. High risk medication use   4. Hyperlipidemia, unspecified hyperlipidemia type   5. Obstructive sleep apnea      PLAN:  In order of problems listed above:  Paroxysmal atrial fibrillation  - Xarelto - CHADS-VASc -4   - Brief episodes, 1-2 hours, her primary care physician caught her in atrial fibrillation.   - Take extra Toprol 50 mg if she is having rapid heartbeat. She wears a fitbit and sometimes this helps her.    -Because she is having more episodes, I will start her on antiarrhythmic therapy. She is no history of coronary artery disease. Perhaps a class IA agent would be helpful.  We will start flecainide 50 mg twice a day.  She will start this in approximately 15 days after she gets back from her trip to [facility].  A week after starting, I will have her come back in for a treadmill test to make sure medication is safe.    -  Discussed weight loss at prior appointments. She wears CPAP for  obstructive sleep apnea. Exercise. Decaffeinated coffee. No decongestants.   OSA  - CPAP treatment, she continues to wear  Obesity  - Weight loss low carbohydrates, discussed once again, she is exercising at the gym 4 days a week.  Elliptical.  Continue to encourage  Essential hypertension  - Multidrug regimen including losartan HCT, change for her.    - She has actually been on Toprol for some time.  Doing well.  Well controlled.  Hyperlipidemia  - Continue atorvastatin, no side effects.  LDL 84, HDL 40 in the past.  Diabetes  - Januvia, Dr. Sharlett Iles monitoring closely.  Hemoglobin A1c 5.9.  Excellent.  Endometrial cancer -Status post hysterectomy Dr. Denman George.  She is doing well.  She states that she had no pain post surgery.  No changes made  They have 1/5 wheeler.  Her husband enjoys remote Lex[employer].  We will see her back in as previously scheduled    Medication Adjustments/Labs and Tests Ordered: Current medicines are reviewed at length with the patient today.  Concerns regarding medicines are outlined above.  Medication changes, Labs and Tests ordered today are listed in the Patient Instructions below. Patient Instructions  Medication Instructions:  Please start Flecainide 50 mg twice a day - once you return from your trip. Continue all other medications as listed.  Testing/Procedures: Your physician has requested that you have an exercise tolerance test one week after you start Flecainide (September 6, 9 or 10). For further information please visit [email or web address]. Please also follow instruction sheet, as given.  Follow-Up: Follow up in November as scheduled.  If you need a refill on your cardiac medications before your next appointment, please call your pharmacy.  Thank you for choosing [facility]!!        Signed, [name], MD  06/07/2018 9:54 AM    [facility] [place], [place], [place]  [address] Phone:  [phone]; Fax: [phone]

## 2018-06-07 NOTE — Patient Instructions (Addendum)
Medication Instructions:  Please start Flecainide 50 mg twice a day - once you return from your trip. Continue all other medications as listed.  Testing/Procedures: Your physician has requested that you have an exercise tolerance test one week after you start Flecainide (September 6, 9 or 10). For further information please visit [email or web address]. Please also follow instruction sheet, as given.  Follow-Up: Follow up in November as scheduled.  If you need a refill on your cardiac medications before your next appointment, please call your pharmacy.  Thank you for choosing [place]!!

## 2018-06-27 ENCOUNTER — Ambulatory Visit: Payer: Medicare HMO | Admitting: Podiatry

## 2018-06-27 ENCOUNTER — Encounter: Payer: Self-pay | Admitting: Podiatry

## 2018-06-27 DIAGNOSIS — M779 Enthesopathy, unspecified: Secondary | ICD-10-CM | POA: Diagnosis not present

## 2018-06-27 DIAGNOSIS — M778 Other enthesopathies, not elsewhere classified: Secondary | ICD-10-CM

## 2018-06-27 DIAGNOSIS — G4733 Obstructive sleep apnea (adult) (pediatric): Secondary | ICD-10-CM | POA: Diagnosis not present

## 2018-06-28 ENCOUNTER — Ambulatory Visit (INDEPENDENT_AMBULATORY_CARE_PROVIDER_SITE_OTHER): Payer: Medicare HMO

## 2018-06-28 DIAGNOSIS — I480 Paroxysmal atrial fibrillation: Secondary | ICD-10-CM | POA: Diagnosis not present

## 2018-06-28 DIAGNOSIS — Z79899 Other long term (current) drug therapy: Secondary | ICD-10-CM

## 2018-06-28 LAB — EXERCISE TOLERANCE TEST
CHL CUP MPHR: 150 {beats}/min
CHL RATE OF PERCEIVED EXERTION: 15
CSEPED: 6 min
CSEPEDS: 11 s
CSEPHR: 78 %
Estimated workload: 7.2 METS
Peak HR: 118 {beats}/min
Rest HR: 60 {beats}/min

## 2018-06-29 DIAGNOSIS — Z23 Encounter for immunization: Secondary | ICD-10-CM | POA: Diagnosis not present

## 2018-06-29 NOTE — Progress Notes (Signed)
She presents today for follow-up of capsulitis dorsal aspect of the right foot.  States it hurts on the top of the foot and around the ankle I been to [facility] did a lot of walking really upset it.  Objective: Vital signs are stable she is alert and oriented x3.  Has pain on palpation dorsal aspect of the right foot along Lisfranc's joints.  Neuroma is not as tender on the right foot.  Assessment: Dorsal tarsal/Lisfranc's osteoarthritis and capsulitis.  Resolving neuroma.  Plan: After sterile Betadine skin prep I injected 20 mg Kenalog 5 mg Marcaine dorsal aspect of the right foot.  Tolerated procedure well without complications follow-up with her on an as-needed basis.

## 2018-07-03 DIAGNOSIS — Z124 Encounter for screening for malignant neoplasm of cervix: Secondary | ICD-10-CM | POA: Diagnosis not present

## 2018-07-03 DIAGNOSIS — N958 Other specified menopausal and perimenopausal disorders: Secondary | ICD-10-CM | POA: Diagnosis not present

## 2018-07-03 DIAGNOSIS — Z6838 Body mass index (BMI) 38.0-38.9, adult: Secondary | ICD-10-CM | POA: Diagnosis not present

## 2018-07-03 DIAGNOSIS — Z779 Other contact with and (suspected) exposures hazardous to health: Secondary | ICD-10-CM | POA: Diagnosis not present

## 2018-07-03 DIAGNOSIS — M8588 Other specified disorders of bone density and structure, other site: Secondary | ICD-10-CM | POA: Diagnosis not present

## 2018-07-04 ENCOUNTER — Telehealth: Payer: Self-pay | Admitting: Cardiology

## 2018-07-04 NOTE — Telephone Encounter (Signed)
New Message:     Patient is calling for result

## 2018-07-04 NOTE — Telephone Encounter (Signed)
Reviewed results of GXT with patient who understands to continue medications as listed.

## 2018-07-10 DIAGNOSIS — N904 Leukoplakia of vulva: Secondary | ICD-10-CM | POA: Diagnosis not present

## 2018-07-10 DIAGNOSIS — N9089 Other specified noninflammatory disorders of vulva and perineum: Secondary | ICD-10-CM | POA: Diagnosis not present

## 2018-07-23 DIAGNOSIS — R69 Illness, unspecified: Secondary | ICD-10-CM | POA: Diagnosis not present

## 2018-08-16 DIAGNOSIS — H43812 Vitreous degeneration, left eye: Secondary | ICD-10-CM | POA: Diagnosis not present

## 2018-08-16 DIAGNOSIS — H04123 Dry eye syndrome of bilateral lacrimal glands: Secondary | ICD-10-CM | POA: Diagnosis not present

## 2018-08-16 DIAGNOSIS — H35041 Retinal micro-aneurysms, unspecified, right eye: Secondary | ICD-10-CM | POA: Diagnosis not present

## 2018-08-16 DIAGNOSIS — H25813 Combined forms of age-related cataract, bilateral: Secondary | ICD-10-CM | POA: Diagnosis not present

## 2018-08-19 DIAGNOSIS — E1151 Type 2 diabetes mellitus with diabetic peripheral angiopathy without gangrene: Secondary | ICD-10-CM | POA: Diagnosis not present

## 2018-08-19 DIAGNOSIS — E7849 Other hyperlipidemia: Secondary | ICD-10-CM | POA: Diagnosis not present

## 2018-08-19 DIAGNOSIS — I10 Essential (primary) hypertension: Secondary | ICD-10-CM | POA: Diagnosis not present

## 2018-08-22 DIAGNOSIS — R69 Illness, unspecified: Secondary | ICD-10-CM | POA: Diagnosis not present

## 2018-08-26 DIAGNOSIS — G4733 Obstructive sleep apnea (adult) (pediatric): Secondary | ICD-10-CM | POA: Diagnosis not present

## 2018-08-26 DIAGNOSIS — Z6839 Body mass index (BMI) 39.0-39.9, adult: Secondary | ICD-10-CM | POA: Diagnosis not present

## 2018-08-26 DIAGNOSIS — I10 Essential (primary) hypertension: Secondary | ICD-10-CM | POA: Diagnosis not present

## 2018-08-26 DIAGNOSIS — E7849 Other hyperlipidemia: Secondary | ICD-10-CM | POA: Diagnosis not present

## 2018-08-26 DIAGNOSIS — Z1389 Encounter for screening for other disorder: Secondary | ICD-10-CM | POA: Diagnosis not present

## 2018-08-26 DIAGNOSIS — Z0000 Encounter for general adult medical examination without abnormal findings: Secondary | ICD-10-CM | POA: Diagnosis not present

## 2018-08-26 DIAGNOSIS — E1151 Type 2 diabetes mellitus with diabetic peripheral angiopathy without gangrene: Secondary | ICD-10-CM | POA: Diagnosis not present

## 2018-08-26 DIAGNOSIS — I7389 Other specified peripheral vascular diseases: Secondary | ICD-10-CM | POA: Diagnosis not present

## 2018-08-26 DIAGNOSIS — R82998 Other abnormal findings in urine: Secondary | ICD-10-CM | POA: Diagnosis not present

## 2018-08-26 DIAGNOSIS — I480 Paroxysmal atrial fibrillation: Secondary | ICD-10-CM | POA: Diagnosis not present

## 2018-08-27 ENCOUNTER — Ambulatory Visit: Payer: Medicare HMO | Admitting: Adult Health

## 2018-08-27 ENCOUNTER — Encounter: Payer: Self-pay | Admitting: Adult Health

## 2018-08-27 VITALS — BP 109/55 | HR 54 | Ht 62.5 in | Wt 214.6 lb

## 2018-08-27 DIAGNOSIS — Z9989 Dependence on other enabling machines and devices: Secondary | ICD-10-CM

## 2018-08-27 DIAGNOSIS — G4733 Obstructive sleep apnea (adult) (pediatric): Secondary | ICD-10-CM

## 2018-08-27 NOTE — Progress Notes (Addendum)
PATIENT: Erika Kim DOB: 07/15/1946  REASON FOR VISIT: follow up- osa on cpap HISTORY FROM: patient  HISTORY OF PRESENT ILLNESS: Today 08/27/18:  Erika Kim is a 72 year old female with a history of obstructive sleep apnea on CPAP.  She returns today for follow-up.  Her CPAP download indicates that she use machine nightly for compliance of 100%.  She is machine greater than 4 hours each night.  On average she uses her machine 7 hours and 11 minutes.  Her residual AHI is 1 on 8 cm of water with EPR of 2.  She does not have a significant leak.  She reports that the CPAP continues to work well.  She states that there are some nights she has to get up to urinate.  She also reports that her heart rate has been jumping up during the night.  She reports that her cardiologist has recently changed her medication.  She returns today for evaluation.  HISTORY 08/27/2017: I reviewed her CPAP compliance data from 07/28/2017 through 08/26/2017 which is a total of 30 days, during which time she used her machine every night with percent used days greater than 4 hours at 100%, indicating superb compliance with an average usage of 7 hours and 39 minutes, residual AHI at goal at 1.2 per hour, leak on the low side with the 95th percentile at 3.1 L/m on a pressure of 8 cm with EPR of 2. She reports not sleeping as well as she used to, tends to stress over things at night. Had hysterectomy on 03/01/17 under Dr. Denman George. She had a colonoscopy in August. Had some polyps taken out.   REVIEW OF SYSTEMS: Out of a complete 14 system review of symptoms, the patient complains only of the following symptoms, and all other reviewed systems are negative.  ALLERGIES: Allergies  Allergen Reactions  . Cephalosporins Itching    severe  . Augmentin [Amoxicillin-Pot Clavulanate] Itching    severe    HOME MEDICATIONS: Outpatient Medications Prior to Visit  Medication Sig Dispense Refill  . acetaminophen (TYLENOL) 500 MG  tablet Take 500-1,000 mg by mouth daily as needed for mild pain, moderate pain, fever or headache.     Marland Kitchen atorvastatin (LIPITOR) 40 MG tablet Take 1 tablet by mouth every morning.     . calcium carbonate (OS-CAL) 600 MG TABS tablet Take 600 mg by mouth 2 (two) times daily.     . Cholecalciferol (VITAMIN D3) 5000 units TABS Take 1 tablet by mouth daily.    . flecainide (TAMBOCOR) 50 MG tablet Take 1 tablet (50 mg total) by mouth 2 (two) times daily. 180 tablet 3  . ipratropium (ATROVENT) 0.03 % nasal spray Place 1-2 sprays into the nose at bedtime as needed for rhinitis.     Marland Kitchen JANUVIA 100 MG tablet Take 1 tablet by mouth daily after supper.    Marland Kitchen lisinopril-hydrochlorothiazide (PRINZIDE,ZESTORETIC) 20-12.5 MG per tablet Take 1 tablet by mouth every morning.     Marland Kitchen losartan-hydrochlorothiazide (HYZAAR) 100-12.5 MG tablet     . metoprolol succinate (TOPROL-XL) 50 MG 24 hr tablet Take 1 tablet by mouth See admin instructions. Takes twice daily, may take a third time if heart goes into a-fib    . Multiple Vitamin (MULTIVITAMIN) tablet Take 1 tablet by mouth daily.    Vladimir Faster Glycol-Propyl Glycol (SYSTANE OP) Place 1 drop into both eyes daily as needed (dry eyes).    . saline (AYR) GEL Place 1 application into the nose at bedtime.    Marland Kitchen  XARELTO 20 MG TABS tablet TAKE ONE TABLET BY MOUTH ONE TIME DAILY WITH SUPPER 90 tablet 2   No facility-administered medications prior to visit.     PAST MEDICAL HISTORY: Past Medical History:  Diagnosis Date  . Acoustic neuroma ([place]) followed by dr brown at [facility]   right side w/ chronic roaring noise  . Anticoagulant long-term use    xarelto  . Benign paroxysmal positional vertigo   . Endometrial polyp   . Frequency of urination   . Hyperlipidemia   . Hypertension   . OA (osteoarthritis)    left knee  . OSA on CPAP   . PAF (paroxysmal atrial fibrillation) [facility]) cardiologist-  dr Marlou Porch   dx 04/ 2017  . PMB (postmenopausal bleeding)   . TMJ  (temporomandibular joint disorder)    left side-- wears guard  . Type 2 diabetes mellitus ([place])   . Wears contact lenses     PAST SURGICAL HISTORY: Past Surgical History:  Procedure Laterality Date  . CARPAL TUNNEL RELEASE Right 04/17/2007   w/ Excision ganglion cyst and Pulley Release right thumb  . DILATATION & CURETTAGE/HYSTEROSCOPY WITH MYOSURE N/A 01/12/2017   Procedure: DILATATION & CURETTAGE/HYSTEROSCOPY WITH MYOSURE;  Surgeon: Arvella Nigh, MD;  Location: [place];  Service: Gynecology;  Laterality: N/A;  . HAMMER TOE SURGERY Left 2010   left second toe  . KNEE ARTHROSCOPY Right 2010  . KNEE SURGERY Left 1993  . LYMPH NODE BIOPSY N/A 03/01/2017   Procedure: SENTINEL LYMPH NODE BIOPSY;  Surgeon: Everitt Amber, MD;  [facility];  Service: Gynecology;  Laterality: N/A;  . NASAL SINUS SURGERY  1985 and 1995  . ROBOTIC ASSISTED TOTAL HYSTERECTOMY WITH BILATERAL SALPINGO OOPHERECTOMY Bilateral 03/01/2017   Procedure: XI ROBOTIC ASSISTED TOTAL HYSTERECTOMY WITH BILATERAL SALPINGO OOPHORECTOMY;  Surgeon: Everitt Amber, MD;  [facility];  Service: Gynecology;  Laterality: Bilateral;  . ROTATOR CUFF REPAIR Right 04/2004  . TOE SURGERY Right 2012   Great toe and second toe  . TOTAL ELBOW REPLACEMENT Right 2000  . TOTAL KNEE ARTHROPLASTY Right 01/12/2010  . TRANSTHORACIC ECHOCARDIOGRAM  03/28/2016   ef 60-65%/ mild AR and MR  . TUBAL LIGATION Bilateral 1978    FAMILY HISTORY: Family History  Problem Relation Age of Onset  . Heart failure Father   . Heart failure Mother     SOCIAL HISTORY: Social History   Socioeconomic History  . Marital status: Married    Spouse name: Not on file  . Number of children: Not on file  . Years of education: Not on file  . Highest education level: Not on file  Occupational History  . Not on file  Social Needs  . Financial resource strain: Not on file  . Food insecurity:    Worry: Not on file    Inability: Not on file    . Transportation needs:    Medical: Not on file    Non-medical: Not on file  Tobacco Use  . Smoking status: Never Smoker  . Smokeless tobacco: Never Used  Substance and Sexual Activity  . Alcohol use: No  . Drug use: No  . Sexual activity: Not on file  Lifestyle  . Physical activity:    Days per week: Not on file    Minutes per session: Not on file  . Stress: Not on file  Relationships  . Social connections:    Talks on phone: Not on file    Gets together: Not on file  Attends religious service: Not on file    Active member of club or organization: Not on file    Attends meetings of clubs or organizations: Not on file    Relationship status: Not on file  . Intimate partner violence:    Fear of current or ex partner: Not on file    Emotionally abused: Not on file    Physically abused: Not on file    Forced sexual activity: Not on file  Other Topics Concern  . Not on file  Social History Narrative   Married w/2 children (boy and girl).  Retired.      PHYSICAL EXAM  Vitals:   08/27/18 1015  BP: (!) 109/55  Pulse: (!) 54  Weight: 214 lb 9.6 oz (97.3 kg)  Height: 5' 2.5" (1.588 m)   Body mass index is 38.63 kg/m.  Generalized: Well developed, in no acute distress   Neurological examination  Mentation: Alert oriented to time, place, history taking. Follows all commands speech and language fluent Cranial nerve II-XII: Pupils were equal round reactive to light. Extraocular movements were full, visual field were full on confrontational test. Facial sensation and strength were normal. Uvula tongue midline. Head turning and shoulder shrug  were normal and symmetric.  Neck circumference 14 inches Motor: The motor testing reveals 5 over 5 strength of all 4 extremities. Good symmetric motor tone is noted throughout.  Sensory: Sensory testing is intact to soft touch on all 4 extremities. No evidence of extinction is noted.  Coordination: Cerebellar testing reveals good  finger-nose-finger and heel-to-shin bilaterally.  Gait and station: Gait is normal.  Reflexes: Deep tendon reflexes are symmetric and normal bilaterally.   DIAGNOSTIC DATA (LABS, IMAGING, TESTING) - I reviewed patient records, labs, notes, testing and imaging myself where available.  Lab Results  Component Value Date   WBC 12.4 (H) 03/02/2017   HGB 13.4 03/02/2017   HCT 40.4 03/02/2017   MCV 86.7 03/02/2017   PLT 155 03/02/2017      Component Value Date/Time   NA 136 03/02/2017 0448   K 4.1 03/02/2017 0448   CL 104 03/02/2017 0448   CO2 23 03/02/2017 0448   GLUCOSE 160 (H) 03/02/2017 0448   BUN 11 03/02/2017 0448   CREATININE 0.68 03/02/2017 0448   CALCIUM 9.1 03/02/2017 0448   PROT 6.6 02/19/2017 0845   ALBUMIN 4.2 02/19/2017 0845   AST 46 (H) 02/19/2017 0845   ALT 75 (H) 02/19/2017 0845   ALKPHOS 50 02/19/2017 0845   BILITOT 1.1 02/19/2017 0845   GFRNONAA >60 03/02/2017 0448   GFRAA >60 03/02/2017 0448       ASSESSMENT AND PLAN 72 y.o. year old female  has a past medical history of Acoustic neuroma ([place]) (followed by dr brown at [facility]), Anticoagulant long-term use, Benign paroxysmal positional vertigo, Endometrial polyp, Frequency of urination, Hyperlipidemia, Hypertension, OA (osteoarthritis), OSA on CPAP, PAF (paroxysmal atrial fibrillation) ([place]) (cardiologist-  dr Marlou Porch), PMB (postmenopausal bleeding), TMJ (temporomandibular joint disorder), Type 2 diabetes mellitus ([place]), and Wears contact lenses. here with:  1.  Obstructive sleep apnea on CPAP  The patient CPAP download shows excellent compliance and good treatment of her apnea.  She is encouraged to use the CPAP nightly and greater than 4 hours each night.  She is advised that if her symptoms worsen or she develops new symptoms she should let us know.  She will follow-up in 1 year or sooner if needed.   I spent 15 minutes with the patient. 50%  of this time was spent reviewing CPAP download   Ward Givens, MSN, NP-C 08/27/2018, 10:13 AM [facility] [address], [place], [place] [address] [phone]  I reviewed the above note and documentation by the Nurse Practitioner and agree with the history, physical exam, assessment and plan as outlined above. I was immediately available for face-to-face consultation. Star Age, MD, PhD [facility])

## 2018-08-27 NOTE — Patient Instructions (Signed)

## 2018-08-29 DIAGNOSIS — Z1231 Encounter for screening mammogram for malignant neoplasm of breast: Secondary | ICD-10-CM | POA: Diagnosis not present

## 2018-08-29 DIAGNOSIS — L900 Lichen sclerosus et atrophicus: Secondary | ICD-10-CM | POA: Diagnosis not present

## 2018-09-04 DIAGNOSIS — Z1212 Encounter for screening for malignant neoplasm of rectum: Secondary | ICD-10-CM | POA: Diagnosis not present

## 2018-09-09 ENCOUNTER — Encounter: Payer: Self-pay | Admitting: Cardiology

## 2018-09-09 ENCOUNTER — Ambulatory Visit: Payer: Medicare HMO | Admitting: Cardiology

## 2018-09-09 VITALS — BP 120/60 | HR 58 | Ht 62.5 in | Wt 214.0 lb

## 2018-09-09 DIAGNOSIS — Z79899 Other long term (current) drug therapy: Secondary | ICD-10-CM | POA: Diagnosis not present

## 2018-09-09 DIAGNOSIS — E785 Hyperlipidemia, unspecified: Secondary | ICD-10-CM | POA: Diagnosis not present

## 2018-09-09 DIAGNOSIS — I480 Paroxysmal atrial fibrillation: Secondary | ICD-10-CM

## 2018-09-09 DIAGNOSIS — G4733 Obstructive sleep apnea (adult) (pediatric): Secondary | ICD-10-CM | POA: Diagnosis not present

## 2018-09-09 NOTE — Patient Instructions (Addendum)
Medication Instructions:  The current medical regimen is effective;  continue present plan and medications.  If you need a refill on your cardiac medications before your next appointment, please call your pharmacy.   You are being referred to electrophysiology for the evaluation of Atrial Fibrillation.  Follow-Up: At [facility], you and your health needs are our priority.  As part of our continuing mission to provide you with exceptional heart care, we have created designated Provider Care Teams.  These Care Teams include your primary Cardiologist (physician) and Advanced Practice Providers (APPs -  Physician Assistants and Nurse Practitioners) who all work together to provide you with the care you need, when you need it. You will need a follow up appointment in 6 months.  Please call our office 2 months in advance to schedule this appointment.  You may see Dr Marlou Porch  or one of the following Advanced Practice Providers on your designated Care Team:   Truitt Merle, NP Cecilie Kicks, NP . Kathyrn Drown, NP  Thank you for choosing [facility]!!

## 2018-09-09 NOTE — Progress Notes (Signed)
Cardiology Office Note:    Date:  09/09/2018   ID:  Erika Kim, DOB May 10, 1946, MRN [id number]  PCP:  [name], MD  Cardiologist:  [name], MD  Electrophysiologist:  None   Referring MD: [name], MD     History of Present Illness:    Erika Kim is a 72 y.o. female here for follow-up of atrial fibrillation.  Back at last visit we discussed the diagnosis of endometrial cancer robotic hysterectomy in 2018.  Xarelto.  Minimal bleeding post surgery.  Occasional episodes of atrial fibrillation symptomatic.  These seem to have increased despite starting flecainide which she does not like taking.  States that she feels worse on this medication.  She knows to take extra Toprol if needed.  No syncope bleeding orthopnea PND.  Also having some dizziness which she thinks may be because of blood pressure.  See below.  Both grandparents had strokes.  Father had electrical issues with his heart.  She has diabetes hypertension female age greater than 44.  Chads vas score 4  Past Medical History:  Diagnosis Date  . Acoustic neuroma ([place]) followed by dr brown at [facility]   right side w/ chronic roaring noise  . Anticoagulant long-term use    xarelto  . Benign paroxysmal positional vertigo   . Endometrial polyp   . Frequency of urination   . Hyperlipidemia   . Hypertension   . OA (osteoarthritis)    left knee  . OSA on CPAP   . PAF (paroxysmal atrial fibrillation) [facility]) cardiologist-  dr Marlou Porch   dx 04/ 2017  . PMB (postmenopausal bleeding)   . TMJ (temporomandibular joint disorder)    left side-- wears guard  . Type 2 diabetes mellitus ([place])   . Wears contact lenses     Past Surgical History:  Procedure Laterality Date  . CARPAL TUNNEL RELEASE Right 04/17/2007   w/ Excision ganglion cyst and Pulley Release right thumb  . DILATATION & CURETTAGE/HYSTEROSCOPY WITH MYOSURE N/A 01/12/2017   Procedure: DILATATION & CURETTAGE/HYSTEROSCOPY WITH MYOSURE;  Surgeon:  Arvella Nigh, MD;  Location: [facility];  Service: Gynecology;  Laterality: N/A;  . HAMMER TOE SURGERY Left 2010   left second toe  . KNEE ARTHROSCOPY Right 2010  . KNEE SURGERY Left 1993  . LYMPH NODE BIOPSY N/A 03/01/2017   Procedure: SENTINEL LYMPH NODE BIOPSY;  Surgeon: Everitt Amber, MD;  Location: [facility];  Service: Gynecology;  Laterality: N/A;  . NASAL SINUS SURGERY  1985 and 1995  . ROBOTIC ASSISTED TOTAL HYSTERECTOMY WITH BILATERAL SALPINGO OOPHERECTOMY Bilateral 03/01/2017   Procedure: XI ROBOTIC ASSISTED TOTAL HYSTERECTOMY WITH BILATERAL SALPINGO OOPHORECTOMY;  Surgeon: Everitt Amber, MD;  [facility];  Service: Gynecology;  Laterality: Bilateral;  . ROTATOR CUFF REPAIR Right 04/2004  . TOE SURGERY Right 2012   Great toe and second toe  . TOTAL ELBOW REPLACEMENT Right 2000  . TOTAL KNEE ARTHROPLASTY Right 01/12/2010  . TRANSTHORACIC ECHOCARDIOGRAM  03/28/2016   ef 60-65%/ mild AR and MR  . TUBAL LIGATION Bilateral 1978    Current Medications: Current Meds  Medication Sig  . acetaminophen (TYLENOL) 500 MG tablet Take 500-1,000 mg by mouth as needed for mild pain, moderate pain, fever or headache.   Marland Kitchen atorvastatin (LIPITOR) 40 MG tablet Take 1 tablet by mouth every morning.   . Cholecalciferol (VITAMIN D3) 5000 units TABS Take 1 tablet by mouth daily.  . flecainide (TAMBOCOR) 50 MG tablet Take 1 tablet (50 mg total)  by mouth 2 (two) times daily.  Marland Kitchen ipratropium (ATROVENT) 0.03 % nasal spray Place 1-2 sprays into the nose at bedtime as needed for rhinitis.   Marland Kitchen JANUVIA 100 MG tablet Take 1 tablet by mouth daily after supper.  Marland Kitchen lisinopril-hydrochlorothiazide (PRINZIDE,ZESTORETIC) 20-12.5 MG per tablet Take 1 tablet by mouth every morning.   . metoprolol succinate (TOPROL-XL) 50 MG 24 hr tablet Take 1 tablet by mouth See admin instructions. Takes twice daily, may take a third time if heart goes into a-fib  . Multiple Vitamin (MULTIVITAMIN) tablet Take 1 tablet by  mouth daily.  Vladimir Faster Glycol-Propyl Glycol (SYSTANE OP) Place 1 drop into both eyes daily as needed (dry eyes).  . saline (AYR) GEL Place 1 application into the nose at bedtime.  Alveda Reasons 20 MG TABS tablet TAKE ONE TABLET BY MOUTH ONE TIME DAILY WITH SUPPER     Allergies:   Cephalosporins and Augmentin [amoxicillin-pot clavulanate]   Social History   Socioeconomic History  . Marital status: Married    Spouse name: Not on file  . Number of children: Not on file  . Years of education: Not on file  . Highest education level: Not on file  Occupational History  . Not on file  Social Needs  . Financial resource strain: Not on file  . Food insecurity:    Worry: Not on file    Inability: Not on file  . Transportation needs:    Medical: Not on file    Non-medical: Not on file  Tobacco Use  . Smoking status: Never Smoker  . Smokeless tobacco: Never Used  Substance and Sexual Activity  . Alcohol use: No  . Drug use: No  . Sexual activity: Not on file  Lifestyle  . Physical activity:    Days per week: Not on file    Minutes per session: Not on file  . Stress: Not on file  Relationships  . Social connections:    Talks on phone: Not on file    Gets together: Not on file    Attends religious service: Not on file    Active member of club or organization: Not on file    Attends meetings of clubs or organizations: Not on file    Relationship status: Not on file  Other Topics Concern  . Not on file  Social History Narrative   Married w/2 children (boy and girl).  Retired.     Family History: The patient's family history includes Heart failure in her father and mother.  ROS:   Please see the history of present illness.     All other systems reviewed and are negative.  EKGs/Labs/Other Studies Reviewed:    The following studies were reviewed today: Prior office notes EKG lab work  EKG:  EKG is not ordered today.    Recent Labs: No results found for requested labs  within last 8760 hours.  Recent Lipid Panel No results found for: CHOL, TRIG, HDL, CHOLHDL, VLDL, LDLCALC, LDLDIRECT  Physical Exam:    VS:  BP 120/60   Pulse (!) 58   Ht 5' 2.5" (1.588 m)   Wt 214 lb (97.1 kg)   SpO2 99%   BMI 38.52 kg/m     Wt Readings from Last 3 Encounters:  09/09/18 214 lb (97.1 kg)  08/27/18 214 lb 9.6 oz (97.3 kg)  06/07/18 215 lb (97.5 kg)     GEN:  Well nourished, well developed in no acute distress HEENT: Normal NECK: No JVD;  No carotid bruits LYMPHATICS: No lymphadenopathy CARDIAC: RRR, no murmurs, rubs, gallops RESPIRATORY:  Clear to auscultation without rales, wheezing or rhonchi  ABDOMEN: Soft, non-tender, non-distended MUSCULOSKELETAL:  No edema; No deformity  SKIN: Warm and dry NEUROLOGIC:  Alert and oriented x 3 PSYCHIATRIC:  Normal affect   ASSESSMENT:    1. Paroxysmal atrial fibrillation (HCC)   2. Hyperlipidemia, unspecified hyperlipidemia type   3. Obstructive sleep apnea   4. High risk medication use   5. Morbid obesity ([place])    PLAN:    In order of problems listed above:  Paroxysmal atrial fibrillation - Xarelto for anticoagulation, score of 4.  Brief episodes usually lasting about 2 hours. Extra Toprol sometimes help.  Fitbit. I started her on flecanide 50 mg twice a day back on 06/07/2018.  Started after a trip from [place].  Treadmill test okay.  Even with this medication, she is still feeling more episodes of atrial fibrillation.  She does not like the way that the flecainide has been making her feel she states.  Sometimes feels some pounding-like sensation in her ears, washing machine like. -We will refer her to EP for consideration of ablation or other antiarrhythmics perhaps.  Obstructive sleep apnea -CPAP.  Essential hypertension - Multidrug regimen reviewed.  Overall very well controlled.  At times her diastolic number is quite low.  Sometimes when bending over and getting up she feels a little bit dizzy.  She  will be talking with Dr. Sharlett Iles about her blood pressure.  At one point on losartan only her blood pressure was quite high.  He has adjusted her medications and has her under excellent control currently.  Hyperlipidemia - LDL 83.  Excellent.  Atorvastatin.  Diabetes - Januvia.  [place].  Hemoglobin A1c 5.3.  Excellent.  Morbid obesity - 2 risk factors with BMI greater than 35.  Continue to encourage weight loss.  Enjoy travels.  [place] in the past.,  She is going to [place] soon.  Medication Adjustments/Labs and Tests Ordered: Current medicines are reviewed at length with the patient today.  Concerns regarding medicines are outlined above.  Orders Placed This Encounter  Procedures  . Ambulatory referral to Cardiac Electrophysiology   No orders of the defined types were placed in this encounter.   Patient Instructions  Medication Instructions:  The current medical regimen is effective;  continue present plan and medications.  If you need a refill on your cardiac medications before your next appointment, please call your pharmacy.   You are being referred to electrophysiology for the evaluation of Atrial Fibrillation.  Follow-Up: At [facility], you and your health needs are our priority.  As part of our continuing mission to provide you with exceptional heart care, we have created designated Provider Care Teams.  These Care Teams include your primary Cardiologist (physician) and Advanced Practice Providers (APPs -  Physician Assistants and Nurse Practitioners) who all work together to provide you with the care you need, when you need it. You will need a follow up appointment in 6 months.  Please call our office 2 months in advance to schedule this appointment.  You may see Dr Marlou Porch  or one of the following Advanced Practice Providers on your designated Care Team:   Truitt Merle, NP Cecilie Kicks, NP . Kathyrn Drown, NP  Thank you for choosing [facility]!!        Signed, [name], MD  09/09/2018 8:28 AM    [place]

## 2018-09-15 ENCOUNTER — Encounter (HOSPITAL_COMMUNITY): Payer: Self-pay

## 2018-09-15 ENCOUNTER — Other Ambulatory Visit: Payer: Self-pay

## 2018-09-15 ENCOUNTER — Inpatient Hospital Stay (HOSPITAL_COMMUNITY)
Admission: EM | Admit: 2018-09-15 | Discharge: 2018-09-19 | DRG: 378 | Disposition: A | Discharge status: Home / Self Care | Payer: Medicare HMO | Attending: Internal Medicine | Admitting: Internal Medicine

## 2018-09-15 ENCOUNTER — Telehealth: Payer: Self-pay | Admitting: Physician Assistant

## 2018-09-15 DIAGNOSIS — K922 Gastrointestinal hemorrhage, unspecified: Secondary | ICD-10-CM

## 2018-09-15 DIAGNOSIS — R531 Weakness: Secondary | ICD-10-CM | POA: Diagnosis not present

## 2018-09-15 DIAGNOSIS — D333 Benign neoplasm of cranial nerves: Secondary | ICD-10-CM | POA: Diagnosis present

## 2018-09-15 DIAGNOSIS — D62 Acute posthemorrhagic anemia: Secondary | ICD-10-CM | POA: Diagnosis present

## 2018-09-15 DIAGNOSIS — I480 Paroxysmal atrial fibrillation: Secondary | ICD-10-CM | POA: Diagnosis present

## 2018-09-15 DIAGNOSIS — M1712 Unilateral primary osteoarthritis, left knee: Secondary | ICD-10-CM | POA: Diagnosis present

## 2018-09-15 DIAGNOSIS — G4733 Obstructive sleep apnea (adult) (pediatric): Secondary | ICD-10-CM | POA: Diagnosis not present

## 2018-09-15 DIAGNOSIS — D649 Anemia, unspecified: Secondary | ICD-10-CM | POA: Diagnosis not present

## 2018-09-15 DIAGNOSIS — Z6835 Body mass index (BMI) 35.0-35.9, adult: Secondary | ICD-10-CM

## 2018-09-15 DIAGNOSIS — I10 Essential (primary) hypertension: Secondary | ICD-10-CM | POA: Diagnosis present

## 2018-09-15 DIAGNOSIS — Z8249 Family history of ischemic heart disease and other diseases of the circulatory system: Secondary | ICD-10-CM

## 2018-09-15 DIAGNOSIS — E669 Obesity, unspecified: Secondary | ICD-10-CM | POA: Diagnosis present

## 2018-09-15 DIAGNOSIS — K5521 Angiodysplasia of colon with hemorrhage: Secondary | ICD-10-CM | POA: Diagnosis not present

## 2018-09-15 DIAGNOSIS — Z7901 Long term (current) use of anticoagulants: Secondary | ICD-10-CM

## 2018-09-15 DIAGNOSIS — K921 Melena: Secondary | ICD-10-CM | POA: Diagnosis not present

## 2018-09-15 DIAGNOSIS — Z888 Allergy status to other drugs, medicaments and biological substances status: Secondary | ICD-10-CM

## 2018-09-15 DIAGNOSIS — Z79899 Other long term (current) drug therapy: Secondary | ICD-10-CM

## 2018-09-15 DIAGNOSIS — E785 Hyperlipidemia, unspecified: Secondary | ICD-10-CM | POA: Diagnosis present

## 2018-09-15 DIAGNOSIS — Z90710 Acquired absence of both cervix and uterus: Secondary | ICD-10-CM

## 2018-09-15 DIAGNOSIS — E119 Type 2 diabetes mellitus without complications: Secondary | ICD-10-CM | POA: Diagnosis present

## 2018-09-15 DIAGNOSIS — Z880 Allergy status to penicillin: Secondary | ICD-10-CM

## 2018-09-15 DIAGNOSIS — Z96651 Presence of right artificial knee joint: Secondary | ICD-10-CM | POA: Diagnosis present

## 2018-09-15 LAB — CBC
HEMATOCRIT: 20.6 % — AB (ref 36.0–46.0)
Hemoglobin: 6 g/dL — CL (ref 12.0–15.0)
MCH: 28.3 pg (ref 26.0–34.0)
MCHC: 29.1 g/dL — ABNORMAL LOW (ref 30.0–36.0)
MCV: 97.2 fL (ref 80.0–100.0)
NRBC: 1 % — AB (ref 0.0–0.2)
Platelets: 161 10*3/uL (ref 150–400)
RBC: 2.12 MIL/uL — ABNORMAL LOW (ref 3.87–5.11)
RDW: 16.2 % — ABNORMAL HIGH (ref 11.5–15.5)
WBC: 6.7 10*3/uL (ref 4.0–10.5)

## 2018-09-15 LAB — COMPREHENSIVE METABOLIC PANEL
ALT: 24 U/L (ref 0–44)
ANION GAP: 10 (ref 5–15)
AST: 25 U/L (ref 15–41)
Albumin: 3.5 g/dL (ref 3.5–5.0)
Alkaline Phosphatase: 38 U/L (ref 38–126)
BUN: 18 mg/dL (ref 8–23)
CHLORIDE: 101 mmol/L (ref 98–111)
CO2: 23 mmol/L (ref 22–32)
Calcium: 9.4 mg/dL (ref 8.9–10.3)
Creatinine, Ser: 0.64 mg/dL (ref 0.44–1.00)
Glucose, Bld: 149 mg/dL — ABNORMAL HIGH (ref 70–99)
Potassium: 3.7 mmol/L (ref 3.5–5.1)
SODIUM: 134 mmol/L — AB (ref 135–145)
Total Bilirubin: 1.1 mg/dL (ref 0.3–1.2)
Total Protein: 5.9 g/dL — ABNORMAL LOW (ref 6.5–8.1)

## 2018-09-15 LAB — PREPARE RBC (CROSSMATCH)

## 2018-09-15 LAB — GLUCOSE, CAPILLARY: Glucose-Capillary: 100 mg/dL — ABNORMAL HIGH (ref 70–99)

## 2018-09-15 MED ORDER — ATORVASTATIN CALCIUM 40 MG PO TABS
40.0000 mg | ORAL_TABLET | Freq: Every morning | ORAL | Status: DC
  Administered 2018-09-16 – 2018-09-19 (×4): 40 mg via ORAL
  Filled 2018-09-15 (×4): qty 1

## 2018-09-15 MED ORDER — SODIUM CHLORIDE 0.9% IV SOLUTION: Freq: Once | INTRAVENOUS | Status: DC

## 2018-09-15 MED ORDER — METOPROLOL TARTRATE 50 MG PO TABS
50.0000 mg | ORAL_TABLET | Freq: Two times a day (BID) | ORAL | Status: DC
  Administered 2018-09-15 – 2018-09-19 (×7): 50 mg via ORAL
  Filled 2018-09-15 (×8): qty 1

## 2018-09-15 MED ORDER — INSULIN ASPART 100 UNIT/ML ~~LOC~~ SOLN: 0.0000 [IU] | Freq: Every day | SUBCUTANEOUS | Status: DC

## 2018-09-15 MED ORDER — FLECAINIDE ACETATE 50 MG PO TABS
50.0000 mg | ORAL_TABLET | Freq: Two times a day (BID) | ORAL | Status: DC
  Administered 2018-09-15 – 2018-09-19 (×8): 50 mg via ORAL
  Filled 2018-09-15 (×8): qty 1

## 2018-09-15 MED ORDER — SODIUM CHLORIDE 0.9 % IV SOLN: 10.0000 mL/h | Freq: Once | INTRAVENOUS | Status: DC

## 2018-09-15 MED ORDER — PANTOPRAZOLE SODIUM 40 MG IV SOLR
40.0000 mg | Freq: Two times a day (BID) | INTRAVENOUS | Status: DC
  Administered 2018-09-15 – 2018-09-19 (×8): 40 mg via INTRAVENOUS
  Filled 2018-09-15 (×8): qty 40

## 2018-09-15 MED ORDER — INSULIN ASPART 100 UNIT/ML ~~LOC~~ SOLN
0.0000 [IU] | Freq: Three times a day (TID) | SUBCUTANEOUS | Status: DC
  Administered 2018-09-16: 2 [IU] via SUBCUTANEOUS
  Administered 2018-09-17: 1 [IU] via SUBCUTANEOUS

## 2018-09-15 NOTE — Telephone Encounter (Signed)
Paged by answering service.  Patient was seen by Dr. Marlou Porch 11/18 for paroxysmal atrial fibrillation.  Reviewed office visit note.  No change in symptoms.  She continued to feel pounding sensation in her ears, fatigue and intermittent shortness of breath.  Her blood pressure is 115/49 at heart rate of 62. Continue to take metoprolol and flecainide at current dose.  She will cut back her lisinopril/80 cc to half a dose.  She has appointment with Dr. Curt Bears 12/3 for antiarrhythmic versus ablation evaluation.  She will come to ER if worsening of symptoms or no improvement.

## 2018-09-15 NOTE — ED Provider Notes (Signed)
[facility] EMERGENCY DEPARTMENT Provider Note   CSN: [id number] Arrival date & time: 09/15/18  1339     History   Chief Complaint Chief Complaint  Patient presents with  . Weakness    HPI Erika Kim is a 72 y.o. female.  HPI Patient with history of paroxysmal atrial fibrillation.  She reports she takes Xarelto.  For about a month now she has been getting gradually more fatigued and weaker.  She reports over the past week she has noted that she is visibly pale and gets short of breath with walking short distance.  She reports if she bends over or stoops and stands back up she feels like she will pass out.  She has not had any syncope.  There is been no pain associated with this.  No chest pain.  No fever no productive cough.  She reports her stool has been dark in appearance.  She reports she had a colonoscopy within the past year that did not show any abnormalities. Past Medical History:  Diagnosis Date  . Acoustic neuroma ([place]) followed by dr brown at [facility]   right side w/ chronic roaring noise  . Anticoagulant long-term use    xarelto  . Benign paroxysmal positional vertigo   . Endometrial polyp   . Frequency of urination   . Hyperlipidemia   . Hypertension   . OA (osteoarthritis)    left knee  . OSA on CPAP   . PAF (paroxysmal atrial fibrillation) [facility]) cardiologist-  dr Marlou Porch   dx 04/ 2017  . PMB (postmenopausal bleeding)   . TMJ (temporomandibular joint disorder)    left side-- wears guard  . Type 2 diabetes mellitus ([place])   . Wears contact lenses     Patient Active Problem List   Diagnosis Date Noted  . GI bleed 09/15/2018  . PD (perceptive deafness), asymmetrical 02/12/2017  . Diabetes ([place]) 02/12/2017  . Endometrial cancer ([place]) 02/12/2017  . Endometrial polyp 01/12/2017    Class: Present on Admission  . Paroxysmal atrial fibrillation ([place]) 03/13/2016  . Snoring 08/08/2013  . Obesity 08/08/2013  . Adiposity 08/08/2013  .  Right acoustic neuroma ([place]) 04/29/2012    Past Surgical History:  Procedure Laterality Date  . CARPAL TUNNEL RELEASE Right 04/17/2007   w/ Excision ganglion cyst and Pulley Release right thumb  . DILATATION & CURETTAGE/HYSTEROSCOPY WITH MYOSURE N/A 01/12/2017   Procedure: DILATATION & CURETTAGE/HYSTEROSCOPY WITH MYOSURE;  Surgeon: Arvella Nigh, MD;  Location: [facility];  Service: Gynecology;  Laterality: N/A;  . HAMMER TOE SURGERY Left 2010   left second toe  . KNEE ARTHROSCOPY Right 2010  . KNEE SURGERY Left 1993  . LYMPH NODE BIOPSY N/A 03/01/2017   Procedure: SENTINEL LYMPH NODE BIOPSY;  Surgeon: Everitt Amber, MD;  [facility];  Service: Gynecology;  Laterality: N/A;  . NASAL SINUS SURGERY  1985 and 1995  . ROBOTIC ASSISTED TOTAL HYSTERECTOMY WITH BILATERAL SALPINGO OOPHERECTOMY Bilateral 03/01/2017   Procedure: XI ROBOTIC ASSISTED TOTAL HYSTERECTOMY WITH BILATERAL SALPINGO OOPHORECTOMY;  Surgeon: Everitt Amber, MD;  [facility];  Service: Gynecology;  Laterality: Bilateral;  . ROTATOR CUFF REPAIR Right 04/2004  . TOE SURGERY Right 2012   Great toe and second toe  . TOTAL ELBOW REPLACEMENT Right 2000  . TOTAL KNEE ARTHROPLASTY Right 01/12/2010  . TRANSTHORACIC ECHOCARDIOGRAM  03/28/2016   ef 60-65%/ mild AR and MR  . TUBAL LIGATION Bilateral 1978     OB History   None  Home Medications    Prior to Admission medications   Medication Sig Start Date End Date Taking? Authorizing Provider  atorvastatin (LIPITOR) 40 MG tablet Take 40 mg by mouth every morning.  08/07/13  Yes [provider]  Cholecalciferol (VITAMIN D3) 5000 units TABS Take 5,000 Units by mouth daily.    Yes [provider]  flecainide (TAMBOCOR) 50 MG tablet Take 1 tablet (50 mg total) by mouth 2 (two) times daily. 06/07/18  Yes Jerline Pain, MD  ipratropium (ATROVENT) 0.03 % nasal spray Place 1-2 sprays into the nose at bedtime as needed for rhinitis.    Yes  [provider]  JANUVIA 100 MG tablet Take 100 mg by mouth daily after supper.  04/01/14  Yes [provider]  lisinopril-hydrochlorothiazide (PRINZIDE,ZESTORETIC) 20-12.5 MG per tablet Take 1 tablet by mouth every morning.  08/04/13  Yes [provider]  metoprolol succinate (TOPROL-XL) 50 MG 24 hr tablet Take 50 mg by mouth 2 (two) times daily. May take a third time if heart goes into a-fib 06/18/13  Yes [provider]  Multiple Vitamin (MULTIVITAMIN) tablet Take 1 tablet by mouth daily.   Yes [provider]  Polyethyl Glycol-Propyl Glycol (SYSTANE OP) Place 1 drop into both eyes daily as needed (dry eyes).   Yes [provider]  saline (AYR) GEL Place 1 application into the nose at bedtime.   Yes [provider]  XARELTO 20 MG TABS tablet TAKE ONE TABLET BY MOUTH ONE TIME DAILY WITH SUPPER Patient taking differently: Take 20 mg by mouth daily with supper.  01/28/18  Yes Jerline Pain, MD    Family History Family History  Problem Relation Age of Onset  . Heart failure Father   . Heart failure Mother     Social History Social History   Tobacco Use  . Smoking status: Never Smoker  . Smokeless tobacco: Never Used  Substance Use Topics  . Alcohol use: No  . Drug use: No     Allergies   Cephalosporins and Augmentin [amoxicillin-pot clavulanate]   Review of Systems Review of Systems 10 Systems reviewed and are negative for acute change except as noted in the HPI.   Physical Exam Updated Vital Signs BP (!) 114/40   Pulse 68   Temp 97.8 F (36.6 C) (Oral)   Resp 15   Ht 5\' 3"  (1.6 m)   Wt 90.7 kg   SpO2 100%   BMI 35.43 kg/m   Physical Exam  Constitutional: She is oriented to person, place, and time.  Patient is alert and appropriate.  No respiratory distress slightly pale in appearance.  HENT:  Head: Normocephalic and atraumatic.  Mouth/Throat: Oropharynx is clear and moist.  Eyes: EOM are normal.    Neck: Neck supple.  Cardiovascular: Normal rate, regular rhythm, normal heart sounds and intact distal pulses.  Pulmonary/Chest: Effort normal and breath sounds normal.  Abdominal: Soft. She exhibits no distension and no mass. There is no tenderness. There is no guarding.  Musculoskeletal: Normal range of motion. She exhibits no edema or tenderness.  Neurological: She is alert and oriented to person, place, and time. She exhibits normal muscle tone. Coordination normal.  Skin: Skin is warm and dry. There is pallor.  Psychiatric: She has a normal mood and affect.     ED Treatments / Results  Labs (all labs ordered are listed, but only abnormal results are displayed) Labs Reviewed  COMPREHENSIVE METABOLIC PANEL - Abnormal; Notable for the following components:  Result Value   Sodium 134 (*)    Glucose, Bld 149 (*)    Total Protein 5.9 (*)    All other components within normal limits  CBC - Abnormal; Notable for the following components:   RBC 2.12 (*)    Hemoglobin 6.0 (*)    HCT 20.6 (*)    MCHC 29.1 (*)    RDW 16.2 (*)    nRBC 1.0 (*)    All other components within normal limits  POC OCCULT BLOOD, ED  POC OCCULT BLOOD, ED  TYPE AND SCREEN  PREPARE RBC (CROSSMATCH)    EKG EKG Interpretation  Date/Time:  Sunday September 15 2018 13:53:27 EST Ventricular Rate:  99 PR Interval:    QRS Duration: 102 QT Interval:  403 QTC Calculation: 512 R Axis:   6 Text Interpretation:  Atrial fibrillation Borderline repolarization abnormality Prolonged QT interval agree. no STEMI, except for rythm, no change from previous Confirmed by Charlesetta Shanks [id number]) on 09/15/2018 2:03:12 PM   Radiology No results found.  Procedures Procedures (including critical care time) CRITICAL CARE Performed by: Charlesetta Shanks   Total critical care time: 30  minutes  Critical care time was exclusive of separately billable procedures and treating other patients.  Critical care was necessary  to treat or prevent imminent or life-threatening deterioration.  Critical care was time spent personally by me on the following activities: development of treatment plan with patient and/or surrogate as well as nursing, discussions with consultants, evaluation of patient's response to treatment, examination of patient, obtaining history from patient or surrogate, ordering and performing treatments and interventions, ordering and review of laboratory studies, ordering and review of radiographic studies, pulse oximetry and re-evaluation of patient's condition. Medications Ordered in ED Medications  0.9 %  sodium chloride infusion (has no administration in time range)  0.9 %  sodium chloride infusion (Manually program via Guardrails IV Fluids) (has no administration in time range)     Initial Impression / Assessment and Plan / ED Course  I have reviewed the triage vital signs and the nursing notes.  Pertinent labs & imaging results that were available during my care of the patient were reviewed by me and considered in my medical decision making (see chart for details).  Clinical Course as of Sep 16 1647  Nancy Fetter Sep 15, 2018  1505 Consult: Dr.Gerghe accepts for admission. Requests GI consult.   [MP]  1532 Consult: Dr. Cristina Gong has returned call for GI consult.   [MP]    Clinical Course User Index [MP] Charlesetta Shanks, MD   Patient presents aligned above.  She has significant anemia that is new for her.  Likely etiology is gastrointestinal.  Patient is not having any chest pain.  She is short of breath with exertion only.  She has not had any syncopal episodes.  She is stable at rest.  Will initiate orders for blood transfusion for symptomatic anemia.  Plan for admission.  Final Clinical Impressions(s) / ED Diagnoses   Final diagnoses:  Symptomatic anemia    ED Discharge Orders    None       Charlesetta Shanks, MD 09/15/18 1650

## 2018-09-15 NOTE — ED Triage Notes (Signed)
Pt reports generalized weakness for the past week along with hyperglycemia (153 this morning). Pt denies any other symptoms. Hx of a fib. Pt a/o upon arrival. Nad noted.

## 2018-09-15 NOTE — H&P (Signed)
History and Physical    Erika Kim GNF:[id number] DOB: 28-Apr-1946 DOA: 09/15/2018  I have briefly reviewed the patient's prior medical records in [facility]  PCP: [name], MD  Patient coming from: home  Chief Complaint: Generalized weakness  HPI: Erika Kim is a 72 y.o. female with medical history significant of paroxysmal A. fib on chronic Xarelto, hypertension, hyperlipidemia, OSA, type 2 diabetes mellitus, presents to the hospital with chief complaint of generalized weakness.  She is noticed over the last 6 days that she is been getting progressively weaker and weaker, getting short of breath and dizzy every time she tries to walk.  She is also been complaining of dark stools over the same period of time.  She denies any frank blood in her stool.  She denies any chest pain, palpitations, denies any shortness of breath.  She denies any abdominal pain, nausea or vomiting.  She has had no fever or chills.  ED Course: In the emergency room her vital signs are stable, she is normotensive and afebrile.  Blood work reveals a hemoglobin of 6.0, prior values about 6 months ago were in the 13 range.  We are asked to admit for possible GI bleed  Review of Systems: As per HPI otherwise 10 point review of systems negative.   Past Medical History:  Diagnosis Date  . Acoustic neuroma ([place]) followed by dr brown at [facility]   right side w/ chronic roaring noise  . Anticoagulant long-term use    xarelto  . Benign paroxysmal positional vertigo   . Endometrial polyp   . Frequency of urination   . Hyperlipidemia   . Hypertension   . OA (osteoarthritis)    left knee  . OSA on CPAP   . PAF (paroxysmal atrial fibrillation) [facility]) cardiologist-  dr Marlou Porch   dx 04/ 2017  . PMB (postmenopausal bleeding)   . TMJ (temporomandibular joint disorder)    left side-- wears guard  . Type 2 diabetes mellitus ([place])   . Wears contact lenses     Past Surgical History:  Procedure  Laterality Date  . CARPAL TUNNEL RELEASE Right 04/17/2007   w/ Excision ganglion cyst and Pulley Release right thumb  . DILATATION & CURETTAGE/HYSTEROSCOPY WITH MYOSURE N/A 01/12/2017   Procedure: DILATATION & CURETTAGE/HYSTEROSCOPY WITH MYOSURE;  Surgeon: Arvella Nigh, MD;  Location: [place];  Service: Gynecology;  Laterality: N/A;  . HAMMER TOE SURGERY Left 2010   left second toe  . KNEE ARTHROSCOPY Right 2010  . KNEE SURGERY Left 1993  . LYMPH NODE BIOPSY N/A 03/01/2017   Procedure: SENTINEL LYMPH NODE BIOPSY;  Surgeon: Everitt Amber, MD;  Location: [facility];  Service: Gynecology;  Laterality: N/A;  . NASAL SINUS SURGERY  1985 and 1995  . ROBOTIC ASSISTED TOTAL HYSTERECTOMY WITH BILATERAL SALPINGO OOPHERECTOMY Bilateral 03/01/2017   Procedure: XI ROBOTIC ASSISTED TOTAL HYSTERECTOMY WITH BILATERAL SALPINGO OOPHORECTOMY;  Surgeon: Everitt Amber, MD;  Location: [facility];  Service: Gynecology;  Laterality: Bilateral;  . ROTATOR CUFF REPAIR Right 04/2004  . TOE SURGERY Right 2012   Great toe and second toe  . TOTAL ELBOW REPLACEMENT Right 2000  . TOTAL KNEE ARTHROPLASTY Right 01/12/2010  . TRANSTHORACIC ECHOCARDIOGRAM  03/28/2016   ef 60-65%/ mild AR and MR  . TUBAL LIGATION Bilateral 1978     reports that she has never smoked. She has never used smokeless tobacco. She reports that she does not drink alcohol or use drugs.  Allergies  Allergen Reactions  .  Cephalosporins Itching    severe  . Augmentin [Amoxicillin-Pot Clavulanate] Itching    severe    Family History  Problem Relation Age of Onset  . Heart failure Father   . Heart failure Mother     Prior to Admission medications   Medication Sig Start Date End Date Taking? Authorizing Provider  atorvastatin (LIPITOR) 40 MG tablet Take 40 mg by mouth every morning.  08/07/13  Yes [provider]  Cholecalciferol (VITAMIN D3) 5000 units TABS Take 5,000 Units by mouth daily.    Yes [provider]    flecainide (TAMBOCOR) 50 MG tablet Take 1 tablet (50 mg total) by mouth 2 (two) times daily. 06/07/18  Yes Jerline Pain, MD  ipratropium (ATROVENT) 0.03 % nasal spray Place 1-2 sprays into the nose at bedtime as needed for rhinitis.    Yes [provider]  JANUVIA 100 MG tablet Take 100 mg by mouth daily after supper.  04/01/14  Yes [provider]  lisinopril-hydrochlorothiazide (PRINZIDE,ZESTORETIC) 20-12.5 MG per tablet Take 1 tablet by mouth every morning.  08/04/13  Yes [provider]  metoprolol succinate (TOPROL-XL) 50 MG 24 hr tablet Take 50 mg by mouth 2 (two) times daily. May take a third time if heart goes into a-fib 06/18/13  Yes [provider]  Multiple Vitamin (MULTIVITAMIN) tablet Take 1 tablet by mouth daily.   Yes [provider]  Polyethyl Glycol-Propyl Glycol (SYSTANE OP) Place 1 drop into both eyes daily as needed (dry eyes).   Yes [provider]  saline (AYR) GEL Place 1 application into the nose at bedtime.   Yes [provider]  XARELTO 20 MG TABS tablet TAKE ONE TABLET BY MOUTH ONE TIME DAILY WITH SUPPER Patient taking differently: Take 20 mg by mouth daily with supper.  01/28/18  Yes Jerline Pain, MD    Physical Exam: Vitals:   09/15/18 1615 09/15/18 1617 09/15/18 1622 09/15/18 1630  BP: (!) 112/48 (!) 112/48 (!) 104/45 (!) 114/40  Pulse: 71  70 68  Resp: 17 20 15 15   Temp:  98.4 F (36.9 C) 97.8 F (36.6 C)   TempSrc:  Oral Oral   SpO2: 100%  100% 100%  Weight:      Height:        Constitutional: NAD, calm, comfortable, pale appearing Eyes: PERRL, lids and conjunctivae normal ENMT: Mucous membranes are moist. Posterior pharynx clear of any exudate or lesions. Neck: normal, supple Respiratory: clear to auscultation bilaterally, no wheezing, no crackles. Normal respiratory effort. No accessory muscle use.  Cardiovascular: Irregular, no murmurs / rubs / gallops. No extremity edema. 2+ pedal  pulses.  Abdomen: no tenderness, no masses palpated. Bowel sounds positive.  Musculoskeletal: no clubbing / cyanosis. Normal muscle tone.  Skin: no rashes, lesions, ulcers. No induration Neurologic: CN 2-12 grossly intact. Strength 5/5 in all 4.  Psychiatric: Normal judgment and insight. Alert and oriented x 3. Normal mood.   Labs on Admission: I have personally reviewed following labs and imaging studies  CBC: Recent Labs  Lab 09/15/18 1419  WBC 6.7  HGB 6.0*  HCT 20.6*  MCV 97.2  PLT 809   Basic Metabolic Panel: Recent Labs  Lab 09/15/18 1419  NA 134*  K 3.7  CL 101  CO2 23  GLUCOSE 149*  BUN 18  CREATININE 0.64  CALCIUM 9.4   GFR: Estimated Creatinine Clearance: 69.9 mL/min (by C-G formula based on SCr of 0.64 mg/dL). Liver Function Tests: Recent Labs  Lab 09/15/18 1419  AST 25  ALT 24  ALKPHOS 38  BILITOT 1.1  PROT 5.9*  ALBUMIN 3.5   No results for input(s): LIPASE, AMYLASE in the last 168 hours. No results for input(s): AMMONIA in the last 168 hours. Coagulation Profile: No results for input(s): INR, PROTIME in the last 168 hours. Cardiac Enzymes: No results for input(s): CKTOTAL, CKMB, CKMBINDEX, TROPONINI in the last 168 hours. BNP (last 3 results) No results for input(s): PROBNP in the last 8760 hours. HbA1C: No results for input(s): HGBA1C in the last 72 hours. CBG: No results for input(s): GLUCAP in the last 168 hours. Lipid Profile: No results for input(s): CHOL, HDL, LDLCALC, TRIG, CHOLHDL, LDLDIRECT in the last 72 hours. Thyroid Function Tests: No results for input(s): TSH, T4TOTAL, FREET4, T3FREE, THYROIDAB in the last 72 hours. Anemia Panel: No results for input(s): VITAMINB12, FOLATE, FERRITIN, TIBC, IRON, RETICCTPCT in the last 72 hours. Urine analysis:    Component Value Date/Time   COLORURINE YELLOW 01/12/2010 [place] 01/12/2010 0939   LABSPEC 1.016 01/12/2010 0939   PHURINE 6.5 01/12/2010 0939   GLUCOSEU  NEGATIVE 01/12/2010 0939   HGBUR NEGATIVE 01/12/2010 0939   BILIRUBINUR NEGATIVE 01/12/2010 0939   KETONESUR NEGATIVE 01/12/2010 0939   PROTEINUR NEGATIVE 01/12/2010 0939   UROBILINOGEN 0.2 01/12/2010 0939   NITRITE NEGATIVE 01/12/2010 0939   LEUKOCYTESUR  01/12/2010 0939    NEGATIVE MICROSCOPIC NOT DONE ON URINES WITH NEGATIVE PROTEIN, BLOOD, LEUKOCYTES, NITRITE, OR GLUCOSE <1000 mg/dL.     Radiological Exams on Admission: No results found.  EKG: Independently reviewed.  A. fib  Assessment/Plan Active Problems:   GI bleed   Acute blood loss anemia/upper GI bleed -Hold patient's Xarelto, gastroenterology was consulted I have discussed with Dr. Cristina Gong -Transfuse 2 units of packed red blood cells, repeat CBC after transfusion -Placed on twice daily PPI  Paroxysmal A. fib -Currently in A. fib, continue flecainide, given concern for GI bleed going to hold lisinopril/hydrochlorothiazide/metoprolol, if blood pressure is stable can probably resume metoprolol first thing in the morning -Hold anticoagulation as above  Type 2 diabetes mellitus -Hold home Januvia, placed on sliding scale while here  Hyperlipidemia -Continue statin  OSA -Nightly CPAP   DVT prophylaxis: SCDs  Code Status: Full code  Family Communication: husband bedside Disposition Plan: home when ready  Consults called: GI    Marzetta Board, MD, PhD [facility] Pager [phone]  If 7PM-7AM, please contact night-coverage [email or web address] Password [facility]  09/15/2018, 4:45 PM

## 2018-09-15 NOTE — Consult Note (Addendum)
Referring Provider:   Dr. Wardell Heath Primary Care Physician:  [name], MD Primary Gastroenterologist:  Dr. Therisa Doyne  Reason for Consultation: Melena and anemia  HPI: Erika Kim is a 72 y.o. female on Xarelto for atrial fibrillation for the past several years, no prior history of GI bleeding or ulcer disease, has a several day history of dark stools with associated weakness and dyspnea and presented to the emergency room where her hemoglobin was noted to be 6.0 (MCV 97, platelets 161,000).  BUN was essentially normal at 18.    The patient has not had any prodromal dyspeptic symptomatology and denies use of aspirin or nonsteroidal anti-inflammatory drugs.  Ejection fraction by echo 2 years ago was approximately 60 to 65%.  Colonoscopy by Dr. Therisa Doyne last year showed 4 small adenomatous type polyps for which 3-year surveillance colonoscopy was recommended; no additional abnormalities were noted on that exam.   Past Medical History:  Diagnosis Date  . Acoustic neuroma ([place]) followed by dr brown at [facility]   right side w/ chronic roaring noise  . Anticoagulant long-term use    xarelto  . Benign paroxysmal positional vertigo   . Endometrial polyp   . Frequency of urination   . Hyperlipidemia   . Hypertension   . OA (osteoarthritis)    left knee  . OSA on CPAP   . PAF (paroxysmal atrial fibrillation) [facility]) cardiologist-  dr Marlou Porch   dx 04/ 2017  . PMB (postmenopausal bleeding)   . TMJ (temporomandibular joint disorder)    left side-- wears guard  . Type 2 diabetes mellitus ([place])   . Wears contact lenses     Past Surgical History:  Procedure Laterality Date  . CARPAL TUNNEL RELEASE Right 04/17/2007   w/ Excision ganglion cyst and Pulley Release right thumb  . DILATATION & CURETTAGE/HYSTEROSCOPY WITH MYOSURE N/A 01/12/2017   Procedure: DILATATION & CURETTAGE/HYSTEROSCOPY WITH MYOSURE;  Surgeon: Arvella Nigh, MD;  Location: [place];  Service: Gynecology;   Laterality: N/A;  . HAMMER TOE SURGERY Left 2010   left second toe  . KNEE ARTHROSCOPY Right 2010  . KNEE SURGERY Left 1993  . LYMPH NODE BIOPSY N/A 03/01/2017   Procedure: SENTINEL LYMPH NODE BIOPSY;  Surgeon: Everitt Amber, MD;  Location: [facility];  Service: Gynecology;  Laterality: N/A;  . NASAL SINUS SURGERY  1985 and 1995  . ROBOTIC ASSISTED TOTAL HYSTERECTOMY WITH BILATERAL SALPINGO OOPHERECTOMY Bilateral 03/01/2017   Procedure: XI ROBOTIC ASSISTED TOTAL HYSTERECTOMY WITH BILATERAL SALPINGO OOPHORECTOMY;  Surgeon: Everitt Amber, MD;  Location[facility];  Service: Gynecology;  Laterality: Bilateral;  . ROTATOR CUFF REPAIR Right 04/2004  . TOE SURGERY Right 2012   Great toe and second toe  . TOTAL ELBOW REPLACEMENT Right 2000  . TOTAL KNEE ARTHROPLASTY Right 01/12/2010  . TRANSTHORACIC ECHOCARDIOGRAM  03/28/2016   ef 60-65%/ mild AR and MR  . TUBAL LIGATION Bilateral 1978    Prior to Admission medications   Medication Sig Start Date End Date Taking? Authorizing Provider  atorvastatin (LIPITOR) 40 MG tablet Take 40 mg by mouth every morning.  08/07/13  Yes [provider]  Cholecalciferol (VITAMIN D3) 5000 units TABS Take 5,000 Units by mouth daily.    Yes [provider]  flecainide (TAMBOCOR) 50 MG tablet Take 1 tablet (50 mg total) by mouth 2 (two) times daily. 06/07/18  Yes Jerline Pain, MD  ipratropium (ATROVENT) 0.03 % nasal spray Place 1-2 sprays into the nose at bedtime as needed for  rhinitis.    Yes [provider]  JANUVIA 100 MG tablet Take 100 mg by mouth daily after supper.  04/01/14  Yes [provider]  lisinopril-hydrochlorothiazide (PRINZIDE,ZESTORETIC) 20-12.5 MG per tablet Take 1 tablet by mouth every morning.  08/04/13  Yes [provider]  metoprolol succinate (TOPROL-XL) 50 MG 24 hr tablet Take 50 mg by mouth 2 (two) times daily. May take a third time if heart goes into a-fib 06/18/13  Yes [provider]  Multiple  Vitamin (MULTIVITAMIN) tablet Take 1 tablet by mouth daily.   Yes [provider]  Polyethyl Glycol-Propyl Glycol (SYSTANE OP) Place 1 drop into both eyes daily as needed (dry eyes).   Yes [provider]  saline (AYR) GEL Place 1 application into the nose at bedtime.   Yes [provider]  XARELTO 20 MG TABS tablet TAKE ONE TABLET BY MOUTH ONE TIME DAILY WITH SUPPER Patient taking differently: Take 20 mg by mouth daily with supper.  01/28/18  Yes Jerline Pain, MD    Current Facility-Administered Medications  Medication Dose Route Frequency Provider Last Rate Last Dose  . 0.9 %  sodium chloride infusion (Manually program via Guardrails IV Fluids)   Intravenous Once Caren Griffins, MD      . 0.9 %  sodium chloride infusion  10 mL/hr Intravenous Once Charlesetta Shanks, MD      . Derrill Memo ON 09/16/2018] atorvastatin (LIPITOR) tablet 40 mg  40 mg Oral q morning - 10a Gherghe, Vella Redhead, MD      . flecainide (TAMBOCOR) tablet 50 mg  50 mg Oral BID Caren Griffins, MD      . insulin aspart (novoLOG) injection 0-5 Units  0-5 Units Subcutaneous QHS Caren Griffins, MD      . Derrill Memo ON 09/16/2018] insulin aspart (novoLOG) injection 0-9 Units  0-9 Units Subcutaneous TID WC Gherghe, Costin M, MD      . metoprolol tartrate (LOPRESSOR) tablet 50 mg  50 mg Oral BID Caren Griffins, MD      . pantoprazole (PROTONIX) injection 40 mg  40 mg Intravenous Q12H Caren Griffins, MD        Allergies as of 09/15/2018 - Review Complete 09/15/2018  Allergen Reaction Noted  . Cephalosporins Itching 01/09/2017  . Augmentin [amoxicillin-pot clavulanate] Itching 08/08/2013    Family History  Problem Relation Age of Onset  . Heart failure Father   . Heart failure Mother     Social History   Socioeconomic History  . Marital status: Married    Spouse name: Not on file  . Number of children: Not on file  . Years of education: Not on file  . Highest education level: Not on file   Occupational History  . Not on file  Social Needs  . Financial resource strain: Not on file  . Food insecurity:    Worry: Not on file    Inability: Not on file  . Transportation needs:    Medical: Not on file    Non-medical: Not on file  Tobacco Use  . Smoking status: Never Smoker  . Smokeless tobacco: Never Used  Substance and Sexual Activity  . Alcohol use: No  . Drug use: No  . Sexual activity: Not on file  Lifestyle  . Physical activity:    Days per week: Not on file    Minutes per session: Not on file  . Stress: Not on file  Relationships  . Social connections:  Talks on phone: Not on file    Gets together: Not on file    Attends religious service: Not on file    Active member of club or organization: Not on file    Attends meetings of clubs or organizations: Not on file    Relationship status: Not on file  . Intimate partner violence:    Fear of current or ex partner: Not on file    Emotionally abused: Not on file    Physically abused: Not on file    Forced sexual activity: Not on file  Other Topics Concern  . Not on file  Social History Narrative   Married w/2 children (boy and girl).  Retired.    Review of Systems: Rare dysphagia symptoms, no abdominal pain.  Has had slight tendency toward constipation, with schedule a stools, recently prompting her to use a laxative.  Physical Exam: Vital signs in last 24 hours: Temp:  [97.8 F (36.6 C)-98.4 F (36.9 C)] 98.3 F (36.8 C) (11/24 1750) Pulse Rate:  [65-107] 65 (11/24 1750) Resp:  [14-28] 20 (11/24 1750) BP: (104-127)/(33-63) 111/33 (11/24 1750) SpO2:  [99 %-100 %] 100 % (11/24 1750) Weight:  [90.7 kg] 90.7 kg (11/24 1350)   General:   Alert,  Well-developed, moderately overweight, well-nourished, pleasant and cooperative in absolutely NAD Head:  Normocephalic and atraumatic. Eyes:  Sclera clear, no icterus.   Conjunctiva pale. Mouth:   No ulcerations or lesions.  Oropharynx pink & moist. Neck:    No masses or thyromegaly. Lungs:  Clear throughout to auscultation.   No wheezes, crackles, or rhonchi. No evident respiratory distress. Heart:   Regular rate and rhythm; no obvious a. Fib at present, rate well-controlled, no murmurs, clicks, rubs,  or gallops. Abdomen:   Adipose, soft, nontender, and nondistended. No masses, hepatosplenomegaly or ventral hernias noted. Msk:   Symmetrical without gross deformities. Extremities:   Without clubbing, cyanosis, or edema. Neurologic:  Alert and coherent;  grossly normal neurologically. Skin:  Intact without significant lesions or rashes. Cervical Nodes:  No significant cervical adenopathy. Psych:   Alert and cooperative. Normal mood and affect.  Intake/Output from previous day: No intake/output data recorded. Intake/Output this shift: Total I/O In: 316.3 [I.V.:100; Blood:216.3] Out: -   Lab Results: Recent Labs    09/15/18 1419  WBC 6.7  HGB 6.0*  HCT 20.6*  PLT 161   BMET Recent Labs    09/15/18 1419  NA 134*  K 3.7  CL 101  CO2 23  GLUCOSE 149*  BUN 18  CREATININE 0.64  CALCIUM 9.4   LFT Recent Labs    09/15/18 1419  PROT 5.9*  ALBUMIN 3.5  AST 25  ALT 24  ALKPHOS 38  BILITOT 1.1   PT/INR No results for input(s): LABPROT, INR in the last 72 hours.  Studies/Results: No results found.  Impression: 1.  Subacute GI bleed of indeterminate origin, presumably upper tract source, although BUN is currently normal.  Perhaps the bleeding has tapered off. 2.  Posthemorrhagic anemia, acute 3.  History of adenomatous polyps 4.  Chronic anticoagulation with Xarelto for atrial fibrillation  Plan: Endoscopy tomorrow morning.  Petra Kuba, purpose, risks reviewed and patient agreeable.  If the endoscopy is negative, consider capsule endoscopy of the small bowel.  If that is negative, would monitor the patient for evidence of ongoing blood loss and, if present, would do updated colonoscopic evaluation although the yield of  that for a source of bleeding would be low with her having  had colonoscopy just a year ago.  I explained to the patient and her husband that frequently, in patients on anticoagulation, we do not find a discrete source of the bleeding.  In the meantime, agree with transfusion and PPI therapy.  The patient is clinically stable at present and there is no need for emergent endoscopic evaluation.   LOS: 0 days   Youlanda Mighty Algis Lehenbauer  09/15/2018, 8:37 PM   Pager [phone] If no answer or after 5 PM call [phone]

## 2018-09-16 ENCOUNTER — Observation Stay (HOSPITAL_COMMUNITY): Payer: Medicare HMO | Admitting: Certified Registered Nurse Anesthetist

## 2018-09-16 ENCOUNTER — Encounter (HOSPITAL_COMMUNITY)
Admission: EM | Disposition: A | Discharge status: Home / Self Care | Payer: Self-pay | Source: Home / Self Care | Attending: Internal Medicine

## 2018-09-16 ENCOUNTER — Encounter (HOSPITAL_COMMUNITY): Payer: Self-pay

## 2018-09-16 DIAGNOSIS — D333 Benign neoplasm of cranial nerves: Secondary | ICD-10-CM | POA: Diagnosis not present

## 2018-09-16 DIAGNOSIS — E785 Hyperlipidemia, unspecified: Secondary | ICD-10-CM | POA: Diagnosis not present

## 2018-09-16 DIAGNOSIS — K2971 Gastritis, unspecified, with bleeding: Secondary | ICD-10-CM | POA: Diagnosis not present

## 2018-09-16 DIAGNOSIS — Z7901 Long term (current) use of anticoagulants: Secondary | ICD-10-CM | POA: Diagnosis not present

## 2018-09-16 DIAGNOSIS — D62 Acute posthemorrhagic anemia: Secondary | ICD-10-CM | POA: Diagnosis not present

## 2018-09-16 DIAGNOSIS — E669 Obesity, unspecified: Secondary | ICD-10-CM | POA: Diagnosis not present

## 2018-09-16 DIAGNOSIS — K921 Melena: Secondary | ICD-10-CM | POA: Diagnosis not present

## 2018-09-16 DIAGNOSIS — I10 Essential (primary) hypertension: Secondary | ICD-10-CM | POA: Diagnosis not present

## 2018-09-16 DIAGNOSIS — E119 Type 2 diabetes mellitus without complications: Secondary | ICD-10-CM | POA: Diagnosis not present

## 2018-09-16 DIAGNOSIS — K5521 Angiodysplasia of colon with hemorrhage: Secondary | ICD-10-CM | POA: Diagnosis not present

## 2018-09-16 DIAGNOSIS — I480 Paroxysmal atrial fibrillation: Secondary | ICD-10-CM | POA: Diagnosis not present

## 2018-09-16 DIAGNOSIS — Z6835 Body mass index (BMI) 35.0-35.9, adult: Secondary | ICD-10-CM | POA: Diagnosis not present

## 2018-09-16 DIAGNOSIS — D649 Anemia, unspecified: Secondary | ICD-10-CM | POA: Diagnosis not present

## 2018-09-16 DIAGNOSIS — G4733 Obstructive sleep apnea (adult) (pediatric): Secondary | ICD-10-CM | POA: Diagnosis not present

## 2018-09-16 HISTORY — PX: ESOPHAGOGASTRODUODENOSCOPY (EGD) WITH PROPOFOL: SHX5813

## 2018-09-16 HISTORY — PX: GIVENS CAPSULE STUDY: SHX5432

## 2018-09-16 LAB — COMPREHENSIVE METABOLIC PANEL
ALBUMIN: 3.1 g/dL — AB (ref 3.5–5.0)
ALK PHOS: 29 U/L — AB (ref 38–126)
ALT: 20 U/L (ref 0–44)
ANION GAP: 5 (ref 5–15)
AST: 18 U/L (ref 15–41)
BILIRUBIN TOTAL: 2.7 mg/dL — AB (ref 0.3–1.2)
BUN: 15 mg/dL (ref 8–23)
CALCIUM: 8.6 mg/dL — AB (ref 8.9–10.3)
CO2: 26 mmol/L (ref 22–32)
CREATININE: 0.68 mg/dL (ref 0.44–1.00)
Chloride: 107 mmol/L (ref 98–111)
GFR calc Af Amer: >60 mL/min (ref >60)
GFR calc non Af Amer: >60 mL/min (ref >60)
GLUCOSE: 95 mg/dL (ref 70–99)
Potassium: 3.8 mmol/L (ref 3.5–5.1)
SODIUM: 138 mmol/L (ref 135–145)
TOTAL PROTEIN: 5 g/dL — AB (ref 6.5–8.1)

## 2018-09-16 LAB — CBC
HCT: 24.2 % — ABNORMAL LOW (ref 36.0–46.0)
HEMATOCRIT: 22.6 % — AB (ref 36.0–46.0)
HEMOGLOBIN: 7.5 g/dL — AB (ref 12.0–15.0)
Hemoglobin: 7.2 g/dL — ABNORMAL LOW (ref 12.0–15.0)
MCH: 28.3 pg (ref 26.0–34.0)
MCH: 28 pg (ref 26.0–34.0)
MCHC: 31.9 g/dL (ref 30.0–36.0)
MCHC: 31 g/dL (ref 30.0–36.0)
MCV: 89 fL (ref 80.0–100.0)
MCV: 90.3 fL (ref 80.0–100.0)
PLATELETS: 123 10*3/uL — AB (ref 150–400)
Platelets: 134 10*3/uL — ABNORMAL LOW (ref 150–400)
RBC: 2.54 MIL/uL — AB (ref 3.87–5.11)
RBC: 2.68 MIL/uL — ABNORMAL LOW (ref 3.87–5.11)
RDW: 17.2 % — ABNORMAL HIGH (ref 11.5–15.5)
RDW: 17.3 % — ABNORMAL HIGH (ref 11.5–15.5)
WBC: 6.4 10*3/uL (ref 4.0–10.5)
WBC: 6.5 10*3/uL (ref 4.0–10.5)
nRBC: 0.6 % — ABNORMAL HIGH (ref 0.0–0.2)
nRBC: 0.8 % — ABNORMAL HIGH (ref 0.0–0.2)

## 2018-09-16 LAB — HEMOGLOBIN AND HEMATOCRIT, BLOOD
HCT: 24 % — ABNORMAL LOW (ref 36.0–46.0)
HEMATOCRIT: 23.1 % — AB (ref 36.0–46.0)
Hemoglobin: 7.5 g/dL — ABNORMAL LOW (ref 12.0–15.0)
Hemoglobin: 7.1 g/dL — ABNORMAL LOW (ref 12.0–15.0)

## 2018-09-16 LAB — GLUCOSE, CAPILLARY
GLUCOSE-CAPILLARY: 89 mg/dL (ref 70–99)
Glucose-Capillary: 93 mg/dL (ref 70–99)
Glucose-Capillary: 86 mg/dL (ref 70–99)
Glucose-Capillary: 152 mg/dL — ABNORMAL HIGH (ref 70–99)

## 2018-09-16 LAB — HIV ANTIBODY (ROUTINE TESTING W REFLEX): HIV Screen 4th Generation wRfx: NONREACTIVE

## 2018-09-16 SURGERY — ESOPHAGOGASTRODUODENOSCOPY (EGD) WITH PROPOFOL
Anesthesia: Monitor Anesthesia Care

## 2018-09-16 MED ORDER — SODIUM CHLORIDE 0.9 % IV SOLN
INTRAVENOUS | Status: DC
  Administered 2018-09-16: 09:00:00 via INTRAVENOUS

## 2018-09-16 MED ORDER — LIDOCAINE HCL (CARDIAC) PF 100 MG/5ML IV SOSY
PREFILLED_SYRINGE | INTRAVENOUS | Status: DC | PRN
  Administered 2018-09-16: 80 mg via INTRAVENOUS

## 2018-09-16 MED ORDER — PROPOFOL 500 MG/50ML IV EMUL
INTRAVENOUS | Status: DC | PRN
  Administered 2018-09-16: 150 ug/kg/min via INTRAVENOUS

## 2018-09-16 MED ORDER — BISACODYL 10 MG RE SUPP
10.0000 mg | Freq: Once | RECTAL | Status: DC
  Filled 2018-09-16: qty 1

## 2018-09-16 MED ORDER — SENNOSIDES-DOCUSATE SODIUM 8.6-50 MG PO TABS
2.0000 | ORAL_TABLET | Freq: Once | ORAL | Status: AC
  Administered 2018-09-16: 2 via ORAL
  Filled 2018-09-16: qty 2

## 2018-09-16 MED ORDER — RIVAROXABAN 20 MG PO TABS
20.0000 mg | ORAL_TABLET | Freq: Every day | ORAL | Status: DC
  Administered 2018-09-16 – 2018-09-18 (×3): 20 mg via ORAL
  Filled 2018-09-16 (×3): qty 1

## 2018-09-16 SURGICAL SUPPLY — 14 items

## 2018-09-16 NOTE — Discharge Instructions (Addendum)
Follow with Primary MD [name], MD in 7 days   Get CBC, CMP  checked  by Primary MD  in 5-7 days    Activity: As tolerated with Full fall precautions use walker/cane & assistance as needed  Disposition Home    Diet: Heart Healthy     Special Instructions: If you have smoked or chewed Tobacco  in the last 2 yrs please stop smoking, stop any regular Alcohol  and or any Recreational drug use.  On your next visit with your primary care physician please Get Medicines reviewed and adjusted.  Please request your Prim.MD to go over all Hospital Tests and Procedure/Radiological results at the follow up, please get all Hospital records sent to your Prim MD by signing hospital release before you go home.  If you experience worsening of your admission symptoms, develop shortness of breath, life threatening emergency, suicidal or homicidal thoughts you must seek medical attention immediately by calling 911 or calling your MD immediately  if symptoms less severe.  You Must read complete instructions/literature along with all the possible adverse reactions/side effects for all the Medicines you take and that have been prescribed to you. Take any new Medicines after you have completely understood and accpet all the possible adverse reactions/side effects.

## 2018-09-16 NOTE — Op Note (Signed)
[facility] Patient Name: Erika Kim Procedure Date : 09/16/2018 MRN: [id number] Attending MD: Ronald Lobo , MD Date of Birth: 10-08-1946 CSN: [id number] Age: 72 Admit Type: Inpatient Procedure:                Upper GI endoscopy Indications:              Melena x days while on Xarelto, hgb 6 (nl BUN), no                            ulcerogenic medication exposure Providers:                Ronald Lobo, MD, Burtis Junes, RN, Elspeth Cho                            Tech., Technician, Tressia Miners, CRNA Referring MD:              Medicines:                Monitored Anesthesia Care Complications:            No immediate complications. Estimated Blood Loss:     Estimated blood loss: none. Procedure:                Pre-Anesthesia Assessment:                           - Prior to the procedure, a History and Physical                            was performed, and patient medications and                            allergies were reviewed. The patient's tolerance of                            previous anesthesia was also reviewed. The risks                            and benefits of the procedure and the sedation                            options and risks were discussed with the patient.                            All questions were answered, and informed consent                            was obtained. Prior Anticoagulants: The patient has                            taken Xarelto (rivaroxaban), last dose was 2 days                            prior to procedure. ASA Grade Assessment: III - A  patient with severe systemic disease. After                            reviewing the risks and benefits, the patient was                            deemed in satisfactory condition to undergo the                            procedure.                           After obtaining informed consent, the endoscope was                            passed under direct  vision. Throughout the                            procedure, the patient's blood pressure, pulse, and                            oxygen saturations were monitored continuously. The                            [id number]-H190 ([id number]) Olympus Adult EGD was introduced                            through the mouth, and advanced to the second part                            of duodenum. The upper GI endoscopy was                            accomplished without difficulty. The patient                            tolerated the procedure well. Scope In: Scope Out: Findings:      The larynx was normal.      The examined esophagus was normal.      Localized, focal mildly erythematous mucosa without bleeding was found       on the posterior wall of the gastric antrum.      The exam of the stomach was otherwise normal.      The cardia and gastric fundus were normal on retroflexion.      There is no endoscopic evidence of bleeding, blood, ulceration,       angiodysplasia or erosion in the entire examined stomach.      The examined duodenum was normal including careful four-quadrant       inspection of the bulb. Impression:               - Normal larynx.                           - Normal esophagus.                           -  Erythematous mucosa in the posterior wall of the                            gastric antrum.                           - Normal examined duodenum.                           - No specimens collected.                           - NO EVIDENT CAUSE FOR PATIENT'S MELENIC STOOLS OR                            ANEMIA SEEN ON THIS EXAM. Recommendation:           - Observe patient's clinical course following                            resumption of Xarelto.                           - Use Protonix (pantoprazole) 40 mg PO daily for 2                            weeks.                           - Return to my office (Dr. Therisa Doyne) at appointment to                            be scheduled--monitor  hemoccults, consider video                            capsule endoscopy of the small bowel, ??repeat                            colonoscopy. Procedure Code(s):        --- Professional ---                           [phone], Esophagogastroduodenoscopy, flexible,                            transoral; diagnostic, including collection of                            specimen(s) by brushing or washing, when performed                            (separate procedure) Diagnosis Code(s):        --- Professional ---                           K31.89, Other diseases of stomach and duodenum  K92.1, Melena (includes Hematochezia) CPT copyright 2018 [employer]. All rights reserved. The codes documented in this report are preliminary and upon coder review may  be revised to meet current compliance requirements. Ronald Lobo, MD 09/16/2018 9:45:22 AM This report has been signed electronically. Number of Addenda: 0

## 2018-09-16 NOTE — Interval H&P Note (Signed)
History and Physical Interval Note:  09/16/2018 9:42 AM  Erika Kim  has presented today for surgery, with the diagnosis of melena and anemia  The various methods of treatment have been discussed with the patient and family. After consideration of risks, benefits and other options for treatment, the patient has consented to  Procedure(s): ESOPHAGOGASTRODUODENOSCOPY (EGD) WITH PROPOFOL (N/A) as a surgical intervention .  The patient's history has been reviewed, patient examined, no change in status, stable for surgery.  I have reviewed the patient's chart and labs.  Questions were answered to the patient's satisfaction.     Youlanda Mighty Kipling Graser

## 2018-09-16 NOTE — Progress Notes (Signed)
PROGRESS NOTE                                                                                                                                                                                                             Patient Demographics:    Erika Kim, is a 72 y.o. female, DOB - 02-Sep-1946, DVV:[id number]  Admit date - 09/15/2018   Admitting Physician Costin Karlyne Greenspan, MD  Outpatient Primary MD for the patient is [name], MD  LOS - 0  Chief Complaint  Patient presents with  . Weakness       Brief Narrative Erika Kim is a 72 y.o. female with medical history significant of paroxysmal A. fib on chronic Xarelto, hypertension, hyperlipidemia, OSA, type 2 diabetes mellitus, presents to the hospital with chief complaint of generalized weakness.  She is noticed over the last 6 days that she is been getting progressively weaker and weaker, getting short of breath and dizzy every time she tries to walk.  She is also been complaining of dark stools over the same period of time.  She denies any frank blood in her stool.    She had no other subjective complaints, in the ER she was diagnosed with severe microcytic anemia with hemoglobin of 6 and was admitted for further work-up.   Subjective:    Erika Kim today has, No headache, No chest pain, No abdominal pain - No Nausea, No new weakness tingling or numbness, No Cough - SOB.     Assessment  & Plan :      1.  Symptomatic anemia with melanotic stool in a patient who is on Xarelto.  Status post 2 units of packed RBC transfusion, IV PPI, EGD done on 09/16/2018 was unrevealing, continue to monitor H&H and holding Xarelto.  May require capsule endoscopy plus minus colonoscopy.  Will defer further work-up to GI.  2.  Proximal atrial fibrillation.  Mali vas 2 score of at least 4.  Continue flecainide and beta-blocker, will hold Xarelto due to #1 above.  3.  Dyslipidemia.  On statin continue.    4. OSA.  CPAP  nightly.    5. DM type II.  Currently on sliding scale will continue and monitor.  CBG (last 3)  Recent Labs    09/15/18 2306 09/16/18 0820  GLUCAP 100* 93     Family Communication  :  none  Code Status : Full  Disposition Plan  : Tele  Consults  : GI  Procedures  :  EGD.  Unremarkable.  DVT Prophylaxis  :  SCDs    Lab Results  Component Value Date   PLT 123 (L) 09/16/2018    Diet :  Diet Order            Diet NPO time specified  Diet effective now               Inpatient Medications Scheduled Meds: . [MAR Hold] sodium chloride   Intravenous Once  . [MAR Hold] atorvastatin  40 mg Oral q morning - 10a  . [MAR Hold] flecainide  50 mg Oral BID  . [MAR Hold] insulin aspart  0-5 Units Subcutaneous QHS  . [MAR Hold] insulin aspart  0-9 Units Subcutaneous TID WC  . [MAR Hold] metoprolol tartrate  50 mg Oral BID  . [MAR Hold] pantoprazole (PROTONIX) IV  40 mg Intravenous Q12H   Continuous Infusions: . [MAR Hold] sodium chloride    . sodium chloride     PRN Meds:.  Antibiotics  :   Anti-infectives (From admission, onward)   None          Objective:   Vitals:   09/16/18 0810 09/16/18 0934 09/16/18 0940 09/16/18 0950  BP: (!) 118/33 (!) 107/45 110/63 134/70  Pulse: 60  90 78  Resp: 18 (!) 31 (!) 21 15  Temp: 97.9 F (36.6 C) 98 F (36.7 C)    TempSrc: Oral Oral    SpO2: 99% 98% 100% 100%  Weight:      Height:        Wt Readings from Last 3 Encounters:  09/15/18 90.7 kg  09/09/18 97.1 kg  08/27/18 97.3 kg     Intake/Output Summary (Last 24 hours) at 09/16/2018 0956 Last data filed at 09/16/2018 0940 Gross per 24 hour  Intake 1635.25 ml  Output 150 ml  Net 1485.25 ml     Physical Exam  Awake Alert, Oriented X 3, No new F.N deficits, Normal affect .AT,PERRAL Supple Neck,No JVD, No cervical lymphadenopathy appriciated.  Symmetrical Chest wall movement, Good air movement bilaterally, CTAB RRR,No Gallops,Rubs or new Murmurs, No  Parasternal Heave +ve B.Sounds, Abd Soft, No tenderness, No organomegaly appriciated, No rebound - guarding or rigidity. No Cyanosis, Clubbing or edema, No new Rash or bruise       Data Review:    CBC Recent Labs  Lab 09/15/18 1419 09/16/18 0500  WBC 6.7 6.4  HGB 6.0* 7.2*  HCT 20.6* 22.6*  PLT 161 123*  MCV 97.2 89.0  MCH 28.3 28.3  MCHC 29.1* 31.9  RDW 16.2* 17.2*    Chemistries  Recent Labs  Lab 09/15/18 1419 09/16/18 0500  NA 134* 138  K 3.7 3.8  CL 101 107  CO2 23 26  GLUCOSE 149* 95  BUN 18 15  CREATININE 0.64 0.68  CALCIUM 9.4 8.6*  AST 25 18  ALT 24 20  ALKPHOS 38 29*  BILITOT 1.1 2.7*   ------------------------------------------------------------------------------------------------------------------ No results for input(s): CHOL, HDL, LDLCALC, TRIG, CHOLHDL, LDLDIRECT in the last 72 hours.  Lab Results  Component Value Date   HGBA1C 5.8 (H) 02/19/2017   ------------------------------------------------------------------------------------------------------------------ No results for input(s): TSH, T4TOTAL, T3FREE, THYROIDAB in the last 72 hours.  Invalid input(s): FREET3 ------------------------------------------------------------------------------------------------------------------ No results for input(s): VITAMINB12, FOLATE, FERRITIN, TIBC, IRON, RETICCTPCT in the last 72 hours.  Coagulation profile No results for input(s): INR, PROTIME in the last 168 hours.  No results for input(s): DDIMER in the last 72 hours.  Cardiac Enzymes No results for input(s):  CKMB, TROPONINI, MYOGLOBIN in the last 168 hours.  Invalid input(s): CK ------------------------------------------------------------------------------------------------------------------ No results found for: BNP  Micro Results No results found for this or any previous visit (from the past 240 hour(s)).  Radiology Reports No results found.  Time Spent in minutes  30   Lala Lund M.D on 09/16/2018 at 9:56 AM  To page go to [email or web address] - password [email or web address]

## 2018-09-16 NOTE — Anesthesia Postprocedure Evaluation (Signed)
Anesthesia Post Note  Patient: Erika Kim  Procedure(s) Performed: ESOPHAGOGASTRODUODENOSCOPY (EGD) WITH PROPOFOL (N/A ) GIVENS CAPSULE STUDY (N/A )     Patient location during evaluation: Endoscopy Anesthesia Type: MAC Level of consciousness: awake and alert Pain management: pain level controlled Vital Signs Assessment: post-procedure vital signs reviewed and stable Respiratory status: spontaneous breathing, nonlabored ventilation, respiratory function stable and patient connected to nasal cannula oxygen Cardiovascular status: blood pressure returned to baseline and stable Postop Assessment: no apparent nausea or vomiting Anesthetic complications: no    Last Vitals:  Vitals:   09/16/18 0950 09/16/18 1036  BP: 134/70 (!) 130/50  Pulse: 78   Resp: 15 16  Temp:  36.5 C  SpO2: 100% 98%    Last Pain:  Vitals:   09/16/18 1036  TempSrc: Oral  PainSc:                  Wynema Garoutte L Gerald Kuehl

## 2018-09-16 NOTE — Progress Notes (Signed)
Pt will places self on/off cpap but will call for assistance if needed. RT will monitor.

## 2018-09-16 NOTE — Progress Notes (Signed)
Patient's endoscopy was unrevealing for source of melena and severe anemia.  Please see dictated procedure report.  Recommendations:  1.  We are in the process of doing a video capsule study of the small intestine to look for a source there.  Diet orders written.    2.  I would recommend the patient have her Xarelto restarted this evening  3.  Continue to monitor hemoglobin.  I am somewhat concerned that she did not have an appropriate rise in hemoglobin following her 2-unit transfusion.  On the other hand, her BUN is not elevated, which would go against active, significant gastric or small bowel bleeding.  4.  You may want to touch base with the patient's cardiologist, Dr. [name], to make him aware of the patient's admission and the fact that she has had this bleeding issue.  5.  I would prefer that the patient not be discharged until her capsule endoscopy is read tomorrow, and confirms the absence of active bleeding in the small intestine, with part of that study being done while anticoagulated.  Hopefully, with the study being performed from roughly 10 AM to 10 PM today, we will be able to have it read by mid-afternoon tomorrow, so it may be possible for her to go home tomorrow if there is no sign of bleeding and if her hemoglobin is stable.  6.  In the meantime, I have made patient a hospital follow-up appointment with Dr. Therisa Doyne (her primary gastroenterologist) for December 10 at 9:15 a.m.--at that time, it may be helpful to check Hemoccult status and see how the patient's hemoglobin has been doing over time.  It may be necessary to repeat the patient's colonoscopy (last done just 1 year ago) if the patient shows signs of ongoing blood loss without any alternative explanation.  Cleotis Nipper, M.D. Pager [phone] If no answer or after 5 PM call [phone]

## 2018-09-16 NOTE — Anesthesia Preprocedure Evaluation (Addendum)
Anesthesia Evaluation  Patient identified by MRN, date of birth, ID band Patient awake    Reviewed: Allergy & Precautions, NPO status , Patient's Chart, lab work & pertinent test results  Airway Mallampati: I  TM Distance: >3 FB Neck ROM: Full    Dental  (+) Dental Advisory Given,  Lower bridge:   Pulmonary sleep apnea and Continuous Positive Airway Pressure Ventilation ,    breath sounds clear to auscultation       Cardiovascular hypertension, Pt. on home beta blockers and Pt. on medications + dysrhythmias (on xarelto (last dose 11/23) and flecainide) Atrial Fibrillation  Rhythm:Regular Rate:Normal  Stress Test 06/2018 Blood pressure demonstrated a normal response to exercise. There was no ST segment deviation noted during stress. Negative ETT but patient only achieved 78% of PMHR at 118 bpm Normal hemodynamic response Rare PVCls with stress    Neuro/Psych negative neurological ROS  negative psych ROS   GI/Hepatic negative GI ROS, Neg liver ROS,   Endo/Other  negative endocrine ROSdiabetes, Type 2  Renal/GU negative Renal ROS  negative genitourinary   Musculoskeletal  (+) Arthritis , Osteoarthritis,    Abdominal   Peds  Hematology  (+) Blood dyscrasia, anemia , Hgb 6.0   Anesthesia Other Findings Melena, anemia concern for GIB Hgb 6 now 7.2 after 2U RBCs  Reproductive/Obstetrics                           Anesthesia Physical Anesthesia Plan  ASA: III  Anesthesia Plan: MAC   Post-op Pain Management:    Induction: Intravenous  PONV Risk Score and Plan: 2 and Treatment may vary due to age or medical condition and Propofol infusion  Airway Management Planned: Natural Airway  Additional Equipment:   Intra-op Plan:   Post-operative Plan:   Informed Consent: I have reviewed the patients History and Physical, chart, labs and discussed the procedure including the risks, benefits and  alternatives for the proposed anesthesia with the patient or authorized representative who has indicated his/her understanding and acceptance.   Dental advisory given  Plan Discussed with: CRNA  Anesthesia Plan Comments:         Anesthesia Quick Evaluation

## 2018-09-16 NOTE — Transfer of Care (Signed)
Immediate Anesthesia Transfer of Care Note  Patient: Erika Kim  Procedure(s) Performed: ESOPHAGOGASTRODUODENOSCOPY (EGD) WITH PROPOFOL (N/A )  Patient Location: Endoscopy Unit  Anesthesia Type:MAC  Level of Consciousness: drowsy and responds to stimulation  Airway & Oxygen Therapy: Patient Spontanous Breathing and Patient connected to nasal cannula oxygen  Post-op Assessment: Report given to RN and Post -op Vital signs reviewed and stable  Post vital signs: Reviewed and stable  Last Vitals:  Vitals Value Taken Time  BP 107/45 09/16/2018  9:35 AM  Temp 36.7 C 09/16/2018  9:34 AM  Pulse 89 09/16/2018  9:39 AM  Resp 23 09/16/2018  9:39 AM  SpO2 100 % 09/16/2018  9:39 AM  Vitals shown include unvalidated device data.  Last Pain:  Vitals:   09/16/18 0934  TempSrc: Oral  PainSc: 0-No pain         Complications: No apparent anesthesia complications

## 2018-09-17 ENCOUNTER — Encounter (HOSPITAL_COMMUNITY): Payer: Self-pay | Admitting: Gastroenterology

## 2018-09-17 DIAGNOSIS — D649 Anemia, unspecified: Secondary | ICD-10-CM | POA: Diagnosis not present

## 2018-09-17 DIAGNOSIS — D62 Acute posthemorrhagic anemia: Secondary | ICD-10-CM | POA: Diagnosis not present

## 2018-09-17 DIAGNOSIS — Z7901 Long term (current) use of anticoagulants: Secondary | ICD-10-CM | POA: Diagnosis not present

## 2018-09-17 DIAGNOSIS — K5521 Angiodysplasia of colon with hemorrhage: Secondary | ICD-10-CM | POA: Diagnosis not present

## 2018-09-17 DIAGNOSIS — Z90710 Acquired absence of both cervix and uterus: Secondary | ICD-10-CM | POA: Diagnosis not present

## 2018-09-17 DIAGNOSIS — M1712 Unilateral primary osteoarthritis, left knee: Secondary | ICD-10-CM | POA: Diagnosis present

## 2018-09-17 DIAGNOSIS — R531 Weakness: Secondary | ICD-10-CM | POA: Diagnosis present

## 2018-09-17 DIAGNOSIS — Z880 Allergy status to penicillin: Secondary | ICD-10-CM | POA: Diagnosis not present

## 2018-09-17 DIAGNOSIS — Z888 Allergy status to other drugs, medicaments and biological substances status: Secondary | ICD-10-CM | POA: Diagnosis not present

## 2018-09-17 DIAGNOSIS — E669 Obesity, unspecified: Secondary | ICD-10-CM | POA: Diagnosis not present

## 2018-09-17 DIAGNOSIS — I10 Essential (primary) hypertension: Secondary | ICD-10-CM | POA: Diagnosis not present

## 2018-09-17 DIAGNOSIS — Z96651 Presence of right artificial knee joint: Secondary | ICD-10-CM | POA: Diagnosis present

## 2018-09-17 DIAGNOSIS — I480 Paroxysmal atrial fibrillation: Secondary | ICD-10-CM | POA: Diagnosis not present

## 2018-09-17 DIAGNOSIS — Z8249 Family history of ischemic heart disease and other diseases of the circulatory system: Secondary | ICD-10-CM | POA: Diagnosis not present

## 2018-09-17 DIAGNOSIS — Z6835 Body mass index (BMI) 35.0-35.9, adult: Secondary | ICD-10-CM | POA: Diagnosis not present

## 2018-09-17 DIAGNOSIS — E785 Hyperlipidemia, unspecified: Secondary | ICD-10-CM | POA: Diagnosis not present

## 2018-09-17 DIAGNOSIS — Z79899 Other long term (current) drug therapy: Secondary | ICD-10-CM | POA: Diagnosis not present

## 2018-09-17 DIAGNOSIS — E119 Type 2 diabetes mellitus without complications: Secondary | ICD-10-CM | POA: Diagnosis not present

## 2018-09-17 DIAGNOSIS — D333 Benign neoplasm of cranial nerves: Secondary | ICD-10-CM | POA: Diagnosis not present

## 2018-09-17 DIAGNOSIS — K921 Melena: Secondary | ICD-10-CM | POA: Diagnosis not present

## 2018-09-17 DIAGNOSIS — G4733 Obstructive sleep apnea (adult) (pediatric): Secondary | ICD-10-CM | POA: Diagnosis not present

## 2018-09-17 LAB — RETICULOCYTES
IMMATURE RETIC FRACT: 31.5 % — AB (ref 2.3–15.9)
RBC.: 3.02 MIL/uL — ABNORMAL LOW (ref 3.87–5.11)
Retic Count, Absolute: 205.7 10*3/uL — ABNORMAL HIGH (ref 19.0–186.0)
Retic Ct Pct: 6.8 % — ABNORMAL HIGH (ref 0.4–3.1)

## 2018-09-17 LAB — GLUCOSE, CAPILLARY
GLUCOSE-CAPILLARY: 106 mg/dL — AB (ref 70–99)
GLUCOSE-CAPILLARY: 99 mg/dL (ref 70–99)
GLUCOSE-CAPILLARY: 126 mg/dL — AB (ref 70–99)
GLUCOSE-CAPILLARY: 110 mg/dL — AB (ref 70–99)

## 2018-09-17 LAB — CBC
HEMATOCRIT: 22.5 % — AB (ref 36.0–46.0)
HEMATOCRIT: 27.6 % — AB (ref 36.0–46.0)
HEMOGLOBIN: 6.8 g/dL — AB (ref 12.0–15.0)
HEMOGLOBIN: 8.6 g/dL — AB (ref 12.0–15.0)
MCH: 27.4 pg (ref 26.0–34.0)
MCH: 27.9 pg (ref 26.0–34.0)
MCHC: 30.2 g/dL (ref 30.0–36.0)
MCHC: 31.2 g/dL (ref 30.0–36.0)
MCV: 90.7 fL (ref 80.0–100.0)
MCV: 89.6 fL (ref 80.0–100.0)
NRBC: 0.7 % — AB (ref 0.0–0.2)
Platelets: 122 10*3/uL — ABNORMAL LOW (ref 150–400)
Platelets: 133 10*3/uL — ABNORMAL LOW (ref 150–400)
RBC: 2.48 MIL/uL — ABNORMAL LOW (ref 3.87–5.11)
RBC: 3.08 MIL/uL — AB (ref 3.87–5.11)
RDW: 17 % — ABNORMAL HIGH (ref 11.5–15.5)
RDW: 16.3 % — ABNORMAL HIGH (ref 11.5–15.5)
WBC: 5.7 10*3/uL (ref 4.0–10.5)
WBC: 6.4 10*3/uL (ref 4.0–10.5)
nRBC: 0.9 % — ABNORMAL HIGH (ref 0.0–0.2)

## 2018-09-17 LAB — BASIC METABOLIC PANEL
Anion gap: 5 (ref 5–15)
BUN: 15 mg/dL (ref 8–23)
CO2: 26 mmol/L (ref 22–32)
Calcium: 8.6 mg/dL — ABNORMAL LOW (ref 8.9–10.3)
Chloride: 107 mmol/L (ref 98–111)
Creatinine, Ser: 0.85 mg/dL (ref 0.44–1.00)
GFR calc non Af Amer: >60 mL/min (ref >60)
Glucose, Bld: 107 mg/dL — ABNORMAL HIGH (ref 70–99)
POTASSIUM: 4 mmol/L (ref 3.5–5.1)
SODIUM: 138 mmol/L (ref 135–145)

## 2018-09-17 LAB — PREPARE RBC (CROSSMATCH)

## 2018-09-17 LAB — IRON AND TIBC
IRON: 169 ug/dL (ref 28–170)
Saturation Ratios: 44 % — ABNORMAL HIGH (ref 10.4–31.8)
TIBC: 385 ug/dL (ref 250–450)
UIBC: 216 ug/dL

## 2018-09-17 LAB — FERRITIN: FERRITIN: 11 ng/mL (ref 11–307)

## 2018-09-17 LAB — FOLATE: FOLATE: 30.7 ng/mL (ref >5.9)

## 2018-09-17 LAB — VITAMIN B12: Vitamin B-12: 222 pg/mL (ref 180–914)

## 2018-09-17 MED ORDER — FERROUS SULFATE 325 (65 FE) MG PO TABS: 325.0000 mg | ORAL_TABLET | Freq: Three times a day (TID) | ORAL | Status: DC

## 2018-09-17 MED ORDER — SODIUM CHLORIDE 0.9% IV SOLUTION: Freq: Once | INTRAVENOUS | Status: DC

## 2018-09-17 MED ORDER — FERROUS SULFATE 325 (65 FE) MG PO TABS
325.0000 mg | ORAL_TABLET | Freq: Two times a day (BID) | ORAL | Status: DC
  Administered 2018-09-17 – 2018-09-19 (×5): 325 mg via ORAL
  Filled 2018-09-17 (×5): qty 1

## 2018-09-17 NOTE — Progress Notes (Signed)
CRITICAL VALUE ALERT  Critical Value: Hemoglobin 6.8   Date & Time Notied: 09/17/18 0609  Provider Notified: Hilbert Bible, NP  Orders Received/Actions taken: Awaiting callback or orders to be placed

## 2018-09-17 NOTE — Progress Notes (Addendum)
Low hemoglobin of 6.8 noted this morning, without rising BUN.  Pt has not had any clinical bleeding--2 "tiny pieces" of stool yesterday, no BM's today.  Not clear if there is low-grade ongoing bleeding, or this just reflects equilibration.  Recommend:  1.  She probably needs transfusion of 1 unit of packed cells. 2.  Realistically, I do not think the patient will be ready for discharge today, since we are going to have to monitor stability of her hemoglobin while back on Xarelto 3.  I will try to get the capsule endoscopy read sometime today, but due to a busy service, it may actually be this evening.  Cleotis Nipper, M.D. Pager [phone] If no answer or after 5 PM call [phone]

## 2018-09-17 NOTE — Progress Notes (Addendum)
PROGRESS NOTE                                                                                                                                                                                                             Patient Demographics:    Erika Kim, is a 72 y.o. female, DOB - 28-Jul-1946, [id number]  Admit date - 09/15/2018   Admitting Physician Costin Karlyne Greenspan, MD  Outpatient Primary MD for the patient is [name], MD  LOS - 0  Chief Complaint  Patient presents with  . Weakness       Brief Narrative Erika Kim is a 72 y.o. female with medical history significant of paroxysmal A. fib on chronic Xarelto, hypertension, hyperlipidemia, OSA, type 2 diabetes mellitus, presents to the hospital with chief complaint of generalized weakness.  She is noticed over the last 6 days that she is been getting progressively weaker and weaker, getting short of breath and dizzy every time she tries to walk.  She is also been complaining of dark stools over the same period of time.  She denies any frank blood in her stool.    She had no other subjective complaints, in the ER she was diagnosed with severe microcytic anemia with hemoglobin of 6 and was admitted for further work-up.   Subjective:   Patient in bed, appears comfortable, denies any headache, no fever, no chest pain or pressure, no shortness of breath , no abdominal pain. No focal weakness.    Assessment  & Plan :      1.  Symptomatic anemia with melanotic stool in a patient who is on Xarelto.  No clear source but likely upper GI, continue with IV PPI, EGD done on 09/16/2018 was unrevealing, received 2 units of packed RBC on 09/16/2018, Xarelto has been resumed by GI, continue to monitor H&H, capsule endoscopy was done on 09/16/2018 awaiting results, if no further bleeding likely discharge on 09/18/2018.  Anemia panel ordered will follow.  2.  Proximal atrial fibrillation.  Mali vas 2 score of at least 4.   Continue flecainide and beta-blocker, will hold Xarelto due to #1 above.  3.  Dyslipidemia.  On statin continue.    4. OSA.  CPAP nightly.    5. DM type II.  Currently on sliding scale will continue and monitor.  CBG (last 3)  Recent Labs    09/16/18 1750 09/16/18 2223 09/17/18 0754  GLUCAP 152* 89 106*     Family Communication  :  none  Code Status : Full  Disposition Plan  : Tele  Consults  : GI  Procedures  :   EGD.  Unremarkable.  DVT Prophylaxis  :  SCDs    Lab Results  Component Value Date   PLT 122 (L) 09/17/2018    Diet :  Diet Order            Diet Heart Room service appropriate? Yes; Fluid consistency: Thin  Diet effective ____               Inpatient Medications Scheduled Meds: . atorvastatin  40 mg Oral q morning - 10a  . bisacodyl  10 mg Rectal Once  . ferrous sulfate  325 mg Oral BID WC  . flecainide  50 mg Oral BID  . insulin aspart  0-5 Units Subcutaneous QHS  . insulin aspart  0-9 Units Subcutaneous TID WC  . metoprolol tartrate  50 mg Oral BID  . pantoprazole (PROTONIX) IV  40 mg Intravenous Q12H  . rivaroxaban  20 mg Oral Q supper   Continuous Infusions: . sodium chloride     PRN Meds:.  Antibiotics  :   Anti-infectives (From admission, onward)   None          Objective:   Vitals:   09/17/18 0648 09/17/18 0731 09/17/18 0802 09/17/18 1041  BP: (!) 123/36 (!) 111/59 (!) 106/40 (!) 113/40  Pulse: 78 88 60 66  Resp: 18 16 17 17   Temp: 98 F (36.7 C) 97.8 F (36.6 C) 98.1 F (36.7 C) 98.3 F (36.8 C)  TempSrc: Oral Oral Oral Oral  SpO2: 98% 99% 97% 100%  Weight:      Height:        Wt Readings from Last 3 Encounters:  09/15/18 90.7 kg  09/09/18 97.1 kg  08/27/18 97.3 kg     Intake/Output Summary (Last 24 hours) at 09/17/2018 1116 Last data filed at 09/17/2018 1038 Gross per 24 hour  Intake 315 ml  Output -  Net 315 ml     Physical Exam  Awake Alert, Oriented X 3, No new F.N deficits, Normal  affect [place].AT,PERRAL Supple Neck,No JVD, No cervical lymphadenopathy appriciated.  Symmetrical Chest wall movement, Good air movement bilaterally, CTAB RRR,No Gallops, Rubs or new Murmurs, No Parasternal Heave +ve B.Sounds, Abd Soft, No tenderness, No organomegaly appriciated, No rebound - guarding or rigidity. No Cyanosis, Clubbing or edema, No new Rash or bruise       Data Review:    CBC Recent Labs  Lab 09/15/18 1419 09/16/18 0500 09/16/18 1207 09/16/18 1625 09/16/18 2230 09/17/18 0450  WBC 6.7 6.4  --  6.5  --  5.7  HGB 6.0* 7.2* 7.5* 7.5* 7.1* 6.8*  HCT 20.6* 22.6* 24.0* 24.2* 23.1* 22.5*  PLT 161 123*  --  134*  --  122*  MCV 97.2 89.0  --  90.3  --  90.7  MCH 28.3 28.3  --  28.0  --  27.4  MCHC 29.1* 31.9  --  31.0  --  30.2  RDW 16.2* 17.2*  --  17.3*  --  17.0*    Chemistries  Recent Labs  Lab 09/15/18 1419 09/16/18 0500 09/17/18 0342  NA 134* 138 138  K 3.7 3.8 4.0  CL 101 107 107  CO2 23 26 26   GLUCOSE 149* 95 107*  BUN 18 15 15   CREATININE 0.64 0.68 0.85  CALCIUM 9.4 8.6* 8.6*  AST 25 18  --   ALT 24  20  --   ALKPHOS 38 29*  --   BILITOT 1.1 2.7*  --    ------------------------------------------------------------------------------------------------------------------ No results for input(s): CHOL, HDL, LDLCALC, TRIG, CHOLHDL, LDLDIRECT in the last 72 hours.  Lab Results  Component Value Date   HGBA1C 5.8 (H) 02/19/2017   ------------------------------------------------------------------------------------------------------------------ No results for input(s): TSH, T4TOTAL, T3FREE, THYROIDAB in the last 72 hours.  Invalid input(s): FREET3 ------------------------------------------------------------------------------------------------------------------ No results for input(s): VITAMINB12, FOLATE, FERRITIN, TIBC, IRON, RETICCTPCT in the last 72 hours.  Coagulation profile No results for input(s): INR, PROTIME in the last 168 hours.  No results  for input(s): DDIMER in the last 72 hours.  Cardiac Enzymes No results for input(s): CKMB, TROPONINI, MYOGLOBIN in the last 168 hours.  Invalid input(s): CK ------------------------------------------------------------------------------------------------------------------ No results found for: BNP  Micro Results No results found for this or any previous visit (from the past 240 hour(s)).  Radiology Reports No results found.  Time Spent in minutes  30   Lala Lund M.D on 09/17/2018 at 11:16 AM  To page go to [email or web address] - password [facility]

## 2018-09-17 NOTE — Progress Notes (Signed)
Patient is able to place her self on CPAP when she is ready. Patient requires no assistance.

## 2018-09-18 LAB — BASIC METABOLIC PANEL
Anion gap: 4 — ABNORMAL LOW (ref 5–15)
BUN: 17 mg/dL (ref 8–23)
CALCIUM: 8.6 mg/dL — AB (ref 8.9–10.3)
CHLORIDE: 108 mmol/L (ref 98–111)
CO2: 26 mmol/L (ref 22–32)
CREATININE: 0.8 mg/dL (ref 0.44–1.00)
GFR calc Af Amer: >60 mL/min (ref >60)
GFR calc non Af Amer: >60 mL/min (ref >60)
Glucose, Bld: 120 mg/dL — ABNORMAL HIGH (ref 70–99)
Potassium: 3.9 mmol/L (ref 3.5–5.1)
SODIUM: 138 mmol/L (ref 135–145)

## 2018-09-18 LAB — CBC
HCT: 24.3 % — ABNORMAL LOW (ref 36.0–46.0)
Hemoglobin: 7.4 g/dL — ABNORMAL LOW (ref 12.0–15.0)
MCH: 27.4 pg (ref 26.0–34.0)
MCHC: 30.5 g/dL (ref 30.0–36.0)
MCV: 90 fL (ref 80.0–100.0)
Platelets: 113 10*3/uL — ABNORMAL LOW (ref 150–400)
RBC: 2.7 MIL/uL — ABNORMAL LOW (ref 3.87–5.11)
RDW: 16.4 % — AB (ref 11.5–15.5)
WBC: 5.8 10*3/uL (ref 4.0–10.5)
nRBC: 0.9 % — ABNORMAL HIGH (ref 0.0–0.2)

## 2018-09-18 LAB — GLUCOSE, CAPILLARY
GLUCOSE-CAPILLARY: 110 mg/dL — AB (ref 70–99)
GLUCOSE-CAPILLARY: 97 mg/dL (ref 70–99)
Glucose-Capillary: 108 mg/dL — ABNORMAL HIGH (ref 70–99)
Glucose-Capillary: 114 mg/dL — ABNORMAL HIGH (ref 70–99)

## 2018-09-18 LAB — HEMOGLOBIN AND HEMATOCRIT, BLOOD
HCT: 27.8 % — ABNORMAL LOW (ref 36.0–46.0)
HCT: 25.5 % — ABNORMAL LOW (ref 36.0–46.0)
Hemoglobin: 8.3 g/dL — ABNORMAL LOW (ref 12.0–15.0)
Hemoglobin: 7.8 g/dL — ABNORMAL LOW (ref 12.0–15.0)

## 2018-09-18 MED ORDER — DOCUSATE SODIUM 100 MG PO CAPS
200.0000 mg | ORAL_CAPSULE | Freq: Two times a day (BID) | ORAL | Status: DC
  Administered 2018-09-18 – 2018-09-19 (×2): 200 mg via ORAL
  Filled 2018-09-18 (×3): qty 2

## 2018-09-18 NOTE — Progress Notes (Signed)
Pt stated she could place self on CPAP when ready for bed. RT refilled pt water chamber. Will continue to monitor as needed.

## 2018-09-18 NOTE — Progress Notes (Signed)
PROGRESS NOTE                                                                                                                                                                                                             Patient Demographics:    Erika Kim, is a 72 y.o. female, DOB - 1946-03-23, [id number]  Admit date - 09/15/2018   Admitting Physician Costin Karlyne Greenspan, MD  Outpatient Primary MD for the patient is [name], MD  LOS - 1  Chief Complaint  Patient presents with  . Weakness       Brief Narrative Erika Kim is a 71 y.o. female with medical history significant of paroxysmal A. fib on chronic Xarelto, hypertension, hyperlipidemia, OSA, type 2 diabetes mellitus, presents to the hospital with chief complaint of generalized weakness.  She is noticed over the last 6 days that she is been getting progressively weaker and weaker, getting short of breath and dizzy every time she tries to walk.  She is also been complaining of dark stools over the same period of time.  She denies any frank blood in her stool.    She had no other subjective complaints, in the ER she was diagnosed with severe microcytic anemia with hemoglobin of 6 and was admitted for further work-up.   Subjective:   Patient in bed, appears comfortable, denies any headache, no fever, no chest pain or pressure, no shortness of breath , +ve lower abdominal pain with 1 dark bowel movement last night. No focal weakness.    Assessment  & Plan :      1.  Symptomatic anemia with melanotic stool in a patient who is on Xarelto.  No clear source but likely upper GI, continue with IV PPI, EGD done on 09/16/2018 was unrevealing, received 2 units of packed RBC on 09/16/2018, Xarelto has been resumed by GI, posttransfusion H&H has been stable however she did have a dark bowel movement this morning which could be old blood, still await capsule endoscopy results, anemia panel results were stable, low-dose  oral iron supplementation continued, continue monitoring H&H, if stable discharge tomorrow home.  2.  Proximal atrial fibrillation.  Mali vas 2 score of at least 4.  Continue flecainide and beta-blocker, will hold Xarelto due to #1 above.  3.  Dyslipidemia.  On statin continue.    4. OSA.  CPAP nightly.    5. DM type II.  Currently on sliding scale will continue and monitor.  CBG (last 3)  Recent Labs    09/17/18 1701 09/17/18 2235 09/18/18 0837  GLUCAP 126* 110* 108*     Family Communication  :  none  Code Status : Full  Disposition Plan  : Tele  Consults  : GI  Procedures  :   EGD.  Unremarkable.  Capsule endoscopy.  DVT Prophylaxis  :  SCDs    Lab Results  Component Value Date   PLT 113 (L) 09/18/2018    Diet :  Diet Order            Diet Heart Room service appropriate? Yes; Fluid consistency: Thin  Diet effective ____               Inpatient Medications Scheduled Meds: . atorvastatin  40 mg Oral q morning - 10a  . bisacodyl  10 mg Rectal Once  . ferrous sulfate  325 mg Oral BID WC  . flecainide  50 mg Oral BID  . insulin aspart  0-5 Units Subcutaneous QHS  . insulin aspart  0-9 Units Subcutaneous TID WC  . metoprolol tartrate  50 mg Oral BID  . pantoprazole (PROTONIX) IV  40 mg Intravenous Q12H  . rivaroxaban  20 mg Oral Q supper   Continuous Infusions: . sodium chloride     PRN Meds:.  Antibiotics  :   Anti-infectives (From admission, onward)   None          Objective:   Vitals:   09/17/18 1356 09/17/18 2222 09/18/18 0530 09/18/18 1016  BP: (!) 117/32 (!) 112/48 (!) 112/49 131/74  Pulse: 61 (!) 56 62 (!) 118  Resp:  16 16   Temp: 98.1 F (36.7 C) 98.4 F (36.9 C) 98.1 F (36.7 C)   TempSrc: Oral Oral Oral   SpO2: 98% 100% 98%   Weight:      Height:        Wt Readings from Last 3 Encounters:  09/15/18 90.7 kg  09/09/18 97.1 kg  08/27/18 97.3 kg    No intake or output data in the 24 hours ending 09/18/18  1123   Physical Exam  Awake Alert, Oriented X 3, No new F.N deficits, Normal affect [place].AT,PERRAL Supple Neck,No JVD, No cervical lymphadenopathy appriciated.  Symmetrical Chest wall movement, Good air movement bilaterally, CTAB RRR,No Gallops, Rubs or new Murmurs, No Parasternal Heave +ve B.Sounds, Abd Soft, No tenderness, No organomegaly appriciated, No rebound - guarding or rigidity. No Cyanosis, Clubbing or edema, No new Rash or bruise    Data Review:    CBC Recent Labs  Lab 09/16/18 0500  09/16/18 1625 09/16/18 2230 09/17/18 0450 09/17/18 1640 09/18/18 0411 09/18/18 0807  WBC 6.4  --  6.5  --  5.7 6.4 5.8  --   HGB 7.2*   < > 7.5* 7.1* 6.8* 8.6* 7.4* 8.3*  HCT 22.6*   < > 24.2* 23.1* 22.5* 27.6* 24.3* 27.8*  PLT 123*  --  134*  --  122* 133* 113*  --   MCV 89.0  --  90.3  --  90.7 89.6 90.0  --   MCH 28.3  --  28.0  --  27.4 27.9 27.4  --   MCHC 31.9  --  31.0  --  30.2 31.2 30.5  --   RDW 17.2*  --  17.3*  --  17.0* 16.3* 16.4*  --    < > = values in this interval not displayed.    Chemistries  Recent Labs  Lab 09/15/18 1419 09/16/18 0500 09/17/18  8251 09/18/18 0411  NA 134* 138 138 138  K 3.7 3.8 4.0 3.9  CL 101 107 107 108  CO2 23 26 26 26   GLUCOSE 149* 95 107* 120*  BUN 18 15 15 17   CREATININE 0.64 0.68 0.85 0.80  CALCIUM 9.4 8.6* 8.6* 8.6*  AST 25 18  --   --   ALT 24 20  --   --   ALKPHOS 38 29*  --   --   BILITOT 1.1 2.7*  --   --    ------------------------------------------------------------------------------------------------------------------ No results for input(s): CHOL, HDL, LDLCALC, TRIG, CHOLHDL, LDLDIRECT in the last 72 hours.  Lab Results  Component Value Date   HGBA1C 5.8 (H) 02/19/2017   ------------------------------------------------------------------------------------------------------------------ No results for input(s): TSH, T4TOTAL, T3FREE, THYROIDAB in the last 72 hours.  Invalid input(s):  FREET3 ------------------------------------------------------------------------------------------------------------------ Recent Labs    09/17/18 1214  VITAMINB12 222  FOLATE 30.7  FERRITIN 11  TIBC 385  IRON 169  RETICCTPCT 6.8*    Coagulation profile No results for input(s): INR, PROTIME in the last 168 hours.  No results for input(s): DDIMER in the last 72 hours.  Cardiac Enzymes No results for input(s): CKMB, TROPONINI, MYOGLOBIN in the last 168 hours.  Invalid input(s): CK ------------------------------------------------------------------------------------------------------------------ No results found for: BNP  Micro Results No results found for this or any previous visit (from the past 240 hour(s)).  Radiology Reports No results found.  Time Spent in minutes  30   Lala Lund M.D on 09/18/2018 at 11:23 AM  To page go to [email or web address] - password [facility]

## 2018-09-18 NOTE — Progress Notes (Signed)
Small bowel capsule endoscopy, which I read this afternoon, shows active, multifocal oozing from multiple sites in the small bowel, both proximal and distal.  Approximately 7 such sites were identified, characterized by a small plume of red blood emanating from otherwise normal-appearing mucosa.  Although no AVMs were identified, it is presumed that some sort of vascular ectasia is accounting for this blood loss.  I have discussed these findings with Dr. Candiss Norse.  In my opinion, this sort of multifocal oozing, without a "dominant lesion" to go after, is not amenable to any type of treatment, be it endoscopic, IR, or surgical.  Therefore, the options that I see available to [place] are as follows:  1.  Maintain patient on Xarelto and monitor hemoglobin frequently, transfusing as needed.  If the transfusion requirement becomes onerous, take patient off Xarelto and consider alternative means of anticoagulation (?  Alternative DOAC, "tight" Coumadin)  2.  Take patient off Xarelto for now, and give her a several week "holiday" to build up her blood count, hopefully achieve hemostasis, and then rechallenge her with Xarelto, monitoring hemoglobin and Hemoccult status.  3.  Take patient off Xarelto permanently and hope for the best  4.  Attempt balloon enteroscopy with therapeutic intervention (although, as mentioned above, I strongly doubt that this would be efficacious).  We will sign off.  Please let us know if we can be of further assistance.  Cleotis Nipper, M.D. Pager [phone] If no answer or after 5 PM call [phone]

## 2018-09-19 ENCOUNTER — Telehealth: Payer: Self-pay | Admitting: Student

## 2018-09-19 LAB — BPAM RBC
BLOOD PRODUCT EXPIRATION DATE: 201912062359
BLOOD PRODUCT EXPIRATION DATE: 201912122359
BLOOD PRODUCT EXPIRATION DATE: 201912192359
BLOOD PRODUCT EXPIRATION DATE: 201912202359
ISSUE DATE / TIME: 201911241610
ISSUE DATE / TIME: 201911242039
ISSUE DATE / TIME: 201911260737
UNIT TYPE AND RH: 6200
UNIT TYPE AND RH: 6200
Unit Type and Rh: 6200
Unit Type and Rh: 6200

## 2018-09-19 LAB — TYPE AND SCREEN
ABO/RH(D): A POS
Antibody Screen: NEGATIVE
UNIT DIVISION: 0
UNIT DIVISION: 0
UNIT DIVISION: 0
Unit division: 0

## 2018-09-19 LAB — CBC
HCT: 26.6 % — ABNORMAL LOW (ref 36.0–46.0)
Hemoglobin: 8.1 g/dL — ABNORMAL LOW (ref 12.0–15.0)
MCH: 27.9 pg (ref 26.0–34.0)
MCHC: 30.5 g/dL (ref 30.0–36.0)
MCV: 91.7 fL (ref 80.0–100.0)
NRBC: 0.3 % — AB (ref 0.0–0.2)
PLATELETS: 129 10*3/uL — AB (ref 150–400)
RBC: 2.9 MIL/uL — ABNORMAL LOW (ref 3.87–5.11)
RDW: 17.4 % — AB (ref 11.5–15.5)
WBC: 6.6 10*3/uL (ref 4.0–10.5)

## 2018-09-19 LAB — GLUCOSE, CAPILLARY: GLUCOSE-CAPILLARY: 105 mg/dL — AB (ref 70–99)

## 2018-09-19 MED ORDER — PANTOPRAZOLE SODIUM 40 MG PO TBEC: 40.0000 mg | DELAYED_RELEASE_TABLET | Freq: Every day | ORAL | 0 refills | Status: DC

## 2018-09-19 MED ORDER — DOCUSATE SODIUM 100 MG PO CAPS: 100.0000 mg | ORAL_CAPSULE | Freq: Every day | ORAL | 0 refills | Status: AC

## 2018-09-19 MED ORDER — FERROUS SULFATE 325 (65 FE) MG PO TABS: 325.0000 mg | ORAL_TABLET | Freq: Two times a day (BID) | ORAL | 0 refills | Status: DC

## 2018-09-19 NOTE — Progress Notes (Signed)
Nsg Discharge Note  Admit Date:  09/15/2018 Discharge date: 09/19/2018   Erika Kim to be D/C'd Home per MD order.  AVS completed.  Copy for chart, and copy for patient signed, and dated. Patient/caregiver able to verbalize understanding.  Discharge Medication: Allergies as of 09/19/2018      Reactions   Cephalosporins Itching   severe   Augmentin [amoxicillin-pot Clavulanate] Itching   severe      Medication List    STOP taking these medications   XARELTO 20 MG Tabs tablet Generic drug:  rivaroxaban     TAKE these medications   atorvastatin 40 MG tablet Commonly known as:  LIPITOR Take 40 mg by mouth every morning.   docusate sodium 100 MG capsule Commonly known as:  COLACE Take 1 capsule (100 mg total) by mouth daily.   ferrous sulfate 325 (65 FE) MG tablet Take 1 tablet (325 mg total) by mouth 2 (two) times daily with a meal.   flecainide 50 MG tablet Commonly known as:  TAMBOCOR Take 1 tablet (50 mg total) by mouth 2 (two) times daily.   ipratropium 0.03 % nasal spray Commonly known as:  ATROVENT Place 1-2 sprays into the nose at bedtime as needed for rhinitis.   JANUVIA 100 MG tablet Generic drug:  sitaGLIPtin Take 100 mg by mouth daily after supper.   lisinopril-hydrochlorothiazide 20-12.5 MG tablet Commonly known as:  PRINZIDE,ZESTORETIC Take 1 tablet by mouth every morning.   metoprolol succinate 50 MG 24 hr tablet Commonly known as:  TOPROL-XL Take 50 mg by mouth 2 (two) times daily. May take a third time if heart goes into a-fib   multivitamin tablet Take 1 tablet by mouth daily.   pantoprazole 40 MG tablet Commonly known as:  PROTONIX Take 1 tablet (40 mg total) by mouth daily.   saline Gel Place 1 application into the nose at bedtime.   SYSTANE OP Place 1 drop into both eyes daily as needed (dry eyes).   Vitamin D3 125 MCG (5000 UT) Tabs Take 5,000 Units by mouth daily.       Discharge Assessment: Vitals:   09/18/18 2151  09/19/18 0531  BP:  (!) 130/53  Pulse: 65 (!) 115  Resp:  16  Temp:  98 F (36.7 C)  SpO2:  98%   Skin clean, dry and intact without evidence of skin break down, no evidence of skin tears noted. IV catheter discontinued intact. Site without signs and symptoms of complications - no redness or edema noted at insertion site, patient denies c/o pain - only slight tenderness at site.  Dressing with slight pressure applied.  D/c Instructions-Education: Discharge instructions given to patient/family with verbalized understanding. D/c education completed with patient/family including follow up instructions, medication list, d/c activities limitations if indicated, with other d/c instructions as indicated by MD - patient able to verbalize understanding, all questions fully answered. Patient instructed to return to ED, call 911, or call MD for any changes in condition.  Patient escorted via [place], and D/C home via private auto.  [place] N Martika Egler, RN 09/19/2018 10:36 AM

## 2018-09-19 NOTE — Telephone Encounter (Signed)
   Patient called the after hours line regarding Xarleto by the page sent from the answering service. Attempted to call the patient multiple times but unable to reach.    Appears she was just admitted to [facility] from 11/24 - 09/19/2018 for symptomatic anemia and received 3 units pRBC's. GI was consulted and options reviewed and it was recommended to hold Xarelto at this time and readdress with her Primary Cardiologist (Dr. Marlou Porch) as an outpatient.   Signed, Erma Heritage, PA-C 09/19/2018, 12:38 PM Pager: [phone]  The patient called back on the hospital phone. We reviewed that she is at increased risk for a CVA or clot while Xarelto is held but given her anemia and recent active bleeding, the risk and benefits were reviewed with her during admission and that is why it was recommended to hold this at the time of discharge. She is scheduled for repeat labs with her PCP next week and is also scheduled to see EP next week but wants to hear Dr. Marlou Porch recommendations as well. Will route today's note to him for further recommendations. Of note, she was continued on Flecainide at discharge.   Signed, Erma Heritage, PA-C 09/19/2018, 12:53 PM Pager: [phone]

## 2018-09-19 NOTE — Discharge Summary (Signed)
Erika Kim OMB:[id number] DOB: 1946-08-18 DOA: 09/15/2018  PCP: [name], MD  Admit date: 09/15/2018  Discharge date: 09/19/2018  Admitted From: Home   Disposition:  Home   Recommendations for Outpatient Follow-up:   Follow up with PCP in 1-2 weeks  PCP Please obtain BMP/CBC, 2 view CXR in 1week,  (see Discharge instructions)   PCP Please follow up on the following pending results: Monitor H&H   Home Health: None   Equipment/Devices: None  Consultations: GI Discharge Condition: Stable   CODE STATUS: Full   Diet Recommendation: Heart Healthy      Chief Complaint  Patient presents with  . Weakness     Brief history of present illness from the day of admission and additional interim summary    Erika Kim a 72 y.o.femalewith medical history significant ofparoxysmal A. fib on chronic Xarelto, hypertension, hyperlipidemia, OSA, type 2 diabetes mellitus, presents to the hospital with chief complaint of generalized weakness. She is noticed over the last 6 days that she is been getting progressively weaker and weaker, getting short of breath and dizzy every time she tries to walk. She is also been complaining of dark stools over the same period of time. She denies any frank blood in her stool.   She had no other subjective complaints, in the ER she was diagnosed with severe microcytic anemia with hemoglobin of 6 and was admitted for further work-up.                                                                 Hospital Course    1.  Symptomatic anemia with melanotic stool in a patient who is on Xarelto.  Source appears to be small bowel per capsule endoscopy which showed multiple low-grade sites which were oozing red blood, was seen by GI Dr. Cristina Gong also underwent an unremarkable EGD.   She received 3 units of packed RBC transfusion with stable posttransfusion H&H.  Plan was discussed with GI physician Dr. Cristina Gong, patient and her husband.  At this time no anticoagulation, oral PPI and oral iron.  Outpatient H&H monitoring.  Patient was offered 81 mg of aspirin for now but she wants no anticoagulation for now she says she is going to follow with her cardiologist in the next 1 to 2 days and discuss her options.  She understands and accepts the risk of CVA due to her A. fib at this point.   2.  Proximal atrial fibrillation.  Mali vas 2 score of at least 4.  Continue flecainide and beta-blocker, will hold Xarelto due to #1 above.  3.  Dyslipidemia.  On statin continue.    4. OSA.  CPAP nightly.    5. DM type II.  Continue home regimen.    Discharge diagnosis  Principal Problem:   GI bleed Active Problems:   Obesity   Paroxysmal atrial fibrillation (HCC)   Chronic anticoagulation    Discharge instructions    Discharge Instructions    Diet - low sodium heart healthy   Complete by:  As directed    Discharge instructions   Complete by:  As directed    Follow with Primary MD [name], MD in 7 days   Get CBC, CMP  checked  by Primary MD  in 5-7 days    Activity: As tolerated with Full fall precautions use walker/cane & assistance as needed  Disposition Home    Diet: Heart Healthy     Special Instructions: If you have smoked or chewed Tobacco  in the last 2 yrs please stop smoking, stop any regular Alcohol  and or any Recreational drug use.  On your next visit with your primary care physician please Get Medicines reviewed and adjusted.  Please request your Prim.MD to go over all Hospital Tests and Procedure/Radiological results at the follow up, please get all Hospital records sent to your Prim MD by signing hospital release before you go home.  If you experience worsening of your admission symptoms, develop shortness of breath, life  threatening emergency, suicidal or homicidal thoughts you must seek medical attention immediately by calling 911 or calling your MD immediately  if symptoms less severe.  You Must read complete instructions/literature along with all the possible adverse reactions/side effects for all the Medicines you take and that have been prescribed to you. Take any new Medicines after you have completely understood and accpet all the possible adverse reactions/side effects.   Increase activity slowly   Complete by:  As directed       Discharge Medications   Allergies as of 09/19/2018      Reactions   Cephalosporins Itching   severe   Augmentin [amoxicillin-pot Clavulanate] Itching   severe      Medication List    STOP taking these medications   XARELTO 20 MG Tabs tablet Generic drug:  rivaroxaban     TAKE these medications   atorvastatin 40 MG tablet Commonly known as:  LIPITOR Take 40 mg by mouth every morning.   docusate sodium 100 MG capsule Commonly known as:  COLACE Take 1 capsule (100 mg total) by mouth daily.   ferrous sulfate 325 (65 FE) MG tablet Take 1 tablet (325 mg total) by mouth 2 (two) times daily with a meal.   flecainide 50 MG tablet Commonly known as:  TAMBOCOR Take 1 tablet (50 mg total) by mouth 2 (two) times daily.   ipratropium 0.03 % nasal spray Commonly known as:  ATROVENT Place 1-2 sprays into the nose at bedtime as needed for rhinitis.   JANUVIA 100 MG tablet Generic drug:  sitaGLIPtin Take 100 mg by mouth daily after supper.   lisinopril-hydrochlorothiazide 20-12.5 MG tablet Commonly known as:  PRINZIDE,ZESTORETIC Take 1 tablet by mouth every morning.   metoprolol succinate 50 MG 24 hr tablet Commonly known as:  TOPROL-XL Take 50 mg by mouth 2 (two) times daily. May take a third time if heart goes into a-fib   multivitamin tablet Take 1 tablet by mouth daily.   pantoprazole 40 MG tablet Commonly known as:  PROTONIX Take 1 tablet (40 mg  total) by mouth daily.   saline Gel Place 1 application into the nose at bedtime.   SYSTANE OP Place 1 drop into both eyes daily as needed (dry eyes).  Vitamin D3 125 MCG (5000 UT) Tabs Take 5,000 Units by mouth daily.       Follow-up Information    [name], MD. Schedule an appointment as soon as possible for a visit in 1 week(s).   Specialty:  Internal Medicine Contact information: [place] [address] [phone]        Jerline Pain, MD. Schedule an appointment as soon as possible for a visit in 1 day(s).   Specialty:  Cardiology Contact information: [address] [place] [address] [phone]        Ronald Lobo, MD. Schedule an appointment as soon as possible for a visit in 1 week(s).   Specialty:  Gastroenterology Contact information: [address] [place] [address] [phone]           Major procedures and Radiology Reports - PLEASE review detailed and final reports thoroughly  -     EGD.  Unremarkable.  Capsule endoscopy.  Multiple low grade small bowel oozing   No results found.  Micro Results     No results found for this or any previous visit (from the past 240 hour(s)).  Today   Subjective    Machele Deihl today has no headache,no chest abdominal pain,no new weakness tingling or numbness, feels much better wants to go home today.    Objective   Blood pressure (!) 130/53, pulse (!) 115, temperature 98 F (36.7 C), temperature source Oral, resp. rate 16, height 5\' 3"  (1.6 m), weight 90.7 kg, SpO2 98 %.  No intake or output data in the 24 hours ending 09/19/18 0848  Exam Awake Alert, Oriented x 3, No new F.N deficits, Normal affect [place].AT,PERRAL Supple Neck,No JVD, No cervical lymphadenopathy appriciated.  Symmetrical Chest wall movement, Good air movement bilaterally, CTAB RRR,No Gallops,Rubs or new Murmurs, No Parasternal Heave +ve B.Sounds, Abd Soft,  Non tender, No organomegaly appriciated, No rebound -guarding or rigidity. No Cyanosis, Clubbing or edema, No new Rash or bruise   Data Review   CBC w Diff:  Lab Results  Component Value Date   WBC 6.6 09/19/2018   HGB 8.1 (L) 09/19/2018   HCT 26.6 (L) 09/19/2018   PLT 129 (L) 09/19/2018   LYMPHOPCT 30 02/19/2017   MONOPCT 8 02/19/2017   EOSPCT 3 02/19/2017   BASOPCT 1 02/19/2017    CMP:  Lab Results  Component Value Date   NA 138 09/18/2018   K 3.9 09/18/2018   CL 108 09/18/2018   CO2 26 09/18/2018   BUN 17 09/18/2018   CREATININE 0.80 09/18/2018   PROT 5.0 (L) 09/16/2018   ALBUMIN 3.1 (L) 09/16/2018   BILITOT 2.7 (H) 09/16/2018   ALKPHOS 29 (L) 09/16/2018   AST 18 09/16/2018   ALT 20 09/16/2018  .   Total Time in preparing paper work, data evaluation and todays exam - 51 minutes  Lala Lund M.D on 09/19/2018 at 8:48 AM  [facility]   Office  [phone]

## 2018-09-23 NOTE — Progress Notes (Signed)
Electrophysiology Office Note   Date:  09/24/2018   ID:  Erika, Kim [birth year], MRN [id number]  PCP:  [name], MD  Cardiologist:  Marlou Porch Primary Electrophysiologist:   Meredith Leeds, MD    No chief complaint on file.    History of Present Illness: Erika Kim is a 72 y.o. female who is being seen today for the evaluation of atrial fibrillation at the request of [name]. Presenting today for electrophysiology evaluation.  She has a history of hypertension, hyperlipidemia, paroxysmal atrial fibrillation, diabetes, and OSA on CPAP.  She has been having occasional episodes of atrial fibrillation that is increased despite starting flecainide.  She has had been having side effects to flecainide.  She was recently admitted to the hospital with symptomatic anemia and melanotic stools.  Her Xarelto was stopped.  She had a capsule endoscopy which showed oozing blood from multiple low-grade lesions.  She is planned for a CBC today.  Today, she denies symptoms of palpitations, chest pain, shortness of breath, orthopnea, PND, lower extremity edema, claudication, dizziness, presyncope, syncope, bleeding, or neurologic sequela. The patient is tolerating medications without difficulties.    Past Medical History:  Diagnosis Date  . Acoustic neuroma ([place]) followed by dr brown at [facility]   right side w/ chronic roaring noise  . Anticoagulant long-term use    xarelto  . Benign paroxysmal positional vertigo   . Endometrial polyp   . Frequency of urination   . Hyperlipidemia   . Hypertension   . OA (osteoarthritis)    left knee  . OSA on CPAP   . PAF (paroxysmal atrial fibrillation) [facility]) cardiologist-  dr Marlou Porch   dx 04/ 2017  . PMB (postmenopausal bleeding)   . TMJ (temporomandibular joint disorder)    left side-- wears guard  . Type 2 diabetes mellitus ([place])   . Wears contact lenses    Past Surgical History:  Procedure Laterality Date  . CARPAL TUNNEL  RELEASE Right 04/17/2007   w/ Excision ganglion cyst and Pulley Release right thumb  . DILATATION & CURETTAGE/HYSTEROSCOPY WITH MYOSURE N/A 01/12/2017   Procedure: DILATATION & CURETTAGE/HYSTEROSCOPY WITH MYOSURE;  Surgeon: Arvella Nigh, MD;  Location: [place];  Service: Gynecology;  Laterality: N/A;  . ESOPHAGOGASTRODUODENOSCOPY (EGD) WITH PROPOFOL N/A 09/16/2018   Procedure: ESOPHAGOGASTRODUODENOSCOPY (EGD) WITH PROPOFOL;  Surgeon: Ronald Lobo, MD;  Location: [place];  Service: Endoscopy;  Laterality: N/A;  . GIVENS CAPSULE STUDY N/A 09/16/2018   Procedure: GIVENS CAPSULE STUDY;  Surgeon: Ronald Lobo, MD;  Location: [facility];  Service: Endoscopy;  Laterality: N/A;  . HAMMER TOE SURGERY Left 2010   left second toe  . KNEE ARTHROSCOPY Right 2010  . KNEE SURGERY Left 1993  . LYMPH NODE BIOPSY N/A 03/01/2017   Procedure: SENTINEL LYMPH NODE BIOPSY;  Surgeon: Everitt Amber, MD;  Location: [facility];  Service: Gynecology;  Laterality: N/A;  . NASAL SINUS SURGERY  1985 and 1995  . ROBOTIC ASSISTED TOTAL HYSTERECTOMY WITH BILATERAL SALPINGO OOPHERECTOMY Bilateral 03/01/2017   Procedure: XI ROBOTIC ASSISTED TOTAL HYSTERECTOMY WITH BILATERAL SALPINGO OOPHORECTOMY;  Surgeon: Everitt Amber, MD;  Location: [facility];  Service: Gynecology;  Laterality: Bilateral;  . ROTATOR CUFF REPAIR Right 04/2004  . TOE SURGERY Right 2012   Great toe and second toe  . TOTAL ELBOW REPLACEMENT Right 2000  . TOTAL KNEE ARTHROPLASTY Right 01/12/2010  . TRANSTHORACIC ECHOCARDIOGRAM  03/28/2016   ef 60-65%/ mild AR and MR  . TUBAL LIGATION Bilateral 1978  Current Outpatient Medications  Medication Sig Dispense Refill  . atorvastatin (LIPITOR) 40 MG tablet Take 40 mg by mouth every morning.     . Cholecalciferol (VITAMIN D3) 5000 units TABS Take 5,000 Units by mouth daily.     Marland Kitchen docusate sodium (COLACE) 100 MG capsule Take 1 capsule (100 mg total) by mouth daily. 30 capsule 0  . ferrous  sulfate 325 (65 FE) MG tablet Take 1 tablet (325 mg total) by mouth 2 (two) times daily with a meal. 60 tablet 0  . flecainide (TAMBOCOR) 50 MG tablet Take 1 tablet (50 mg total) by mouth 2 (two) times daily. 180 tablet 3  . ipratropium (ATROVENT) 0.03 % nasal spray Place 1-2 sprays into the nose at bedtime as needed for rhinitis.     Marland Kitchen JANUVIA 100 MG tablet Take 100 mg by mouth daily after supper.     Marland Kitchen lisinopril-hydrochlorothiazide (PRINZIDE,ZESTORETIC) 20-12.5 MG per tablet Take 1 tablet by mouth every morning.     . metoprolol succinate (TOPROL-XL) 50 MG 24 hr tablet Take 50 mg by mouth 2 (two) times daily. May take a third time if heart goes into a-fib    . Multiple Vitamin (MULTIVITAMIN) tablet Take 1 tablet by mouth daily.    . pantoprazole (PROTONIX) 40 MG tablet Take 1 tablet (40 mg total) by mouth daily. 30 tablet 0  . Polyethyl Glycol-Propyl Glycol (SYSTANE OP) Place 1 drop into both eyes daily as needed (dry eyes).    . saline (AYR) GEL Place 1 application into the nose at bedtime.     No current facility-administered medications for this visit.     Allergies:   Cephalosporins and Augmentin [amoxicillin-pot clavulanate]   Social History:  The patient  reports that she has never smoked. She has never used smokeless tobacco. She reports that she does not drink alcohol or use drugs.   Family History:  The patient's family history includes Heart failure in her father and mother.    ROS:  Please see the history of present illness.   Otherwise, review of systems is positive for stations, visual changes, shortness of breath, constipation, dizziness.   All other systems are reviewed and negative.    PHYSICAL EXAM: VS:  BP (!) 130/56   Pulse 67   Ht 5\' 3"  (1.6 m)   Wt 207 lb 6.4 oz (94.1 kg)   SpO2 99%   BMI 36.74 kg/m  , BMI Body mass index is 36.74 kg/m. GEN: Well nourished, well developed, in no acute distress  HEENT: normal  Neck: no JVD, carotid bruits, or masses Cardiac:  RRR; no murmurs, rubs, or gallops,no edema  Respiratory:  clear to auscultation bilaterally, normal work of breathing GI: soft, nontender, nondistended, + BS MS: no deformity or atrophy  Skin: warm and dry Neuro:  Strength and sensation are intact Psych: euthymic mood, full affect  EKG:  EKG is not ordered today. Personal review of the ekg ordered 09/15/18 shows AF, rate 99  Recent Labs: 09/16/2018: ALT 20 09/18/2018: BUN 17; Creatinine, Ser 0.80; Potassium 3.9; Sodium 138 09/19/2018: Hemoglobin 8.1; Platelets 129    Lipid Panel  No results found for: CHOL, TRIG, HDL, CHOLHDL, VLDL, LDLCALC, LDLDIRECT   Wt Readings from Last 3 Encounters:  09/24/18 207 lb 6.4 oz (94.1 kg)  09/15/18 200 lb (90.7 kg)  09/09/18 214 lb (97.1 kg)      Other studies Reviewed: Additional studies/ records that were reviewed today include: TTE 2017  Review of the above  records today demonstrates:  - Left ventricle: The cavity size was normal. Systolic function was   normal. The estimated ejection fraction was in the range of 60%   to 65%. Wall motion was normal; there were no regional wall   motion abnormalities. The transmitral flow pattern was normal.   Left ventricular diastolic function parameters were normal. - Aortic valve: Moderate focal calcification, consistent with   sclerosis. There was mild regurgitation. - Mitral valve: There was mild regurgitation.   ASSESSMENT AND PLAN:  1.  Paroxysmal atrial fibrillation: Currently on Xarelto and flecainide.  Her flecainide is no longer controlling her symptoms.  She does prefer to have ablation performed.  Risks and benefits were discussed.  Risks include bleeding, tamponade, heart block, stroke, damage to surrounding organs.  She understands these risks and is agreed to the procedure.  She has had symptoms from anemia with GI bleeding.  Plan for a CBC today.  If she can tolerate Eliquis,  plan to restart.  I  discuss this with her  gastroenterologist.  This patients CHA2DS2-VASc Score and unadjusted Ischemic Stroke Rate (% per year) is equal to 4.8 % stroke rate/year from a score of 4  Above score calculated as 1 point each if present [CHF, HTN, DM, Vascular=MI/PAD/Aortic Plaque, Age if 65-74, or Female] Above score calculated as 2 points each if present [Age > 75, or Stroke/TIA/TE]  2.  Obstructive sleep apnea: Continue CPAP  3.  Hypertension: Well-controlled  4.  Hyperlipidemia: Continue atorvastatin  5.  Morbid obesity: Diet and exercise encouraged    Current medicines are reviewed at length with the patient today.   The patient does not have concerns regarding her medicines.  The following changes were made today:  none  Labs/ tests ordered today include:  No orders of the defined types were placed in this encounter.    Disposition:   FU with   3 months  Signed,  Meredith Leeds, MD  09/24/2018 9:32 AM     [facility] [address] [place] [address] [phone] (office) [phone] (fax)

## 2018-09-24 ENCOUNTER — Ambulatory Visit: Payer: Medicare HMO | Admitting: Cardiology

## 2018-09-24 ENCOUNTER — Telehealth: Payer: Self-pay | Admitting: Cardiology

## 2018-09-24 ENCOUNTER — Encounter: Payer: Self-pay | Admitting: Cardiology

## 2018-09-24 VITALS — BP 130/56 | HR 67 | Ht 63.0 in | Wt 207.4 lb

## 2018-09-24 DIAGNOSIS — Z6838 Body mass index (BMI) 38.0-38.9, adult: Secondary | ICD-10-CM | POA: Diagnosis not present

## 2018-09-24 DIAGNOSIS — I480 Paroxysmal atrial fibrillation: Secondary | ICD-10-CM

## 2018-09-24 DIAGNOSIS — E7849 Other hyperlipidemia: Secondary | ICD-10-CM | POA: Diagnosis not present

## 2018-09-24 DIAGNOSIS — G4733 Obstructive sleep apnea (adult) (pediatric): Secondary | ICD-10-CM | POA: Diagnosis not present

## 2018-09-24 DIAGNOSIS — D62 Acute posthemorrhagic anemia: Secondary | ICD-10-CM | POA: Insufficient documentation

## 2018-09-24 DIAGNOSIS — E1151 Type 2 diabetes mellitus with diabetic peripheral angiopathy without gangrene: Secondary | ICD-10-CM | POA: Diagnosis not present

## 2018-09-24 DIAGNOSIS — E785 Hyperlipidemia, unspecified: Secondary | ICD-10-CM

## 2018-09-24 DIAGNOSIS — Z7901 Long term (current) use of anticoagulants: Secondary | ICD-10-CM | POA: Diagnosis not present

## 2018-09-24 DIAGNOSIS — I10 Essential (primary) hypertension: Secondary | ICD-10-CM

## 2018-09-24 NOTE — Telephone Encounter (Signed)
  Patient was told to call with her hemoglobin results which is 8.4. Patient would like to know if she should go back on her Xarelto. She would also like to know if its ok for her to travel. She is supposed to go out of town tomorrow

## 2018-09-24 NOTE — Patient Instructions (Addendum)
Medication Instructions:  Your physician recommends that you continue on your current medications as directed. Please refer to the Current Medication list given to you today.  *If you need a refill on your cardiac medications before your next appointment, please call your pharmacy.  Labwork: None ordered  Testing/Procedures: Your physician has requested that you have cardiac CT within 7 days PRIOR to your ablation. Cardiac computed tomography (CT) is a painless test that uses an x-ray machine to take clear, detailed pictures of your heart. For further information please visit [facility]. Please follow instruction below located under special instructions.  Your physician has recommended that you have an ablation. Catheter ablation is a medical procedure used to treat some cardiac arrhythmias (irregular heartbeats). During catheter ablation, a long, thin, flexible tube is put into a blood vessel in your groin (upper thigh), or neck. This tube is called an ablation catheter. It is then guided to your heart through the blood vessel. Radio frequency waves destroy small areas of heart tissue where abnormal heartbeats may cause an arrhythmia to start. Please see the instructions below located under special instructions  The following dates are available: 11/01/18, 11/06/18, 11/08/18, 11/14/18, 11/20/18,   Follow-Up: Your physician recommends that you schedule a follow-up appointment in: 4 weeks, after your procedure on _______, with Roderic Palau NP in the AFib clinic.  Your physician recommends that you schedule a follow up appointment in: 3 months, after your procedure on _________, with Dr. Curt Bears.  *Please note that any paperwork needing to be filled out by the provider will need to be addressed at the front desk prior to seeing the provider. Please note that any FMLA, disability or other documents regarding health condition is subject to a $25.00 charge that must be received prior to completion  of paperwork in the form of a money order or check.  Thank you for choosing [facility]!! [phone]   Any Other Special Instructions Will Be Listed Below   CARDIAC CT INSTRUCTIONS:  Please arrive at the [facility] main entrance of [facility] at _____ AM (30-45 minutes prior to test start time) [facility] [place], [place] [address] [phone]  Proceed to the [facility] Radiology Department (First Floor).  Please follow these instructions carefully (unless otherwise directed):  On the Night Before the Test: . Drink plenty of water. . Do not consume any caffeinated/decaffeinated beverages or chocolate 12 hours prior to your test. . Do not take any antihistamines 12 hours prior to your test.  On the Day of the Test: . Drink plenty of water. Do not drink any water within one hour of the test. . Do not eat any food 4 hours prior to the test. . You may take your regular medications prior to the test. . MAKE SURE TO TAKE YOUR TOPROL THE DAY OF THIS TEST  After the Test: . Drink plenty of water. . After receiving IV contrast, you may experience a mild flushed feeling. This is normal. . On occasion, you may experience a mild rash up to 24 hours after the test. This is not dangerous. If this occurs, you can take Benadryl 25 mg and increase your fluid intake. . If you experience trouble breathing, this can be serious. If it is severe call 911 IMMEDIATELY. If it is mild, please call our office.    Instructions for your ablation: 1. Please arrive at the [facility], Main Entrance "A", of [facility] at ______A.M. on ________  2. Do not eat or drink after midnight the night prior to the procedure. 3. Do not miss any doses of XARELTO prior to the morning of the procedure.  4. Do not take any medications the morning of the procedure. 5. Plan for an overnight stay in the hospital. 6. You will need someone to drive you home at  discharge.   Cardiac Ablation Cardiac ablation is a procedure to disable (ablate) a small amount of heart tissue in very specific places. The heart has many electrical connections. Sometimes these connections are abnormal and can cause the heart to beat very fast or irregularly. Ablating some of the problem areas can improve the heart rhythm or return it to normal. Ablation may be done for people who:  Have Wolff-Parkinson-White syndrome.  Have fast heart rhythms (tachycardia).  Have taken medicines for an abnormal heart rhythm (arrhythmia) that were not effective or caused side effects.  Have a high-risk heartbeat that may be life-threatening.  During the procedure, a small incision is made in the neck or the groin, and a long, thin, flexible tube (catheter) is inserted into the incision and moved to the heart. Small devices (electrodes) on the tip of the catheter will send out electrical currents. A type of X-ray (fluoroscopy) will be used to help guide the catheter and to provide images of the heart. Tell a health care provider about:  Any allergies you have.  All medicines you are taking, including vitamins, herbs, eye drops, creams, and over-the-counter medicines.  Any problems you or family members have had with anesthetic medicines.  Any blood disorders you have.  Any surgeries you have had.  Any medical conditions you have, such as kidney failure.  Whether you are pregnant or may be pregnant. What are the risks? Generally, this is a safe procedure. However, problems may occur, including:  Infection.  Bruising and bleeding at the catheter insertion site.  Bleeding into the chest, especially into the sac that surrounds the heart. This is a serious complication.  Stroke or blood clots.  Damage to other structures or organs.  Allergic reaction to medicines or dyes.  Need for a permanent pacemaker if the normal electrical system is damaged. A pacemaker is a small  computer that sends electrical signals to the heart and helps your heart beat normally.  The procedure not being fully effective. This may not be recognized until months later. Repeat ablation procedures are sometimes required.  What happens before the procedure?  Follow instructions from your health care provider about eating or drinking restrictions.  Ask your health care provider about: ? Changing or stopping your regular medicines. This is especially important if you are taking diabetes medicines or blood thinners. ? Taking medicines such as aspirin and ibuprofen. These medicines can thin your blood. Do not take these medicines before your procedure if your health care provider instructs you not to.  Plan to have someone take you home from the hospital or clinic.  If you will be going home right after the procedure, plan to have someone with you for 24 hours. What happens during the procedure?  To lower your risk of infection: ? Your health care team will wash or sanitize their hands. ? Your skin will be washed with soap. ? Hair may be removed from the incision area.  An IV tube will be inserted into one of your veins.  You will be given a medicine to help you relax (sedative).  The skin on your neck or groin  will be numbed.  An incision will be made in your neck or your groin.  A needle will be inserted through the incision and into a large vein in your neck or groin.  A catheter will be inserted into the needle and moved to your heart.  Dye may be injected through the catheter to help your surgeon see the area of the heart that needs treatment.  Electrical currents will be sent from the catheter to ablate heart tissue in desired areas. There are three types of energy that may be used to ablate heart tissue: ? Heat (radiofrequency energy). ? Laser energy. ? Extreme cold (cryoablation).  When the necessary tissue has been ablated, the catheter will be removed.  Pressure  will be held on the catheter insertion area to prevent excessive bleeding.  A bandage (dressing) will be placed over the catheter insertion area. The procedure may vary among health care providers and hospitals. What happens after the procedure?  Your blood pressure, heart rate, breathing rate, and blood oxygen level will be monitored until the medicines you were given have worn off.  Your catheter insertion area will be monitored for bleeding. You will need to lie still for a few hours to ensure that you do not bleed from the catheter insertion area.  Do not drive for 24 hours or as long as directed by your health care provider. Summary  Cardiac ablation is a procedure to disable (ablate) a small amount of heart tissue in very specific places. Ablating some of the problem areas can improve the heart rhythm or return it to normal.  During the procedure, electrical currents will be sent from the catheter to ablate heart tissue in desired areas. This information is not intended to replace advice given to you by your health care provider. Make sure you discuss any questions you have with your health care provider. Document Released: 02/25/2009 Document Revised: 08/28/2016 Document Reviewed: 08/28/2016 Elsevier Interactive Patient Education  [employer].

## 2018-09-24 NOTE — Telephone Encounter (Signed)
Dr. Curt Bears saw. Agree with trying Eliquis. He states that he will check with her GI. FYI [name], MD

## 2018-09-25 NOTE — Telephone Encounter (Signed)
    Jerline Pain, MD  Physician  Cardiology  Telephone Encounter  Signed  Encounter Date:  09/19/2018          Signed         Dr. Curt Bears saw. Agree with trying Eliquis. He states that he will check with her GI. FYI [name], MD          Sherri - Just FYI

## 2018-09-26 DIAGNOSIS — G4733 Obstructive sleep apnea (adult) (pediatric): Secondary | ICD-10-CM | POA: Diagnosis not present

## 2018-09-27 NOTE — Telephone Encounter (Signed)
Pt called to talk about possibly restarting the Xarelto.. I advised her that it is noted that we are considering starting her on Eliquis but needed to message Dr. Curt Bears for his order when to start.. Her last HGB was this past Tuesday 09/24/18 and it had only improved to 8.4.. No official follow up has been made with Dr. Halford Chessman.. Pt is in [place] until the first week in January.  Pt reports that she has been off of the Xarelto for 1 week.

## 2018-09-27 NOTE — Telephone Encounter (Signed)
LMTCB

## 2018-09-27 NOTE — Telephone Encounter (Addendum)
Spoke with Dr. Caryl Comes (DOD) since Dr. Curt Bears is in procedures today and after reviewing the pts chart will wait to make any decsions re: meds until after hearing back from Dr. Curt Bears.

## 2018-09-27 NOTE — Telephone Encounter (Signed)
Follow Up:      Pt is returning Erika Kim's call from 09-24-18.

## 2018-09-27 NOTE — Telephone Encounter (Signed)
Pts GI MD is Dr. Cristina Gong note stated Dr. Halford Chessman in error pt says she made a mistake.Marland Kitchen

## 2018-09-30 ENCOUNTER — Telehealth: Payer: Self-pay | Admitting: Cardiology

## 2018-09-30 NOTE — Telephone Encounter (Signed)
Informed pt that Dr. Curt Bears is waiting on GI to let us know if ok to resume Xarelto. Pt understands I will call her once we hear from Dr. Cristina Gong.  Pt aware Camnitz sent him a message this morning in relation to this. She is aware I will contact her once GI has gotten back with [place]. She is agreeable to plan.

## 2018-09-30 NOTE — Telephone Encounter (Signed)
Please see telephone note dated 09/24/2018

## 2018-09-30 NOTE — Telephone Encounter (Signed)
  Patient is calling to see when her CT will be for her ablasion and she needs to know when she should go back on her Xarelto

## 2018-10-01 DIAGNOSIS — R69 Illness, unspecified: Secondary | ICD-10-CM | POA: Diagnosis not present

## 2018-10-01 NOTE — Telephone Encounter (Signed)
Pt aware that we are waiting to speak with Dr. Cristina Gong.  Informed that Dr. Curt Bears sent him a message yesterday morning in reference to this.  She is aware I will call her once he and GI have consulted w/ each other.

## 2018-10-04 ENCOUNTER — Telehealth: Payer: Self-pay | Admitting: Cardiology

## 2018-10-04 NOTE — Telephone Encounter (Signed)
Left message w/ Dr. Buccini's office to let us know whether pt can restart her blood thinner.

## 2018-10-04 NOTE — Telephone Encounter (Signed)
Informed pt that we still have not heard from GI.  Explained that I would reach out to their office about this matter, again. She understands I will follow up w/ her after we speak w/ them.

## 2018-10-04 NOTE — Telephone Encounter (Signed)
  Patient would like to know when she can go back on her Xarelto.

## 2018-10-04 NOTE — Telephone Encounter (Signed)
Would ask Dr. Curt Bears to page me whenever convenient to discuss this pt's case in more detail.  Pager [phone].    In the meantime, please review my note on Epic from 09/18/18.    I think resumption of anticoagulation in this patient will lead to gradual bleeding, probably requiring some degree of transfusion support.  However, anticoagulation would not likely lead to brisk, acutely destabilizing bleeding and therefore, if necessary, could be undertaken as long as there is appropriate close monitoring of hemoglobin.  Cleotis Nipper, M.D. Pager [phone] If no answer or after 5 PM call [phone]

## 2018-10-07 NOTE — Telephone Encounter (Signed)
Dr. Osborn Coho office called and stated Dr. Baird Kay can reach him on his beeper to discuss any concerns @ [phone].

## 2018-10-08 ENCOUNTER — Telehealth: Payer: Self-pay | Admitting: *Deleted

## 2018-10-08 NOTE — Telephone Encounter (Signed)
lmtcb  (per Dr. Curt Bears:  Pt will need close monitoring after restarting blood thinner.  Recommendation to change pt to Eliquis.  Hbg also needs to see improvement before scheduling procedure.  Recommends 3 mo f/u and then addressing ablation possibility)

## 2018-10-09 NOTE — Telephone Encounter (Signed)
Pt returned my call from yesterday.   Informed pt that Dr. Curt Bears and Dr. Cristina Gong spoke.  Explained situation to patient and rationale of needing to hold off on procedure for the moment.  Pt aware that her Hgb needs to continue to increase and stabilize before proceeding w/ procedure.   Pt aware that we are going to switch her to another blood thinner called Eliquis when it is time to restart. Pt leaving for a cruise tomorrow and will return right before christmas.  Pt aware I will follow up with her after her return and we will schedule her for blood work and follow up w/ Dr. Curt Bears. Pt is agreeable to plan.

## 2018-10-10 NOTE — Telephone Encounter (Signed)
Late Entry: Spoke to patient yesterday.  Informed her of close monitoring that is needed for bld thinner, changing to Eliquis, and Hgb improvement needed prior to procedure.   We agreed to speak end of yr/beginning of next yr to arrange treatment plan, as pt is leaving for a cruise tomorrow.

## 2018-10-21 ENCOUNTER — Telehealth: Payer: Self-pay | Admitting: Cardiology

## 2018-10-21 NOTE — Telephone Encounter (Signed)
New message   Pt c/o medication issue:  1. Name of Medication: Eliquis  2. How are you currently taking this medication (dosage and times per day)? No   3. Are you having a reaction (difficulty breathing--STAT)?no   4. What is your medication issue? Patient wants to know when this medication will be called in to [facility] in [place], [place]. Patient has questions about having lab work done in [place]. Please cal to discuss.

## 2018-10-21 NOTE — Telephone Encounter (Signed)
Pt reminded that per our conversation last week, we were not restarting the blood thinner until follow up Hgb is drawn to make sure it is safe to restart.   Pt is currently still in [place].  States that she may stay till March depending on what we are going to do treatment wise.  She is asking if she can have blood work done at an Urgent Care in [facility].  Advised that if they are willing to draw, then I will call them at the end of the week for result. Pt understands I will get lab result, discuss plan w/ Camnitz and call her w/ advisement. Patient verbalized understanding and agreeable to plan.

## 2018-10-24 NOTE — Telephone Encounter (Signed)
° °  Patient requesting lab order be sent to : Urgent Care Fax # [phone] Phone [phone]

## 2018-10-25 NOTE — Telephone Encounter (Signed)
Pt informed request being faxed now and to have blood work next week.  Aware I will follow up once lab result reviewed.

## 2018-10-28 DIAGNOSIS — D582 Other hemoglobinopathies: Secondary | ICD-10-CM | POA: Diagnosis not present

## 2018-10-30 MED ORDER — RIVAROXABAN 20 MG PO TABS: 20.0000 mg | ORAL_TABLET | Freq: Every day | ORAL | 3 refills | Status: DC

## 2018-10-30 NOTE — Telephone Encounter (Signed)
Received lab result -  Hgb 14.1. Pt advised to restart Xarelto 20 mg once daily. Informed that I would discuss next step in plan of care w/ Camnitz and call her w/i next week w/ recommendation. Patient verbalized understanding and agreeable to plan.

## 2018-10-30 NOTE — Addendum Note (Signed)
Addended by: Stanton Kidney on: 10/30/2018 06:19 PM   Modules accepted: Orders

## 2018-11-01 NOTE — Telephone Encounter (Signed)
Explained to pt Dr. Curt Bears would like follow up Hgb lab work in several weeks. She is agreeable to plan.  She is currently still in [facility].  She will get place/fax to send order.

## 2018-11-05 ENCOUNTER — Telehealth: Payer: Self-pay | Admitting: Cardiology

## 2018-11-05 NOTE — Telephone Encounter (Signed)
New Message         Patient is calling today to give a fax # [phone] to  [facility] in [place], patient states she was told she had to get another CBC drawn. Pls advise.

## 2018-11-07 NOTE — Telephone Encounter (Signed)
Left detailed message I would fax order today.  Advised to have lab drawn next week.

## 2018-11-13 DIAGNOSIS — I480 Paroxysmal atrial fibrillation: Secondary | ICD-10-CM | POA: Diagnosis not present

## 2018-11-13 DIAGNOSIS — Z5181 Encounter for therapeutic drug level monitoring: Secondary | ICD-10-CM | POA: Diagnosis not present

## 2018-11-21 ENCOUNTER — Telehealth: Payer: Self-pay | Admitting: Cardiology

## 2018-11-21 DIAGNOSIS — I480 Paroxysmal atrial fibrillation: Secondary | ICD-10-CM

## 2018-11-21 DIAGNOSIS — Z79899 Other long term (current) drug therapy: Secondary | ICD-10-CM

## 2018-11-21 NOTE — Telephone Encounter (Signed)
New Message           Patient is calling to let Dr. Curt Bears know that she has taken her hemoglobin lab test and she would like the results. [facility] in [place]. [phone] Patient would like for someone to call to see if we can get the lab results.

## 2018-11-21 NOTE — Telephone Encounter (Signed)
Informed pt that I received lab result today. Informed that her Hgb is normal at 14.7  (was 14.1 on 10/28/18). Explained that she will need f/u blood work in about a month.  Pt states that she will be back in [place] first of march and wonders if ok to have redrawn when she returns.  Informed that I would discuss w/ Camnitz and let her know.  She and I agreed to speak in the next several week to arrange f/u lab work and schedule OV w/ [place]. Patient verbalized understanding and agreeable to plan.

## 2018-11-22 NOTE — Telephone Encounter (Signed)
lmtcb

## 2018-11-25 NOTE — Addendum Note (Signed)
Addended by: Stanton Kidney on: 11/25/2018 10:46 AM   Modules accepted: Orders

## 2018-11-25 NOTE — Telephone Encounter (Signed)
Follow up  ° ° °Pt returning call  °

## 2018-11-25 NOTE — Telephone Encounter (Signed)
Pt scheduled for follow up blood work on 3/9 (BMET).  She understands she does not need to be fasting for this blood work. Pt scheduled to discuss ablation on 3/24. Ablation spot held for 4/7. Patient verbalized understanding and agreeable to plan.

## 2018-12-26 DIAGNOSIS — G4733 Obstructive sleep apnea (adult) (pediatric): Secondary | ICD-10-CM | POA: Diagnosis not present

## 2018-12-30 ENCOUNTER — Other Ambulatory Visit: Payer: Medicare HMO | Admitting: *Deleted

## 2018-12-30 DIAGNOSIS — I480 Paroxysmal atrial fibrillation: Secondary | ICD-10-CM

## 2018-12-30 DIAGNOSIS — Z79899 Other long term (current) drug therapy: Secondary | ICD-10-CM

## 2018-12-30 LAB — CBC
Hematocrit: 40.3 % (ref 34.0–46.6)
Hemoglobin: 13.8 g/dL (ref 11.1–15.9)
MCH: 29 pg (ref 26.6–33.0)
MCHC: 34.2 g/dL (ref 31.5–35.7)
MCV: 85 fL (ref 79–97)
PLATELETS: 149 10*3/uL — AB (ref 150–450)
RBC: 4.76 x10E6/uL (ref 3.77–5.28)
RDW: 12.4 % (ref 11.7–15.4)
WBC: 6 10*3/uL (ref 3.4–10.8)

## 2019-01-03 ENCOUNTER — Encounter: Payer: Self-pay | Admitting: Cardiology

## 2019-01-08 DIAGNOSIS — R69 Illness, unspecified: Secondary | ICD-10-CM | POA: Diagnosis not present

## 2019-01-13 ENCOUNTER — Other Ambulatory Visit: Payer: Self-pay | Admitting: Cardiology

## 2019-01-13 MED ORDER — RIVAROXABAN 20 MG PO TABS: ORAL_TABLET | ORAL | 1 refills | Status: DC

## 2019-01-13 NOTE — Telephone Encounter (Signed)
Pt aware office will call when ablation can be rescheduled.

## 2019-01-14 ENCOUNTER — Ambulatory Visit: Payer: Medicare HMO | Admitting: Cardiology

## 2019-02-24 DIAGNOSIS — Z1331 Encounter for screening for depression: Secondary | ICD-10-CM | POA: Diagnosis not present

## 2019-02-24 DIAGNOSIS — E1151 Type 2 diabetes mellitus with diabetic peripheral angiopathy without gangrene: Secondary | ICD-10-CM | POA: Diagnosis not present

## 2019-02-24 DIAGNOSIS — Z0000 Encounter for general adult medical examination without abnormal findings: Secondary | ICD-10-CM | POA: Insufficient documentation

## 2019-02-24 DIAGNOSIS — D62 Acute posthemorrhagic anemia: Secondary | ICD-10-CM | POA: Diagnosis not present

## 2019-02-24 DIAGNOSIS — M25562 Pain in left knee: Secondary | ICD-10-CM | POA: Insufficient documentation

## 2019-02-24 DIAGNOSIS — Z7901 Long term (current) use of anticoagulants: Secondary | ICD-10-CM | POA: Diagnosis not present

## 2019-02-24 DIAGNOSIS — I480 Paroxysmal atrial fibrillation: Secondary | ICD-10-CM | POA: Diagnosis not present

## 2019-02-24 DIAGNOSIS — G4733 Obstructive sleep apnea (adult) (pediatric): Secondary | ICD-10-CM | POA: Diagnosis not present

## 2019-02-25 DIAGNOSIS — Z0100 Encounter for examination of eyes and vision without abnormal findings: Secondary | ICD-10-CM | POA: Diagnosis not present

## 2019-03-13 ENCOUNTER — Encounter: Payer: Self-pay | Admitting: Podiatry

## 2019-03-13 ENCOUNTER — Other Ambulatory Visit: Payer: Self-pay

## 2019-03-13 ENCOUNTER — Ambulatory Visit: Payer: Managed Care, Other (non HMO) | Admitting: Podiatry

## 2019-03-13 DIAGNOSIS — B351 Tinea unguium: Secondary | ICD-10-CM

## 2019-03-13 DIAGNOSIS — M778 Other enthesopathies, not elsewhere classified: Secondary | ICD-10-CM

## 2019-03-13 DIAGNOSIS — M79676 Pain in unspecified toe(s): Secondary | ICD-10-CM | POA: Diagnosis not present

## 2019-03-13 DIAGNOSIS — M779 Enthesopathy, unspecified: Secondary | ICD-10-CM | POA: Diagnosis not present

## 2019-03-13 DIAGNOSIS — E119 Type 2 diabetes mellitus without complications: Secondary | ICD-10-CM

## 2019-03-13 DIAGNOSIS — D689 Coagulation defect, unspecified: Secondary | ICD-10-CM

## 2019-03-13 NOTE — Progress Notes (Signed)
She presents today for follow-up of her arthritis and capsulitis to the dorsal aspect of her right foot states that is hurting when she stands on it otherwise is not too painful.  States that her latest hemoglobin A1c was at 5.3 but has recently had a GI bleed and was in the hospital requiring multiple pints of blood.  Most likely secondary to her blood thinner issues chronically.  Objective: Vital signs are stable she is alert and oriented x3 toenails are long thick yellow dystrophic clinically mycotic with superficial white onychomycosis.  Sharply incurvated nail margins are tender on palpation.  Pulses are strong and palpable neurologic sensorium appears to be intact.  She has a thick midfoot bilaterally secondary to hypertrophy of the osteoarthritic changes along Lisfranc's joints.  Right foot is most sensitive painful on frontal plane range of motion as well as sagittal plane range of motion.  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  Pain in limb secondary to onychomycosis.  Plan: Discussed etiology pathology conservative versus surgical therapies.  At this point debrided toenails 1 through 5 bilateral.  Also injected the dorsal aspect of the right foot with 10 mg of Kenalog 5 mg Marcaine to the point of maximal tenderness.  This help alleviate her symptoms that she was leaving the building and will follow-up with me in 6 months or sooner if needed.

## 2019-03-15 ENCOUNTER — Other Ambulatory Visit: Payer: Self-pay | Admitting: Cardiology

## 2019-03-19 DIAGNOSIS — M1711 Unilateral primary osteoarthritis, right knee: Secondary | ICD-10-CM | POA: Diagnosis not present

## 2019-03-28 DIAGNOSIS — G4733 Obstructive sleep apnea (adult) (pediatric): Secondary | ICD-10-CM | POA: Diagnosis not present

## 2019-03-31 DIAGNOSIS — L821 Other seborrheic keratosis: Secondary | ICD-10-CM | POA: Diagnosis not present

## 2019-03-31 DIAGNOSIS — L570 Actinic keratosis: Secondary | ICD-10-CM | POA: Diagnosis not present

## 2019-03-31 DIAGNOSIS — D1801 Hemangioma of skin and subcutaneous tissue: Secondary | ICD-10-CM | POA: Diagnosis not present

## 2019-03-31 DIAGNOSIS — L819 Disorder of pigmentation, unspecified: Secondary | ICD-10-CM | POA: Diagnosis not present

## 2019-03-31 DIAGNOSIS — L814 Other melanin hyperpigmentation: Secondary | ICD-10-CM | POA: Diagnosis not present

## 2019-03-31 DIAGNOSIS — D229 Melanocytic nevi, unspecified: Secondary | ICD-10-CM | POA: Diagnosis not present

## 2019-04-02 ENCOUNTER — Telehealth: Payer: Self-pay | Admitting: Cardiology

## 2019-04-02 NOTE — Telephone Encounter (Signed)
Patient is calling wanting to know if she can schedule her ablation now. She it was canceled before because of COVID.

## 2019-04-03 NOTE — Telephone Encounter (Signed)
Pt scheduled for OV next Tuesday to discuss. COVID screening completed for in-office visit. Patient verbalized understanding and agreeable to plan.       COVID-19 Pre-Screening Questions:  . In the past 7 to 10 days have you had a cough,  shortness of breath, headache, congestion, fever (100 or greater) body aches, chills, sore throat, or sudden loss of taste or sense of smell? NO . Have you been around anyone with known Covid 19. NO . Have you been around anyone who is awaiting Covid 19 test results in the past 7 to 10 days? NO . Have you been around anyone who has been exposed to Covid 19, or has mentioned symptoms of Covid 19 within the past 7 to 10 days? NO  If you have any concerns/questions about symptoms patients report during screening (either on the phone or at threshold). Contact the provider seeing the patient or DOD for further guidance.  If neither are available contact a member of the leadership team.

## 2019-04-08 ENCOUNTER — Encounter: Payer: Self-pay | Admitting: Cardiology

## 2019-04-08 ENCOUNTER — Other Ambulatory Visit: Payer: Self-pay

## 2019-04-08 ENCOUNTER — Ambulatory Visit: Payer: Medicare HMO | Admitting: Cardiology

## 2019-04-08 VITALS — BP 126/82 | HR 64 | Ht 63.0 in | Wt 214.0 lb

## 2019-04-08 DIAGNOSIS — I480 Paroxysmal atrial fibrillation: Secondary | ICD-10-CM

## 2019-04-08 NOTE — Progress Notes (Signed)
Electrophysiology Office Note   Date:  04/08/2019   ID:  Adda, Stokes 12/12/1945, MRN [id number]  PCP:  [name], MD  Cardiologist:  Marlou Porch Primary Electrophysiologist: Allegra Lai, MD  No chief complaint on file.    History of Present Illness: Erika Kim is a 73 y.o. female who is being seen today for the evaluation of atrial fibrillation at the request of [name]. Presenting today for electrophysiology evaluation.  She has a history of hypertension, hyperlipidemia, paroxysmal atrial fibrillation, diabetes, and OSA on CPAP.  She has been having occasional episodes of atrial fibrillation that is increased despite starting flecainide.  She has had been having side effects to flecainide.  She was recently admitted to the hospital with symptomatic anemia and melanotic stools.  Her Xarelto was stopped.  She had a capsule endoscopy which showed oozing blood from multiple low-grade lesions.  Continue to have short episodes of atrial fibrillation and is thus interested in ablation.  She presents today for follow up. Have been unable to proceed with ablation prior to this appointment due to COVID restrictions. She denies symptoms of palpitations, chest pain, shortness of breath, orthopnea, PND, lower extremity edema, claudication, dizziness, presyncope, syncope, bleeding, or neurologic sequela. The patient is tolerating medications without difficulties.     Past Medical History:  Diagnosis Date  . Acoustic neuroma ([place]) followed by dr brown at [facility]   right side w/ chronic roaring noise  . Anticoagulant long-term use    xarelto  . Benign paroxysmal positional vertigo   . Endometrial polyp   . Frequency of urination   . Hyperlipidemia   . Hypertension   . OA (osteoarthritis)    left knee  . OSA on CPAP   . PAF (paroxysmal atrial fibrillation) [facility]) cardiologist-  dr Marlou Porch   dx 04/ 2017  . PMB (postmenopausal bleeding)   . TMJ (temporomandibular joint  disorder)    left side-- wears guard  . Type 2 diabetes mellitus ([place])   . Wears contact lenses    Past Surgical History:  Procedure Laterality Date  . CARPAL TUNNEL RELEASE Right 04/17/2007   w/ Excision ganglion cyst and Pulley Release right thumb  . DILATATION & CURETTAGE/HYSTEROSCOPY WITH MYOSURE N/A 01/12/2017   Procedure: DILATATION & CURETTAGE/HYSTEROSCOPY WITH MYOSURE;  Surgeon: Arvella Nigh, MD;  Location: [place];  Service: Gynecology;  Laterality: N/A;  . ESOPHAGOGASTRODUODENOSCOPY (EGD) WITH PROPOFOL N/A 09/16/2018   Procedure: ESOPHAGOGASTRODUODENOSCOPY (EGD) WITH PROPOFOL;  Surgeon: Ronald Lobo, MD;  Location: [place];  Service: Endoscopy;  Laterality: N/A;  . GIVENS CAPSULE STUDY N/A 09/16/2018   Procedure: GIVENS CAPSULE STUDY;  Surgeon: Ronald Lobo, MD;  Location: [place];  Service: Endoscopy;  Laterality: N/A;  . HAMMER TOE SURGERY Left 2010   left second toe  . KNEE ARTHROSCOPY Right 2010  . KNEE SURGERY Left 1993  . LYMPH NODE BIOPSY N/A 03/01/2017   Procedure: SENTINEL LYMPH NODE BIOPSY;  Surgeon: Everitt Amber, MD;  Location: [facility];  Service: Gynecology;  Laterality: N/A;  . NASAL SINUS SURGERY  1985 and 1995  . ROBOTIC ASSISTED TOTAL HYSTERECTOMY WITH BILATERAL SALPINGO OOPHERECTOMY Bilateral 03/01/2017   Procedure: XI ROBOTIC ASSISTED TOTAL HYSTERECTOMY WITH BILATERAL SALPINGO OOPHORECTOMY;  Surgeon: Everitt Amber, MD;  Location[facility];  Service: Gynecology;  Laterality: Bilateral;  . ROTATOR CUFF REPAIR Right 04/2004  . TOE SURGERY Right 2012   Great toe and second toe  . TOTAL ELBOW REPLACEMENT Right 2000  . TOTAL KNEE  ARTHROPLASTY Right 01/12/2010  . TRANSTHORACIC ECHOCARDIOGRAM  03/28/2016   ef 60-65%/ mild AR and MR  . TUBAL LIGATION Bilateral 1978     Current Outpatient Medications  Medication Sig Dispense Refill  . atorvastatin (LIPITOR) 40 MG tablet Take 40 mg by mouth every morning.     . betamethasone valerate  (VALISONE) 0.1 % cream APPLY TO AFFECTED AREA SPARINGLY ONCE A DAY    . Cholecalciferol (VITAMIN D3) 5000 units TABS Take 5,000 Units by mouth daily.     . flecainide (TAMBOCOR) 50 MG tablet TAKE ONE TABLET BY MOUTH TWICE DAILY  180 tablet 1  . JANUVIA 100 MG tablet Take 100 mg by mouth daily after supper.     . Lancets (ONETOUCH DELICA PLUS [id number]) [place]     . lisinopril-hydrochlorothiazide (PRINZIDE,ZESTORETIC) 20-12.5 MG per tablet Take 1 tablet by mouth every morning.     . metoprolol succinate (TOPROL-XL) 50 MG 24 hr tablet Take 50 mg by mouth 2 (two) times daily. May take a third time if heart goes into a-fib    . Multiple Vitamin (MULTIVITAMIN) tablet Take 1 tablet by mouth daily.    . ONE TOUCH ULTRA TEST test strip     . Polyethyl Glycol-Propyl Glycol (SYSTANE OP) Place 1 drop into both eyes daily as needed (dry eyes).    . rivaroxaban (XARELTO) 20 MG TABS tablet TAKE ONE TABLET BY MOUTH ONE TIME DAILY WITH SUPPER 90 tablet 1  . saline (AYR) GEL Place 1 application into the nose at bedtime.     No current facility-administered medications for this visit.     Allergies:   Cephalosporins and Augmentin [amoxicillin-pot clavulanate]   Social History:  The patient  reports that she has never smoked. She has never used smokeless tobacco. She reports that she does not drink alcohol or use drugs.   Family History:  The patient's family history includes Heart failure in her father and mother.   Review of systems complete and found to be negative unless listed in HPI.     Physical Exam: Vitals:   04/08/19 1054  BP: 126/82  Pulse: 64  Weight: 214 lb (97.1 kg)  Height: 5\' 3"  (1.6 m)    GEN- The patient is well appearing, alert and oriented x 3 today.   Head- normocephalic, atraumatic Eyes-  Sclera clear, conjunctiva pink Ears- hearing intact Oropharynx- clear Neck- supple,  Lungs- Clear to ausculation bilaterally, normal work of breathing Heart- Regular rate and rhythm, no  murmurs, rubs or gallops, PMI not laterally displaced GI- soft, NT, ND, + BS Extremities- no clubbing, cyanosis, or edema Neuro- strength and sensation are intact  EKG:  EKG is ordered today. Personal review of the ekg shows sinus rhythm, rate 64  Recent Labs: 09/16/2018: ALT 20 09/18/2018: BUN 17; Creatinine, Ser 0.80; Potassium 3.9; Sodium 138 12/30/2018: Hemoglobin 13.8; Platelets 149    Lipid Panel  No results found for: CHOL, TRIG, HDL, CHOLHDL, VLDL, LDLCALC, LDLDIRECT   Wt Readings from Last 3 Encounters:  04/08/19 214 lb (97.1 kg)  09/24/18 207 lb 6.4 oz (94.1 kg)  09/15/18 200 lb (90.7 kg)    Other studies Reviewed: Additional studies/ records that were reviewed today include: TTE 2017  Review of the above records today demonstrates:  - Left ventricle: The cavity size was normal. Systolic function was   normal. The estimated ejection fraction was in the range of 60%   to 65%. Wall motion was normal; there were no regional wall  motion abnormalities. The transmitral flow pattern was normal.   Left ventricular diastolic function parameters were normal. - Aortic valve: Moderate focal calcification, consistent with   sclerosis. There was mild regurgitation. - Mitral valve: There was mild regurgitation.   ASSESSMENT AND PLAN:  1.  Paroxysmal atrial fibrillation: Currently on Xarelto and flecainide.  Flecainide is no longer controlling her symptoms. Have discussed switch to Eliquis if she can tolerate due to her history of GI bleeding. She follows with GI. Have previously discussed risk and benefits of ablation including, but not limited to bleeding, tamponade, heart block, stroke, damage to surrounding organs.  She understands these risks and has agreed to the procedure.  This patients CHA2DS2-VASc Score and unadjusted Ischemic Stroke Rate (% per year) is equal to 4.8 % stroke rate/year from a score of 4  Above score calculated as 1 point each if present [CHF, HTN, DM,  Vascular=MI/PAD/Aortic Plaque, Age if 65-74, or Female] Above score calculated as 2 points each if present [Age > 75, or Stroke/TIA/TE]  2.  Obstructive sleep apnea: Encouraged nightly CPAP use.   3.  Hypertension: Controlled on current medications.   4.  Hyperlipidemia: Continue atorvastatin.   5.  Morbid obesity: Encouraged weight loss via exercise and diet.   Current medicines are reviewed at length with the patient today.   The patient does not have concerns regarding her medicines.  The following changes were made today:  none  Labs/ tests ordered today include:  Orders Placed This Encounter  Procedures  . EKG 12-Lead    Disposition:   FU with Will Camnitz 3 months  Signed, Will Meredith Leeds, MD  04/08/2019 11:07 AM     [facility] [address] [place] [address] [phone] (office) [phone] (fax)

## 2019-04-22 DIAGNOSIS — R69 Illness, unspecified: Secondary | ICD-10-CM | POA: Diagnosis not present

## 2019-04-23 ENCOUNTER — Telehealth: Payer: Self-pay | Admitting: *Deleted

## 2019-04-23 DIAGNOSIS — I480 Paroxysmal atrial fibrillation: Secondary | ICD-10-CM

## 2019-04-23 DIAGNOSIS — Z01812 Encounter for preprocedural laboratory examination: Secondary | ICD-10-CM

## 2019-04-23 NOTE — Telephone Encounter (Signed)
Pt agreeable to ablation on 8/6 Pt aware I will schedule and call her next week to review instructions. She understands office will contact her to arrange CT testing.

## 2019-04-23 NOTE — Telephone Encounter (Signed)
Follow up   Patient is calling per the previous message to see when ablation can be scheduled. Please call.

## 2019-05-07 NOTE — Telephone Encounter (Signed)
Procedure instructions reviewed w/ pt and sent via MyChart. COVID instructions reviewed and screening scheduled for 8/3. Aware office will call to arrange cardiac CT and post procedure f/u appts. Patient verbalized understanding and agreeable to plan.

## 2019-05-16 ENCOUNTER — Other Ambulatory Visit: Payer: Medicare HMO | Admitting: *Deleted

## 2019-05-16 ENCOUNTER — Other Ambulatory Visit: Payer: Self-pay

## 2019-05-16 DIAGNOSIS — I480 Paroxysmal atrial fibrillation: Secondary | ICD-10-CM

## 2019-05-16 DIAGNOSIS — Z01812 Encounter for preprocedural laboratory examination: Secondary | ICD-10-CM | POA: Diagnosis not present

## 2019-05-16 LAB — CBC
Hematocrit: 43.3 % (ref 34.0–46.6)
Hemoglobin: 14.7 g/dL (ref 11.1–15.9)
MCH: 29.8 pg (ref 26.6–33.0)
MCHC: 33.9 g/dL (ref 31.5–35.7)
MCV: 88 fL (ref 79–97)
Platelets: 166 10*3/uL (ref 150–450)
RBC: 4.94 x10E6/uL (ref 3.77–5.28)
RDW: 12.1 % (ref 11.7–15.4)
WBC: 6.8 10*3/uL (ref 3.4–10.8)

## 2019-05-16 LAB — BASIC METABOLIC PANEL
BUN/Creatinine Ratio: 13 (ref 12–28)
BUN: 12 mg/dL (ref 8–27)
CO2: 26 mmol/L (ref 20–29)
Calcium: 10 mg/dL (ref 8.7–10.3)
Chloride: 100 mmol/L (ref 96–106)
Creatinine, Ser: 0.91 mg/dL (ref 0.57–1.00)
GFR calc Af Amer: 73 mL/min/{1.73_m2} (ref >59)
GFR calc non Af Amer: 64 mL/min/{1.73_m2} (ref >59)
Glucose: 134 mg/dL — ABNORMAL HIGH (ref 65–99)
Potassium: 4.6 mmol/L (ref 3.5–5.2)
Sodium: 142 mmol/L (ref 134–144)

## 2019-05-20 ENCOUNTER — Telehealth (HOSPITAL_COMMUNITY): Payer: Self-pay | Admitting: Emergency Medicine

## 2019-05-20 NOTE — Telephone Encounter (Signed)
Pt returned phone call regarding upcoming cardiac imaging study; pt verbalizes understanding of appt date/time, parking situation and where to check in, pre-test NPO status and medications ordered, and verified current allergies; name and call back number provided for further questions should they arise Marchia Bond RN [facility] [phone] office [phone] cell  Pt denies covid symptoms, verbalized understanding of visitor policy.

## 2019-05-20 NOTE — Telephone Encounter (Signed)
Left message on voicemail with name and callback number Heron Pitcock RN Navigator Cardiac Imaging [facility] [phone] Office [phone] Cell  

## 2019-05-22 ENCOUNTER — Other Ambulatory Visit: Payer: Self-pay

## 2019-05-22 ENCOUNTER — Ambulatory Visit (HOSPITAL_COMMUNITY)
Admission: RE | Admit: 2019-05-22 | Discharge: 2019-05-22 | Disposition: A | Discharge status: Home / Self Care | Payer: Medicare HMO | Source: Ambulatory Visit | Attending: Cardiology | Admitting: Cardiology

## 2019-05-22 ENCOUNTER — Ambulatory Visit (HOSPITAL_COMMUNITY): Admission: RE | Admit: 2019-05-22 | Payer: Medicare HMO | Source: Ambulatory Visit

## 2019-05-22 DIAGNOSIS — I480 Paroxysmal atrial fibrillation: Secondary | ICD-10-CM | POA: Insufficient documentation

## 2019-05-22 MED ORDER — IOHEXOL 350 MG/ML SOLN
100.0000 mL | Freq: Once | INTRAVENOUS | Status: AC | PRN
  Administered 2019-05-22: 100 mL via INTRAVENOUS

## 2019-05-26 ENCOUNTER — Other Ambulatory Visit (HOSPITAL_COMMUNITY)
Admission: RE | Admit: 2019-05-26 | Discharge: 2019-05-26 | Disposition: A | Discharge status: Home / Self Care | Payer: Medicare HMO | Source: Ambulatory Visit | Attending: Cardiology | Admitting: Cardiology

## 2019-05-26 DIAGNOSIS — Z20828 Contact with and (suspected) exposure to other viral communicable diseases: Secondary | ICD-10-CM | POA: Insufficient documentation

## 2019-05-26 DIAGNOSIS — Z01812 Encounter for preprocedural laboratory examination: Secondary | ICD-10-CM | POA: Insufficient documentation

## 2019-05-26 LAB — SARS CORONAVIRUS 2 (TAT 6-24 HRS): SARS Coronavirus 2: NEGATIVE

## 2019-05-29 ENCOUNTER — Encounter (HOSPITAL_COMMUNITY)
Admission: RE | Disposition: A | Discharge status: Home / Self Care | Payer: Medicare HMO | Source: Home / Self Care | Attending: Cardiology

## 2019-05-29 ENCOUNTER — Observation Stay (HOSPITAL_COMMUNITY)
Admission: RE | Admit: 2019-05-29 | Discharge: 2019-05-30 | Disposition: A | Discharge status: Home / Self Care | Payer: Medicare HMO | Attending: Cardiology | Admitting: Cardiology

## 2019-05-29 ENCOUNTER — Other Ambulatory Visit: Payer: Self-pay

## 2019-05-29 ENCOUNTER — Ambulatory Visit (HOSPITAL_COMMUNITY): Payer: Medicare HMO | Admitting: Certified Registered Nurse Anesthetist

## 2019-05-29 ENCOUNTER — Encounter (HOSPITAL_COMMUNITY): Payer: Self-pay | Admitting: General Practice

## 2019-05-29 DIAGNOSIS — E119 Type 2 diabetes mellitus without complications: Secondary | ICD-10-CM | POA: Insufficient documentation

## 2019-05-29 DIAGNOSIS — Z7901 Long term (current) use of anticoagulants: Secondary | ICD-10-CM | POA: Diagnosis not present

## 2019-05-29 DIAGNOSIS — G4733 Obstructive sleep apnea (adult) (pediatric): Secondary | ICD-10-CM | POA: Diagnosis not present

## 2019-05-29 DIAGNOSIS — Z881 Allergy status to other antibiotic agents status: Secondary | ICD-10-CM | POA: Diagnosis not present

## 2019-05-29 DIAGNOSIS — E785 Hyperlipidemia, unspecified: Secondary | ICD-10-CM | POA: Diagnosis not present

## 2019-05-29 DIAGNOSIS — Z6841 Body Mass Index (BMI) 40.0 and over, adult: Secondary | ICD-10-CM | POA: Insufficient documentation

## 2019-05-29 DIAGNOSIS — Z7984 Long term (current) use of oral hypoglycemic drugs: Secondary | ICD-10-CM | POA: Insufficient documentation

## 2019-05-29 DIAGNOSIS — I480 Paroxysmal atrial fibrillation: Principal | ICD-10-CM | POA: Insufficient documentation

## 2019-05-29 DIAGNOSIS — Z79899 Other long term (current) drug therapy: Secondary | ICD-10-CM | POA: Diagnosis not present

## 2019-05-29 DIAGNOSIS — I10 Essential (primary) hypertension: Secondary | ICD-10-CM | POA: Insufficient documentation

## 2019-05-29 DIAGNOSIS — I4891 Unspecified atrial fibrillation: Secondary | ICD-10-CM | POA: Diagnosis present

## 2019-05-29 HISTORY — PX: ATRIAL FIBRILLATION ABLATION: EP1191

## 2019-05-29 LAB — POCT ACTIVATED CLOTTING TIME: Activated Clotting Time: 136 seconds

## 2019-05-29 LAB — GLUCOSE, CAPILLARY
Glucose-Capillary: 94 mg/dL (ref 70–99)
Glucose-Capillary: 148 mg/dL — ABNORMAL HIGH (ref 70–99)
Glucose-Capillary: 223 mg/dL — ABNORMAL HIGH (ref 70–99)
Glucose-Capillary: 222 mg/dL — ABNORMAL HIGH (ref 70–99)
Glucose-Capillary: 161 mg/dL — ABNORMAL HIGH (ref 70–99)

## 2019-05-29 LAB — HEMOGLOBIN A1C
Hgb A1c MFr Bld: 5.8 % — ABNORMAL HIGH (ref 4.8–5.6)
Mean Plasma Glucose: 119.76 mg/dL

## 2019-05-29 SURGERY — ATRIAL FIBRILLATION ABLATION
Anesthesia: General

## 2019-05-29 MED ORDER — HEPARIN (PORCINE) IN NACL 1000-0.9 UT/500ML-% IV SOLN
INTRAVENOUS | Status: AC
  Filled 2019-05-29: qty 500

## 2019-05-29 MED ORDER — LIDOCAINE 2% (20 MG/ML) 5 ML SYRINGE
INTRAMUSCULAR | Status: DC | PRN
  Administered 2019-05-29: 90 mg via INTRAVENOUS

## 2019-05-29 MED ORDER — FENTANYL CITRATE (PF) 250 MCG/5ML IJ SOLN
INTRAMUSCULAR | Status: DC | PRN
  Administered 2019-05-29: 50 ug via INTRAVENOUS
  Administered 2019-05-29: 100 ug via INTRAVENOUS
  Administered 2019-05-29: 50 ug via INTRAVENOUS

## 2019-05-29 MED ORDER — ROCURONIUM BROMIDE 50 MG/5ML IV SOSY
PREFILLED_SYRINGE | INTRAVENOUS | Status: DC | PRN
  Administered 2019-05-29: 50 mg via INTRAVENOUS

## 2019-05-29 MED ORDER — BUPIVACAINE HCL (PF) 0.25 % IJ SOLN
INTRAMUSCULAR | Status: DC | PRN
  Administered 2019-05-29: 30 mL

## 2019-05-29 MED ORDER — EPHEDRINE SULFATE-NACL 50-0.9 MG/10ML-% IV SOSY
PREFILLED_SYRINGE | INTRAVENOUS | Status: DC | PRN
  Administered 2019-05-29 (×3): 5 mg via INTRAVENOUS

## 2019-05-29 MED ORDER — INSULIN ASPART 100 UNIT/ML ~~LOC~~ SOLN
0.0000 [IU] | Freq: Three times a day (TID) | SUBCUTANEOUS | Status: DC
  Administered 2019-05-29: 5 [IU] via SUBCUTANEOUS
  Administered 2019-05-30: 3 [IU] via SUBCUTANEOUS

## 2019-05-29 MED ORDER — SODIUM CHLORIDE 0.9% FLUSH: 3.0000 mL | INTRAVENOUS | Status: DC | PRN

## 2019-05-29 MED ORDER — POLYETHYL GLYCOL-PROPYL GLYCOL 0.4-0.3 % OP GEL: Freq: Every day | OPHTHALMIC | Status: DC | PRN

## 2019-05-29 MED ORDER — FENTANYL CITRATE (PF) 250 MCG/5ML IJ SOLN
INTRAMUSCULAR | Status: AC
  Filled 2019-05-29: qty 5

## 2019-05-29 MED ORDER — DOBUTAMINE IN D5W 4-5 MG/ML-% IV SOLN
INTRAVENOUS | Status: AC
  Filled 2019-05-29: qty 250

## 2019-05-29 MED ORDER — METOPROLOL SUCCINATE ER 50 MG PO TB24
50.0000 mg | ORAL_TABLET | Freq: Two times a day (BID) | ORAL | Status: DC
  Administered 2019-05-29 – 2019-05-30 (×2): 50 mg via ORAL
  Filled 2019-05-29 (×2): qty 1

## 2019-05-29 MED ORDER — HYDROCHLOROTHIAZIDE 12.5 MG PO CAPS
12.5000 mg | ORAL_CAPSULE | Freq: Every day | ORAL | Status: DC
  Administered 2019-05-29 – 2019-05-30 (×2): 12.5 mg via ORAL
  Filled 2019-05-29 (×2): qty 1

## 2019-05-29 MED ORDER — ONDANSETRON HCL 4 MG/2ML IJ SOLN
INTRAMUSCULAR | Status: DC | PRN
  Administered 2019-05-29: 4 mg via INTRAVENOUS

## 2019-05-29 MED ORDER — SODIUM CHLORIDE 0.9 % IV SOLN
INTRAVENOUS | Status: DC | PRN
  Administered 2019-05-29: 09:00:00 20 ug/min via INTRAVENOUS

## 2019-05-29 MED ORDER — PROPOFOL 10 MG/ML IV BOLUS
INTRAVENOUS | Status: DC | PRN
  Administered 2019-05-29: 130 mg via INTRAVENOUS

## 2019-05-29 MED ORDER — PHENYLEPHRINE 40 MCG/ML (10ML) SYRINGE FOR IV PUSH (FOR BLOOD PRESSURE SUPPORT)
PREFILLED_SYRINGE | INTRAVENOUS | Status: DC | PRN
  Administered 2019-05-29 (×2): 80 ug via INTRAVENOUS

## 2019-05-29 MED ORDER — RIVAROXABAN 20 MG PO TABS
20.0000 mg | ORAL_TABLET | Freq: Every day | ORAL | Status: DC
  Administered 2019-05-29: 20 mg via ORAL
  Filled 2019-05-29: qty 1

## 2019-05-29 MED ORDER — DOBUTAMINE IN D5W 4-5 MG/ML-% IV SOLN
INTRAVENOUS | Status: DC | PRN
  Administered 2019-05-29: 20 ug/kg/min via INTRAVENOUS

## 2019-05-29 MED ORDER — ATORVASTATIN CALCIUM 40 MG PO TABS
40.0000 mg | ORAL_TABLET | Freq: Every morning | ORAL | Status: DC
  Administered 2019-05-30: 40 mg via ORAL
  Filled 2019-05-29: qty 1

## 2019-05-29 MED ORDER — SODIUM CHLORIDE 0.9% FLUSH
3.0000 mL | Freq: Two times a day (BID) | INTRAVENOUS | Status: DC
  Administered 2019-05-29 – 2019-05-30 (×2): 3 mL via INTRAVENOUS

## 2019-05-29 MED ORDER — ACETAMINOPHEN 500 MG PO TABS: 1000.0000 mg | ORAL_TABLET | Freq: Three times a day (TID) | ORAL | Status: DC | PRN

## 2019-05-29 MED ORDER — SUGAMMADEX SODIUM 200 MG/2ML IV SOLN
INTRAVENOUS | Status: DC | PRN
  Administered 2019-05-29: 200 mg via INTRAVENOUS

## 2019-05-29 MED ORDER — DEXAMETHASONE SODIUM PHOSPHATE 10 MG/ML IJ SOLN
INTRAMUSCULAR | Status: DC | PRN
  Administered 2019-05-29: 10 mg via INTRAVENOUS

## 2019-05-29 MED ORDER — SUCCINYLCHOLINE CHLORIDE 200 MG/10ML IV SOSY
PREFILLED_SYRINGE | INTRAVENOUS | Status: DC | PRN
  Administered 2019-05-29: 140 mg via INTRAVENOUS

## 2019-05-29 MED ORDER — SODIUM CHLORIDE 0.9 % IV SOLN: 250.0000 mL | INTRAVENOUS | Status: DC | PRN

## 2019-05-29 MED ORDER — LISINOPRIL-HYDROCHLOROTHIAZIDE 20-12.5 MG PO TABS: 1.0000 | ORAL_TABLET | Freq: Every morning | ORAL | Status: DC

## 2019-05-29 MED ORDER — SODIUM CHLORIDE 0.9 % IV SOLN
INTRAVENOUS | Status: DC
  Administered 2019-05-29: 06:00:00 via INTRAVENOUS

## 2019-05-29 MED ORDER — OFF THE BEAT BOOK
Freq: Once | Status: AC
  Administered 2019-05-29: 22:00:00 1
  Filled 2019-05-29: qty 1

## 2019-05-29 MED ORDER — LINAGLIPTIN 5 MG PO TABS
5.0000 mg | ORAL_TABLET | Freq: Every day | ORAL | Status: DC
  Filled 2019-05-29 (×2): qty 1

## 2019-05-29 MED ORDER — LISINOPRIL 20 MG PO TABS
20.0000 mg | ORAL_TABLET | Freq: Every day | ORAL | Status: DC
  Administered 2019-05-29 – 2019-05-30 (×2): 20 mg via ORAL
  Filled 2019-05-29 (×2): qty 1

## 2019-05-29 MED ORDER — HYPROMELLOSE (GONIOSCOPIC) 2.5 % OP SOLN
1.0000 [drp] | OPHTHALMIC | Status: DC | PRN
  Filled 2019-05-29: qty 15

## 2019-05-29 MED ORDER — HEPARIN (PORCINE) IN NACL 1000-0.9 UT/500ML-% IV SOLN
INTRAVENOUS | Status: DC | PRN
  Administered 2019-05-29 (×5): 500 mL

## 2019-05-29 MED ORDER — ACETAMINOPHEN 325 MG PO TABS
650.0000 mg | ORAL_TABLET | ORAL | Status: DC | PRN
  Administered 2019-05-29 – 2019-05-30 (×3): 650 mg via ORAL
  Filled 2019-05-29 (×5): qty 2

## 2019-05-29 MED ORDER — HEPARIN SODIUM (PORCINE) 1000 UNIT/ML IJ SOLN
INTRAMUSCULAR | Status: DC | PRN
  Administered 2019-05-29: 14000 [IU] via INTRAVENOUS
  Administered 2019-05-29: 2000 [IU] via INTRAVENOUS

## 2019-05-29 MED ORDER — FLECAINIDE ACETATE 50 MG PO TABS
50.0000 mg | ORAL_TABLET | Freq: Two times a day (BID) | ORAL | Status: DC
  Administered 2019-05-29 – 2019-05-30 (×2): 50 mg via ORAL
  Filled 2019-05-29 (×3): qty 1

## 2019-05-29 MED ORDER — PROTAMINE SULFATE 10 MG/ML IV SOLN
INTRAVENOUS | Status: DC | PRN
  Administered 2019-05-29: 50 mg via INTRAVENOUS

## 2019-05-29 MED ORDER — BUPIVACAINE HCL (PF) 0.25 % IJ SOLN
INTRAMUSCULAR | Status: AC
  Filled 2019-05-29: qty 30

## 2019-05-29 MED ORDER — ONDANSETRON HCL 4 MG/2ML IJ SOLN: 4.0000 mg | Freq: Four times a day (QID) | INTRAMUSCULAR | Status: DC | PRN

## 2019-05-29 SURGICAL SUPPLY — 21 items
BLANKET WARM UNDERBOD FULL ACC (MISCELLANEOUS) ×2 IMPLANT
CATH MAPPNG PENTARAY F 2-6-2MM (CATHETERS) ×1 IMPLANT
CATH SMTCH THERMOCOOL SF DF (CATHETERS) ×2 IMPLANT
CATH SOUNDSTAR ECO REPROCESSED (CATHETERS) ×2 IMPLANT
CATH WEBSTER BI DIR CS D-F CRV (CATHETERS) ×2 IMPLANT
COVER SWIFTLINK CONNECTOR (BAG) ×2 IMPLANT
PACK EP LATEX FREE (CUSTOM PROCEDURE TRAY) ×1
PACK EP LF (CUSTOM PROCEDURE TRAY) ×1 IMPLANT
PAD PRO RADIOLUCENT 2001M-C (PAD) ×2 IMPLANT
PATCH CARTO3 (PAD) ×2 IMPLANT
PENTARAY F 2-6-2MM (CATHETERS) ×2
SHEATH AVANTI 11F 11CM (SHEATH) ×2 IMPLANT
SHEATH BAYLIS SUREFLEX  M 8.5 (SHEATH) ×1
SHEATH BAYLIS SUREFLEX M 8.5 (SHEATH) ×1 IMPLANT
SHEATH BAYLIS TRANSSEPTAL 98CM (NEEDLE) ×2 IMPLANT
SHEATH CARTO VIZIGO SM CVD (SHEATH) ×2 IMPLANT
SHEATH PINNACLE 7F 10CM (SHEATH) ×2 IMPLANT
SHEATH PINNACLE 8F 10CM (SHEATH) ×4 IMPLANT
SHEATH PINNACLE 9F 10CM (SHEATH) ×2 IMPLANT
SHEATH PINNACLE VASC 9FR (SHEATH) ×2 IMPLANT
TUBING SMART ABLATE COOLFLOW (TUBING) ×2 IMPLANT

## 2019-05-29 NOTE — Transfer of Care (Signed)
Immediate Anesthesia Transfer of Care Note  Patient: Erika Kim  Procedure(s) Performed: ATRIAL FIBRILLATION ABLATION (N/A )  Patient Location: Cath Lab  Anesthesia Type:General  Level of Consciousness: drowsy  Airway & Oxygen Therapy: Patient Spontanous Breathing and Patient connected to nasal cannula oxygen  Post-op Assessment: Report given to RN and Post -op Vital signs reviewed and stable  Post vital signs: Reviewed and stable  Last Vitals:  Vitals Value Taken Time  BP 127/49 05/29/19 1126  Temp 36.1 C 05/29/19 1125  Pulse 91 05/29/19 1130  Resp 18 05/29/19 1130  SpO2 95 % 05/29/19 1130  Vitals shown include unvalidated device data.  Last Pain:  Vitals:   05/29/19 1125  TempSrc: Temporal  PainSc: Asleep      Patients Stated Pain Goal: 3 (17/47/15 9539)  Complications: No apparent anesthesia complications

## 2019-05-29 NOTE — Progress Notes (Signed)
Site area: left femoral vein x2 Site Prior to Removal:  level 1 Pressure Applied For: 25 mins Manual: yes Patient Status During Pull:  stable Post Pull Site:  level 1 Post Pull Instructions Given: yes   Post Pull Pulses Present: left DP palpable Dressing Applied: guaze and tegaderm Bedrest begins @ 7824 Comments: bedrest order for 6 hours per physician

## 2019-05-29 NOTE — Progress Notes (Signed)
On arrival into room I noted a large quantity of blood in the pure wick vacuome container. Bilateral groins bleeding, pressure held bilaterally, Amy G  RN and myself held pressure x 15 minutes, hemostasis obtained, no hematoma. VSS. New dressings applied and Dr Curt Bears was notified.

## 2019-05-29 NOTE — Progress Notes (Signed)
Site area:  Rt groin fv sheaths x2 Site Prior to Removal:  Level 0 Pressure Applied For: 20 minutes Manual:   yes Patient Status During Pull:  stable Post Pull Site:  Level 0 Post Pull Instructions Given:  yes Post Pull Pulses Present: rt dp alpable Dressing Applied:  Gauze and tegaderm Bedrest begins @  Comments:

## 2019-05-29 NOTE — Anesthesia Postprocedure Evaluation (Signed)
Anesthesia Post Note  Patient: Erika Kim  Procedure(s) Performed: ATRIAL FIBRILLATION ABLATION (N/A )     Anesthesia Post Evaluation  Last Vitals:  Vitals:   05/29/19 1926 05/29/19 1949  BP: (!) 157/86   Pulse:  90  Resp:    Temp:  36.7 C  SpO2:  95%    Last Pain:  Vitals:   05/29/19 1949  TempSrc: Oral  PainSc:                  [name]

## 2019-05-29 NOTE — H&P (Signed)
Electrophysiology Office Note   Date:  05/29/2019   ID:  Erika, Kim Apr 06, 1946, MRN [id number]  PCP:  [name], MD  Cardiologist:  Marlou Porch Primary Electrophysiologist: Allegra Lai, MD  No chief complaint on file.    History of Present Illness: Erika Kim is a 73 y.o. female who is being seen today for the evaluation of atrial fibrillation at the request of [name]. Presenting today for electrophysiology evaluation.  She has a history of hypertension, hyperlipidemia, paroxysmal atrial fibrillation, diabetes, and OSA on CPAP.  She has been having occasional episodes of atrial fibrillation that is increased despite starting flecainide.  She has had been having side effects to flecainide.  She was recently admitted to the hospital with symptomatic anemia and melanotic stools.  Her Xarelto was stopped.  She had a capsule endoscopy which showed oozing blood from multiple low-grade lesions.  Continue to have short episodes of atrial fibrillation and is thus interested in ablation.  She presents today for follow up. Have been unable to proceed with ablation prior to this appointment due to COVID restrictions. She denies symptoms of palpitations, chest pain, shortness of breath, orthopnea, PND, lower extremity edema, claudication, dizziness, presyncope, syncope, bleeding, or neurologic sequela. The patient is tolerating medications without difficulties.     Past Medical History:  Diagnosis Date  . Acoustic neuroma ([place]) followed by dr brown at [facility]   right side w/ chronic roaring noise  . Anticoagulant long-term use    xarelto  . Benign paroxysmal positional vertigo   . Endometrial polyp   . Frequency of urination   . Hyperlipidemia   . Hypertension   . OA (osteoarthritis)    left knee  . OSA on CPAP   . PAF (paroxysmal atrial fibrillation) [facility] cardiologist-  dr Marlou Porch   dx 04/ 2017  . PMB (postmenopausal bleeding)   . TMJ (temporomandibular joint  disorder)    left side-- wears guard  . Type 2 diabetes mellitus ([place])   . Wears contact lenses    Past Surgical History:  Procedure Laterality Date  . CARPAL TUNNEL RELEASE Right 04/17/2007   w/ Excision ganglion cyst and Pulley Release right thumb  . DILATATION & CURETTAGE/HYSTEROSCOPY WITH MYOSURE N/A 01/12/2017   Procedure: DILATATION & CURETTAGE/HYSTEROSCOPY WITH MYOSURE;  Surgeon: Arvella Nigh, MD;  Location: [place];  Service: Gynecology;  Laterality: N/A;  . ESOPHAGOGASTRODUODENOSCOPY (EGD) WITH PROPOFOL N/A 09/16/2018   Procedure: ESOPHAGOGASTRODUODENOSCOPY (EGD) WITH PROPOFOL;  Surgeon: Ronald Lobo, MD;  Location: [place];  Service: Endoscopy;  Laterality: N/A;  . GIVENS CAPSULE STUDY N/A 09/16/2018   Procedure: GIVENS CAPSULE STUDY;  Surgeon: Ronald Lobo, MD;  Location: [place];  Service: Endoscopy;  Laterality: N/A;  . HAMMER TOE SURGERY Left 2010   left second toe  . KNEE ARTHROSCOPY Right 2010  . KNEE SURGERY Left 1993  . LYMPH NODE BIOPSY N/A 03/01/2017   Procedure: SENTINEL LYMPH NODE BIOPSY;  Surgeon: Everitt Amber, MD;  [facility];  Service: Gynecology;  Laterality: N/A;  . NASAL SINUS SURGERY  1985 and 1995  . ROBOTIC ASSISTED TOTAL HYSTERECTOMY WITH BILATERAL SALPINGO OOPHERECTOMY Bilateral 03/01/2017   Procedure: XI ROBOTIC ASSISTED TOTAL HYSTERECTOMY WITH BILATERAL SALPINGO OOPHORECTOMY;  Surgeon: Everitt Amber, MD;  [facility];  Service: Gynecology;  Laterality: Bilateral;  . ROTATOR CUFF REPAIR Right 04/2004  . TOE SURGERY Right 2012   Great toe and second toe  . TOTAL ELBOW REPLACEMENT Right 2000  . TOTAL KNEE  ARTHROPLASTY Right 01/12/2010  . TRANSTHORACIC ECHOCARDIOGRAM  03/28/2016   ef 60-65%/ mild AR and MR  . TUBAL LIGATION Bilateral 1978     Current Facility-Administered Medications  Medication Dose Route Frequency Provider Last Rate Last Dose  . 0.9 %  sodium chloride infusion   Intravenous Continuous  [name], MD 50 mL/hr at 05/29/19 7858      Allergies:   Cephalosporins and Augmentin [amoxicillin-pot clavulanate]   Social History:  The patient  reports that she has never smoked. She has never used smokeless tobacco. She reports that she does not drink alcohol or use drugs.   Family History:  The patient's family history includes Heart failure in her father and mother.   Review of systems complete and found to be negative unless listed in HPI.     Physical Exam: Vitals:   05/29/19 0550  BP: (!) 133/47  Pulse: (!) 55  Resp: 18  Temp: 97.7 F (36.5 C)  TempSrc: Temporal  SpO2: 99%  Weight: 97.1 kg  Height: 5\' 3"  (1.6 m)    GEN- The patient is well appearing, alert and oriented x 3 today.   Head- normocephalic, atraumatic Eyes-  Sclera clear, conjunctiva pink Ears- hearing intact Oropharynx- clear Neck- supple,  Lungs- Clear to ausculation bilaterally, normal work of breathing Heart- Regular rate and rhythm, no murmurs, rubs or gallops, PMI not laterally displaced GI- soft, NT, ND, + BS Extremities- no clubbing, cyanosis, or edema Neuro- strength and sensation are intact  EKG:  EKG is ordered today. Personal review of the ekg shows sinus rhythm, rate 64  Recent Labs: 09/16/2018: ALT 20 05/16/2019: BUN 12; Creatinine, Ser 0.91; Hemoglobin 14.7; Platelets 166; Potassium 4.6; Sodium 142    Lipid Panel  No results found for: CHOL, TRIG, HDL, CHOLHDL, VLDL, LDLCALC, LDLDIRECT   Wt Readings from Last 3 Encounters:  05/29/19 97.1 kg  04/08/19 97.1 kg  09/24/18 94.1 kg    Other studies Reviewed: Additional studies/ records that were reviewed today include: TTE 2017  Review of the above records today demonstrates:  - Left ventricle: The cavity size was normal. Systolic function was   normal. The estimated ejection fraction was in the range of 60%   to 65%. Wall motion was normal; there were no regional wall   motion abnormalities. The transmitral flow  pattern was normal.   Left ventricular diastolic function parameters were normal. - Aortic valve: Moderate focal calcification, consistent with   sclerosis. There was mild regurgitation. - Mitral valve: There was mild regurgitation.   ASSESSMENT AND PLAN:  1.  Paroxysmal atrial fibrillation: Currently on Xarelto and flecainide.  Flecainide is no longer controlling her symptoms. Have discussed switch to Eliquis if she can tolerate due to her history of GI bleeding. She follows with GI. Have previously discussed risk and benefits of ablation including, but not limited to bleeding, tamponade, heart block, stroke, damage to surrounding organs.  She understands these risks and has agreed to the procedure.  This patients CHA2DS2-VASc Score and unadjusted Ischemic Stroke Rate (% per year) is equal to 4.8 % stroke rate/year from a score of 4  Above score calculated as 1 point each if present [CHF, HTN, DM, Vascular=MI/PAD/Aortic Plaque, Age if 65-74, or Female] Above score calculated as 2 points each if present [Age > 75, or Stroke/TIA/TE]  2.  Obstructive sleep apnea: Encouraged nightly CPAP use.   3.  Hypertension: Controlled on current medications.   4.  Hyperlipidemia: Continue atorvastatin.   5.  Morbid obesity: Encouraged weight loss via exercise and diet.   Erika Kim has presented today for surgery, with the diagnosis of atrial fibrillation.  The various methods of treatment have been discussed with the patient and family. After consideration of risks, benefits and other options for treatment, the patient has consented to  Procedure(s): Catheter ablation as a surgical intervention .  Risks include but not limited to bleeding, tamponade, heart block, stroke, damage to surrounding organs, among others. The patient's history has been reviewed, patient examined, no change in status, stable for surgery.  I have reviewed the patient's chart and labs.  Questions were answered to the  patient's satisfaction.    Nikelle Malatesta Curt Bears, MD 05/29/2019 7:12 AM

## 2019-05-29 NOTE — Anesthesia Preprocedure Evaluation (Signed)
Anesthesia Evaluation  Patient identified by MRN, date of birth, ID band Patient awake    Reviewed: Allergy & Precautions, NPO status , Patient's Chart, lab work & pertinent test results  Airway Mallampati: II  TM Distance: >3 FB     Dental   Pulmonary    breath sounds clear to auscultation       Cardiovascular hypertension,  Rhythm:Regular Rate:Normal     Neuro/Psych    GI/Hepatic negative GI ROS, Neg liver ROS,   Endo/Other  diabetes  Renal/GU negative Renal ROS     Musculoskeletal   Abdominal   Peds  Hematology   Anesthesia Other Findings   Reproductive/Obstetrics                             Anesthesia Physical Anesthesia Plan  ASA: III  Anesthesia Plan: General   Post-op Pain Management:    Induction: Intravenous  PONV Risk Score and Plan: 3 and Ondansetron, Dexamethasone and Midazolam  Airway Management Planned: Oral ETT  Additional Equipment:   Intra-op Plan:   Post-operative Plan: Possible Post-op intubation/ventilation  Informed Consent: I have reviewed the patients History and Physical, chart, labs and discussed the procedure including the risks, benefits and alternatives for the proposed anesthesia with the patient or authorized representative who has indicated his/her understanding and acceptance.     Dental advisory given  Plan Discussed with: CRNA and Anesthesiologist  Anesthesia Plan Comments:         Anesthesia Quick Evaluation

## 2019-05-29 NOTE — Anesthesia Procedure Notes (Signed)
Procedure Name: Intubation Date/Time: 05/29/2019 7:46 AM Performed by: Shirlyn Goltz, CRNA Pre-anesthesia Checklist: Patient identified, Emergency Drugs available, Suction available and Patient being monitored Patient Re-evaluated:Patient Re-evaluated prior to induction Oxygen Delivery Method: Circle system utilized Preoxygenation: Pre-oxygenation with 100% oxygen Induction Type: IV induction Ventilation: Mask ventilation without difficulty Laryngoscope Size: Mac and 3 Grade View: Grade II Tube type: Oral Tube size: 7.0 mm Number of attempts: 1 Airway Equipment and Method: Stylet Placement Confirmation: ETT inserted through vocal cords under direct vision,  positive ETCO2 and breath sounds checked- equal and bilateral Secured at: 20 cm Tube secured with: Tape Dental Injury: Teeth and Oropharynx as per pre-operative assessment

## 2019-05-29 NOTE — Progress Notes (Signed)
Pt had a question regarding discontinuing flecainide.  Confirmed with cardiology, received order from Candy Sledge, PA to have her take it tonight and tomorrow morning and ask Dr. Curt Bears in the morning since he did not mention to discontinue it in his note.  Idolina Primer, RN

## 2019-05-30 ENCOUNTER — Encounter (HOSPITAL_COMMUNITY): Payer: Self-pay | Admitting: Cardiology

## 2019-05-30 DIAGNOSIS — E785 Hyperlipidemia, unspecified: Secondary | ICD-10-CM | POA: Diagnosis not present

## 2019-05-30 DIAGNOSIS — I10 Essential (primary) hypertension: Secondary | ICD-10-CM | POA: Diagnosis not present

## 2019-05-30 DIAGNOSIS — E119 Type 2 diabetes mellitus without complications: Secondary | ICD-10-CM | POA: Diagnosis not present

## 2019-05-30 DIAGNOSIS — Z7984 Long term (current) use of oral hypoglycemic drugs: Secondary | ICD-10-CM | POA: Diagnosis not present

## 2019-05-30 DIAGNOSIS — I480 Paroxysmal atrial fibrillation: Secondary | ICD-10-CM | POA: Diagnosis not present

## 2019-05-30 DIAGNOSIS — G4733 Obstructive sleep apnea (adult) (pediatric): Secondary | ICD-10-CM | POA: Diagnosis not present

## 2019-05-30 DIAGNOSIS — Z7901 Long term (current) use of anticoagulants: Secondary | ICD-10-CM | POA: Diagnosis not present

## 2019-05-30 DIAGNOSIS — Z79899 Other long term (current) drug therapy: Secondary | ICD-10-CM | POA: Diagnosis not present

## 2019-05-30 DIAGNOSIS — Z881 Allergy status to other antibiotic agents status: Secondary | ICD-10-CM | POA: Diagnosis not present

## 2019-05-30 LAB — POCT ACTIVATED CLOTTING TIME
Activated Clotting Time: 439 seconds
Activated Clotting Time: 499 seconds
Activated Clotting Time: 433 seconds
Activated Clotting Time: 323 seconds

## 2019-05-30 LAB — GLUCOSE, CAPILLARY: Glucose-Capillary: 153 mg/dL — ABNORMAL HIGH (ref 70–99)

## 2019-05-30 NOTE — Discharge Summary (Addendum)
ELECTROPHYSIOLOGY PROCEDURE DISCHARGE SUMMARY    Patient ID: Erika Kim,  MRN: [id number], DOB/AGE: Jul 29, 1946 73 y.o.  Admit date: 05/29/2019 Discharge date: 05/30/2019  Primary Care Physician: Erika Battles, MD  Primary Cardiologist: Erika. Marlou Kim Electrophysiologist: Erika. Curt Kim  Primary Discharge Diagnosis:  Atrial fibrillation s/p Afib ablation  Secondary Discharge Diagnosis:  OSA HTN HLD Morbid Obesity  Procedures This Admission:  1.  Electrophysiology study and radiofrequency catheter ablation on 05/29/2019 by Erika Erika Kim.  This study demonstrated sinus rhythm upon presentation;  Successful electrical isolation and anatomical encircling of all four pulmonary veins with radiofrequency current; No inducible arrhythmias following ablation both on and off of dobutamine; No early apparent complications.  Brief HPI: Erika Kim is a 73 y.o. female with a history of paroxysmal atrial fibrillation.  They have failed medical therapy with flecainide. Risks, benefits, and alternatives to catheter ablation of atrial fibrillation were reviewed with the patient who wished to proceed.  The patient underwent TEE prior to the procedure which demonstrated normal LV function and no LAA thrombus.    Hospital Course:  The patient was admitted and underwent EPS/RFCA of atrial fibrillation with details as outlined above.  They were monitored on telemetry overnight which demonstrated NSR.  Groin was had mild bleeding so patient kept overnight for observation. Groin stable on day of discharge including with ambulationThe patient was examined and considered to be stable for discharge.  Wound care and restrictions were reviewed with the patient.  The patient Erika Kim be seen back by Erika Palau, NP in 4 weeks and Erika Kim 12 weeks for post ablation follow up.   This patients CHA2DS2-VASc Score and unadjusted Ischemic Stroke Rate (% per year) is equal to 4.8 % stroke rate/year from a score of  4 Above score calculated as 1 point each if present [CHF, HTN, DM, Vascular=MI/PAD/Aortic Plaque, Age if 65-74, or Female] Above score calculated as 2 points each if present [Age > 75, or Stroke/TIA/TE]   Physical Exam: Vitals:   05/30/19 0018 05/30/19 0552 05/30/19 0719 05/30/19 0851  BP: 120/62 140/82 138/68 (!) 117/51  Pulse: 83 77 83 76  Resp:  20  19  Temp: (!) 97.5 F (36.4 C)  97.7 F (36.5 C) 98.3 F (36.8 C)  TempSrc: Oral  Oral Oral  SpO2: 98% 99% 98% 96%  Weight:   118.2 kg   Height:        GEN- The patient is well appearing, alert and oriented x 3 today.   HEENT: normocephalic, atraumatic; sclera clear, conjunctiva pink; hearing intact; oropharynx clear; neck supple  Lungs- Clear to ausculation bilaterally, normal work of breathing.  No wheezes, rales, rhonchi Heart- Regular rate and rhythm, no murmurs, rubs or gallops  GI- soft, non-tender, non-distended, bowel sounds present  Extremities- no clubbing, cyanosis, or edema; DP/PT/radial pulses 2+ bilaterally, groin without hematoma/bruit MS- no significant deformity or atrophy Skin- warm and dry, no rash or lesion Psych- euthymic mood, full affect Neuro- strength and sensation are intact   Labs:   Lab Results  Component Value Date   WBC 6.8 05/16/2019   HGB 14.7 05/16/2019   HCT 43.3 05/16/2019   MCV 88 05/16/2019   PLT 166 05/16/2019   No results for input(s): NA, K, CL, CO2, BUN, CREATININE, CALCIUM, PROT, BILITOT, ALKPHOS, ALT, AST, GLUCOSE in the last 168 hours.  Invalid input(s): LABALBU   Discharge Medications:  Allergies as of 05/30/2019      Reactions   Cephalosporins Itching  severe   Augmentin [amoxicillin-pot Clavulanate] Itching   severe      Medication List    TAKE these medications   acetaminophen 500 MG tablet Commonly known as: TYLENOL Take 1,000 mg by mouth every 8 (eight) hours as needed for moderate pain or headache.   atorvastatin 40 MG tablet Commonly known as: LIPITOR  Take 40 mg by mouth every morning.   betamethasone valerate 0.1 % cream Commonly known as: VALISONE Apply 1 application topically 2 (two) times a week.   flecainide 50 MG tablet Commonly known as: TAMBOCOR TAKE ONE TABLET BY MOUTH TWICE DAILY   ipratropium 0.03 % nasal spray Commonly known as: ATROVENT Place 2 sprays into both nostrils every 12 (twelve) hours as needed for rhinitis.   Januvia 100 MG tablet Generic drug: sitaGLIPtin Take 100 mg by mouth daily after supper.   lisinopril-hydrochlorothiazide 20-12.5 MG tablet Commonly known as: ZESTORETIC Take 1 tablet by mouth every morning.   metoprolol succinate 50 MG 24 hr tablet Commonly known as: TOPROL-XL Take 50 mg by mouth 2 (two) times daily. May take a third time if heart goes into a-fib   multivitamin tablet Take 1 tablet by mouth daily.   ONE TOUCH ULTRA TEST test strip Generic drug: glucose blood   OneTouch Delica Plus [id number] Misc   rivaroxaban 20 MG Tabs tablet Commonly known as: Xarelto TAKE ONE TABLET BY MOUTH ONE TIME DAILY WITH SUPPER   SYSTANE OP Place 1 drop into both eyes daily as needed (dry eyes).   Vitamin D3 125 MCG (5000 UT) Tabs Take 5,000 Units by mouth daily.       Disposition:   Follow-up Information    Erika Needs, NP Follow up on 07/01/2019.   Specialties: Nurse Practitioner, Cardiology Why: at 0930 for 1 month post ablation follow up Contact information: [place] [address] [phone]        Erika Haw, MD Follow up on 09/04/2019.   Specialty: Cardiology Why: at 0945 for 3 month post ablation follow up.  Contact information: [address] [place] [address] [phone]           Duration of Discharge Encounter: Greater than 30 minutes including physician time.  Signed, Erika Friar, PA-C  05/30/2019 10:48 AM  I have seen and examined this patient with Erika Kim.  Agree with above, note added to  reflect my findings.  On exam, RRR, no murmurs, lungs clear.  Patient had AF ablation for paroxysmal atrial fibrillation. Tolerated the procedure well without complaint this morning. Plan for discharge today with follow up in EP clinic.  Erika Kim M. Erika Schaberg MD 05/30/2019 2:46 PM

## 2019-06-02 ENCOUNTER — Telehealth: Payer: Self-pay

## 2019-06-02 NOTE — Telephone Encounter (Signed)
**Note De-Identified Deshannon Hinchliffe Obfuscation** The pt is advised to contact [facility] pt asst [facility] (I provided her their phone# [phone]) as she has a lot of questions concerning her eligibility to receive pt asst for her Xarelto and I asked that she request that they mail her an application if she is eligable. The pt verbalized understanding and thanked me for calling her back.  She is aware to call me if she needs help with her application.

## 2019-06-02 NOTE — Telephone Encounter (Signed)
I left a message on the pts VM asking her to call me back. 

## 2019-06-02 NOTE — Telephone Encounter (Signed)
-----   Message from Stanton Kidney, RN sent at 05/30/2019  1:14 PM EDT ----- Regarding: Xarelto assistance Hey lady, got a call from [place] in hospital yesterday stating pt needs assistance affording her Xarelto.  I am sorry I don't know the details but could you follow up w/ pt to see what is needed.  Thx

## 2019-06-02 NOTE — Telephone Encounter (Signed)
Follow up ° ° °Patient is returning your call. Please call. ° ° ° °

## 2019-06-02 NOTE — Telephone Encounter (Addendum)
**Note De-identified Phelix Fudala Obfuscation** -----  **Note De-Identified Rayner Erman Obfuscation** Message from Stanton Kidney, RN sent at 05/30/2019  1:14 PM EDT ----- Regarding: Xarelto assistance Got a call from [place] in hospital yesterday stating pt needs assistance affording her Xarelto.  I am sorry I don't know the details but could you follow up w/ pt to see what is needed.  Thx

## 2019-06-10 ENCOUNTER — Encounter: Payer: Self-pay | Admitting: Cardiology

## 2019-06-10 ENCOUNTER — Ambulatory Visit: Payer: Medicare HMO | Admitting: Cardiology

## 2019-06-10 ENCOUNTER — Other Ambulatory Visit: Payer: Self-pay

## 2019-06-10 VITALS — BP 140/80 | HR 68 | Ht 63.0 in | Wt 212.2 lb

## 2019-06-10 DIAGNOSIS — E119 Type 2 diabetes mellitus without complications: Secondary | ICD-10-CM

## 2019-06-10 DIAGNOSIS — I480 Paroxysmal atrial fibrillation: Secondary | ICD-10-CM

## 2019-06-10 DIAGNOSIS — I10 Essential (primary) hypertension: Secondary | ICD-10-CM

## 2019-06-10 DIAGNOSIS — G4733 Obstructive sleep apnea (adult) (pediatric): Secondary | ICD-10-CM | POA: Diagnosis not present

## 2019-06-10 NOTE — Patient Instructions (Signed)
Medication Instructions:  The current medical regimen is effective;  continue present plan and medications.  If you need a refill on your cardiac medications before your next appointment, please call your pharmacy.   Follow-Up: At [facility], you and your health needs are our priority.  As part of our continuing mission to provide you with exceptional heart care, we have created designated Provider Care Teams.  These Care Teams include your primary Cardiologist (physician) and Advanced Practice Providers (APPs -  Physician Assistants and Nurse Practitioners) who all work together to provide you with the care you need, when you need it. You will need a follow up appointment in 6 months.  Please call our office 2 months in advance to schedule this appointment.  You may see Mark Skains, MD or one of the following Advanced Practice Providers on your designated Care Team:   Lori Gerhardt, NP Laura Ingold, NP . Jill McDaniel, NP  Thank you for choosing [facility]!!       

## 2019-06-10 NOTE — H&P (View-Only) (Signed)
Cardiology Office Note:    Date:  06/10/2019   ID:  Erika Kim, DOB [birth year], MRN [id number]  PCP:  [name], MD  Cardiologist:  [name], MD  Electrophysiologist:  [name], MD   Referring MD: [name], MD   Post atrial fibrillation follow-up, ablation  History of Present Illness:    Erika Kim is a 73 y.o. female with a hx of atrial fibrillation post ablation performed on 05/29/2019 by Dr. Curt Bears.  Has hypertension hyperlipidemia paroxysmal atrial fibrillation diabetes obstructive sleep apnea on CPAP.  She was having occasional episodes of A. fib that was increased despite flecainide.  She was also having some side effects of flecainide.  She was admitted to the hospital with symptomatic anemia and melanotic stools and her Xarelto was stopped at one point.  Capsule endoscopy showed oozing from multiple low-grade lesions.  Because of continued episodes of A. fib she had ablation performed by Dr. Curt Bears.  Finally after COVID restrictions, she was able to have her ablation performed on 05/29/2019.  EKG prior to ablation shows sinus rhythm rate 64.  Hemoglobin was 14.7.  Creatinine 0.9.  Walked 40 min this AM. Feels good. No CP, SOB only with hill. No bleeding.  She is still taking Xarelto (was off Dr. Cristina Gong for 6 weeks).  Overall she has not seen any melanotic stools.  Hemoglobin remained stable at 14.7 on 05/16/2019.  Hemoglobin A1c 5.8.   Past Medical History:  Diagnosis Date   Acoustic neuroma [facility]) followed by dr brown at [facility]   right side w/ chronic roaring noise   Anticoagulant long-term use    xarelto   Benign paroxysmal positional vertigo    Endometrial polyp    Frequency of urination    Hyperlipidemia    Hypertension    OA (osteoarthritis)    left knee   OSA on CPAP    PAF (paroxysmal atrial fibrillation) [facility]) cardiologist-  dr Marlou Porch   dx 04/ 2017   PMB (postmenopausal bleeding)    TMJ (temporomandibular  joint disorder)    left side-- wears guard   Type 2 diabetes mellitus ([place])    Wears contact lenses     Past Surgical History:  Procedure Laterality Date   ATRIAL FIBRILLATION ABLATION  05/29/2019   ATRIAL FIBRILLATION ABLATION N/A 05/29/2019   Procedure: ATRIAL FIBRILLATION ABLATION;  Surgeon: [name], MD;  Location: [facility];  Service: Cardiovascular;  Laterality: N/A;   CARPAL TUNNEL RELEASE Right 04/17/2007   w/ Excision ganglion cyst and Pulley Release right thumb   DILATATION & CURETTAGE/HYSTEROSCOPY WITH MYOSURE N/A 01/12/2017   Procedure: DILATATION & CURETTAGE/HYSTEROSCOPY WITH MYOSURE;  Surgeon: Arvella Nigh, MD;  Location: [place];  Service: Gynecology;  Laterality: N/A;   ESOPHAGOGASTRODUODENOSCOPY (EGD) WITH PROPOFOL N/A 09/16/2018   Procedure: ESOPHAGOGASTRODUODENOSCOPY (EGD) WITH PROPOFOL;  Surgeon: Ronald Lobo, MD;  Location: [place];  Service: Endoscopy;  Laterality: N/A;   GIVENS CAPSULE STUDY N/A 09/16/2018   Procedure: GIVENS CAPSULE STUDY;  Surgeon: Ronald Lobo, MD;  Location: [place];  Service: Endoscopy;  Laterality: N/A;   HAMMER TOE SURGERY Left 2010   left second toe   KNEE ARTHROSCOPY Right 2010   KNEE SURGERY Left 1993   LYMPH NODE BIOPSY N/A 03/01/2017   Procedure: SENTINEL LYMPH NODE BIOPSY;  Surgeon: Everitt Amber, MD;  Location: [facility];  Service: Gynecology;  Laterality: N/A;   NASAL SINUS SURGERY  1985 and [place]  WITH BILATERAL SALPINGO OOPHERECTOMY Bilateral 03/01/2017   Procedure: XI ROBOTIC ASSISTED TOTAL HYSTERECTOMY WITH BILATERAL SALPINGO OOPHORECTOMY;  Surgeon: Everitt Amber, MD;  Location: [facility];  Service: Gynecology;  Laterality: Bilateral;   ROTATOR CUFF REPAIR Right 04/2004   TOE SURGERY Right 2012   Great toe and second toe   TOTAL ELBOW REPLACEMENT Right 2000   TOTAL KNEE ARTHROPLASTY Right 01/12/2010   TRANSTHORACIC ECHOCARDIOGRAM   03/28/2016   ef 60-65%/ mild AR and MR   TUBAL LIGATION Bilateral 1978    Current Medications: Current Meds  Medication Sig   acetaminophen (TYLENOL) 500 MG tablet Take 1,000 mg by mouth every 8 (eight) hours as needed for moderate pain or headache.    atorvastatin (LIPITOR) 40 MG tablet Take 40 mg by mouth every morning.    betamethasone valerate (VALISONE) 0.1 % cream Apply 1 application topically 2 (two) times a week.    Cholecalciferol (VITAMIN D3) 5000 units TABS Take 5,000 Units by mouth daily.    flecainide (TAMBOCOR) 50 MG tablet TAKE ONE TABLET BY MOUTH TWICE DAILY    ipratropium (ATROVENT) 0.03 % nasal spray Place 2 sprays into both nostrils every 12 (twelve) hours as needed for rhinitis.   JANUVIA 100 MG tablet Take 100 mg by mouth daily after supper.    Lancets (ONETOUCH DELICA PLUS [id number]) MISC    lisinopril-hydrochlorothiazide (PRINZIDE,ZESTORETIC) 20-12.5 MG per tablet Take 1 tablet by mouth every morning.    metoprolol succinate (TOPROL-XL) 50 MG 24 hr tablet Take 50 mg by mouth 2 (two) times daily. May take a third time if heart goes into a-fib   Multiple Vitamin (MULTIVITAMIN) tablet Take 1 tablet by mouth daily.   ONE TOUCH ULTRA TEST test strip    Polyethyl Glycol-Propyl Glycol (SYSTANE OP) Place 1 drop into both eyes daily as needed (dry eyes).   rivaroxaban (XARELTO) 20 MG TABS tablet TAKE ONE TABLET BY MOUTH ONE TIME DAILY WITH SUPPER     Allergies:   Cephalosporins and Augmentin [amoxicillin-pot clavulanate]   Social History   Socioeconomic History   Marital status: Married    Spouse name: Not on file   Number of children: Not on file   Years of education: Not on file   Highest education level: Not on file  Occupational History   Not on file  Social Needs   Financial resource strain: Not on file   Food insecurity    Worry: Not on file    Inability: Not on file   Transportation needs    Medical: Not on file    Non-medical:  Not on file  Tobacco Use   Smoking status: Never Smoker   Smokeless tobacco: Never Used  Substance and Sexual Activity   Alcohol use: No   Drug use: No   Sexual activity: Not on file  Lifestyle   Physical activity    Days per week: Not on file    Minutes per session: Not on file   Stress: Not on file  Relationships   Social connections    Talks on phone: Not on file    Gets together: Not on file    Attends religious service: Not on file    Active member of club or organization: Not on file    Attends meetings of clubs or organizations: Not on file    Relationship status: Not on file  Other Topics Concern   Not on file  Social History Narrative   Married w/2 children (boy and girl).  Retired.  Family History: The patient's family history includes Heart failure in her father and mother.  ROS:   Please see the history of present illness.    Denies any melena, chest pain, minimal shortness of breath with activity all other systems reviewed and are negative.  EKGs/Labs/Other Studies Reviewed:    The following studies were reviewed today: ECHO - Left ventricle: The cavity size was normal. Systolic function was normal. The estimated ejection fraction was in the range of 60% to 65%. Wall motion was normal; there were no regional wall motion abnormalities. The transmitral flow pattern was normal. Left ventricular diastolic function parameters were normal. - Aortic valve: Moderate focal calcification, consistent with sclerosis. There was mild regurgitation. - Mitral valve: There was mild regurgitation.  EKG:  EKG is  ordered today.  The ekg ordered today demonstrates sinus rhythm 68 normal intervals  Recent Labs: 09/16/2018: ALT 20 05/16/2019: BUN 12; Creatinine, Ser 0.91; Hemoglobin 14.7; Platelets 166; Potassium 4.6; Sodium 142  Recent Lipid Panel No results found for: CHOL, TRIG, HDL, CHOLHDL, VLDL, LDLCALC, LDLDIRECT  Physical Exam:    VS:  BP  140/80    Pulse 68    Ht 5\' 3"  (1.6 m)    Wt 212 lb 3.2 oz (96.3 kg)    SpO2 95%    BMI 37.59 kg/m     Wt Readings from Last 3 Encounters:  06/10/19 212 lb 3.2 oz (96.3 kg)  05/30/19 260 lb 9.3 oz (118.2 kg)  04/08/19 214 lb (97.1 kg)     GEN:  Well nourished, well developed in no acute distress, obese HEENT: Normal NECK: No JVD; No carotid bruits LYMPHATICS: No lymphadenopathy CARDIAC: RRR, no murmurs, rubs, gallops RESPIRATORY:  Clear to auscultation without rales, wheezing or rhonchi  ABDOMEN: Soft, non-tender, non-distended MUSCULOSKELETAL:  No edema; No deformity  SKIN: Warm and dry NEUROLOGIC:  Alert and oriented x 3 PSYCHIATRIC:  Normal affect   ASSESSMENT:    1. PAF (paroxysmal atrial fibrillation) ([place])   2. OSA (obstructive sleep apnea)   3. Diabetes mellitus with coincident hypertension ([place])   4. Morbid obesity ([place])    PLAN:    In order of problems listed above:  Paroxysmal atrial fibrillation - Post ablation 05/29/2019 Dr. Curt Bears.  Was having some side effects on flecainide, but she maintained 50 mg on discharge with 50 mg of Toprol-XL as well.. At one point in a previous note it was stated that she was switched from Xarelto to Eliquis because of some melanotic stools and history of GI bleeding, however she did continue with her Xarelto.  She follows with GI, Dr. Cristina Gong, last hemoglobin is been excellent 14.7.  Her chads vasc is 4. -Successful electrical isolation and anatomical encircling of all four pulmonary veins with radiofrequency current; No inducible arrhythmias following ablation both on and off of dobutamine; No early apparent complications. -Her groin did have some minor bleeding so she was kept and observed until stable for discharge. -Overall she is doing quite well, maintaining sinus rhythm heart rate in the 70s at home.  She is still on the flecainide.  Potentially she may be able to come off of this medication in the next several months.  She is going  to be seeing Roderic Palau as well as Dr. Curt Bears in the future.  Obstructive sleep apnea -Continuing to use her CPAP nightly.  Diabetes with hypertension -Medications reviewed controlled.  Hyperlipidemia -Atorvastatin no myalgias.  Morbid obesity -Continue to encourage weight loss.  Hair loss -Did mention  some hair loss on the posterior aspect of her head.  She will ask either Dr. Sharlett Iles or her dermatologist about this.  I do not think that this is cardiac related.   Medication Adjustments/Labs and Tests Ordered: Current medicines are reviewed at length with the patient today.  Concerns regarding medicines are outlined above.  Orders Placed This Encounter  Procedures   EKG 12-Lead   No orders of the defined types were placed in this encounter.   Patient Instructions  Medication Instructions:  The current medical regimen is effective;  continue present plan and medications.  If you need a refill on your cardiac medications before your next appointment, please call your pharmacy.   Follow-Up: At [facility], you and your health needs are our priority.  As part of our continuing mission to provide you with exceptional heart care, we have created designated Provider Care Teams.  These Care Teams include your primary Cardiologist (physician) and Advanced Practice Providers (APPs -  Physician Assistants and Nurse Practitioners) who all work together to provide you with the care you need, when you need it. You will need a follow up appointment in 6 months.  Please call our office 2 months in advance to schedule this appointment.  You may see [name], MD or one of the following Advanced Practice Providers on your designated Care Team:   Truitt Merle, NP Cecilie Kicks, NP  Kathyrn Drown, NP  Thank you for choosing [facility]!!         Signed, [name], MD  06/10/2019 9:32 AM    [place]

## 2019-06-10 NOTE — Progress Notes (Signed)
Cardiology Office Note:    Date:  06/10/2019   ID:  Erika Kim, DOB [birth year], MRN [id number]  PCP:  [name], MD  Cardiologist:  [name], MD  Electrophysiologist:  [name], MD   Referring MD: [name], MD   Post atrial fibrillation follow-up, ablation  History of Present Illness:    Erika Kim is a 73 y.o. female with a hx of atrial fibrillation post ablation performed on 05/29/2019 by Dr. Curt Bears.  Has hypertension hyperlipidemia paroxysmal atrial fibrillation diabetes obstructive sleep apnea on CPAP.  She was having occasional episodes of A. fib that was increased despite flecainide.  She was also having some side effects of flecainide.  She was admitted to the hospital with symptomatic anemia and melanotic stools and her Xarelto was stopped at one point.  Capsule endoscopy showed oozing from multiple low-grade lesions.  Because of continued episodes of A. fib she had ablation performed by Dr. Curt Bears.  Finally after COVID restrictions, she was able to have her ablation performed on 05/29/2019.  EKG prior to ablation shows sinus rhythm rate 64.  Hemoglobin was 14.7.  Creatinine 0.9.  Walked 40 min this AM. Feels good. No CP, SOB only with hill. No bleeding.  She is still taking Xarelto (was off Dr. Cristina Gong for 6 weeks).  Overall she has not seen any melanotic stools.  Hemoglobin remained stable at 14.7 on 05/16/2019.  Hemoglobin A1c 5.8.   Past Medical History:  Diagnosis Date   Acoustic neuroma [facility]) followed by dr brown at [facility]   right side w/ chronic roaring noise   Anticoagulant long-term use    xarelto   Benign paroxysmal positional vertigo    Endometrial polyp    Frequency of urination    Hyperlipidemia    Hypertension    OA (osteoarthritis)    left knee   OSA on CPAP    PAF (paroxysmal atrial fibrillation) [facility]) cardiologist-  dr Marlou Porch   dx 04/ 2017   PMB (postmenopausal bleeding)    TMJ (temporomandibular  joint disorder)    left side-- wears guard   Type 2 diabetes mellitus ([place])    Wears contact lenses     Past Surgical History:  Procedure Laterality Date   ATRIAL FIBRILLATION ABLATION  05/29/2019   ATRIAL FIBRILLATION ABLATION N/A 05/29/2019   Procedure: ATRIAL FIBRILLATION ABLATION;  Surgeon: [name], MD;  Location: [place] [facility];  Service: Cardiovascular;  Laterality: N/A;   CARPAL TUNNEL RELEASE Right 04/17/2007   w/ Excision ganglion cyst and Pulley Release right thumb   DILATATION & CURETTAGE/HYSTEROSCOPY WITH MYOSURE N/A 01/12/2017   Procedure: DILATATION & CURETTAGE/HYSTEROSCOPY WITH MYOSURE;  Surgeon: Arvella Nigh, MD;  Location: [place];  Service: Gynecology;  Laterality: N/A;   ESOPHAGOGASTRODUODENOSCOPY (EGD) WITH PROPOFOL N/A 09/16/2018   Procedure: ESOPHAGOGASTRODUODENOSCOPY (EGD) WITH PROPOFOL;  Surgeon: Ronald Lobo, MD;  Location: [place];  Service: Endoscopy;  Laterality: N/A;   GIVENS CAPSULE STUDY N/A 09/16/2018   Procedure: GIVENS CAPSULE STUDY;  Surgeon: Ronald Lobo, MD;  Location: [place];  Service: Endoscopy;  Laterality: N/A;   HAMMER TOE SURGERY Left 2010   left second toe   KNEE ARTHROSCOPY Right 2010   KNEE SURGERY Left 1993   LYMPH NODE BIOPSY N/A 03/01/2017   Procedure: SENTINEL LYMPH NODE BIOPSY;  Surgeon: Everitt Amber, MD;  Location: [facility];  Service: Gynecology;  Laterality: N/A;   NASAL SINUS SURGERY  1985 and [place]  WITH BILATERAL SALPINGO OOPHERECTOMY Bilateral 03/01/2017   Procedure: XI ROBOTIC ASSISTED TOTAL HYSTERECTOMY WITH BILATERAL SALPINGO OOPHORECTOMY;  Surgeon: Everitt Amber, MD;  Location: [facility];  Service: Gynecology;  Laterality: Bilateral;   ROTATOR CUFF REPAIR Right 04/2004   TOE SURGERY Right 2012   Great toe and second toe   TOTAL ELBOW REPLACEMENT Right 2000   TOTAL KNEE ARTHROPLASTY Right 01/12/2010   TRANSTHORACIC ECHOCARDIOGRAM   03/28/2016   ef 60-65%/ mild AR and MR   TUBAL LIGATION Bilateral 1978    Current Medications: Current Meds  Medication Sig   acetaminophen (TYLENOL) 500 MG tablet Take 1,000 mg by mouth every 8 (eight) hours as needed for moderate pain or headache.    atorvastatin (LIPITOR) 40 MG tablet Take 40 mg by mouth every morning.    betamethasone valerate (VALISONE) 0.1 % cream Apply 1 application topically 2 (two) times a week.    Cholecalciferol (VITAMIN D3) 5000 units TABS Take 5,000 Units by mouth daily.    flecainide (TAMBOCOR) 50 MG tablet TAKE ONE TABLET BY MOUTH TWICE DAILY    ipratropium (ATROVENT) 0.03 % nasal spray Place 2 sprays into both nostrils every 12 (twelve) hours as needed for rhinitis.   JANUVIA 100 MG tablet Take 100 mg by mouth daily after supper.    Lancets (ONETOUCH DELICA PLUS [id number]) MISC    lisinopril-hydrochlorothiazide (PRINZIDE,ZESTORETIC) 20-12.5 MG per tablet Take 1 tablet by mouth every morning.    metoprolol succinate (TOPROL-XL) 50 MG 24 hr tablet Take 50 mg by mouth 2 (two) times daily. May take a third time if heart goes into a-fib   Multiple Vitamin (MULTIVITAMIN) tablet Take 1 tablet by mouth daily.   ONE TOUCH ULTRA TEST test strip    Polyethyl Glycol-Propyl Glycol (SYSTANE OP) Place 1 drop into both eyes daily as needed (dry eyes).   rivaroxaban (XARELTO) 20 MG TABS tablet TAKE ONE TABLET BY MOUTH ONE TIME DAILY WITH SUPPER     Allergies:   Cephalosporins and Augmentin [amoxicillin-pot clavulanate]   Social History   Socioeconomic History   Marital status: Married    Spouse name: Not on file   Number of children: Not on file   Years of education: Not on file   Highest education level: Not on file  Occupational History   Not on file  Social Needs   Financial resource strain: Not on file   Food insecurity    Worry: Not on file    Inability: Not on file   Transportation needs    Medical: Not on file    Non-medical:  Not on file  Tobacco Use   Smoking status: Never Smoker   Smokeless tobacco: Never Used  Substance and Sexual Activity   Alcohol use: No   Drug use: No   Sexual activity: Not on file  Lifestyle   Physical activity    Days per week: Not on file    Minutes per session: Not on file   Stress: Not on file  Relationships   Social connections    Talks on phone: Not on file    Gets together: Not on file    Attends religious service: Not on file    Active member of club or organization: Not on file    Attends meetings of clubs or organizations: Not on file    Relationship status: Not on file  Other Topics Concern   Not on file  Social History Narrative   Married w/2 children (boy and girl).  Retired.  Family History: The patient's family history includes Heart failure in her father and mother.  ROS:   Please see the history of present illness.    Denies any melena, chest pain, minimal shortness of breath with activity all other systems reviewed and are negative.  EKGs/Labs/Other Studies Reviewed:    The following studies were reviewed today: ECHO - Left ventricle: The cavity size was normal. Systolic function was normal. The estimated ejection fraction was in the range of 60% to 65%. Wall motion was normal; there were no regional wall motion abnormalities. The transmitral flow pattern was normal. Left ventricular diastolic function parameters were normal. - Aortic valve: Moderate focal calcification, consistent with sclerosis. There was mild regurgitation. - Mitral valve: There was mild regurgitation.  EKG:  EKG is  ordered today.  The ekg ordered today demonstrates sinus rhythm 68 normal intervals  Recent Labs: 09/16/2018: ALT 20 05/16/2019: BUN 12; Creatinine, Ser 0.91; Hemoglobin 14.7; Platelets 166; Potassium 4.6; Sodium 142  Recent Lipid Panel No results found for: CHOL, TRIG, HDL, CHOLHDL, VLDL, LDLCALC, LDLDIRECT  Physical Exam:    VS:  BP  140/80    Pulse 68    Ht 5\' 3"  (1.6 m)    Wt 212 lb 3.2 oz (96.3 kg)    SpO2 95%    BMI 37.59 kg/m     Wt Readings from Last 3 Encounters:  06/10/19 212 lb 3.2 oz (96.3 kg)  05/30/19 260 lb 9.3 oz (118.2 kg)  04/08/19 214 lb (97.1 kg)     GEN:  Well nourished, well developed in no acute distress, obese HEENT: Normal NECK: No JVD; No carotid bruits LYMPHATICS: No lymphadenopathy CARDIAC: RRR, no murmurs, rubs, gallops RESPIRATORY:  Clear to auscultation without rales, wheezing or rhonchi  ABDOMEN: Soft, non-tender, non-distended MUSCULOSKELETAL:  No edema; No deformity  SKIN: Warm and dry NEUROLOGIC:  Alert and oriented x 3 PSYCHIATRIC:  Normal affect   ASSESSMENT:    1. PAF (paroxysmal atrial fibrillation) ([place])   2. OSA (obstructive sleep apnea)   3. Diabetes mellitus with coincident hypertension ([place])   4. Morbid obesity ([place])    PLAN:    In order of problems listed above:  Paroxysmal atrial fibrillation - Post ablation 05/29/2019 Dr. Curt Bears.  Was having some side effects on flecainide, but she maintained 50 mg on discharge with 50 mg of Toprol-XL as well.. At one point in a previous note it was stated that she was switched from Xarelto to Eliquis because of some melanotic stools and history of GI bleeding, however she did continue with her Xarelto.  She follows with GI, Dr. Cristina Gong, last hemoglobin is been excellent 14.7.  Her chads vasc is 4. -Successful electrical isolation and anatomical encircling of all four pulmonary veins with radiofrequency current; No inducible arrhythmias following ablation both on and off of dobutamine; No early apparent complications. -Her groin did have some minor bleeding so she was kept and observed until stable for discharge. -Overall she is doing quite well, maintaining sinus rhythm heart rate in the 70s at home.  She is still on the flecainide.  Potentially she may be able to come off of this medication in the next several months.  She is going  to be seeing Roderic Palau as well as Dr. Curt Bears in the future.  Obstructive sleep apnea -Continuing to use her CPAP nightly.  Diabetes with hypertension -Medications reviewed controlled.  Hyperlipidemia -Atorvastatin no myalgias.  Morbid obesity -Continue to encourage weight loss.  Hair loss -Did mention  some hair loss on the posterior aspect of her head.  She will ask either Dr. Sharlett Iles or her dermatologist about this.  I do not think that this is cardiac related.   Medication Adjustments/Labs and Tests Ordered: Current medicines are reviewed at length with the patient today.  Concerns regarding medicines are outlined above.  Orders Placed This Encounter  Procedures   EKG 12-Lead   No orders of the defined types were placed in this encounter.   Patient Instructions  Medication Instructions:  The current medical regimen is effective;  continue present plan and medications.  If you need a refill on your cardiac medications before your next appointment, please call your pharmacy.   Follow-Up: At [facility], you and your health needs are our priority.  As part of our continuing mission to provide you with exceptional heart care, we have created designated Provider Care Teams.  These Care Teams include your primary Cardiologist (physician) and Advanced Practice Providers (APPs -  Physician Assistants and Nurse Practitioners) who all work together to provide you with the care you need, when you need it. You will need a follow up appointment in 6 months.  Please call our office 2 months in advance to schedule this appointment.  You may see [name], MD or one of the following Advanced Practice Providers on your designated Care Team:   Truitt Merle, NP Cecilie Kicks, NP  Kathyrn Drown, NP  Thank you for choosing [facility]!!         Signed, [name], MD  06/10/2019 9:32 AM    [place]

## 2019-06-12 DIAGNOSIS — R69 Illness, unspecified: Secondary | ICD-10-CM | POA: Diagnosis not present

## 2019-06-16 ENCOUNTER — Telehealth: Payer: Self-pay | Admitting: Cardiology

## 2019-06-16 NOTE — Telephone Encounter (Signed)
Spoke with pt and has noted headache , elevated B/P, and heart rate this afternoon 155/91 and HR 122 Per pt has been good up until today  Encouraged pt to continue to monitor and to call later this week with readings Will forward Dr Marlou Porch and Dr Curt Bears  for review and recommendations./cy

## 2019-06-16 NOTE — Telephone Encounter (Signed)
° ° °  Patient c/o Palpitations:  High priority if patient c/o lightheadedness, shortness of breath, or chest pain  1) How long have you had palpitations/irregular HR/ Afib? Are you having the symptoms now? Started today around 1:30 today  2) Are you currently experiencing lightheadedness, SOB or CP? Headache  3) Do you have a history of afib (atrial fibrillation) or irregular heart rhythm? yes  4) Have you checked your BP or HR? (document readings if available): 163/98 HR 123  5) Are you experiencing any other symptoms? Headache

## 2019-06-17 NOTE — Telephone Encounter (Signed)
Recommend taking additional Toprol as well as additional Flecainide. If does not break soon, may require cardioversion.  Dr. Curt Bears to respond as well. (post recent AFIB ablation).  [name], MD

## 2019-06-17 NOTE — Telephone Encounter (Signed)
Needs follow up in AF clinc to discuss possible cardioversion.

## 2019-06-18 NOTE — Telephone Encounter (Signed)
Lm for pt to call back ./cy 

## 2019-06-18 NOTE — Telephone Encounter (Signed)
Spoke with pt and feels some better today was able to sleep last night HR was 84-103 yesterday Also pt walked this am Pt aware of Dr Marlou Porch and [facility] recommendations.Will forward message to see if Afib appt can be moved up ./cy

## 2019-06-18 NOTE — Telephone Encounter (Signed)
Pt states she feels back in rhythm today - does not want to move appt up at this time. Pt will call if afib returns and we will move appt up at that point.

## 2019-06-18 NOTE — Telephone Encounter (Signed)
Left message to return my call to discuss.

## 2019-06-19 ENCOUNTER — Telehealth (HOSPITAL_COMMUNITY): Payer: Self-pay | Admitting: *Deleted

## 2019-06-19 NOTE — Telephone Encounter (Signed)
Patient called in this morning stating she woke up again in AF with heart rates in the 100-120s. BP 116/-140s/70-80s feels ok just jittery inside. Discussed with Roderic Palau NP - will increase metoprolol to 75mg  BID and call tomorrow with update. Pt in agreement.

## 2019-06-19 NOTE — Telephone Encounter (Signed)
Thank you for the update.  Agree with plan. [name], MD

## 2019-06-20 ENCOUNTER — Encounter (HOSPITAL_COMMUNITY): Payer: Self-pay | Admitting: Physician Assistant

## 2019-06-20 ENCOUNTER — Other Ambulatory Visit (HOSPITAL_COMMUNITY)
Admission: RE | Admit: 2019-06-20 | Discharge: 2019-06-20 | Disposition: A | Discharge status: Home / Self Care | Payer: Medicare HMO | Source: Ambulatory Visit | Attending: Cardiovascular Disease | Admitting: Cardiovascular Disease

## 2019-06-20 ENCOUNTER — Ambulatory Visit (HOSPITAL_COMMUNITY)
Admission: RE | Admit: 2019-06-20 | Discharge: 2019-06-20 | Disposition: A | Discharge status: Home / Self Care | Payer: Medicare HMO | Source: Ambulatory Visit | Attending: Physician Assistant | Admitting: Physician Assistant

## 2019-06-20 ENCOUNTER — Other Ambulatory Visit: Payer: Self-pay

## 2019-06-20 VITALS — BP 150/90 | HR 122 | Ht 63.0 in | Wt 212.0 lb

## 2019-06-20 DIAGNOSIS — E669 Obesity, unspecified: Secondary | ICD-10-CM | POA: Diagnosis not present

## 2019-06-20 DIAGNOSIS — G4733 Obstructive sleep apnea (adult) (pediatric): Secondary | ICD-10-CM | POA: Diagnosis not present

## 2019-06-20 DIAGNOSIS — I4892 Unspecified atrial flutter: Secondary | ICD-10-CM | POA: Insufficient documentation

## 2019-06-20 DIAGNOSIS — Z7984 Long term (current) use of oral hypoglycemic drugs: Secondary | ICD-10-CM | POA: Diagnosis not present

## 2019-06-20 DIAGNOSIS — Z20828 Contact with and (suspected) exposure to other viral communicable diseases: Secondary | ICD-10-CM | POA: Diagnosis not present

## 2019-06-20 DIAGNOSIS — E785 Hyperlipidemia, unspecified: Secondary | ICD-10-CM | POA: Diagnosis not present

## 2019-06-20 DIAGNOSIS — Z01812 Encounter for preprocedural laboratory examination: Secondary | ICD-10-CM | POA: Diagnosis not present

## 2019-06-20 DIAGNOSIS — Z7901 Long term (current) use of anticoagulants: Secondary | ICD-10-CM | POA: Insufficient documentation

## 2019-06-20 DIAGNOSIS — I480 Paroxysmal atrial fibrillation: Secondary | ICD-10-CM | POA: Diagnosis not present

## 2019-06-20 DIAGNOSIS — I483 Typical atrial flutter: Secondary | ICD-10-CM | POA: Diagnosis not present

## 2019-06-20 DIAGNOSIS — Z881 Allergy status to other antibiotic agents status: Secondary | ICD-10-CM | POA: Diagnosis not present

## 2019-06-20 DIAGNOSIS — Z8249 Family history of ischemic heart disease and other diseases of the circulatory system: Secondary | ICD-10-CM | POA: Diagnosis not present

## 2019-06-20 DIAGNOSIS — Z880 Allergy status to penicillin: Secondary | ICD-10-CM | POA: Diagnosis not present

## 2019-06-20 DIAGNOSIS — Z79899 Other long term (current) drug therapy: Secondary | ICD-10-CM | POA: Diagnosis not present

## 2019-06-20 DIAGNOSIS — Z6837 Body mass index (BMI) 37.0-37.9, adult: Secondary | ICD-10-CM | POA: Insufficient documentation

## 2019-06-20 DIAGNOSIS — Z9889 Other specified postprocedural states: Secondary | ICD-10-CM | POA: Insufficient documentation

## 2019-06-20 DIAGNOSIS — M1990 Unspecified osteoarthritis, unspecified site: Secondary | ICD-10-CM | POA: Insufficient documentation

## 2019-06-20 DIAGNOSIS — E119 Type 2 diabetes mellitus without complications: Secondary | ICD-10-CM | POA: Diagnosis not present

## 2019-06-20 DIAGNOSIS — I10 Essential (primary) hypertension: Secondary | ICD-10-CM | POA: Diagnosis not present

## 2019-06-20 LAB — CBC
HCT: 46.7 % — ABNORMAL HIGH (ref 36.0–46.0)
Hemoglobin: 15.7 g/dL — ABNORMAL HIGH (ref 12.0–15.0)
MCH: 29.5 pg (ref 26.0–34.0)
MCHC: 33.6 g/dL (ref 30.0–36.0)
MCV: 87.6 fL (ref 80.0–100.0)
Platelets: 174 10*3/uL (ref 150–400)
RBC: 5.33 MIL/uL — ABNORMAL HIGH (ref 3.87–5.11)
RDW: 12.7 % (ref 11.5–15.5)
WBC: 6.2 10*3/uL (ref 4.0–10.5)
nRBC: 0 % (ref 0.0–0.2)

## 2019-06-20 LAB — BASIC METABOLIC PANEL
Anion gap: 13 (ref 5–15)
BUN: 14 mg/dL (ref 8–23)
CO2: 21 mmol/L — ABNORMAL LOW (ref 22–32)
Calcium: 9.7 mg/dL (ref 8.9–10.3)
Chloride: 102 mmol/L (ref 98–111)
Creatinine, Ser: 0.68 mg/dL (ref 0.44–1.00)
GFR calc Af Amer: >60 mL/min (ref >60)
GFR calc non Af Amer: >60 mL/min (ref >60)
Glucose, Bld: 96 mg/dL (ref 70–99)
Potassium: 4.3 mmol/L (ref 3.5–5.1)
Sodium: 136 mmol/L (ref 135–145)

## 2019-06-20 LAB — SARS CORONAVIRUS 2 (TAT 6-24 HRS): SARS Coronavirus 2: NEGATIVE

## 2019-06-20 NOTE — Progress Notes (Signed)
Primary Care Physician: [name], MD Primary Cardiologist: Dr Marlou Porch Primary Electrophysiologist: Dr Curt Bears Referring Physician: Dr Burlene Arnt is a 73 y.o. female with a history of hypertension, hyperlipidemia, paroxysmal atrial fibrillation, diabetes, and OSA on CPAP who presents for follow up in the [facility]. Patient recently underwent afib ablation with Dr Curt Bears on 05/29/19. She reports that she felt "great" for over two weeks. Unfortunately, she has had episodes of heart racing and feeling "jittery inside." Her heart rates at home have been 100-120s. She has not tried increasing her BB yet. There were no triggering events that the patient could identify. She denies any significant alcohol use. She is compliant with her CPAP therapy.  Today, she denies symptoms of chest pain, shortness of breath, orthopnea, PND, lower extremity edema, dizziness, presyncope, syncope, snoring, daytime somnolence, bleeding, or neurologic sequela. The patient is tolerating medications without difficulties and is otherwise without complaint today.    Atrial Fibrillation Risk Factors:  she does have symptoms or diagnosis of sleep apnea. she is compliant with CPAP therapy. she does not have a history of rheumatic fever. she does not have a history of alcohol use. The patient does have a history of early familial atrial fibrillation or other arrhythmias. Father had PPM.  she has a BMI of Body mass index is 37.55 kg/m.Marland Kitchen Filed Weights   06/20/19 1138  Weight: 96.2 kg    Family History  Problem Relation Age of Onset  . Heart failure Father   . Heart failure Mother      Atrial Fibrillation Management history:  Previous antiarrhythmic drugs: flecainide Previous cardioversions: none Previous ablations: 05/29/19 CHADS2VASC score: 4 Anticoagulation history: Xarelto   Past Medical History:  Diagnosis Date  . Acoustic neuroma ([place]) followed by dr  brown at [facility]   right side w/ chronic roaring noise  . Anticoagulant long-term use    xarelto  . Benign paroxysmal positional vertigo   . Endometrial polyp   . Frequency of urination   . Hyperlipidemia   . Hypertension   . OA (osteoarthritis)    left knee  . OSA on CPAP   . PAF (paroxysmal atrial fibrillation) [facility]) cardiologist-  dr Marlou Porch   dx 04/ 2017  . PMB (postmenopausal bleeding)   . TMJ (temporomandibular joint disorder)    left side-- wears guard  . Type 2 diabetes mellitus ([place])   . Wears contact lenses    Past Surgical History:  Procedure Laterality Date  . ATRIAL FIBRILLATION ABLATION  05/29/2019  . ATRIAL FIBRILLATION ABLATION N/A 05/29/2019   Procedure: ATRIAL FIBRILLATION ABLATION;  Surgeon: [name], MD;  Location: [facility];  Service: Cardiovascular;  Laterality: N/A;  . CARPAL TUNNEL RELEASE Right 04/17/2007   w/ Excision ganglion cyst and Pulley Release right thumb  . DILATATION & CURETTAGE/HYSTEROSCOPY WITH MYOSURE N/A 01/12/2017   Procedure: DILATATION & CURETTAGE/HYSTEROSCOPY WITH MYOSURE;  Surgeon: Arvella Nigh, MD;  Location: [facility];  Service: Gynecology;  Laterality: N/A;  . ESOPHAGOGASTRODUODENOSCOPY (EGD) WITH PROPOFOL N/A 09/16/2018   Procedure: ESOPHAGOGASTRODUODENOSCOPY (EGD) WITH PROPOFOL;  Surgeon: Ronald Lobo, MD;  Location: [facility];  Service: Endoscopy;  Laterality: N/A;  . GIVENS CAPSULE STUDY N/A 09/16/2018   Procedure: GIVENS CAPSULE STUDY;  Surgeon: Ronald Lobo, MD;  Location: [facility];  Service: Endoscopy;  Laterality: N/A;  . HAMMER TOE SURGERY Left 2010   left second toe  . KNEE ARTHROSCOPY Right 2010  . KNEE SURGERY  Left 1993  . LYMPH NODE BIOPSY N/A 03/01/2017   Procedure: SENTINEL LYMPH NODE BIOPSY;  Surgeon: Everitt Amber, MD;  Location: [facility];  Service: Gynecology;  Laterality: N/A;  . NASAL SINUS SURGERY  1985 and 1995  . ROBOTIC ASSISTED TOTAL HYSTERECTOMY WITH BILATERAL  SALPINGO OOPHERECTOMY Bilateral 03/01/2017   Procedure: XI ROBOTIC ASSISTED TOTAL HYSTERECTOMY WITH BILATERAL SALPINGO OOPHORECTOMY;  Surgeon: Everitt Amber, MD;  Location: [facility];  Service: Gynecology;  Laterality: Bilateral;  . ROTATOR CUFF REPAIR Right 04/2004  . TOE SURGERY Right 2012   Great toe and second toe  . TOTAL ELBOW REPLACEMENT Right 2000  . TOTAL KNEE ARTHROPLASTY Right 01/12/2010  . TRANSTHORACIC ECHOCARDIOGRAM  03/28/2016   ef 60-65%/ mild AR and MR  . TUBAL LIGATION Bilateral 1978    Current Outpatient Medications  Medication Sig Dispense Refill  . acetaminophen (TYLENOL) 500 MG tablet Take 1,000 mg by mouth every 8 (eight) hours as needed for moderate pain or headache.     Marland Kitchen atorvastatin (LIPITOR) 40 MG tablet Take 40 mg by mouth every morning.     . betamethasone valerate (VALISONE) 0.1 % cream Apply 1 application topically 2 (two) times a week.     . Cholecalciferol (VITAMIN D3) 5000 units TABS Take 5,000 Units by mouth daily.     . flecainide (TAMBOCOR) 50 MG tablet TAKE ONE TABLET BY MOUTH TWICE DAILY  180 tablet 1  . ipratropium (ATROVENT) 0.03 % nasal spray Place 2 sprays into both nostrils every 12 (twelve) hours as needed for rhinitis.    Marland Kitchen JANUVIA 100 MG tablet Take 100 mg by mouth daily after supper.     . Lancets (ONETOUCH DELICA PLUS [id number]) [place]     . lisinopril-hydrochlorothiazide (PRINZIDE,ZESTORETIC) 20-12.5 MG per tablet Take 1 tablet by mouth every morning.     . metoprolol succinate (TOPROL-XL) 50 MG 24 hr tablet Take 75 mg by mouth 2 (two) times daily. May take a third time if heart goes into a-fib    . Multiple Vitamin (MULTIVITAMIN) tablet Take 1 tablet by mouth daily.    . ONE TOUCH ULTRA TEST test strip     . Polyethyl Glycol-Propyl Glycol (SYSTANE OP) Place 1 drop into both eyes daily as needed (dry eyes).    . rivaroxaban (XARELTO) 20 MG TABS tablet TAKE ONE TABLET BY MOUTH ONE TIME DAILY WITH SUPPER 90 tablet 1   No current  facility-administered medications for this encounter.     Allergies  Allergen Reactions  . Cephalosporins Itching    severe  . Augmentin [Amoxicillin-Pot Clavulanate] Itching    severe    Social History   Socioeconomic History  . Marital status: Married    Spouse name: Not on file  . Number of children: Not on file  . Years of education: Not on file  . Highest education level: Not on file  Occupational History  . Not on file  Social Needs  . Financial resource strain: Not on file  . Food insecurity    Worry: Not on file    Inability: Not on file  . Transportation needs    Medical: Not on file    Non-medical: Not on file  Tobacco Use  . Smoking status: Never Smoker  . Smokeless tobacco: Never Used  Substance and Sexual Activity  . Alcohol use: No  . Drug use: No  . Sexual activity: Not on file  Lifestyle  . Physical activity    Days per week: Not on file  Minutes per session: Not on file  . Stress: Not on file  Relationships  . Social Herbalist on phone: Not on file    Gets together: Not on file    Attends religious service: Not on file    Active member of club or organization: Not on file    Attends meetings of clubs or organizations: Not on file    Relationship status: Not on file  . Intimate partner violence    Fear of current or ex partner: Not on file    Emotionally abused: Not on file    Physically abused: Not on file    Forced sexual activity: Not on file  Other Topics Concern  . Not on file  Social History Narrative   Married w/2 children (boy and girl).  Retired.     ROS- All systems are reviewed and negative except as per the HPI above.  Physical Exam: Vitals:   06/20/19 1138  BP: (!) 150/90  Pulse: (!) 122  Weight: 96.2 kg  Height: 5\' 3"  (1.6 m)    GEN- The patient is well appearing obese female, alert and oriented x 3 today.   Head- normocephalic, atraumatic Eyes-  Sclera clear, conjunctiva pink Ears- hearing intact  Oropharynx- clear Neck- supple  Lungs- Clear to ausculation bilaterally, normal work of breathing Heart- Regular rhythm, tachycardia, no murmurs, rubs or gallops  GI- soft, NT, ND, + BS Extremities- no clubbing, cyanosis, or edema MS- no significant deformity or atrophy Skin- no rash or lesion Psych- euthymic mood, full affect Neuro- strength and sensation are intact  Wt Readings from Last 3 Encounters:  06/20/19 96.2 kg  06/10/19 96.3 kg  05/30/19 118.2 kg    EKG today demonstrates typical atrial flutter with 2:1 conduction. HR 122, QRS 84, QTc 458  Echo 03/28/16 demonstrated  - Left ventricle: The cavity size was normal. Systolic function was   normal. The estimated ejection fraction was in the range of 60%   to 65%. Wall motion was normal; there were no regional wall   motion abnormalities. The transmitral flow pattern was normal.   Left ventricular diastolic function parameters were normal. - Aortic valve: Moderate focal calcification, consistent with   sclerosis. There was mild regurgitation. - Mitral valve: There was mild regurgitation.   Epic records are reviewed at length today  Assessment and Plan:  1. Paroxysmal atrial fibrillation/atrial flutter S/p ablation with Dr Curt Bears 05/29/19. Patient now appears to be in atrial flutter. FitBit shows persistently elevated heart rates. Increase metoprolol to 75 mg BID until DCCV for rate control. Continue flecainide 50 mg BID. Will arrange DCCV. Patient reports no missed doses of anticoagulation. Continue Xarelto 20 mg daily. CBC/Bmet today. We also discussed possibility of atrial flutter ablation in the future should she have recurrence.   This patients CHA2DS2-VASc Score and unadjusted Ischemic Stroke Rate (% per year) is equal to 4.8 % stroke rate/year from a score of 4  Above score calculated as 1 point each if present [CHF, HTN, DM, Vascular=MI/PAD/Aortic Plaque, Age if 65-74, or Female] Above score calculated as 2  points each if present [Age > 75, or Stroke/TIA/TE]   2. Obesity Body mass index is 37.55 kg/m. Lifestyle modification was discussed at length including regular exercise and weight reduction. Patient has done well walking daily.  3. Obstructive sleep apnea The importance of adequate treatment of sleep apnea was discussed today in order to improve our ability to maintain sinus rhythm long term. Compliant with  CPAP therapy.   4. HTN Elevated today, med changes as above.   Follow up one week post DCCV.   [facility] [address] [place], [place] [address] [phone] 06/20/2019 12:20 PM

## 2019-06-20 NOTE — Patient Instructions (Signed)
Cardioversion scheduled for Tuesday, September 1st  - Arrive at the [facility] and go to admitting at [place] not eat or drink anything after midnight the night prior to your procedure.  - Take all your morning medication with a sip of water prior to arrival.  - You will not be able to drive home after your procedure.   Increase metoprolol to 75mg  (1 & 1/2 tablets of your 50mg ) twice a day until Tuesday - then go back to metoprolol 50mg  twice a day

## 2019-06-20 NOTE — Telephone Encounter (Signed)
Pt continues with HR in the 120s. Will bring in for assessment for further med adjustments.

## 2019-06-23 DIAGNOSIS — R69 Illness, unspecified: Secondary | ICD-10-CM | POA: Diagnosis not present

## 2019-06-23 NOTE — Progress Notes (Signed)
Pre-procedure call made to patient, confirmed she has been quarantined and not had any symptoms of being sick. Answered all questions.

## 2019-06-24 ENCOUNTER — Other Ambulatory Visit: Payer: Self-pay

## 2019-06-24 ENCOUNTER — Encounter (HOSPITAL_COMMUNITY): Payer: Self-pay | Admitting: Cardiovascular Disease

## 2019-06-24 ENCOUNTER — Ambulatory Visit (HOSPITAL_COMMUNITY)
Admission: RE | Admit: 2019-06-24 | Discharge: 2019-06-24 | Disposition: A | Discharge status: Home / Self Care | Payer: Medicare HMO | Attending: Cardiovascular Disease | Admitting: Cardiovascular Disease

## 2019-06-24 ENCOUNTER — Ambulatory Visit (HOSPITAL_COMMUNITY): Payer: Medicare HMO | Admitting: Anesthesiology

## 2019-06-24 ENCOUNTER — Encounter (HOSPITAL_COMMUNITY)
Admission: RE | Disposition: A | Discharge status: Home / Self Care | Payer: Self-pay | Source: Home / Self Care | Attending: Cardiovascular Disease

## 2019-06-24 DIAGNOSIS — Z79899 Other long term (current) drug therapy: Secondary | ICD-10-CM | POA: Insufficient documentation

## 2019-06-24 DIAGNOSIS — I4892 Unspecified atrial flutter: Secondary | ICD-10-CM | POA: Diagnosis not present

## 2019-06-24 DIAGNOSIS — Z880 Allergy status to penicillin: Secondary | ICD-10-CM | POA: Diagnosis not present

## 2019-06-24 DIAGNOSIS — Z7984 Long term (current) use of oral hypoglycemic drugs: Secondary | ICD-10-CM | POA: Insufficient documentation

## 2019-06-24 DIAGNOSIS — I4819 Other persistent atrial fibrillation: Secondary | ICD-10-CM | POA: Diagnosis not present

## 2019-06-24 DIAGNOSIS — I480 Paroxysmal atrial fibrillation: Secondary | ICD-10-CM | POA: Insufficient documentation

## 2019-06-24 DIAGNOSIS — G4733 Obstructive sleep apnea (adult) (pediatric): Secondary | ICD-10-CM | POA: Diagnosis not present

## 2019-06-24 DIAGNOSIS — E785 Hyperlipidemia, unspecified: Secondary | ICD-10-CM | POA: Insufficient documentation

## 2019-06-24 DIAGNOSIS — Z881 Allergy status to other antibiotic agents status: Secondary | ICD-10-CM | POA: Diagnosis not present

## 2019-06-24 DIAGNOSIS — I10 Essential (primary) hypertension: Secondary | ICD-10-CM | POA: Diagnosis not present

## 2019-06-24 DIAGNOSIS — Z7901 Long term (current) use of anticoagulants: Secondary | ICD-10-CM | POA: Diagnosis not present

## 2019-06-24 DIAGNOSIS — E119 Type 2 diabetes mellitus without complications: Secondary | ICD-10-CM | POA: Diagnosis not present

## 2019-06-24 DIAGNOSIS — M1990 Unspecified osteoarthritis, unspecified site: Secondary | ICD-10-CM | POA: Diagnosis not present

## 2019-06-24 DIAGNOSIS — I080 Rheumatic disorders of both mitral and aortic valves: Secondary | ICD-10-CM | POA: Diagnosis not present

## 2019-06-24 DIAGNOSIS — I4891 Unspecified atrial fibrillation: Secondary | ICD-10-CM | POA: Diagnosis not present

## 2019-06-24 DIAGNOSIS — Z6837 Body mass index (BMI) 37.0-37.9, adult: Secondary | ICD-10-CM | POA: Insufficient documentation

## 2019-06-24 HISTORY — PX: CARDIOVERSION: SHX1299

## 2019-06-24 SURGERY — CARDIOVERSION
Anesthesia: General

## 2019-06-24 MED ORDER — SODIUM CHLORIDE 0.9 % IV SOLN
INTRAVENOUS | Status: DC | PRN
  Administered 2019-06-24: 14:00:00 via INTRAVENOUS

## 2019-06-24 MED ORDER — PROPOFOL 10 MG/ML IV BOLUS
INTRAVENOUS | Status: DC | PRN
  Administered 2019-06-24: 70 mg via INTRAVENOUS

## 2019-06-24 MED ORDER — LIDOCAINE 2% (20 MG/ML) 5 ML SYRINGE
INTRAMUSCULAR | Status: DC | PRN
  Administered 2019-06-24: 40 mg via INTRAVENOUS

## 2019-06-24 NOTE — Anesthesia Postprocedure Evaluation (Signed)
Anesthesia Post Note  Patient: Erika Kim  Procedure(s) Performed: CARDIOVERSION (N/A )     Patient location during evaluation: PACU Anesthesia Type: General Level of consciousness: awake and alert and oriented Pain management: pain level controlled Vital Signs Assessment: post-procedure vital signs reviewed and stable Respiratory status: spontaneous breathing, nonlabored ventilation and respiratory function stable Cardiovascular status: blood pressure returned to baseline Postop Assessment: no apparent nausea or vomiting Anesthetic complications: no    Last Vitals:  Vitals:   06/24/19 1439 06/24/19 1447  BP: (!) 155/65 (!) 150/71  Pulse: 77 78  Resp: 16 16  Temp:    SpO2: 100% 100%    Last Pain:  Vitals:   06/24/19 1447  TempSrc:   PainSc: 0-No pain                 Brennan Bailey

## 2019-06-24 NOTE — Anesthesia Preprocedure Evaluation (Addendum)
Anesthesia Evaluation  Patient identified by MRN, date of birth, ID band Patient awake    Reviewed: Allergy & Precautions, NPO status , Patient's Chart, lab work & pertinent test results, reviewed documented beta blocker date and time   History of Anesthesia Complications Negative for: history of anesthetic complications  Airway Mallampati: II  TM Distance: >3 FB Neck ROM: Full    Dental no notable dental hx.    Pulmonary sleep apnea and Continuous Positive Airway Pressure Ventilation ,    Pulmonary exam normal        Cardiovascular hypertension, Pt. on home beta blockers and Pt. on medications Normal cardiovascular exam+ dysrhythmias (on Xarelto) Atrial Fibrillation   TTE 2017: EF 60-65%, mild AVRm mild MVR    Neuro/Psych negative neurological ROS     GI/Hepatic negative GI ROS, Neg liver ROS,   Endo/Other  diabetes, Type 2, Oral Hypoglycemic Agents  Renal/GU negative Renal ROS     Musculoskeletal  (+) Arthritis ,   Abdominal   Peds  Hematology negative hematology ROS (+)   Anesthesia Other Findings Day of surgery medications reviewed with the patient.  Reproductive/Obstetrics                            Anesthesia Physical Anesthesia Plan  ASA: III  Anesthesia Plan: General   Post-op Pain Management:    Induction: Intravenous  PONV Risk Score and Plan: Treatment may vary due to age or medical condition and Propofol infusion  Airway Management Planned: Mask  Additional Equipment: None  Intra-op Plan:   Post-operative Plan:   Informed Consent: I have reviewed the patients History and Physical, chart, labs and discussed the procedure including the risks, benefits and alternatives for the proposed anesthesia with the patient or authorized representative who has indicated his/her understanding and acceptance.     Dental advisory given  Plan Discussed with: CRNA  Anesthesia  Plan Comments:        Anesthesia Quick Evaluation

## 2019-06-24 NOTE — Anesthesia Procedure Notes (Signed)
Procedure Name: MAC Date/Time: 06/24/2019 2:19 PM Performed by: Janene Harvey, CRNA Pre-anesthesia Checklist: Patient identified, Emergency Drugs available and Suction available Oxygen Delivery Method: Ambu bag Dental Injury: Teeth and Oropharynx as per pre-operative assessment

## 2019-06-24 NOTE — CV Procedure (Signed)
Electrical Cardioversion Procedure Note GIA SEES [id number] [birth year]  Procedure: Electrical Cardioversion Indications:  Atrial Fibrillation  Procedure Details Consent: Risks of procedure as well as the alternatives and risks of each were explained to the (patient/caregiver).  Consent for procedure obtained. Time Out: Verified patient identification, verified procedure, site/side was marked, verified correct patient position, special equipment/implants available, medications/allergies/relevent history reviewed, required imaging and test results available.  Performed  Patient placed on cardiac monitor, pulse oximetry, supplemental oxygen as necessary.  Sedation given: propofol Pacer pads placed anterior and posterior chest.  Cardioverted 1 time(s).  Cardioverted at 150J.  Evaluation Findings: Post procedure EKG shows: NSR Complications: None Patient did tolerate procedure well.   Skeet Latch, MD 06/24/2019, 2:29 PM

## 2019-06-24 NOTE — Transfer of Care (Signed)
Immediate Anesthesia Transfer of Care Note  Patient: Erika Kim  Procedure(s) Performed: CARDIOVERSION (N/A )  Patient Location: PACU and Endoscopy Unit  Anesthesia Type:General  Level of Consciousness: drowsy  Airway & Oxygen Therapy: Patient Spontanous Breathing and Patient connected to nasal cannula oxygen  Post-op Assessment: Report given to RN and Post -op Vital signs reviewed and stable  Post vital signs: Reviewed  Last Vitals:  Vitals Value Taken Time  BP    Temp    Pulse    Resp    SpO2      Last Pain:  Vitals:   06/24/19 1319  TempSrc: Oral  PainSc: 0-No pain         Complications: No apparent anesthesia complications

## 2019-06-24 NOTE — Interval H&P Note (Signed)
History and Physical Interval Note:  06/24/2019 2:28 PM  Erika Kim  has presented today for surgery, with the diagnosis of AFIB.  The various methods of treatment have been discussed with the patient and family. After consideration of risks, benefits and other options for treatment, the patient has consented to  Procedure(s): CARDIOVERSION (N/A) as a surgical intervention.  The patient's history has been reviewed, patient examined, no change in status, stable for surgery.  I have reviewed the patient's chart and labs.  Questions were answered to the patient's satisfaction.     Skeet Latch, MD

## 2019-06-25 ENCOUNTER — Telehealth (HOSPITAL_COMMUNITY): Payer: Self-pay | Admitting: *Deleted

## 2019-06-25 MED ORDER — FLECAINIDE ACETATE 50 MG PO TABS: 75.0000 mg | ORAL_TABLET | Freq: Two times a day (BID) | ORAL | 1 refills | Status: DC

## 2019-06-25 NOTE — Telephone Encounter (Signed)
Patient called in this AM with HR in the 120s not feeling well - felt great yesterday after cardioversion. This afternoon her HR has settled out in the 70-80s and more steady feels some better. Discussed with Adline Peals PA will increase flecainide to 75mg  BID and follow up next week with EKG. Pt verbalized understanding.

## 2019-07-01 ENCOUNTER — Other Ambulatory Visit: Payer: Self-pay

## 2019-07-01 ENCOUNTER — Ambulatory Visit (HOSPITAL_COMMUNITY)
Admission: RE | Admit: 2019-07-01 | Discharge: 2019-07-01 | Disposition: A | Discharge status: Home / Self Care | Payer: Medicare HMO | Source: Ambulatory Visit | Attending: Nurse Practitioner | Admitting: Nurse Practitioner

## 2019-07-01 ENCOUNTER — Encounter (HOSPITAL_COMMUNITY): Payer: Self-pay | Admitting: Physician Assistant

## 2019-07-01 VITALS — BP 132/88 | HR 66 | Ht 63.0 in | Wt 213.6 lb

## 2019-07-01 DIAGNOSIS — Z79899 Other long term (current) drug therapy: Secondary | ICD-10-CM | POA: Diagnosis not present

## 2019-07-01 DIAGNOSIS — G4733 Obstructive sleep apnea (adult) (pediatric): Secondary | ICD-10-CM | POA: Diagnosis not present

## 2019-07-01 DIAGNOSIS — E785 Hyperlipidemia, unspecified: Secondary | ICD-10-CM | POA: Diagnosis not present

## 2019-07-01 DIAGNOSIS — E119 Type 2 diabetes mellitus without complications: Secondary | ICD-10-CM | POA: Diagnosis not present

## 2019-07-01 DIAGNOSIS — D333 Benign neoplasm of cranial nerves: Secondary | ICD-10-CM | POA: Diagnosis not present

## 2019-07-01 DIAGNOSIS — Z8249 Family history of ischemic heart disease and other diseases of the circulatory system: Secondary | ICD-10-CM | POA: Insufficient documentation

## 2019-07-01 DIAGNOSIS — M26602 Left temporomandibular joint disorder, unspecified: Secondary | ICD-10-CM | POA: Insufficient documentation

## 2019-07-01 DIAGNOSIS — I4892 Unspecified atrial flutter: Secondary | ICD-10-CM | POA: Diagnosis not present

## 2019-07-01 DIAGNOSIS — E669 Obesity, unspecified: Secondary | ICD-10-CM | POA: Diagnosis not present

## 2019-07-01 DIAGNOSIS — Z881 Allergy status to other antibiotic agents status: Secondary | ICD-10-CM | POA: Diagnosis not present

## 2019-07-01 DIAGNOSIS — I483 Typical atrial flutter: Secondary | ICD-10-CM | POA: Diagnosis not present

## 2019-07-01 DIAGNOSIS — Z6837 Body mass index (BMI) 37.0-37.9, adult: Secondary | ICD-10-CM | POA: Diagnosis not present

## 2019-07-01 DIAGNOSIS — Z7984 Long term (current) use of oral hypoglycemic drugs: Secondary | ICD-10-CM | POA: Diagnosis not present

## 2019-07-01 DIAGNOSIS — Z7901 Long term (current) use of anticoagulants: Secondary | ICD-10-CM | POA: Insufficient documentation

## 2019-07-01 DIAGNOSIS — I480 Paroxysmal atrial fibrillation: Secondary | ICD-10-CM | POA: Insufficient documentation

## 2019-07-01 DIAGNOSIS — I10 Essential (primary) hypertension: Secondary | ICD-10-CM | POA: Insufficient documentation

## 2019-07-01 NOTE — Progress Notes (Signed)
Primary Care Physician: [name], MD Primary Cardiologist: Dr Marlou Porch Primary Electrophysiologist: Dr Curt Bears Referring Physician: Dr Burlene Arnt is a 73 y.o. female with a history of hypertension, hyperlipidemia, paroxysmal atrial fibrillation, diabetes, and OSA on CPAP who presents for follow up in the [facility]. Patient recently underwent afib ablation with Dr Curt Bears on 05/29/19. She reports that she felt "great" for over two weeks. Unfortunately, she had episodes of heart racing and feeling "jittery inside." Her heart rates at home have been 100-120s. There were no triggering events that the patient could identify. She denies any significant alcohol use. She is compliant with her CPAP therapy.  Patient is s/p DCCV 06/24/19. She initially felt well but then had another episode of rapid heart rate in the 120's the next morning but this has slowed to a normal rate by the afternoon. Her flecainide was increased to 75 mg BID. Since then, her FitBit has shown HR in the normal range. She is still walking daily for increasing amounts of time. She is in SR today.   Today, she denies symptoms of chest pain, shortness of breath, orthopnea, PND, lower extremity edema, dizziness, presyncope, syncope, snoring, daytime somnolence, bleeding, or neurologic sequela. The patient is tolerating medications without difficulties and is otherwise without complaint today.    Atrial Fibrillation Risk Factors:  she does have symptoms or diagnosis of sleep apnea. she is compliant with CPAP therapy. she does not have a history of rheumatic fever. she does not have a history of alcohol use. The patient does have a history of early familial atrial fibrillation or other arrhythmias. Father had PPM.  she has a BMI of Body mass index is 37.84 kg/m.Marland Kitchen Filed Weights   07/01/19 0937  Weight: 96.9 kg    Family History  Problem Relation Age of Onset  . Heart failure  Father   . Heart failure Mother      Atrial Fibrillation Management history:  Previous antiarrhythmic drugs: flecainide Previous cardioversions: 06/24/19 Previous ablations: 05/29/19 CHADS2VASC score: 4 Anticoagulation history: Xarelto   Past Medical History:  Diagnosis Date  . Acoustic neuroma ([place]) followed by dr brown at [facility]   right side w/ chronic roaring noise  . Anticoagulant long-term use    xarelto  . Benign paroxysmal positional vertigo   . Endometrial polyp   . Frequency of urination   . Hyperlipidemia   . Hypertension   . OA (osteoarthritis)    left knee  . OSA on CPAP   . PAF (paroxysmal atrial fibrillation) [facility]) cardiologist-  dr Marlou Porch   dx 04/ 2017  . PMB (postmenopausal bleeding)   . TMJ (temporomandibular joint disorder)    left side-- wears guard  . Type 2 diabetes mellitus ([place])   . Wears contact lenses    Past Surgical History:  Procedure Laterality Date  . ATRIAL FIBRILLATION ABLATION  05/29/2019  . ATRIAL FIBRILLATION ABLATION N/A 05/29/2019   Procedure: ATRIAL FIBRILLATION ABLATION;  Surgeon: [name], MD;  Location: [place] [facility];  Service: Cardiovascular;  Laterality: N/A;  . CARDIOVERSION N/A 06/24/2019   Procedure: CARDIOVERSION;  Surgeon: Skeet Latch, MD;  Location: [place];  Service: Cardiovascular;  Laterality: N/A;  . CARPAL TUNNEL RELEASE Right 04/17/2007   w/ Excision ganglion cyst and Pulley Release right thumb  . DILATATION & CURETTAGE/HYSTEROSCOPY WITH MYOSURE N/A 01/12/2017   Procedure: DILATATION & CURETTAGE/HYSTEROSCOPY WITH MYOSURE;  Surgeon: Arvella Nigh, MD;  Location: [place];  Service: Gynecology;  Laterality: N/A;  . ESOPHAGOGASTRODUODENOSCOPY (EGD) WITH PROPOFOL N/A 09/16/2018   Procedure: ESOPHAGOGASTRODUODENOSCOPY (EGD) WITH PROPOFOL;  Surgeon: Ronald Lobo, MD;  Location: [place];  Service: Endoscopy;  Laterality: N/A;  . GIVENS CAPSULE STUDY N/A 09/16/2018   Procedure:  GIVENS CAPSULE STUDY;  Surgeon: Ronald Lobo, MD;  Location: [place];  Service: Endoscopy;  Laterality: N/A;  . HAMMER TOE SURGERY Left 2010   left second toe  . KNEE ARTHROSCOPY Right 2010  . KNEE SURGERY Left 1993  . LYMPH NODE BIOPSY N/A 03/01/2017   Procedure: SENTINEL LYMPH NODE BIOPSY;  Surgeon: Everitt Amber, MD;  Location: [facility];  Service: Gynecology;  Laterality: N/A;  . NASAL SINUS SURGERY  1985 and 1995  . ROBOTIC ASSISTED TOTAL HYSTERECTOMY WITH BILATERAL SALPINGO OOPHERECTOMY Bilateral 03/01/2017   Procedure: XI ROBOTIC ASSISTED TOTAL HYSTERECTOMY WITH BILATERAL SALPINGO OOPHORECTOMY;  Surgeon: Everitt Amber, MD;  Location: [facility];  Service: Gynecology;  Laterality: Bilateral;  . ROTATOR CUFF REPAIR Right 04/2004  . TOE SURGERY Right 2012   Great toe and second toe  . TOTAL ELBOW REPLACEMENT Right 2000  . TOTAL KNEE ARTHROPLASTY Right 01/12/2010  . TRANSTHORACIC ECHOCARDIOGRAM  03/28/2016   ef 60-65%/ mild AR and MR  . TUBAL LIGATION Bilateral 1978    Current Outpatient Medications  Medication Sig Dispense Refill  . acetaminophen (TYLENOL) 500 MG tablet Take 500-1,000 mg by mouth every 8 (eight) hours as needed for moderate pain or headache.     Marland Kitchen atorvastatin (LIPITOR) 40 MG tablet Take 40 mg by mouth every morning.     . betamethasone valerate (VALISONE) 0.1 % cream Apply 1 application topically 2 (two) times a week.     . cetirizine (ZYRTEC) 10 MG tablet Take 10 mg by mouth daily as needed for allergies.    . Cholecalciferol (VITAMIN D3) 5000 units TABS Take 5,000 Units by mouth daily.     Marland Kitchen docusate sodium (COLACE) 100 MG capsule Take 100 mg by mouth daily.    . flecainide (TAMBOCOR) 50 MG tablet Take 1.5 tablets (75 mg total) by mouth 2 (two) times daily. 180 tablet 1  . ipratropium (ATROVENT) 0.03 % nasal spray Place 2 sprays into both nostrils every 12 (twelve) hours as needed for rhinitis.    Marland Kitchen JANUVIA 100 MG tablet Take 100 mg by mouth daily after supper.     .  Lancets (ONETOUCH DELICA PLUS [id number]) [place]     . lisinopril-hydrochlorothiazide (PRINZIDE,ZESTORETIC) 20-12.5 MG per tablet Take 1 tablet by mouth every morning.     . metoprolol succinate (TOPROL-XL) 50 MG 24 hr tablet Take 50 mg by mouth 2 (two) times daily.     . Multiple Vitamin (MULTIVITAMIN) tablet Take 1 tablet by mouth daily.    . ONE TOUCH ULTRA TEST test strip     . Polyethyl Glycol-Propyl Glycol (SYSTANE OP) Place 1 drop into both eyes daily as needed (dry eyes).    . rivaroxaban (XARELTO) 20 MG TABS tablet TAKE ONE TABLET BY MOUTH ONE TIME DAILY WITH SUPPER (Patient taking differently: Take 20 mg by mouth daily with supper. ) 90 tablet 1  . saline (AYR) GEL Place 1 application into the nose at bedtime.     No current facility-administered medications for this encounter.     Allergies  Allergen Reactions  . Cephalosporins Itching    severe  . Augmentin [Amoxicillin-Pot Clavulanate] Itching    severe    Social History   Socioeconomic History  . Marital  status: Married    Spouse name: Not on file  . Number of children: Not on file  . Years of education: Not on file  . Highest education level: Not on file  Occupational History  . Not on file  Social Needs  . Financial resource strain: Not on file  . Food insecurity    Worry: Not on file    Inability: Not on file  . Transportation needs    Medical: Not on file    Non-medical: Not on file  Tobacco Use  . Smoking status: Never Smoker  . Smokeless tobacco: Never Used  Substance and Sexual Activity  . Alcohol use: No  . Drug use: No  . Sexual activity: Not on file  Lifestyle  . Physical activity    Days per week: Not on file    Minutes per session: Not on file  . Stress: Not on file  Relationships  . Social Herbalist on phone: Not on file    Gets together: Not on file    Attends religious service: Not on file    Active member of club or organization: Not on file    Attends meetings of clubs or  organizations: Not on file    Relationship status: Not on file  . Intimate partner violence    Fear of current or ex partner: Not on file    Emotionally abused: Not on file    Physically abused: Not on file    Forced sexual activity: Not on file  Other Topics Concern  . Not on file  Social History Narrative   Married w/2 children (boy and girl).  Retired.     ROS- All systems are reviewed and negative except as per the HPI above.  Physical Exam: Vitals:   07/01/19 0937  BP: 132/88  Pulse: 66  Weight: 96.9 kg  Height: 5\' 3"  (1.6 m)    GEN- The patient is well appearing obese female, alert and oriented x 3 today.   HEENT-head normocephalic, atraumatic, sclera clear, conjunctiva pink, hearing intact, trachea midline. Lungs- Clear to ausculation bilaterally, normal work of breathing Heart- Regular rate and rhythm, no murmurs, rubs or gallops  GI- soft, NT, ND, + BS Extremities- no clubbing, cyanosis, or edema MS- no significant deformity or atrophy Skin- no rash or lesion Psych- euthymic mood, full affect Neuro- strength and sensation are intact   Wt Readings from Last 3 Encounters:  07/01/19 96.9 kg  06/24/19 96.2 kg  06/20/19 96.2 kg    EKG today demonstrates SR HR 66, PAC, LAD, PR 202, QRS 94, QTc 394  Echo 03/28/16 demonstrated  - Left ventricle: The cavity size was normal. Systolic function was   normal. The estimated ejection fraction was in the range of 60%   to 65%. Wall motion was normal; there were no regional wall   motion abnormalities. The transmitral flow pattern was normal.   Left ventricular diastolic function parameters were normal. - Aortic valve: Moderate focal calcification, consistent with   sclerosis. There was mild regurgitation. - Mitral valve: There was mild regurgitation.   Epic records are reviewed at length today  Assessment and Plan:  1. Paroxysmal atrial fibrillation/atrial flutter S/p ablation with Dr Curt Bears 05/29/19. S/p DCCV  06/24/19. Continue flecainide 75 mg BID. Continue Xarelto 20 mg daily. We also discussed possibility of atrial flutter ablation. Patient would like to continue current treatment for now. She is willing to consider ablation if she has more atrial flutter episodes.  This patients CHA2DS2-VASc Score and unadjusted Ischemic Stroke Rate (% per year) is equal to 4.8 % stroke rate/year from a score of 4  Above score calculated as 1 point each if present [CHF, HTN, DM, Vascular=MI/PAD/Aortic Plaque, Age if 65-74, or Female] Above score calculated as 2 points each if present [Age > 75, or Stroke/TIA/TE]   2. Obesity Body mass index is 37.84 kg/m. Lifestyle modification was discussed and encouraged including regular physical activity and weight reduction. Patient walking daily.  3. Obstructive sleep apnea The importance of adequate treatment of sleep apnea was discussed today in order to improve our ability to maintain sinus rhythm long term. Compliant with CPAP therapy.  4. HTN Stable, no changes today.   Follow up with Dr Curt Bears as scheduled.    [facility] [address] [place], [place] [address] [phone] 07/02/2019 11:46 AM

## 2019-07-05 DIAGNOSIS — Z23 Encounter for immunization: Secondary | ICD-10-CM | POA: Diagnosis not present

## 2019-07-09 DIAGNOSIS — Z6839 Body mass index (BMI) 39.0-39.9, adult: Secondary | ICD-10-CM | POA: Diagnosis not present

## 2019-07-09 DIAGNOSIS — E785 Hyperlipidemia, unspecified: Secondary | ICD-10-CM | POA: Insufficient documentation

## 2019-07-09 DIAGNOSIS — Z01419 Encounter for gynecological examination (general) (routine) without abnormal findings: Secondary | ICD-10-CM | POA: Diagnosis not present

## 2019-07-15 DIAGNOSIS — R69 Illness, unspecified: Secondary | ICD-10-CM | POA: Diagnosis not present

## 2019-07-21 ENCOUNTER — Telehealth (HOSPITAL_COMMUNITY): Payer: Self-pay | Admitting: *Deleted

## 2019-07-21 NOTE — Telephone Encounter (Signed)
Patient called in stating she is noticing HRs in the upper 30s and 40s - she is not symptomatic but it worries her. Discussed with Adline Peals PA will decrease metoprolol to 50mg  once a day - she will follow her BP/HR and report in 1 week. Pt in agreement

## 2019-07-29 NOTE — Telephone Encounter (Signed)
Patient called back stating she is feeling better on decreased metoprolol HR in the 50s. She has noticed some blood on her toilet paper with BMs denies hemorrhoids that she is aware of. Denies dark stools. Instructed pt to contact PCP to have stool checked for occult blood and exam for hemorrhoids. Pt verbalized understanding.

## 2019-08-09 ENCOUNTER — Other Ambulatory Visit: Payer: Self-pay | Admitting: Cardiology

## 2019-08-18 ENCOUNTER — Telehealth (HOSPITAL_COMMUNITY): Payer: Self-pay | Admitting: *Deleted

## 2019-08-18 MED ORDER — FLECAINIDE ACETATE 50 MG PO TABS: 75.0000 mg | ORAL_TABLET | Freq: Two times a day (BID) | ORAL | 2 refills | Status: DC

## 2019-08-18 NOTE — Telephone Encounter (Signed)
Patient called in stating over weekend she was out of rhythm on Friday for about 6 hours. Good on Saturday. On Sunday returned to aflutter and has been in it ever since. Patient states her HR has been from 89-120s. Not sleeping well. She did increase her metoprolol back to 50mg  BID. Continues on Flecainide 75mg  BID. Pt states at last visit there was mention of Dr. Curt Bears taking for aflutter ablation. Pt is interested in ablation at this time. I will forward message to Dr. Delos Haring RN to discuss/arrange if deemed appropriate.

## 2019-08-19 DIAGNOSIS — H35041 Retinal micro-aneurysms, unspecified, right eye: Secondary | ICD-10-CM | POA: Diagnosis not present

## 2019-08-19 DIAGNOSIS — H43812 Vitreous degeneration, left eye: Secondary | ICD-10-CM | POA: Diagnosis not present

## 2019-08-19 DIAGNOSIS — E113293 Type 2 diabetes mellitus with mild nonproliferative diabetic retinopathy without macular edema, bilateral: Secondary | ICD-10-CM | POA: Diagnosis not present

## 2019-08-19 DIAGNOSIS — H25813 Combined forms of age-related cataract, bilateral: Secondary | ICD-10-CM | POA: Diagnosis not present

## 2019-08-20 NOTE — Telephone Encounter (Signed)
Pt aware we will discuss further at scheduled OV week after next.  Patient verbalized understanding and agreeable to plan.

## 2019-08-21 DIAGNOSIS — I10 Essential (primary) hypertension: Secondary | ICD-10-CM | POA: Diagnosis not present

## 2019-08-21 DIAGNOSIS — E119 Type 2 diabetes mellitus without complications: Secondary | ICD-10-CM | POA: Diagnosis not present

## 2019-08-28 DIAGNOSIS — Z7901 Long term (current) use of anticoagulants: Secondary | ICD-10-CM | POA: Diagnosis not present

## 2019-08-28 DIAGNOSIS — I10 Essential (primary) hypertension: Secondary | ICD-10-CM | POA: Diagnosis not present

## 2019-08-28 DIAGNOSIS — R82998 Other abnormal findings in urine: Secondary | ICD-10-CM | POA: Diagnosis not present

## 2019-08-28 DIAGNOSIS — Z0000 Encounter for general adult medical examination without abnormal findings: Secondary | ICD-10-CM | POA: Diagnosis not present

## 2019-08-28 DIAGNOSIS — M25562 Pain in left knee: Secondary | ICD-10-CM | POA: Diagnosis not present

## 2019-08-28 DIAGNOSIS — I739 Peripheral vascular disease, unspecified: Secondary | ICD-10-CM | POA: Diagnosis not present

## 2019-08-28 DIAGNOSIS — E1151 Type 2 diabetes mellitus with diabetic peripheral angiopathy without gangrene: Secondary | ICD-10-CM | POA: Diagnosis not present

## 2019-08-28 DIAGNOSIS — I480 Paroxysmal atrial fibrillation: Secondary | ICD-10-CM | POA: Diagnosis not present

## 2019-08-28 DIAGNOSIS — G4733 Obstructive sleep apnea (adult) (pediatric): Secondary | ICD-10-CM | POA: Diagnosis not present

## 2019-08-28 DIAGNOSIS — E785 Hyperlipidemia, unspecified: Secondary | ICD-10-CM | POA: Diagnosis not present

## 2019-09-01 DIAGNOSIS — Z1231 Encounter for screening mammogram for malignant neoplasm of breast: Secondary | ICD-10-CM | POA: Diagnosis not present

## 2019-09-01 DIAGNOSIS — K921 Melena: Secondary | ICD-10-CM | POA: Diagnosis not present

## 2019-09-02 ENCOUNTER — Other Ambulatory Visit: Payer: Self-pay

## 2019-09-02 ENCOUNTER — Ambulatory Visit: Payer: Medicare HMO | Admitting: Neurology

## 2019-09-02 ENCOUNTER — Encounter: Payer: Self-pay | Admitting: Neurology

## 2019-09-02 VITALS — BP 163/69 | HR 66 | Temp 97.7°F | Ht 63.0 in | Wt 215.3 lb

## 2019-09-02 DIAGNOSIS — Z9989 Dependence on other enabling machines and devices: Secondary | ICD-10-CM

## 2019-09-02 DIAGNOSIS — G4733 Obstructive sleep apnea (adult) (pediatric): Secondary | ICD-10-CM | POA: Diagnosis not present

## 2019-09-02 NOTE — Progress Notes (Signed)
Order for cpap supplies sent to [facility] via fax. Confirmation received that the order transmitted was successful.  

## 2019-09-02 NOTE — Progress Notes (Signed)
Subjective:    Patient ID: Erika Kim is a 73 y.o. female.  HPI     Interim history:   Erika Kim is a very pleasant 73 year old right-handed woman with an underlying medical history of hyperlipidemia, acoustic neuroma, hypertension, DM, recurrent headaches, positional vertigo, left shoulder pain, and sinus congestion, status post sinus surgery, left knee surgery, tubal ligation, right rotator cuff surgery, right total knee replacement surgery, right great toe surgery, status post hysterectomy in 2018, paroxysmal A. fib with status post cardioversion in September 2020, who presents for follow-up consultation of her obstructive sleep apnea, on CPAP therapy.  The patient is unaccompanied today.  I last saw her on 08/27/2017, at which time she was doing well with CPAP and fully compliant.   She saw Vaughan Browner, in the interim, on 08/27/2018, at which time she was fully compliant with CPAP and advised to follow-up in 1 year.   Today, 09/02/2019: I reviewed her CPAP compliance data from 08/02/2019 through 08/31/2019 which is a total of 30 days, during which time she used her machine every night with percent use days greater than 4 hours at 100%, indicating superb compliance with an average usage of 7 hours and 47 minutes, residual AHI at goal at 0.8/h, leak is low with a 95th percentile at 4.7 L/min on a pressure of 8 cm with EPR of 2.  She reports Doing well with her CPAP, she is typically up-to-date with her supplies, she is not keen on getting a new machine as it is working well.  She uses nasal pillows. She had a cardiac ablation in August 2020 and a subsequent cardioversion in September 2020.  She has a follow-up appointment with Dr. Curt Bears tomorrow.   The patient's allergies, current medications, family history, past medical history, past social history, past surgical history and problem list were reviewed and updated as appropriate.      Previously:    I saw her on 08/22/2016, at which  time she reported doing well. She was fully compliant with CPAP. She had no recent episode of headache or vertigo. She had a bout of A. fib some 6 months prior. She went to the ER and was diagnosed with paroxysmal A. fib, started on Xarelto and had a follow-up with cardiology.    I reviewed her CPAP compliance data from 07/28/2017 through 08/26/2017 which is a total of 30 days, during which time she used her machine every night with percent used days greater than 4 hours at 100%, indicating superb compliance with an average usage of 7 hours and 39 minutes, residual AHI at goal at 1.2 per hour, leak on the low side with the 95th percentile at 3.1 L/m on a pressure of 8 cm with EPR of 2.   I saw her on 09/02/2015 for vertigo. She had seen Dr. Ernesto Rutherford on 07/20/2015 and he voiced concern that she may have vertiginous migraines. She reported 3 episodes of vertigo in 2016. She had some headaches which are infrequent, some associated with nausea but no significant photophobia and she did report that it would help to lie down and rest and she will take over-the-counter Advil. She felt that her headaches actually improved after she started CPAP therapy and she was fully compliant with CPAP. Her exam is nonfocal at the time and she followed regularly with ENT. I suggested she continue with as needed Aleve for headaches and talked to her ENT about potentially pursuing vestibular rehabilitation for vertigo. She was fully compliant with CPAP  therapy. I suggested a one-year checkup.   I reviewed her CPAP compliance data from 07/22/2016 through 08/20/2016 which is a total of 30 days, during which time she used her machine every night with percent used days greater than 4 hours at 100%, indicating superb compliance with an average usage of 7 hours and 36 minutes, residual AHI 1.3 per hour, leaked low with the 95th percentile at 6.1 L/m on a pressure of 8 cm with EPR of 2.    I saw her on 10/08/2014, at which time she was  doing well on CPAP therapy with full compliance and great results. She had a recent brain MRI for her acoustic neuroma at the time and her MRI was felt to be stable per her report.   I reviewed her CPAP compliance data from 07/17/2015 through 08/15/2015 which is a total of 30 days during which time she used her machine every day with percent used days greater than 100%, indicating superb compliance with an average usage of 7 hours and 27 minutes, residual AHI low at 1.3 per hour, leak low with the 95th percentile at 4 L/m on a pressure of 8 cm with EPR of 2.   I saw her on 04/08/2014, at which time she reported adjusting well to CPAP and endorse sleeping deeply and without is many interruptions. She felt memory was stable. She had started her diabetes medication.   I reviewed her compliance data from 09/05/2014 to 10/04/2014 which is a total of 30 days during which time she used her machine every night with percent used days greater than 4 hours of 100%, indicating superb compliance. Average usage of 7 hours and 9 minutes, residual AHI low at 1.8 per hour and leak low at 6 L/m for the 95th percentile with the pressure currently at 8 cm with EPR of 2.   I saw her on 10/09/2013, at which time we discussed her recent baseline sleep study which confirmed mild to moderate sleep apnea. I felt that because of her underlying sleep related complaints and her medical history she would benefit from treatment with CPAP and suggested a repeat sleep study with CPAP titration. She had the study on 01/26/2014 and I went over her test results with her in detail today. Sleep efficiency was markedly reduced at 47.1% with a long latency to sleep of 48.5 minutes and wake after sleep onset of 165 minutes with moderate sleep fragmentation noted. She had an increased percentage of slow-wave sleep and a normal percentage of REM sleep with a prolonged REM latency. She had no significant periodic leg movements of sleep and no  significant EKG changes. She had a baseline oxygen saturation of 93% with a nadir of 84%. Snoring was eliminated with CPAP which was started at 5 cm and titrated to 8 cm with elimination of her sleep disordered breathing and her snoring. REM sleep was achieved supine REM sleep was not achieved. Based on her test results I prescribed CPAP for her.   I reviewed the patient's CPAP compliance data from 03/08/2014 to 04/06/2014, which is a total of 30 days, during which time the patient used CPAP every day. The average usage for all days was 7 hours and 21 minutes. The percent used days greater than 4 hours was 100 %, indicating superb compliance. The residual AHI was low at 1.5 per hour, indicating an appropriate treatment pressure of 8 cwp with EPR of 2. Air leak from the mask was low with the 95th percentile at 5.4  L per minute.   I first met her on 08/08/2013 at which time she complained of chronic poor quality sleep, snoring, and recent onset of memory problems, as well as witnessed apneas.    She had a baseline sleep study on 09/04/2013. Her sleep efficiency was reduced at 66.1% with a prolonged latency to sleep of 34.5 minutes. Wake after sleep onset was 91 minutes with moderate sleep fragmentation noted. She had an increased percentage of stage I and stage II sleep, and near normal percentage of deep sleep and a decreased percentage of REM sleep at 13.3% with a high normal REM latency of 117 minutes. She had no significant periodic leg movements of sleep. She had moderate to at times loud snoring. She had a total AHI of 6.4 per hour with further elevation to 20.9 per hour in the supine position. Her baseline oxygen saturation was 93%, her nadir was 88%. She spent 10 minutes and 14 seconds below the saturation of 90% for the night.   We called her with the test results in the interim and asked her to consider returning for CPAP titration study and she reported that she would like to discuss this with her  husband first, which is why I saw her back in follow up.  Her Past Medical History Is Significant For: Past Medical History:  Diagnosis Date  . Acoustic neuroma ([place]) followed by dr brown at [facility]   right side w/ chronic roaring noise  . Anticoagulant long-term use    xarelto  . Benign paroxysmal positional vertigo   . Endometrial polyp   . Frequency of urination   . Hyperlipidemia   . Hypertension   . OA (osteoarthritis)    left knee  . OSA on CPAP   . PAF (paroxysmal atrial fibrillation) [facility]) cardiologist-  dr Marlou Porch   dx 04/ 2017  . PMB (postmenopausal bleeding)   . TMJ (temporomandibular joint disorder)    left side-- wears guard  . Type 2 diabetes mellitus ([place])   . Wears contact lenses     Her Past Surgical History Is Significant For: Past Surgical History:  Procedure Laterality Date  . ATRIAL FIBRILLATION ABLATION  05/29/2019  . ATRIAL FIBRILLATION ABLATION N/A 05/29/2019   Procedure: ATRIAL FIBRILLATION ABLATION;  Surgeon: [name], MD;  Location: [facility];  Service: Cardiovascular;  Laterality: N/A;  . CARDIOVERSION N/A 06/24/2019   Procedure: CARDIOVERSION;  Surgeon: Skeet Latch, MD;  Location: [place];  Service: Cardiovascular;  Laterality: N/A;  . CARPAL TUNNEL RELEASE Right 04/17/2007   w/ Excision ganglion cyst and Pulley Release right thumb  . DILATATION & CURETTAGE/HYSTEROSCOPY WITH MYOSURE N/A 01/12/2017   Procedure: DILATATION & CURETTAGE/HYSTEROSCOPY WITH MYOSURE;  Surgeon: Arvella Nigh, MD;  Location: [place];  Service: Gynecology;  Laterality: N/A;  . ESOPHAGOGASTRODUODENOSCOPY (EGD) WITH PROPOFOL N/A 09/16/2018   Procedure: ESOPHAGOGASTRODUODENOSCOPY (EGD) WITH PROPOFOL;  Surgeon: Ronald Lobo, MD;  Location: [place];  Service: Endoscopy;  Laterality: N/A;  . GIVENS CAPSULE STUDY N/A 09/16/2018   Procedure: GIVENS CAPSULE STUDY;  Surgeon: Ronald Lobo, MD;  Location: [place];  Service:  Endoscopy;  Laterality: N/A;  . HAMMER TOE SURGERY Left 2010   left second toe  . KNEE ARTHROSCOPY Right 2010  . KNEE SURGERY Left 1993  . LYMPH NODE BIOPSY N/A 03/01/2017   Procedure: SENTINEL LYMPH NODE BIOPSY;  Surgeon: Everitt Amber, MD;  Location: [facility];  Service: Gynecology;  Laterality: N/A;  . [place]  and 1995  . ROBOTIC ASSISTED TOTAL HYSTERECTOMY WITH BILATERAL SALPINGO OOPHERECTOMY Bilateral 03/01/2017   Procedure: XI ROBOTIC ASSISTED TOTAL HYSTERECTOMY WITH BILATERAL SALPINGO OOPHORECTOMY;  Surgeon: Everitt Amber, MD;  Location: [facility];  Service: Gynecology;  Laterality: Bilateral;  . ROTATOR CUFF REPAIR Right 04/2004  . TOE SURGERY Right 2012   Great toe and second toe  . TOTAL ELBOW REPLACEMENT Right 2000  . TOTAL KNEE ARTHROPLASTY Right 01/12/2010  . TRANSTHORACIC ECHOCARDIOGRAM  03/28/2016   ef 60-65%/ mild AR and MR  . TUBAL LIGATION Bilateral 1978    Her Family History Is Significant For: Family History  Problem Relation Age of Onset  . Heart failure Father   . Heart failure Mother     Her Social History Is Significant For: Social History   Socioeconomic History  . Marital status: Married    Spouse name: Not on file  . Number of children: Not on file  . Years of education: Not on file  . Highest education level: Not on file  Occupational History  . Not on file  Social Needs  . Financial resource strain: Not on file  . Food insecurity    Worry: Not on file    Inability: Not on file  . Transportation needs    Medical: Not on file    Non-medical: Not on file  Tobacco Use  . Smoking status: Never Smoker  . Smokeless tobacco: Never Used  Substance and Sexual Activity  . Alcohol use: No  . Drug use: No  . Sexual activity: Not on file  Lifestyle  . Physical activity    Days per week: Not on file    Minutes per session: Not on file  . Stress: Not on file  Relationships  . Social Herbalist on phone: Not on file    Gets  together: Not on file    Attends religious service: Not on file    Active member of club or organization: Not on file    Attends meetings of clubs or organizations: Not on file    Relationship status: Not on file  Other Topics Concern  . Not on file  Social History Narrative   Married w/2 children (boy and girl).  Retired.    Her Allergies Are:  Allergies  Allergen Reactions  . Cephalosporins Itching    severe  . Augmentin [Amoxicillin-Pot Clavulanate] Itching    severe  :   Her Current Medications Are:  Outpatient Encounter Medications as of 09/02/2019  Medication Sig  . acetaminophen (TYLENOL) 500 MG tablet Take 500-1,000 mg by mouth every 8 (eight) hours as needed for moderate pain or headache.   Marland Kitchen atorvastatin (LIPITOR) 40 MG tablet Take 40 mg by mouth every morning.   . betamethasone valerate (VALISONE) 0.1 % cream Apply 1 application topically 2 (two) times a week.   . Cholecalciferol (VITAMIN D3) 5000 units TABS Take 5,000 Units by mouth daily.   . Cranberry-Vitamin C (CRANBERRY CONCENTRATE/VITAMINC PO) Take by mouth.  . docusate sodium (COLACE) 100 MG capsule Take 100 mg by mouth daily.  . flecainide (TAMBOCOR) 50 MG tablet Take 1.5 tablets (75 mg total) by mouth 2 (two) times daily.  . hydrochlorothiazide (HYDRODIURIL) 12.5 MG tablet hydrochlorothiazide 12.5 mg tablet  . ipratropium (ATROVENT) 0.03 % nasal spray Place 2 sprays into both nostrils every 12 (twelve) hours as needed for rhinitis.  Marland Kitchen JANUVIA 100 MG tablet Take 100 mg by mouth daily after supper.   . Lancets [facility]  DELICA PLUS [id number]) MISC   . metoprolol succinate (TOPROL-XL) 50 MG 24 hr tablet Take 50 mg by mouth daily.  . Multiple Vitamin (MULTIVITAMIN) tablet Take 1 tablet by mouth daily.  Marland Kitchen olmesartan (BENICAR) 20 MG tablet Take 20 mg by mouth daily.  . ONE TOUCH ULTRA TEST test strip   . Polyethyl Glycol-Propyl Glycol (SYSTANE OP) Place 1 drop into both eyes daily as needed (dry eyes).  .  rivaroxaban (XARELTO) 20 MG TABS tablet TAKE ONE TABLET BY MOUTH ONE TIME DAILY WITH SUPPER (Patient taking differently: Take 20 mg by mouth daily with supper. )  . saline (AYR) GEL Place 1 application into the nose at bedtime.  . [DISCONTINUED] cetirizine (ZYRTEC) 10 MG tablet Take 10 mg by mouth daily as needed for allergies.  . [DISCONTINUED] lisinopril-hydrochlorothiazide (PRINZIDE,ZESTORETIC) 20-12.5 MG per tablet Take 1 tablet by mouth every morning.    No facility-administered encounter medications on file as of 09/02/2019.   :  Review of Systems:  Out of a complete 14 point review of systems, all are reviewed and negative with the exception of these symptoms as listed below: Review of Systems  Neurological:       Here for 1 year f/u on CPAP. Reports she sleeps 6-7 hours a night. Reports machine is working well no concerns.    Objective:  Neurological Exam  Physical Exam Physical Examination:   Vitals:   09/02/19 1302  BP: (!) 163/69  Pulse: 66  Temp: 97.7 F (36.5 C)    General Examination: The patient is a very pleasant 73 y.o. female in no acute distress. She appears well-developed and well-nourished and well groomed.   HEENT:Normocephalic, atraumatic, pupils are equal, round and reactive to light, extraocular tracking is preserved. Hearing is grossly intact. Face is symmetric with normal facial animation and normal facial sensation. Speech is clear with no dysarthria noted. There is no hypophonia. Her speech is slightly nasal sounding. There is no pharyngeal erythema. There is no lip, neck/head, jaw or voice tremor. Neck is supple with full range of passive and active motion. There are no carotid bruits on auscultation. Oropharynx exam reveals: mild mouth dryness, adequate dental hygiene and mild airway crowding. Mallampati is class II. Tongue protrudes centrally and palate elevates symmetrically. Tonsils are absent.   Chest:Clear to auscultation without wheezing,  rhonchi or crackles noted.  Heart:S1+S2+0, regular and normal without murmurs, rubs or gallops noted.   Abdomen:Soft, non-tender and non-distended with normal bowel sounds appreciated on auscultation. Abdomen in obese.  Extremities:There is no pitting edema in the distal lower extremities bilaterally.   Skin: Warm and dry without trophic changes noted.   Musculoskeletal: exam reveals no obvious joint deformities, tenderness or joint swelling or erythema, but reports L knee pain.   Neurologically:  Mental status: The patient is awake, alert and oriented in all 4 spheres. Her memory, attention, language and knowledge are appropriate. There is no aphasia, agnosia, apraxia or anomia. Speech is clear with normal prosody and enunciation. Thought process is linear. Mood is congruent and affect is normal.  Cranial nerves are as described above under HEENT exam. Motor exam: Normal bulk, strength and tone is noted. There is no tremor. Fine motor skills are grossly intact.  Cerebellar testing shows no dysmetria or intention tremor.  Sensory exam is intact to light touch in the upper and lower extremities.  Gait, station and balance:She stands without problems, denies any lightheadedness or vertiginous symptoms. Posture is age-appropriate and gait is unremarkable, no obvious limp.  Assessment and Plan:   In summary, Erika Kim is a very pleasant 73 year old female with an underlying medical history of hyperlipidemia, PAF (on Xarelto), with s/p Cardiac ablation and status post cardioversion, acoustic neuroma (followed by Dr. Owens Shark at [facility]), hypertension, DM, recurrent headaches, positional vertigo, left shoulder pain, and sinus congestion, statusmultiple surgeries including sinus surgery, left knee surgery,tubal ligation, right rotator cuff surgery, right total knee replacement surgery, right great toe surgery,and obesity, who presents for follow-up consultation of her  obstructive sleep apnea, on CPAP therapy with full compliance and ongoing good results. She has been tolerating the nasal pillows, and the current settings are adequate. She is encouraged to continue with treatment. Physical exam and neurological exam are stable and nonfocal. I updated her prescription for CPAP related supplies. Isuggested follow up in oneyear. I answered all her questions today and she was in agreement.

## 2019-09-02 NOTE — Patient Instructions (Addendum)
Please continue using your CPAP regularly. While your insurance requires that you use CPAP at least 4 hours each night on 70% of the nights, I recommend, that you not skip any nights and use it throughout the night if you can. Getting used to CPAP and staying with the treatment long term does take time and patience and discipline. Untreated obstructive sleep apnea when it is moderate to severe can have an adverse impact on cardiovascular health and raise her risk for heart disease, arrhythmias, hypertension, congestive heart failure, stroke and diabetes. Untreated obstructive sleep apnea causes sleep disruption, nonrestorative sleep, and sleep deprivation. This can have an impact on your day to day functioning and cause daytime sleepiness and impairment of cognitive function, memory loss, mood disturbance, and problems focussing. Using CPAP regularly can improve these symptoms.  Keep up the good work! We can see you in one year.

## 2019-09-04 ENCOUNTER — Encounter: Payer: Self-pay | Admitting: Cardiology

## 2019-09-04 ENCOUNTER — Ambulatory Visit: Payer: Medicare HMO | Admitting: Cardiology

## 2019-09-04 ENCOUNTER — Other Ambulatory Visit: Payer: Self-pay

## 2019-09-04 VITALS — BP 126/58 | HR 76 | Ht 63.0 in | Wt 212.2 lb

## 2019-09-04 DIAGNOSIS — I480 Paroxysmal atrial fibrillation: Secondary | ICD-10-CM

## 2019-09-04 MED ORDER — FLECAINIDE ACETATE 100 MG PO TABS: 100.0000 mg | ORAL_TABLET | Freq: Two times a day (BID) | ORAL | 1 refills | Status: DC

## 2019-09-04 NOTE — Patient Instructions (Addendum)
Medication Instructions:  Your physician has recommended you make the following change in your medication:  1. INCREASE Flecainide to 100 mg twice daily  Labwork: Return for pre procedure lab work between: 09/29/19 - 10/18/19  for BMET & CBC If you have labs (blood work) drawn today and your tests are completely normal, you will receive your results only by:  [place] (if you have MyChart) OR  A paper copy in the mail If you have any lab test that is abnormal or we need to change your treatment, we will call you to review the results.  Testing/Procedures: Your physician has requested that you have cardiac CT within 7 days prior to procedure. Cardiac computed tomography (CT) is a painless test that uses an x-ray machine to take clear, detailed pictures of your heart. For further information please visit [email or web address]. Please follow instruction sheet as given.  Your physician has recommended that you have an ablation. Catheter ablation is a medical procedure used to treat some cardiac arrhythmias (irregular heartbeats). During catheter ablation, a long, thin, flexible tube is put into a blood vessel in your groin (upper thigh), or neck. This tube is called an ablation catheter. It is then guided to your heart through the blood vessel. Radio frequency waves destroy small areas of heart tissue where abnormal heartbeats may cause an arrhythmia to start.  Follow-Up: Your physician recommends that you schedule a follow-up appointment in: 4 weeks, after your procedure on ________, with the AFib clinic.  Your physician recommends that you schedule a follow-up appointment in: 3 months, after your procedure on _________, with Dr. Curt Bears.  * If you need a refill on your cardiac medications before your next appointment, please call your pharmacy.   Thank you for choosing [facility]!!   Erika Curet, RN [phone]  Any Other Special Instructions Will Be Listed Below (If  Applicable).   Electrophysiology/Ablation Procedure Instructions   You are scheduled for a(n) repeat ablation on 10/29/2019 with Dr. Allegra Lai.   1.   Pre procedure testing-             A.  LAB WORK --- On __________ you will first go to the [facility] office (see address at the top of this letter) at _________ for your pre procedure blood work.                 B. COVID TEST-- On 10/25/2019 @ 9:15 am - You will go to [facility] hospital ([place]) for your Covid testing.   This is a drive thru test site.  There will be multiple testing areas.  Be sure to share with the first checkpoint that you are there for pre-procedure/surgery testing. This will put you into the right (yellow) lane that leads to the PAT testing team. Stay in your car and the nurse team will come to your car to test you.  After you are tested please go home and self quarantine until the day of your procedure.     2. On the day of your procedure 10/29/2019 you will go to [facility] [phone]) at 9:30 am.  Dennis Bast will go to the main entrance [address] [employer]) and enter where the [employer] are.  Your driver will drop you off and you will head down the hallway to ADMITTING.  You may have one support person come in to the hospital with you.  They will be asked to wait in the waiting  room.   3.   Do not eat or drink after midnight prior to your procedure.   4.   Do NOT take any medications the morning of your procedure.   5.  Plan for an overnight stay.  If you use your phone frequently bring your phone charger.   6. You will follow up with the AFIB clinic 4 weeks after your procedure.  You will follow up with Dr. Curt Bears  3 months after your procedure.  These appointments will be made for you.   * If you have ANY questions please call the office [phone] and ask for Erika Yeaman RN or send me a MyChart message   * Occasionally, EP Studies and ablations can become lengthy.  Please make  your family aware of this before your procedure starts.  Average time ranges from 2-8 hours for EP studies/ablations.  Your physician will call your family after the procedure with the results.                                    Cardiac Ablation Cardiac ablation is a procedure to disable (ablate) a small amount of heart tissue in very specific places. The heart has many electrical connections. Sometimes these connections are abnormal and can cause the heart to beat very fast or irregularly. Ablating some of the problem areas can improve the heart rhythm or return it to normal. Ablation may be done for people who:  Have Wolff-Parkinson-White syndrome.  Have fast heart rhythms (tachycardia).  Have taken medicines for an abnormal heart rhythm (arrhythmia) that were not effective or caused side effects.  Have a high-risk heartbeat that may be life-threatening.  During the procedure, a small incision is made in the neck or the groin, and a long, thin, flexible tube (catheter) is inserted into the incision and moved to the heart. Small devices (electrodes) on the tip of the catheter will send out electrical currents. A type of X-ray (fluoroscopy) will be used to help guide the catheter and to provide images of the heart. Tell a health care provider about:  Any allergies you have.  All medicines you are taking, including vitamins, herbs, eye drops, creams, and over-the-counter medicines.  Any problems you or family members have had with anesthetic medicines.  Any blood disorders you have.  Any surgeries you have had.  Any medical conditions you have, such as kidney failure.  Whether you are pregnant or may be pregnant. What are the risks? Generally, this is a safe procedure. However, problems may occur, including:  Infection.  Bruising and bleeding at the catheter insertion site.  Bleeding into the chest, especially into the sac that surrounds the heart. This is a serious complication.   Stroke or blood clots.  Damage to other structures or organs.  Allergic reaction to medicines or dyes.  Need for a permanent pacemaker if the normal electrical system is damaged. A pacemaker is a small computer that sends electrical signals to the heart and helps your heart beat normally.  The procedure not being fully effective. This may not be recognized until months later. Repeat ablation procedures are sometimes required.  What happens before the procedure?  Follow instructions from your health care provider about eating or drinking restrictions.  Ask your health care provider about: ? Changing or stopping your regular medicines. This is especially important if you are taking diabetes medicines or blood thinners. ? Taking medicines  such as aspirin and ibuprofen. These medicines can thin your blood. Do not take these medicines before your procedure if your health care provider instructs you not to.  Plan to have someone take you home from the hospital or clinic.  If you will be going home right after the procedure, plan to have someone with you for 24 hours. What happens during the procedure?  To lower your risk of infection: ? Your health care team will wash or sanitize their hands. ? Your skin will be washed with soap. ? Hair may be removed from the incision area.  An IV tube will be inserted into one of your veins.  You will be given a medicine to help you relax (sedative).  The skin on your neck or groin will be numbed.  An incision will be made in your neck or your groin.  A needle will be inserted through the incision and into a large vein in your neck or groin.  A catheter will be inserted into the needle and moved to your heart.  Dye may be injected through the catheter to help your surgeon see the area of the heart that needs treatment.  Electrical currents will be sent from the catheter to ablate heart tissue in desired areas. There are three types of energy  that may be used to ablate heart tissue: ? Heat (radiofrequency energy). ? Laser energy. ? Extreme cold (cryoablation).  When the necessary tissue has been ablated, the catheter will be removed.  Pressure will be held on the catheter insertion area to prevent excessive bleeding.  A bandage (dressing) will be placed over the catheter insertion area. The procedure may vary among health care providers and hospitals. What happens after the procedure?  Your blood pressure, heart rate, breathing rate, and blood oxygen level will be monitored until the medicines you were given have worn off.  Your catheter insertion area will be monitored for bleeding. You will need to lie still for a few hours to ensure that you do not bleed from the catheter insertion area.  Do not drive for 5-7 days or as long as directed by your health care provider. Summary  Cardiac ablation is a procedure to disable (ablate) a small amount of heart tissue in very specific places. Ablating some of the problem areas can improve the heart rhythm or return it to normal.  During the procedure, electrical currents will be sent from the catheter to ablate heart tissue in desired areas. This information is not intended to replace advice given to you by your health care provider. Make sure you discuss any questions you have with your health care provider. Document Released: 02/25/2009 Document Revised: 08/28/2016 Document Reviewed: 08/28/2016 Elsevier Interactive Patient Education  [employer].

## 2019-09-04 NOTE — Progress Notes (Signed)
Electrophysiology Office Note   Date:  09/04/2019   ID:  Erika Kim, Erika Kim 10/31/1945, MRN [id number]  PCP:  [name], MD  Cardiologist:  Marlou Porch Primary Electrophysiologist:  Emilyann Banka Meredith Leeds, MD    No chief complaint on file.    History of Present Illness: Erika Kim is a 73 y.o. female who is being seen today for the evaluation of atrial fibrillation at the request of [name]. Presenting today for electrophysiology evaluation.  She has a history of hypertension, hyperlipidemia, paroxysmal atrial fibrillation, diabetes, and OSA on CPAP.  She has been having occasional episodes of atrial fibrillation that is increased despite starting flecainide.  She has had been having side effects to flecainide.  She was recently admitted to the hospital with symptomatic anemia and melanotic stools.  Her Xarelto was stopped.  She had a capsule endoscopy which showed oozing blood from multiple low-grade lesions.  She is now status post AF ablation 05/29/2019.  Post AF ablation, she returned with atrial flutter and required cardioversion.  Today, denies symptoms of palpitations, chest pain, shortness of breath, orthopnea, PND, lower extremity edema, claudication, dizziness, presyncope, syncope, bleeding, or neurologic sequela. The patient is tolerating medications without difficulties.  Unfortunately, she is continued to have episodes of atrial fibrillation and has since developed atrial flutter.  She would prefer to have a repeat ablation.   Past Medical History:  Diagnosis Date   Acoustic neuroma [facility]) followed by dr brown at [facility]   right side w/ chronic roaring noise   Anticoagulant long-term use    xarelto   Benign paroxysmal positional vertigo    Endometrial polyp    Frequency of urination    Hyperlipidemia    Hypertension    OA (osteoarthritis)    left knee   OSA on CPAP    PAF (paroxysmal atrial fibrillation) [facility]) cardiologist-  dr Marlou Porch   dx 04/ 2017     PMB (postmenopausal bleeding)    TMJ (temporomandibular joint disorder)    left side-- wears guard   Type 2 diabetes mellitus ([place])    Wears contact lenses    Past Surgical History:  Procedure Laterality Date   ATRIAL FIBRILLATION ABLATION  05/29/2019   ATRIAL FIBRILLATION ABLATION N/A 05/29/2019   Procedure: ATRIAL FIBRILLATION ABLATION;  Surgeon: [name], MD;  Location: [place] [facility];  Service: Cardiovascular;  Laterality: N/A;   CARDIOVERSION N/A 06/24/2019   Procedure: CARDIOVERSION;  Surgeon: Skeet Latch, MD;  Location: [place];  Service: Cardiovascular;  Laterality: N/A;   CARPAL TUNNEL RELEASE Right 04/17/2007   w/ Excision ganglion cyst and Pulley Release right thumb   DILATATION & CURETTAGE/HYSTEROSCOPY WITH MYOSURE N/A 01/12/2017   Procedure: DILATATION & CURETTAGE/HYSTEROSCOPY WITH MYOSURE;  Surgeon: Arvella Nigh, MD;  Location: [place];  Service: Gynecology;  Laterality: N/A;   ESOPHAGOGASTRODUODENOSCOPY (EGD) WITH PROPOFOL N/A 09/16/2018   Procedure: ESOPHAGOGASTRODUODENOSCOPY (EGD) WITH PROPOFOL;  Surgeon: Ronald Lobo, MD;  Location: [place];  Service: Endoscopy;  Laterality: N/A;   GIVENS CAPSULE STUDY N/A 09/16/2018   Procedure: GIVENS CAPSULE STUDY;  Surgeon: Ronald Lobo, MD;  Location: [place];  Service: Endoscopy;  Laterality: N/A;   HAMMER TOE SURGERY Left 2010   left second toe   KNEE ARTHROSCOPY Right 2010   KNEE SURGERY Left 1993   LYMPH NODE BIOPSY N/A 03/01/2017   Procedure: SENTINEL LYMPH NODE BIOPSY;  Surgeon: Everitt Amber, MD;  Location: [facility];  Service: Gynecology;  Laterality: N/A;   NASAL  [facility] WITH BILATERAL SALPINGO OOPHERECTOMY Bilateral 03/01/2017   Procedure: XI ROBOTIC ASSISTED TOTAL HYSTERECTOMY WITH BILATERAL SALPINGO OOPHORECTOMY;  Surgeon: Everitt Amber, MD;  Location: [facility];  Service: Gynecology;  Laterality:  Bilateral;   ROTATOR CUFF REPAIR Right 04/2004   TOE SURGERY Right 2012   Great toe and second toe   TOTAL ELBOW REPLACEMENT Right 2000   TOTAL KNEE ARTHROPLASTY Right 01/12/2010   TRANSTHORACIC ECHOCARDIOGRAM  03/28/2016   ef 60-65%/ mild AR and MR   TUBAL LIGATION Bilateral 1978     Current Outpatient Medications  Medication Sig Dispense Refill   acetaminophen (TYLENOL) 500 MG tablet Take 500-1,000 mg by mouth every 8 (eight) hours as needed for moderate pain or headache.      atorvastatin (LIPITOR) 40 MG tablet Take 40 mg by mouth every morning.      betamethasone valerate (VALISONE) 0.1 % cream Apply 1 application topically 2 (two) times a week.      Cholecalciferol (VITAMIN D3) 5000 units TABS Take 5,000 Units by mouth daily.      Cranberry-Vitamin C (CRANBERRY CONCENTRATE/VITAMINC PO) Take by mouth.     docusate sodium (COLACE) 100 MG capsule Take 100 mg by mouth daily.     hydrochlorothiazide (HYDRODIURIL) 12.5 MG tablet hydrochlorothiazide 12.5 mg tablet     ipratropium (ATROVENT) 0.03 % nasal spray Place 2 sprays into both nostrils every 12 (twelve) hours as needed for rhinitis.     JANUVIA 100 MG tablet Take 100 mg by mouth daily after supper.      Lancets (ONETOUCH DELICA PLUS 123XX123) MISC      metoprolol succinate (TOPROL-XL) 50 MG 24 hr tablet Take 50 mg by mouth daily.     Multiple Vitamin (MULTIVITAMIN) tablet Take 1 tablet by mouth daily.     olmesartan (BENICAR) 20 MG tablet Take 20 mg by mouth daily.     ONE TOUCH ULTRA TEST test strip      Polyethyl Glycol-Propyl Glycol (SYSTANE OP) Place 1 drop into both eyes daily as needed (dry eyes).     rivaroxaban (XARELTO) 20 MG TABS tablet TAKE ONE TABLET BY MOUTH ONE TIME DAILY WITH SUPPER (Patient taking differently: Take 20 mg by mouth daily with supper. ) 90 tablet 1   saline (AYR) GEL Place 1 application into the nose at bedtime.     flecainide (TAMBOCOR) 100 MG tablet Take 1 tablet (100 mg  total) by mouth 2 (two) times daily. 180 tablet 1   No current facility-administered medications for this visit.     Allergies:   Cephalosporins and Augmentin [amoxicillin-pot clavulanate]   Social History:  The patient  reports that she has never smoked. She has never used smokeless tobacco. She reports that she does not drink alcohol or use drugs.   Family History:  The patient's family history includes Heart failure in her father and mother.    ROS:  Please see the history of present illness.   Otherwise, review of systems is positive for none.   All other systems are reviewed and negative.   PHYSICAL EXAM: VS:  BP (!) 126/58    Pulse 76    Ht 5\' 3"  (1.6 m)    Wt 212 lb 3.2 oz (96.3 kg)    SpO2 95%    BMI 37.59 kg/m  , BMI Body mass index is 37.59 kg/m. GEN: Well nourished, well developed, in no acute distress  HEENT: normal  Neck:  no JVD, carotid bruits, or masses Cardiac: RRR; no murmurs, rubs, or gallops,no edema  Respiratory:  clear to auscultation bilaterally, normal work of breathing GI: soft, nontender, nondistended, + BS MS: no deformity or atrophy  Skin: warm and dry Neuro:  Strength and sensation are intact Psych: euthymic mood, full affect  EKG:  EKG is ordered today. Personal review of the ekg ordered shows this rhythm, PACs, rate 76  Recent Labs: 09/16/2018: ALT 20 06/20/2019: BUN 14; Creatinine, Ser 0.68; Hemoglobin 15.7; Platelets 174; Potassium 4.3; Sodium 136    Lipid Panel  No results found for: CHOL, TRIG, HDL, CHOLHDL, VLDL, LDLCALC, LDLDIRECT   Wt Readings from Last 3 Encounters:  09/04/19 212 lb 3.2 oz (96.3 kg)  09/02/19 215 lb 5 oz (97.7 kg)  07/01/19 213 lb 9.6 oz (96.9 kg)      Other studies Reviewed: Additional studies/ records that were reviewed today include: TTE 2017  Review of the above records today demonstrates:  - Left ventricle: The cavity size was normal. Systolic function was   normal. The estimated ejection fraction was in  the range of 60%   to 65%. Wall motion was normal; there were no regional wall   motion abnormalities. The transmitral flow pattern was normal.   Left ventricular diastolic function parameters were normal. - Aortic valve: Moderate focal calcification, consistent with   sclerosis. There was mild regurgitation. - Mitral valve: There was mild regurgitation.   ASSESSMENT AND PLAN:  1.  Paroxysmal atrial fibrillation/flutter: Currently on Xarelto and flecainide.  She is status post AF ablation 05/29/2019.  Fortunately, she has continued to have both atrial fibrillation and atrial flutter.  At this point she would like to have a repeat ablation.  Due to her arrhythmias, we Najmah Carradine increase flecainide in the interim.  This patients CHA2DS2-VASc Score and unadjusted Ischemic Stroke Rate (% per year) is equal to 4.8 % stroke rate/year from a score of 4  Above score calculated as 1 point each if present [CHF, HTN, DM, Vascular=MI/PAD/Aortic Plaque, Age if 65-74, or Female] Above score calculated as 2 points each if present [Age > 75, or Stroke/TIA/TE]   2.  Obstructive sleep apnea: Continue CPAP  3.  Hypertension: Currently well controlled  4.  Hyperlipidemia: Continue atorvastatin  5.  Morbid obesity: Diet and exercise encouraged    Current medicines are reviewed at length with the patient today.   The patient does not have concerns regarding her medicines.  The following changes were made today: Increase flecainide  Labs/ tests ordered today include:  Orders Placed This Encounter  Procedures   EKG 12-Lead     Disposition:   FU with Andrina Locken 3 months  Signed, Alexys Gassett Meredith Leeds, MD  09/04/2019 10:52 AM     [place] [address] [phone] (office) [phone] (fax)

## 2019-09-09 DIAGNOSIS — R69 Illness, unspecified: Secondary | ICD-10-CM | POA: Diagnosis not present

## 2019-09-11 ENCOUNTER — Ambulatory Visit: Payer: Medicare HMO | Admitting: Podiatry

## 2019-09-11 ENCOUNTER — Other Ambulatory Visit: Payer: Self-pay

## 2019-09-11 ENCOUNTER — Encounter: Payer: Self-pay | Admitting: Podiatry

## 2019-09-11 DIAGNOSIS — M778 Other enthesopathies, not elsewhere classified: Secondary | ICD-10-CM

## 2019-09-13 NOTE — Progress Notes (Signed)
She presents today for follow-up of her capsulitis dorsal aspect right foot.  States that is really sore.  Objective: She has pain across the dorsum of the foot.  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  Plan: I injected 20 mg Kenalog 5 mg Marcaine point of maximal tenderness of the right foot.  Follow-up with her for another diabetic check in 6 months

## 2019-10-01 DIAGNOSIS — K921 Melena: Secondary | ICD-10-CM | POA: Diagnosis not present

## 2019-10-01 DIAGNOSIS — Z86010 Personal history of colonic polyps: Secondary | ICD-10-CM | POA: Diagnosis not present

## 2019-10-01 DIAGNOSIS — G4733 Obstructive sleep apnea (adult) (pediatric): Secondary | ICD-10-CM | POA: Diagnosis not present

## 2019-10-01 DIAGNOSIS — K5900 Constipation, unspecified: Secondary | ICD-10-CM | POA: Diagnosis not present

## 2019-10-01 DIAGNOSIS — Z7901 Long term (current) use of anticoagulants: Secondary | ICD-10-CM | POA: Diagnosis not present

## 2019-10-01 DIAGNOSIS — Z860101 Personal history of adenomatous and serrated colon polyps: Secondary | ICD-10-CM | POA: Insufficient documentation

## 2019-10-01 DIAGNOSIS — I480 Paroxysmal atrial fibrillation: Secondary | ICD-10-CM | POA: Diagnosis not present

## 2019-10-02 ENCOUNTER — Telehealth: Payer: Self-pay | Admitting: Cardiology

## 2019-10-02 DIAGNOSIS — I480 Paroxysmal atrial fibrillation: Secondary | ICD-10-CM

## 2019-10-02 DIAGNOSIS — K921 Melena: Secondary | ICD-10-CM | POA: Diagnosis not present

## 2019-10-02 NOTE — Telephone Encounter (Signed)
Follow up:     Patient only wants to talk with Judeen Hammans.

## 2019-10-02 NOTE — Telephone Encounter (Signed)
Spoke with pt and advised Erika Kim is off today and asked if I could assist.  She states that she was suppose to see GI and then call Sherri to discuss some things.  She said she would wait to speak to [facility].  Advised I will send the message to her so she can f/u when she returns to the office.

## 2019-10-02 NOTE — Telephone Encounter (Signed)
New message:     Patient calling stating the nurse told her to call back with some information.

## 2019-10-03 NOTE — Telephone Encounter (Signed)
Pt is not having colonoscopy anytime soon. Pt aware I will place orders for cardiac CT. Pt reports that she had blood work at GI yesterday.  I will call them next week for results. Will send instructions via Mychart. Patient verbalized understanding and agreeable to plan.

## 2019-10-08 ENCOUNTER — Telehealth: Payer: Self-pay | Admitting: Cardiology

## 2019-10-08 NOTE — Telephone Encounter (Signed)
New Message  Pt returning phone call regarding lab work  Please call

## 2019-10-08 NOTE — Telephone Encounter (Signed)
Informed pt I would look into labs and call her back only if there is abnormality or needed further testing

## 2019-10-09 DIAGNOSIS — K921 Melena: Secondary | ICD-10-CM | POA: Diagnosis not present

## 2019-10-09 DIAGNOSIS — M5416 Radiculopathy, lumbar region: Secondary | ICD-10-CM | POA: Insufficient documentation

## 2019-10-09 DIAGNOSIS — I480 Paroxysmal atrial fibrillation: Secondary | ICD-10-CM | POA: Diagnosis not present

## 2019-10-21 ENCOUNTER — Telehealth: Payer: Self-pay | Admitting: Cardiology

## 2019-10-21 ENCOUNTER — Encounter: Payer: Self-pay | Admitting: *Deleted

## 2019-10-21 DIAGNOSIS — I4891 Unspecified atrial fibrillation: Secondary | ICD-10-CM

## 2019-10-21 NOTE — Telephone Encounter (Signed)
Patient asking to speak with Sherri, advised she is not in the office today. She would like to know if she needs to take her medications the morning of her CT on 10/23/19

## 2019-10-21 NOTE — Telephone Encounter (Signed)
Spoke with the pt and went over her cardiac CT instructions verbatim, as Sherri RN sent to her mychart on 10/03/19.  Informed the pt that I will resend her Cardiac CT instructions as well as her ablation instructions Sherri RN sent to her, through her mychart account for review.  Pt verbalized understanding and agrees with this plan. Pt confirmed she received instructions in her mychart.

## 2019-10-22 ENCOUNTER — Other Ambulatory Visit: Payer: Medicare HMO | Admitting: *Deleted

## 2019-10-22 ENCOUNTER — Other Ambulatory Visit: Payer: Self-pay

## 2019-10-22 ENCOUNTER — Telehealth (HOSPITAL_COMMUNITY): Payer: Self-pay | Admitting: Emergency Medicine

## 2019-10-22 DIAGNOSIS — I4891 Unspecified atrial fibrillation: Secondary | ICD-10-CM | POA: Diagnosis not present

## 2019-10-22 NOTE — Telephone Encounter (Signed)
Spoke with patient, confirmed labwork is needed prior to upcoming ablation.  Lab orders entered, pt coming to lab today with friend who has a coumadin clinic appt.

## 2019-10-22 NOTE — Addendum Note (Signed)
Addended by: Trena Platt E on: 10/22/2019 11:42 AM   Modules accepted: Orders

## 2019-10-22 NOTE — Telephone Encounter (Signed)
Patient wanted to double check if she needs lab work done before her ablation. She will be bringing another  Patient to the Coumadin clinic tomorrow and can have labs drawn then if necessary. If no labs are needed, the patient does not need to be contacted. The patient only needs contacted if she needs labs done.

## 2019-10-22 NOTE — Telephone Encounter (Signed)
Left message on voicemail with name and callback number Chestine Belknap RN Navigator Cardiac Imaging [facility] [phone] Office [phone] Cell  

## 2019-10-23 ENCOUNTER — Ambulatory Visit (HOSPITAL_COMMUNITY)
Admission: RE | Admit: 2019-10-23 | Discharge: 2019-10-23 | Disposition: A | Discharge status: Home / Self Care | Payer: Medicare HMO | Source: Ambulatory Visit | Attending: Cardiology | Admitting: Cardiology

## 2019-10-23 ENCOUNTER — Telehealth (HOSPITAL_COMMUNITY): Payer: Self-pay | Admitting: Emergency Medicine

## 2019-10-23 ENCOUNTER — Other Ambulatory Visit: Payer: Self-pay | Admitting: Cardiology

## 2019-10-23 ENCOUNTER — Encounter (HOSPITAL_COMMUNITY): Payer: Self-pay

## 2019-10-23 DIAGNOSIS — I480 Paroxysmal atrial fibrillation: Secondary | ICD-10-CM

## 2019-10-23 LAB — CBC WITH DIFFERENTIAL/PLATELET
Basophils Absolute: 0 10*3/uL (ref 0.0–0.2)
Basos: 1 %
EOS (ABSOLUTE): 0.2 10*3/uL (ref 0.0–0.4)
Eos: 3 %
Hematocrit: 42.5 % (ref 34.0–46.6)
Hemoglobin: 14.7 g/dL (ref 11.1–15.9)
Immature Grans (Abs): 0 10*3/uL (ref 0.0–0.1)
Immature Granulocytes: 1 %
Lymphocytes Absolute: 2 10*3/uL (ref 0.7–3.1)
Lymphs: 27 %
MCH: 30.2 pg (ref 26.6–33.0)
MCHC: 34.6 g/dL (ref 31.5–35.7)
MCV: 87 fL (ref 79–97)
Monocytes Absolute: 0.6 10*3/uL (ref 0.1–0.9)
Monocytes: 8 %
Neutrophils Absolute: 4.5 10*3/uL (ref 1.4–7.0)
Neutrophils: 60 %
Platelets: 159 10*3/uL (ref 150–450)
RBC: 4.87 x10E6/uL (ref 3.77–5.28)
RDW: 12.4 % (ref 11.7–15.4)
WBC: 7.3 10*3/uL (ref 3.4–10.8)

## 2019-10-23 LAB — BASIC METABOLIC PANEL
BUN/Creatinine Ratio: 19 (ref 12–28)
BUN: 13 mg/dL (ref 8–27)
CO2: 27 mmol/L (ref 20–29)
Calcium: 10 mg/dL (ref 8.7–10.3)
Chloride: 98 mmol/L (ref 96–106)
Creatinine, Ser: 0.69 mg/dL (ref 0.57–1.00)
GFR calc Af Amer: 101 mL/min/{1.73_m2} (ref >59)
GFR calc non Af Amer: 88 mL/min/{1.73_m2} (ref >59)
Glucose: 114 mg/dL — ABNORMAL HIGH (ref 65–99)
Potassium: 4.2 mmol/L (ref 3.5–5.2)
Sodium: 140 mmol/L (ref 134–144)

## 2019-10-23 NOTE — Progress Notes (Signed)
Patient unable to get CT scan for pulmonary vein map & LA appendage due to poor IV access. Multiple attempts made by IR nurse and IV team but unable to start IV. Radiologist unavailable to start micropuncture today. CT navigator nurse informed of situation and will get in contact with ordering physician, patient schedule for ablation on 10/29/2019. Patient updated and informed of situation and states understanding. Patient also states she is scheduled for pre procedure COVID-19 testing on Saturday 10/25/2019. This information given to CT navigator nurse. Best contact number taken from patient [phone]) [phone] and instructed that CT navigator nurse will be contacting her with new appointment details for pre procedure testing.

## 2019-10-23 NOTE — Telephone Encounter (Signed)
Attempted to contact patient regarding new appt made for pre-ablation CT.  Date: 10/27/19  Time: 12 noon  With IR micropuncture appt made for 11:00a just prior for IV access.  Left detailed message with these details. Will attempt to call patient again this afternoon.  Marchia Bond RN Navigator Cardiac Imaging [facility] [phone] Office  [phone] Cell

## 2019-10-23 NOTE — Telephone Encounter (Signed)
Reaching out to patient to offer assistance regarding upcoming cardiac imaging study; pt verbalizes understanding of appt date/time, parking situation and where to check in, pre-test NPO status and medications ordered, and verified current allergies; name and call back number provided for further questions should they arise Teron Blais RN Navigator Cardiac Imaging [facility] [phone] office [phone] cell 

## 2019-10-25 ENCOUNTER — Other Ambulatory Visit (HOSPITAL_COMMUNITY)
Admission: RE | Admit: 2019-10-25 | Discharge: 2019-10-25 | Disposition: A | Discharge status: Home / Self Care | Payer: Medicare HMO | Source: Ambulatory Visit | Attending: Cardiology | Admitting: Cardiology

## 2019-10-25 DIAGNOSIS — Z20822 Contact with and (suspected) exposure to covid-19: Secondary | ICD-10-CM | POA: Diagnosis not present

## 2019-10-25 DIAGNOSIS — Z01812 Encounter for preprocedural laboratory examination: Secondary | ICD-10-CM | POA: Diagnosis not present

## 2019-10-27 ENCOUNTER — Ambulatory Visit (HOSPITAL_COMMUNITY)
Admission: RE | Admit: 2019-10-27 | Discharge: 2019-10-27 | Disposition: A | Discharge status: Home / Self Care | Payer: Medicare HMO | Source: Ambulatory Visit | Attending: Cardiology | Admitting: Cardiology

## 2019-10-27 ENCOUNTER — Other Ambulatory Visit: Payer: Self-pay

## 2019-10-27 ENCOUNTER — Other Ambulatory Visit: Payer: Self-pay | Admitting: Cardiology

## 2019-10-27 DIAGNOSIS — I480 Paroxysmal atrial fibrillation: Secondary | ICD-10-CM | POA: Diagnosis not present

## 2019-10-27 DIAGNOSIS — I872 Venous insufficiency (chronic) (peripheral): Secondary | ICD-10-CM | POA: Diagnosis not present

## 2019-10-27 DIAGNOSIS — Z452 Encounter for adjustment and management of vascular access device: Secondary | ICD-10-CM | POA: Diagnosis not present

## 2019-10-27 HISTORY — PX: IR US GUIDE VASC ACCESS RIGHT: IMG2390

## 2019-10-27 HISTORY — PX: IR RADIOLOGY PERIPHERAL GUIDED IV START: IMG5598

## 2019-10-27 LAB — NOVEL CORONAVIRUS, NAA (HOSP ORDER, SEND-OUT TO REF LAB; TAT 18-24 HRS): SARS-CoV-2, NAA: NOT DETECTED

## 2019-10-27 MED ORDER — LIDOCAINE HCL (PF) 1 % IJ SOLN
INTRAMUSCULAR | Status: DC | PRN
  Administered 2019-10-27: 5 mL

## 2019-10-27 MED ORDER — LIDOCAINE HCL 1 % IJ SOLN
INTRAMUSCULAR | Status: AC
  Filled 2019-10-27: qty 20

## 2019-10-27 MED ORDER — IOHEXOL 350 MG/ML SOLN
100.0000 mL | Freq: Once | INTRAVENOUS | Status: AC | PRN
  Administered 2019-10-27: 13:00:00 100 mL via INTRAVENOUS

## 2019-10-29 ENCOUNTER — Other Ambulatory Visit: Payer: Self-pay

## 2019-10-29 ENCOUNTER — Ambulatory Visit (HOSPITAL_COMMUNITY): Payer: Medicare HMO | Admitting: Certified Registered Nurse Anesthetist

## 2019-10-29 ENCOUNTER — Ambulatory Visit (HOSPITAL_COMMUNITY)
Admission: RE | Admit: 2019-10-29 | Discharge: 2019-10-29 | Disposition: A | Discharge status: Home / Self Care | Payer: Medicare HMO | Attending: Cardiology | Admitting: Cardiology

## 2019-10-29 ENCOUNTER — Ambulatory Visit (HOSPITAL_COMMUNITY)
Admission: RE | Disposition: A | Discharge status: Home / Self Care | Payer: Medicare HMO | Source: Home / Self Care | Attending: Cardiology

## 2019-10-29 ENCOUNTER — Encounter (HOSPITAL_COMMUNITY): Payer: Self-pay | Admitting: Cardiology

## 2019-10-29 DIAGNOSIS — I483 Typical atrial flutter: Secondary | ICD-10-CM | POA: Diagnosis not present

## 2019-10-29 DIAGNOSIS — E119 Type 2 diabetes mellitus without complications: Secondary | ICD-10-CM | POA: Insufficient documentation

## 2019-10-29 DIAGNOSIS — I480 Paroxysmal atrial fibrillation: Secondary | ICD-10-CM | POA: Insufficient documentation

## 2019-10-29 DIAGNOSIS — Z880 Allergy status to penicillin: Secondary | ICD-10-CM | POA: Insufficient documentation

## 2019-10-29 DIAGNOSIS — I10 Essential (primary) hypertension: Secondary | ICD-10-CM | POA: Diagnosis not present

## 2019-10-29 DIAGNOSIS — I484 Atypical atrial flutter: Secondary | ICD-10-CM | POA: Diagnosis not present

## 2019-10-29 DIAGNOSIS — I4892 Unspecified atrial flutter: Secondary | ICD-10-CM | POA: Insufficient documentation

## 2019-10-29 DIAGNOSIS — E785 Hyperlipidemia, unspecified: Secondary | ICD-10-CM | POA: Diagnosis not present

## 2019-10-29 DIAGNOSIS — Z881 Allergy status to other antibiotic agents status: Secondary | ICD-10-CM | POA: Insufficient documentation

## 2019-10-29 DIAGNOSIS — G4733 Obstructive sleep apnea (adult) (pediatric): Secondary | ICD-10-CM | POA: Diagnosis not present

## 2019-10-29 DIAGNOSIS — C541 Malignant neoplasm of endometrium: Secondary | ICD-10-CM | POA: Diagnosis not present

## 2019-10-29 HISTORY — PX: ATRIAL FIBRILLATION ABLATION: EP1191

## 2019-10-29 LAB — GLUCOSE, CAPILLARY
Glucose-Capillary: 85 mg/dL (ref 70–99)
Glucose-Capillary: 124 mg/dL — ABNORMAL HIGH (ref 70–99)

## 2019-10-29 LAB — POCT ACTIVATED CLOTTING TIME: Activated Clotting Time: 411 seconds

## 2019-10-29 SURGERY — ATRIAL FIBRILLATION ABLATION
Anesthesia: General

## 2019-10-29 MED ORDER — SODIUM CHLORIDE 0.9% FLUSH: 3.0000 mL | Freq: Two times a day (BID) | INTRAVENOUS | Status: DC

## 2019-10-29 MED ORDER — DOBUTAMINE IN D5W 4-5 MG/ML-% IV SOLN
INTRAVENOUS | Status: AC
  Filled 2019-10-29: qty 250

## 2019-10-29 MED ORDER — ACETAMINOPHEN 325 MG PO TABS: 650.0000 mg | ORAL_TABLET | ORAL | Status: DC | PRN

## 2019-10-29 MED ORDER — LIDOCAINE 2% (20 MG/ML) 5 ML SYRINGE
INTRAMUSCULAR | Status: DC | PRN
  Administered 2019-10-29: 60 mg via INTRAVENOUS

## 2019-10-29 MED ORDER — PROTAMINE SULFATE 10 MG/ML IV SOLN
INTRAVENOUS | Status: DC | PRN
  Administered 2019-10-29: 50 mg via INTRAVENOUS

## 2019-10-29 MED ORDER — POLYMYXIN B-TRIMETHOPRIM 10000-0.1 UNIT/ML-% OP SOLN
1.0000 [drp] | Freq: Three times a day (TID) | OPHTHALMIC | Status: DC
  Administered 2019-10-29: 1 [drp] via OPHTHALMIC
  Filled 2019-10-29: qty 10

## 2019-10-29 MED ORDER — KETOROLAC TROMETHAMINE 0.5 % OP SOLN
1.0000 [drp] | Freq: Three times a day (TID) | OPHTHALMIC | Status: DC | PRN
  Administered 2019-10-29: 1 [drp] via OPHTHALMIC
  Filled 2019-10-29 (×2): qty 5

## 2019-10-29 MED ORDER — PROPOFOL 10 MG/ML IV BOLUS
INTRAVENOUS | Status: DC | PRN
  Administered 2019-10-29: 130 mg via INTRAVENOUS

## 2019-10-29 MED ORDER — HEPARIN SODIUM (PORCINE) 1000 UNIT/ML IJ SOLN
INTRAMUSCULAR | Status: AC
  Filled 2019-10-29: qty 1

## 2019-10-29 MED ORDER — HEPARIN SODIUM (PORCINE) 1000 UNIT/ML IJ SOLN
INTRAMUSCULAR | Status: DC | PRN
  Administered 2019-10-29: 14000 [IU] via INTRAVENOUS

## 2019-10-29 MED ORDER — FENTANYL CITRATE (PF) 250 MCG/5ML IJ SOLN
INTRAMUSCULAR | Status: DC | PRN
  Administered 2019-10-29: 100 ug via INTRAVENOUS

## 2019-10-29 MED ORDER — BSS IO SOLN
15.0000 mL | Freq: Once | INTRAOCULAR | Status: AC
  Administered 2019-10-29: 15 mL
  Filled 2019-10-29: qty 15

## 2019-10-29 MED ORDER — SODIUM CHLORIDE 0.9 % IV SOLN: 250.0000 mL | INTRAVENOUS | Status: DC | PRN

## 2019-10-29 MED ORDER — ROCURONIUM BROMIDE 50 MG/5ML IV SOSY
PREFILLED_SYRINGE | INTRAVENOUS | Status: DC | PRN
  Administered 2019-10-29: 60 mg via INTRAVENOUS

## 2019-10-29 MED ORDER — DOBUTAMINE IN D5W 4-5 MG/ML-% IV SOLN
INTRAVENOUS | Status: DC | PRN
  Administered 2019-10-29: 20 ug/kg/min via INTRAVENOUS

## 2019-10-29 MED ORDER — HEPARIN (PORCINE) IN NACL 1000-0.9 UT/500ML-% IV SOLN
INTRAVENOUS | Status: DC | PRN
  Administered 2019-10-29 (×5): 500 mL

## 2019-10-29 MED ORDER — BUPIVACAINE HCL (PF) 0.25 % IJ SOLN
INTRAMUSCULAR | Status: AC
  Filled 2019-10-29: qty 30

## 2019-10-29 MED ORDER — SUGAMMADEX SODIUM 200 MG/2ML IV SOLN
INTRAVENOUS | Status: DC | PRN
  Administered 2019-10-29: 200 mg via INTRAVENOUS

## 2019-10-29 MED ORDER — FENTANYL CITRATE (PF) 100 MCG/2ML IJ SOLN
INTRAMUSCULAR | Status: AC
  Filled 2019-10-29: qty 2

## 2019-10-29 MED ORDER — SODIUM CHLORIDE 0.9% FLUSH: 3.0000 mL | INTRAVENOUS | Status: DC | PRN

## 2019-10-29 MED ORDER — HEPARIN SODIUM (PORCINE) 1000 UNIT/ML IJ SOLN
INTRAMUSCULAR | Status: DC | PRN
  Administered 2019-10-29: 1000 [IU] via INTRAVENOUS

## 2019-10-29 MED ORDER — ONDANSETRON HCL 4 MG/2ML IJ SOLN: 4.0000 mg | Freq: Four times a day (QID) | INTRAMUSCULAR | Status: DC | PRN

## 2019-10-29 MED ORDER — DEXAMETHASONE SODIUM PHOSPHATE 10 MG/ML IJ SOLN
INTRAMUSCULAR | Status: DC | PRN
  Administered 2019-10-29: 5 mg via INTRAVENOUS

## 2019-10-29 MED ORDER — HEPARIN (PORCINE) IN NACL 1000-0.9 UT/500ML-% IV SOLN
INTRAVENOUS | Status: AC
  Filled 2019-10-29: qty 2500

## 2019-10-29 MED ORDER — SODIUM CHLORIDE 0.9 % IV SOLN: INTRAVENOUS | Status: DC

## 2019-10-29 MED ORDER — ONDANSETRON HCL 4 MG/2ML IJ SOLN
INTRAMUSCULAR | Status: DC | PRN
  Administered 2019-10-29: 4 mg via INTRAVENOUS

## 2019-10-29 MED ORDER — BUPIVACAINE HCL (PF) 0.25 % IJ SOLN
INTRAMUSCULAR | Status: DC | PRN
  Administered 2019-10-29: 45 mL

## 2019-10-29 SURGICAL SUPPLY — 23 items
BLANKET WARM UNDERBOD FULL ACC (MISCELLANEOUS) ×2 IMPLANT
CATH 8FR REPROCESSED SOUNDSTAR (CATHETERS) ×2 IMPLANT
CATH 8FR SOUNDSTAR REPROCESSED (CATHETERS) IMPLANT
CATH MAPPNG PENTARAY F 2-6-2MM (CATHETERS) IMPLANT
CATH SMTCH THERMOCOOL SF DF (CATHETERS) ×1 IMPLANT
CATH WEBSTER BI DIR CS D-F CRV (CATHETERS) ×1 IMPLANT
COVER SWIFTLINK CONNECTOR (BAG) ×2 IMPLANT
DEVICE CLOSURE PERCLS PRGLD 6F (VASCULAR PRODUCTS) IMPLANT
PACK EP LATEX FREE (CUSTOM PROCEDURE TRAY) ×1
PACK EP LF (CUSTOM PROCEDURE TRAY) ×1 IMPLANT
PAD PRO RADIOLUCENT 2001M-C (PAD) ×2 IMPLANT
PATCH CARTO3 (PAD) ×1 IMPLANT
PENTARAY F 2-6-2MM (CATHETERS) ×2
PERCLOSE PROGLIDE 6F (VASCULAR PRODUCTS) ×8
SHEATH BAYLIS SUREFLEX  M 8.5 (SHEATH) ×1
SHEATH BAYLIS SUREFLEX M 8.5 (SHEATH) IMPLANT
SHEATH BAYLIS TRANSSEPTAL 98CM (NEEDLE) ×1 IMPLANT
SHEATH CARTO VIZIGO SM CVD (SHEATH) ×1 IMPLANT
SHEATH PINNACLE 7F 10CM (SHEATH) ×1 IMPLANT
SHEATH PINNACLE 8F 10CM (SHEATH) ×2 IMPLANT
SHEATH PINNACLE 9F 10CM (SHEATH) ×1 IMPLANT
SHEATH PROBE COVER 6X72 (BAG) ×1 IMPLANT
TUBING SMART ABLATE COOLFLOW (TUBING) ×1 IMPLANT

## 2019-10-29 NOTE — H&P (Signed)
Electrophysiology Office Note   Date:  10/29/2019   ID:  Erika, Kim May 20, 1946, MRN [id number]  PCP:  Erika Battles, MD  Cardiologist:  Marlou Porch Primary Electrophysiologist:  Erika Kim Erika Leeds, MD    No chief complaint on file.    History of Present Illness: Erika Kim is a 74 y.o. female who is being seen today for the evaluation of atrial fibrillation at the request of Erika Kim. Presenting today for electrophysiology evaluation.  She has a history of hypertension, hyperlipidemia, paroxysmal atrial fibrillation, diabetes, and OSA on CPAP.  She has been having occasional episodes of atrial fibrillation that is increased despite starting flecainide.  She has had been having side effects to flecainide.  She was recently admitted to the hospital with symptomatic anemia and melanotic stools.  Her Xarelto was stopped.  She had a capsule endoscopy which showed oozing blood from multiple low-grade lesions.  She is now status post AF ablation 05/29/2019.  Post AF ablation, she returned with atrial flutter and required cardioversion.  Today, denies symptoms of palpitations, chest pain, shortness of breath, orthopnea, PND, lower extremity edema, claudication, dizziness, presyncope, syncope, bleeding, or neurologic sequela. The patient is tolerating medications without difficulties. Plan for AF ablaiton today.   Past Medical History:  Diagnosis Date  . Acoustic neuroma ([place]) followed by dr brown at [facility]   right side w/ chronic roaring noise  . Anticoagulant long-term use    xarelto  . Benign paroxysmal positional vertigo   . Endometrial polyp   . Frequency of urination   . Hyperlipidemia   . Hypertension   . OA (osteoarthritis)    left knee  . OSA on CPAP   . PAF (paroxysmal atrial fibrillation) [facility]) cardiologist-  dr Marlou Porch   dx 04/ 2017  . PMB (postmenopausal bleeding)   . TMJ (temporomandibular joint disorder)    left side-- wears guard  . Type 2 diabetes  mellitus ([place])   . Wears contact lenses    Past Surgical History:  Procedure Laterality Date  . ATRIAL FIBRILLATION ABLATION  05/29/2019  . ATRIAL FIBRILLATION ABLATION N/A 05/29/2019   Procedure: ATRIAL FIBRILLATION ABLATION;  Surgeon: [name], MD;  Location: [place] [facility];  Service: Cardiovascular;  Laterality: N/A;  . CARDIOVERSION N/A 06/24/2019   Procedure: CARDIOVERSION;  Surgeon: Skeet Latch, MD;  Location: [place];  Service: Cardiovascular;  Laterality: N/A;  . CARPAL TUNNEL RELEASE Right 04/17/2007   w/ Excision ganglion cyst and Pulley Release right thumb  . DILATATION & CURETTAGE/HYSTEROSCOPY WITH MYOSURE N/A 01/12/2017   Procedure: DILATATION & CURETTAGE/HYSTEROSCOPY WITH MYOSURE;  Surgeon: Arvella Nigh, MD;  Location: [place];  Service: Gynecology;  Laterality: N/A;  . ESOPHAGOGASTRODUODENOSCOPY (EGD) WITH PROPOFOL N/A 09/16/2018   Procedure: ESOPHAGOGASTRODUODENOSCOPY (EGD) WITH PROPOFOL;  Surgeon: Ronald Lobo, MD;  Location: [place];  Service: Endoscopy;  Laterality: N/A;  . GIVENS CAPSULE STUDY N/A 09/16/2018   Procedure: GIVENS CAPSULE STUDY;  Surgeon: Ronald Lobo, MD;  Location: [place];  Service: Endoscopy;  Laterality: N/A;  . HAMMER TOE SURGERY Left 2010   left second toe  . IR RADIOLOGY PERIPHERAL GUIDED IV START  10/27/2019  . IR US GUIDE VASC ACCESS RIGHT  10/27/2019  . KNEE ARTHROSCOPY Right 2010  . KNEE SURGERY Left 1993  . LYMPH NODE BIOPSY N/A 03/01/2017   Procedure: SENTINEL LYMPH NODE BIOPSY;  Surgeon: Everitt Amber, MD;  Location: [facility];  Service: Gynecology;  Laterality: N/A;  . NASAL SINUS SURGERY  1985 and 1995  . ROBOTIC ASSISTED TOTAL HYSTERECTOMY WITH BILATERAL SALPINGO OOPHERECTOMY Bilateral 03/01/2017   Procedure: XI ROBOTIC ASSISTED TOTAL HYSTERECTOMY WITH BILATERAL SALPINGO OOPHORECTOMY;  Surgeon: Everitt Amber, MD;  Location: [facility];  Service: Gynecology;  Laterality: Bilateral;  . ROTATOR CUFF  REPAIR Right 04/2004  . TOE SURGERY Right 2012   Great toe and second toe  . TOTAL ELBOW REPLACEMENT Right 2000  . TOTAL KNEE ARTHROPLASTY Right 01/12/2010  . TRANSTHORACIC ECHOCARDIOGRAM  03/28/2016   ef 60-65%/ mild AR and MR  . TUBAL LIGATION Bilateral 1978     Current Facility-Administered Medications  Medication Dose Route Frequency Provider Last Rate Last Admin  . 0.9 %  sodium chloride infusion   Intravenous Continuous [name], MD 50 mL/hr at 10/29/19 1028 New Bag at 10/29/19 1028    Allergies:   Cephalosporins and Augmentin [amoxicillin-pot clavulanate]   Social History:  The patient  reports that she has never smoked. She has never used smokeless tobacco. She reports that she does not drink alcohol or use drugs.   Family History:  The patient's family history includes Heart failure in her father and mother.    ROS:  Please see the history of present illness.   Otherwise, review of systems is positive for none.   All other systems are reviewed and negative.   PHYSICAL EXAM: VS:  BP (!) 143/64   Pulse 70   Temp (!) 97.3 F (36.3 C) (Skin)   Resp 16   Ht 5\' 3"  (1.6 m)   Wt 97.1 kg   SpO2 100%   BMI 37.91 kg/m  , BMI Body mass index is 37.91 kg/m. GEN: Well nourished, well developed, in no acute distress  HEENT: normal  Neck: no JVD, carotid bruits, or masses Cardiac: RRR; no murmurs, rubs, or gallops,no edema  Respiratory:  clear to auscultation bilaterally, normal work of breathing GI: soft, nontender, nondistended, + BS MS: no deformity or atrophy  Skin: warm and dry Neuro:  Strength and sensation are intact Psych: euthymic mood, full affect   Recent Labs: 10/22/2019: BUN 13; Creatinine, Ser 0.69; Hemoglobin 14.7; Platelets 159; Potassium 4.2; Sodium 140    Lipid Panel  No results found for: CHOL, TRIG, HDL, CHOLHDL, VLDL, LDLCALC, LDLDIRECT   Wt Readings from Last 3 Encounters:  10/29/19 97.1 kg  09/04/19 96.3 kg  09/02/19 97.7 kg       Other studies Reviewed: Additional studies/ records that were reviewed today include: TTE 2017  Review of the above records today demonstrates:  - Left ventricle: The cavity size was normal. Systolic function was   normal. The estimated ejection fraction was in the range of 60%   to 65%. Wall motion was normal; there were no regional wall   motion abnormalities. The transmitral flow pattern was normal.   Left ventricular diastolic function parameters were normal. - Aortic valve: Moderate focal calcification, consistent with   sclerosis. There was mild regurgitation. - Mitral valve: There was mild regurgitation.   ASSESSMENT AND PLAN:  1.  Paroxysmal atrial fibrillation/flutter: Plan for ablation today.  SHARRAH BRUSSEAU has presented today for surgery, with the diagnosis of atrial fibrillation.  The various methods of treatment have been discussed with the patient and family. After consideration of risks, benefits and other options for treatment, the patient has consented to  Procedure(s): Catheter ablation as a surgical intervention .  Risks include but not limited to bleeding, tamponade, heart block, stroke, damage to surrounding organs, among others.  The patient's history has been reviewed, patient examined, no change in status, stable for surgery.  I have reviewed the patient's chart and labs.  Questions were answered to the patient's satisfaction.    Sharron Simpson Curt Bears, MD 10/29/2019 10:59 AM

## 2019-10-29 NOTE — Anesthesia Preprocedure Evaluation (Addendum)
Anesthesia Evaluation  Patient identified by MRN, date of birth, ID band Patient awake    Reviewed: Allergy & Precautions, NPO status , Patient's Chart, lab work & pertinent test results  History of Anesthesia Complications Negative for: history of anesthetic complications  Airway Mallampati: II  TM Distance: >3 FB Neck ROM: Full    Dental  (+) Teeth Intact   Pulmonary sleep apnea and Continuous Positive Airway Pressure Ventilation ,    Pulmonary exam normal        Cardiovascular hypertension, Normal cardiovascular exam+ dysrhythmias Atrial Fibrillation      Neuro/Psych Acoustic neuroma negative psych ROS   GI/Hepatic negative GI ROS, Neg liver ROS,   Endo/Other  diabetes, Type 2  Renal/GU negative Renal ROS  negative genitourinary   Musculoskeletal negative musculoskeletal ROS (+)   Abdominal   Peds  Hematology  (+) anemia ,   Anesthesia Other Findings Echo 2017: EF 60-65%, mild MR/AR  Reproductive/Obstetrics                           Anesthesia Physical Anesthesia Plan  ASA: III  Anesthesia Plan: General   Post-op Pain Management:    Induction: Intravenous  PONV Risk Score and Plan: 3 and Ondansetron, Dexamethasone, Treatment may vary due to age or medical condition and Midazolam  Airway Management Planned: Oral ETT  Additional Equipment: None  Intra-op Plan:   Post-operative Plan: Extubation in OR  Informed Consent: I have reviewed the patients History and Physical, chart, labs and discussed the procedure including the risks, benefits and alternatives for the proposed anesthesia with the patient or authorized representative who has indicated his/her understanding and acceptance.     Dental advisory given  Plan Discussed with:   Anesthesia Plan Comments:         Anesthesia Quick Evaluation

## 2019-10-29 NOTE — Anesthesia Procedure Notes (Signed)
Procedure Name: Intubation Date/Time: 10/29/2019 11:58 AM Performed by: Shirlyn Goltz, CRNA Pre-anesthesia Checklist: Patient identified, Emergency Drugs available, Suction available and Patient being monitored Patient Re-evaluated:Patient Re-evaluated prior to induction Oxygen Delivery Method: Circle system utilized Preoxygenation: Pre-oxygenation with 100% oxygen Induction Type: IV induction Ventilation: Mask ventilation without difficulty Laryngoscope Size: Mac and 4 Grade View: Grade I Tube type: Oral Tube size: 7.0 mm Number of attempts: 1 Airway Equipment and Method: Stylet Placement Confirmation: ETT inserted through vocal cords under direct vision,  positive ETCO2 and breath sounds checked- equal and bilateral Secured at: 22 cm Tube secured with: Tape Dental Injury: Teeth and Oropharynx as per pre-operative assessment

## 2019-10-29 NOTE — Discharge Instructions (Signed)
CALL [phone] IF PAIN LEFT EYE NOT SIGNIFICANTLY IMPROVED TOMORROW; AS YOU WOULD NEED TO BE REFERRED FOR AN OPHTHAMOLOGY APPOINTMENT   Cardiac Ablation, Care After This sheet gives you information about how to care for yourself after your procedure. Your health care provider may also give you more specific instructions. If you have problems or questions, contact your health care provider. What can I expect after the procedure? After the procedure, it is common to have:  Bruising around your puncture site.  Tenderness around your puncture site.  Skipped heartbeats.  Tiredness (fatigue). Follow these instructions at home: Puncture site care   Follow instructions from your health care provider about how to take care of your puncture site. Make sure you: ? Wash your hands with soap and water before you change your bandage (dressing). If soap and water are not available, use hand sanitizer. ? Change your dressing as told by your health care provider. ? Leave stitches (sutures), skin glue, or adhesive strips in place. These skin closures may need to stay in place for up to 2 weeks. If adhesive strip edges start to loosen and curl up, you may trim the loose edges. Do not remove adhesive strips completely unless your health care provider tells you to do that.  Check your puncture site every day for signs of infection. Check for: ? Redness, swelling, or pain. ? Fluid or blood. If your puncture site starts to bleed, lie down on your back, apply firm pressure to the area, and contact your health care provider. ? Warmth. ? Pus or a bad smell. Driving  Ask your health care provider when it is safe for you to drive again after the procedure.  Do not drive or use heavy machinery while taking prescription pain medicine.  Do not drive for 24 hours if you were given a medicine to help you relax (sedative) during your procedure. Activity  Avoid activities that take a lot of effort for at least 3  days after your procedure.  Do not lift anything that is heavier than 10 lb (4.5 kg), or the limit that you are told, until your health care provider says that it is safe.  Return to your normal activities as told by your health care provider. Ask your health care provider what activities are safe for you. General instructions  Take over-the-counter and prescription medicines only as told by your health care provider.  Do not use any products that contain nicotine or tobacco, such as cigarettes and e-cigarettes. If you need help quitting, ask your health care provider.  Do not take baths, swim, or use a hot tub until your health care provider approves.  Do not drink alcohol for 24 hours after your procedure.  Keep all follow-up visits as told by your health care provider. This is important. Contact a health care provider if:  You have redness, mild swelling, or pain around your puncture site.  You have fluid or blood coming from your puncture site that stops after applying firm pressure to the area.  Your puncture site feels warm to the touch.  You have pus or a bad smell coming from your puncture site.  You have a fever.  You have chest pain or discomfort that spreads to your neck, jaw, or arm.  You are sweating a lot.  You feel nauseous.  You have a fast or irregular heartbeat.  You have shortness of breath.  You are dizzy or light-headed and feel the need to lie down.  You have pain or numbness in the arm or leg closest to your puncture site. Get help right away if:  Your puncture site suddenly swells.  Your puncture site is bleeding and the bleeding does not stop after applying firm pressure to the area. These symptoms may represent a serious problem that is an emergency. Do not wait to see if the symptoms will go away. Get medical help right away. Call your local emergency services (911 in the [place] Do not drive yourself to the hospital. Summary  After the  procedure, it is normal to have bruising and tenderness at the puncture site in your groin, neck, or forearm.  Check your puncture site every day for signs of infection.  Get help right away if your puncture site is bleeding and the bleeding does not stop after applying firm pressure to the area. This is a medical emergency. This information is not intended to replace advice given to you by your health care provider. Make sure you discuss any questions you have with your health care provider. Document Revised: 09/21/2017 Document Reviewed: 01/18/2017 Elsevier Patient Education  [place].

## 2019-10-29 NOTE — Progress Notes (Addendum)
States. " left eye watering and feels like something in it"; called anesthesiologist Dr Kalman Shan and he will be in to see client

## 2019-10-29 NOTE — Transfer of Care (Signed)
Immediate Anesthesia Transfer of Care Note  Patient: Erika Kim  Procedure(s) Performed: ATRIAL FIBRILLATION ABLATION (N/A )  Patient Location: Cath Lab  Anesthesia Type:General  Level of Consciousness: awake and alert   Airway & Oxygen Therapy: Patient Spontanous Breathing and Patient connected to nasal cannula oxygen  Post-op Assessment: Report given to RN and Post -op Vital signs reviewed and stable  Post vital signs: Reviewed and stable  Last Vitals:  Vitals Value Taken Time  BP    Temp    Pulse 82 10/29/19 1414  Resp 18 10/29/19 1414  SpO2 95 % 10/29/19 1414  Vitals shown include unvalidated device data.  Last Pain:  Vitals:   10/29/19 1011  TempSrc:   PainSc: 0-No pain      Patients Stated Pain Goal: 3 ([id number])  Complications: No apparent anesthesia complications

## 2019-10-29 NOTE — Anesthesia Postprocedure Evaluation (Signed)
Anesthesia Post Note  Patient: Erika Kim  Procedure(s) Performed: ATRIAL FIBRILLATION ABLATION (N/A )     Patient location during evaluation: PACU Anesthesia Type: General Level of consciousness: awake and alert Pain management: pain level controlled Vital Signs Assessment: post-procedure vital signs reviewed and stable Respiratory status: spontaneous breathing, nonlabored ventilation, respiratory function stable and patient connected to nasal cannula oxygen Cardiovascular status: blood pressure returned to baseline and stable Postop Assessment: no apparent nausea or vomiting Anesthetic complications: yes Anesthetic complication details: injury of corneaComments: Called to evaluate patient L eye for abrasion. Patient feels like a foreign body is there,eye  very red and irritated in appearance Corneal abrasion order set started. Instructions given to patient to call us Friday if no improvement for possible ophthalmologic     Last Vitals:  Vitals:   10/29/19 1535 10/29/19 1550  BP: (!) 124/104 (!) 150/83  Pulse: 82 84  Resp: 19 19  Temp:    SpO2: 96% 97%    Last Pain:  Vitals:   10/29/19 1526  TempSrc:   PainSc: 0-No pain                 Ronnel Zuercher S

## 2019-10-30 MED FILL — Fentanyl Citrate Preservative Free (PF) Inj 100 MCG/2ML: INTRAMUSCULAR | Qty: 2 | Status: AC

## 2019-11-04 ENCOUNTER — Encounter (HOSPITAL_COMMUNITY): Payer: Self-pay | Admitting: *Deleted

## 2019-11-12 DIAGNOSIS — R69 Illness, unspecified: Secondary | ICD-10-CM | POA: Diagnosis not present

## 2019-11-18 ENCOUNTER — Ambulatory Visit: Payer: Medicare HMO

## 2019-11-19 DIAGNOSIS — R69 Illness, unspecified: Secondary | ICD-10-CM | POA: Diagnosis not present

## 2019-11-21 ENCOUNTER — Ambulatory Visit (HOSPITAL_COMMUNITY)
Admission: RE | Admit: 2019-11-21 | Discharge: 2019-11-21 | Disposition: A | Discharge status: Home / Self Care | Payer: Medicare HMO | Source: Ambulatory Visit | Attending: Physician Assistant | Admitting: Physician Assistant

## 2019-11-21 ENCOUNTER — Other Ambulatory Visit: Payer: Self-pay

## 2019-11-21 ENCOUNTER — Encounter (HOSPITAL_COMMUNITY): Payer: Self-pay | Admitting: Physician Assistant

## 2019-11-21 VITALS — BP 130/76 | HR 61 | Ht 63.0 in | Wt 214.4 lb

## 2019-11-21 DIAGNOSIS — Z7901 Long term (current) use of anticoagulants: Secondary | ICD-10-CM | POA: Diagnosis not present

## 2019-11-21 DIAGNOSIS — E118 Type 2 diabetes mellitus with unspecified complications: Secondary | ICD-10-CM | POA: Diagnosis not present

## 2019-11-21 DIAGNOSIS — I10 Essential (primary) hypertension: Secondary | ICD-10-CM | POA: Insufficient documentation

## 2019-11-21 DIAGNOSIS — Z6837 Body mass index (BMI) 37.0-37.9, adult: Secondary | ICD-10-CM | POA: Insufficient documentation

## 2019-11-21 DIAGNOSIS — D6869 Other thrombophilia: Secondary | ICD-10-CM

## 2019-11-21 DIAGNOSIS — I480 Paroxysmal atrial fibrillation: Secondary | ICD-10-CM | POA: Diagnosis not present

## 2019-11-21 DIAGNOSIS — I4892 Unspecified atrial flutter: Secondary | ICD-10-CM | POA: Diagnosis not present

## 2019-11-21 DIAGNOSIS — G4733 Obstructive sleep apnea (adult) (pediatric): Secondary | ICD-10-CM | POA: Insufficient documentation

## 2019-11-21 DIAGNOSIS — E785 Hyperlipidemia, unspecified: Secondary | ICD-10-CM | POA: Insufficient documentation

## 2019-11-21 DIAGNOSIS — E669 Obesity, unspecified: Secondary | ICD-10-CM | POA: Diagnosis not present

## 2019-11-21 DIAGNOSIS — Z79899 Other long term (current) drug therapy: Secondary | ICD-10-CM | POA: Insufficient documentation

## 2019-11-21 NOTE — Progress Notes (Signed)
Primary Care Physician: [name], MD Primary Cardiologist: Dr Marlou Porch Primary Electrophysiologist: Dr Curt Bears Referring Physician: Dr Burlene Arnt is a 74 y.o. female with a history of hypertension, hyperlipidemia, paroxysmal atrial fibrillation, diabetes, and OSA on CPAP who presents for follow up in the [facility]. Patient recently underwent afib ablation with Dr Curt Bears on 05/29/19. She reports that she felt "great" for over two weeks. Unfortunately, she had episodes of heart racing and feeling "jittery inside." Her heart rates at home have been 100-120s. There were no triggering events that the patient could identify. She denies any significant alcohol use. She is compliant with her CPAP therapy. Patient is s/p DCCV 06/24/19 and she initially felt well but then had another episode of rapid heart rate in the 120's the next morning but this has slowed to a normal rate by the afternoon. Her flecainide was increased to 75 mg BID. Since then, her FitBit has shown HR in the normal range.   On follow up today, patient is s/p repeat afib ablation with a flutter ablation 10/29/19. Patient reports doing very well since her ablation. She has not had any heart racing or palpitations. She purchased and [employer] which has shown only SR. She denies CP, swallowing, or groin issues.  Today, she denies symptoms of palpitation, chest pain, shortness of breath, orthopnea, PND, lower extremity edema, dizziness, presyncope, syncope, snoring, daytime somnolence, bleeding, or neurologic sequela. The patient is tolerating medications without difficulties and is otherwise without complaint today.    Atrial Fibrillation Risk Factors:  she does have symptoms or diagnosis of sleep apnea. she is compliant with CPAP therapy. she does not have a history of rheumatic fever. she does not have a history of alcohol use. The patient does have a history of early familial atrial  fibrillation or other arrhythmias. Father had PPM.  she has a BMI of Body mass index is 37.98 kg/m.Marland Kitchen Filed Weights   11/21/19 0837  Weight: 97.3 kg    Family History  Problem Relation Age of Onset  . Heart failure Father   . Heart failure Mother      Atrial Fibrillation Management history:  Previous antiarrhythmic drugs: flecainide Previous cardioversions: 06/24/19 Previous ablations: 05/29/19, 10/29/19 CHADS2VASC score: 4 Anticoagulation history: Xarelto   Past Medical History:  Diagnosis Date  . Acoustic neuroma ([place]) followed by dr brown at [facility]   right side w/ chronic roaring noise  . Anticoagulant long-term use    xarelto  . Benign paroxysmal positional vertigo   . Endometrial polyp   . Frequency of urination   . Hyperlipidemia   . Hypertension   . OA (osteoarthritis)    left knee  . OSA on CPAP   . PAF (paroxysmal atrial fibrillation) [facility]) cardiologist-  dr Marlou Porch   dx 04/ 2017  . PMB (postmenopausal bleeding)   . TMJ (temporomandibular joint disorder)    left side-- wears guard  . Type 2 diabetes mellitus ([place])   . Wears contact lenses    Past Surgical History:  Procedure Laterality Date  . ATRIAL FIBRILLATION ABLATION  05/29/2019  . ATRIAL FIBRILLATION ABLATION N/A 05/29/2019   Procedure: ATRIAL FIBRILLATION ABLATION;  Surgeon: [name], MD;  Location: [place] [facility];  Service: Cardiovascular;  Laterality: N/A;  . ATRIAL FIBRILLATION ABLATION N/A 10/29/2019   Procedure: ATRIAL FIBRILLATION ABLATION;  Surgeon: [name], MD;  Location: [place] [facility];  Service: Cardiovascular;  Laterality: N/A;  .  CARDIOVERSION N/A 06/24/2019   Procedure: CARDIOVERSION;  Surgeon: Skeet Latch, MD;  Location: [place];  Service: Cardiovascular;  Laterality: N/A;  . CARPAL TUNNEL RELEASE Right 04/17/2007   w/ Excision ganglion cyst and Pulley Release right thumb  . DILATATION & CURETTAGE/HYSTEROSCOPY WITH MYOSURE N/A 01/12/2017    Procedure: DILATATION & CURETTAGE/HYSTEROSCOPY WITH MYOSURE;  Surgeon: Arvella Nigh, MD;  Location: [place];  Service: Gynecology;  Laterality: N/A;  . ESOPHAGOGASTRODUODENOSCOPY (EGD) WITH PROPOFOL N/A 09/16/2018   Procedure: ESOPHAGOGASTRODUODENOSCOPY (EGD) WITH PROPOFOL;  Surgeon: Ronald Lobo, MD;  Location: [place];  Service: Endoscopy;  Laterality: N/A;  . GIVENS CAPSULE STUDY N/A 09/16/2018   Procedure: GIVENS CAPSULE STUDY;  Surgeon: Ronald Lobo, MD;  Location: [place];  Service: Endoscopy;  Laterality: N/A;  . HAMMER TOE SURGERY Left 2010   left second toe  . IR RADIOLOGY PERIPHERAL GUIDED IV START  10/27/2019  . IR US GUIDE VASC ACCESS RIGHT  10/27/2019  . KNEE ARTHROSCOPY Right 2010  . KNEE SURGERY Left 1993  . LYMPH NODE BIOPSY N/A 03/01/2017   Procedure: SENTINEL LYMPH NODE BIOPSY;  Surgeon: Everitt Amber, MD;  Location: [facility];  Service: Gynecology;  Laterality: N/A;  . NASAL SINUS SURGERY  1985 and 1995  . ROBOTIC ASSISTED TOTAL HYSTERECTOMY WITH BILATERAL SALPINGO OOPHERECTOMY Bilateral 03/01/2017   Procedure: XI ROBOTIC ASSISTED TOTAL HYSTERECTOMY WITH BILATERAL SALPINGO OOPHORECTOMY;  Surgeon: Everitt Amber, MD;  Location: [facility];  Service: Gynecology;  Laterality: Bilateral;  . ROTATOR CUFF REPAIR Right 04/2004  . TOE SURGERY Right 2012   Great toe and second toe  . TOTAL ELBOW REPLACEMENT Right 2000  . TOTAL KNEE ARTHROPLASTY Right 01/12/2010  . TRANSTHORACIC ECHOCARDIOGRAM  03/28/2016   ef 60-65%/ mild AR and MR  . TUBAL LIGATION Bilateral 1978    Current Outpatient Medications  Medication Sig Dispense Refill  . acetaminophen (TYLENOL) 500 MG tablet Take 500-1,000 mg by mouth every 8 (eight) hours as needed for moderate pain or headache.     Marland Kitchen atorvastatin (LIPITOR) 40 MG tablet Take 40 mg by mouth every morning.     . betamethasone valerate (VALISONE) 0.1 % cream Apply 1 application topically 2 (two) times a week.     . cetirizine (ZYRTEC)  10 MG tablet Take 10 mg by mouth daily as needed for allergies.    . Cholecalciferol (VITAMIN D3) 5000 units TABS Take 5,000 Units by mouth daily.     . Cranberry-Vitamin C (CRANBERRY CONCENTRATE/VITAMINC PO) Take 1 tablet by mouth daily.     Marland Kitchen docusate sodium (COLACE) 100 MG capsule Take 100 mg by mouth daily.    . flecainide (TAMBOCOR) 100 MG tablet Take 1 tablet (100 mg total) by mouth 2 (two) times daily. 180 tablet 1  . hydrochlorothiazide (HYDRODIURIL) 12.5 MG tablet Take 12.5 mg by mouth daily.     Marland Kitchen ipratropium (ATROVENT) 0.03 % nasal spray Place 2 sprays into both nostrils every 12 (twelve) hours as needed for rhinitis.    Marland Kitchen JANUVIA 100 MG tablet Take 100 mg by mouth daily after supper.     . Lancets (ONETOUCH DELICA PLUS [id number]) [place]     . metoprolol succinate (TOPROL-XL) 50 MG 24 hr tablet Take 50 mg by mouth daily.    . Multiple Vitamin (MULTIVITAMIN) tablet Take 1 tablet by mouth daily.    Marland Kitchen olmesartan (BENICAR) 20 MG tablet Take 20 mg by mouth daily.    . ONE TOUCH ULTRA TEST test strip     .  Polyethyl Glycol-Propyl Glycol (SYSTANE OP) Place 1 drop into both eyes daily as needed (dry eyes).    . rivaroxaban (XARELTO) 20 MG TABS tablet TAKE ONE TABLET BY MOUTH ONE TIME DAILY WITH SUPPER (Patient taking differently: Take 20 mg by mouth daily with supper. ) 90 tablet 1  . saline (AYR) GEL Place 1 application into the nose at bedtime.     No current facility-administered medications for this encounter.    Allergies  Allergen Reactions  . Augmentin [Amoxicillin-Pot Clavulanate] Itching    severe    . Cephalosporins Itching    severe    Social History   Socioeconomic History  . Marital status: Married    Spouse name: Not on file  . Number of children: Not on file  . Years of education: Not on file  . Highest education level: Not on file  Occupational History  . Not on file  Tobacco Use  . Smoking status: Never Smoker  . Smokeless tobacco: Never Used  Substance  and Sexual Activity  . Alcohol use: No  . Drug use: No  . Sexual activity: Not on file  Other Topics Concern  . Not on file  Social History Narrative   Married w/2 children (boy and girl).  Retired.   Social Determinants of Health   Financial Resource Strain:   . Difficulty of Paying Living Expenses: Not on file  Food Insecurity:   . Worried About Charity fundraiser in the Last Year: Not on file  . Ran Out of Food in the Last Year: Not on file  Transportation Needs:   . Lack of Transportation (Medical): Not on file  . Lack of Transportation (Non-Medical): Not on file  Physical Activity:   . Days of Exercise per Week: Not on file  . Minutes of Exercise per Session: Not on file  Stress:   . Feeling of Stress : Not on file  Social Connections:   . Frequency of Communication with Friends and Family: Not on file  . Frequency of Social Gatherings with Friends and Family: Not on file  . Attends Religious Services: Not on file  . Active Member of Clubs or Organizations: Not on file  . Attends Archivist Meetings: Not on file  . Marital Status: Not on file  Intimate Partner Violence:   . Fear of Current or Ex-Partner: Not on file  . Emotionally Abused: Not on file  . Physically Abused: Not on file  . Sexually Abused: Not on file     ROS- All systems are reviewed and negative except as per the HPI above.  Physical Exam: Vitals:   11/21/19 0837  BP: 130/76  Pulse: 61  Weight: 97.3 kg  Height: 5\' 3"  (1.6 m)    GEN- The patient is well appearing obese female, alert and oriented x 3 today.   HEENT-head normocephalic, atraumatic, sclera clear, conjunctiva pink, hearing intact, trachea midline. Lungs- Clear to ausculation bilaterally, normal work of breathing Heart- Regular rate and rhythm, no murmurs, rubs or gallops  GI- soft, NT, ND, + BS Extremities- no clubbing, cyanosis, or edema MS- no significant deformity or atrophy Skin- no rash or lesion Psych- euthymic  mood, full affect Neuro- strength and sensation are intact   Wt Readings from Last 3 Encounters:  11/21/19 97.3 kg  10/29/19 97.1 kg  09/04/19 96.3 kg    EKG today demonstrates SR HR 61, 1st degree AV block, LAFB, PR 214, QRS 106, QTc 420  Echo 03/28/16 demonstrated  -  Left ventricle: The cavity size was normal. Systolic function was   normal. The estimated ejection fraction was in the range of 60%   to 65%. Wall motion was normal; there were no regional wall   motion abnormalities. The transmitral flow pattern was normal.   Left ventricular diastolic function parameters were normal. - Aortic valve: Moderate focal calcification, consistent with   sclerosis. There was mild regurgitation. - Mitral valve: There was mild regurgitation.   Epic records are reviewed at length today  Assessment and Plan:  1. Paroxysmal atrial fibrillation/atrial flutter S/p ablation with Dr Curt Bears 05/29/19. S/p DCCV 06/24/19. Now s/p afib and flutter ablation 10/29/19. Patient appears to be maintaining SR. Continue flecainide 100 mg BID. We discussed possibly decreasing this but she is doing well for now. Continue Toprol 50 mg daily Continue Xarelto 20 mg daily.  This patients CHA2DS2-VASc Score and unadjusted Ischemic Stroke Rate (% per year) is equal to 4.8 % stroke rate/year from a score of 4  Above score calculated as 1 point each if present [CHF, HTN, DM, Vascular=MI/PAD/Aortic Plaque, Age if 65-74, or Female] Above score calculated as 2 points each if present [Age > 75, or Stroke/TIA/TE]   2. Obesity Body mass index is 37.98 kg/m. Lifestyle modification was discussed and encouraged including regular physical activity and weight reduction.  3. Obstructive sleep apnea The importance of adequate treatment of sleep apnea was discussed today in order to improve our ability to maintain sinus rhythm long term. Patient compliant with CPAP therapy.  4. HTN Stable, no changes today.   Follow up  with Dr Marlou Porch and Dr Curt Bears as scheduled.   [facility] [address] [place], [place] [address] [phone] 11/21/2019 9:22 AM

## 2019-11-27 DIAGNOSIS — H52223 Regular astigmatism, bilateral: Secondary | ICD-10-CM | POA: Diagnosis not present

## 2019-11-29 ENCOUNTER — Ambulatory Visit: Payer: Medicare HMO

## 2019-11-30 ENCOUNTER — Ambulatory Visit: Payer: Medicare HMO | Attending: Internal Medicine

## 2019-11-30 DIAGNOSIS — Z23 Encounter for immunization: Secondary | ICD-10-CM

## 2019-11-30 NOTE — Progress Notes (Signed)
   Covid-19 Vaccination Clinic  Name:  Erika Kim    MRN: [id number] DOB: 14-Apr-1946  11/30/2019  Erika Kim was observed post Covid-19 immunization for 15 minutes without incidence. She was provided with Vaccine Information Sheet and instruction to access the V-Safe system.   Erika Kim was instructed to call 911 with any severe reactions post vaccine: Marland Kitchen Difficulty breathing  . Swelling of your face and throat  . A fast heartbeat  . A bad rash all over your body  . Dizziness and weakness    Immunizations Administered    Name Date Dose VIS Date Route   Pfizer COVID-19 Vaccine 11/30/2019  9:19 AM 0.3 mL 10/03/2019 Intramuscular   Manufacturer: [place]   Lot: [id number]   Miguel Barrera: [id number]

## 2019-12-03 DIAGNOSIS — M545 Low back pain: Secondary | ICD-10-CM | POA: Diagnosis not present

## 2019-12-03 DIAGNOSIS — M1712 Unilateral primary osteoarthritis, left knee: Secondary | ICD-10-CM | POA: Diagnosis not present

## 2019-12-04 ENCOUNTER — Telehealth: Payer: Self-pay | Admitting: *Deleted

## 2019-12-04 NOTE — Telephone Encounter (Signed)
Patient with diagnosis of ATRIAL FIBRILLATION on [place] for anticoagulation.    Procedure: L TKA Date of procedure: TBD  CHADS2-VASc score of  4 (HTN, AGE, DM2, female)  CrCl > 100ML/MIN  Per office protocol, patient can hold XARELTO for 3 days prior to procedure.

## 2019-12-04 NOTE — Telephone Encounter (Signed)
Anesthesia is SPINAL, per staff message from [facility].

## 2019-12-04 NOTE — Telephone Encounter (Signed)
   [facility] Pre-operative Risk Assessment    Request for surgical clearance:  1. What type of surgery is being performed?  LEFT TOTAL KNEE REPLACEMENT   2. When is this surgery scheduled?  TBD PENDING CLEARANC   3. What type of clearance is required (medical clearance vs. Pharmacy clearance to hold med vs. Both)?  BOTH  4. Are there any medications that need to be held prior to surgery and how long? [place]   5. Practice name and name of physician performing surgery?  [facility] / DR. Percell Miller   6. What is your office phone number [phone]    7.   What is your office fax number [phone]  8.   Anesthesia type (None, local, MAC, general) ?  Left Claiborne Billings, surgery scheduler coordinator, a message to call back with anesthesia type.   Jeanann Lewandowsky 12/04/2019, 11:03 AM  _________________________________________________________________   (provider comments below)

## 2019-12-05 NOTE — Telephone Encounter (Signed)
   Primary Cardiologist: [name], MD  Chart reviewed as part of pre-operative protocol coverage. Given past medical history and time since last visit, based on ACC/AHA guidelines, MARLISSA ARKWRIGHT would be at acceptable risk for the planned procedure without further cardiovascular testing.   Her RCRI is a class I risk, 0.4% risk of major cardiac event.  Patient with diagnosis of ATRIAL FIBRILLATION on [place] for anticoagulation.   Procedure: L TKA   Date of procedure: TBD   CHADS2-VASc score of 4 (HTN, AGE, DM2, female)   CrCl > 100ML/MIN   Per office protocol, patient can hold XARELTO for 3 days prior to procedure.   I will route this recommendation to the requesting party via Epic fax function and remove from pre-op pool.  Please call with questions.  Jossie Ng. [facility] [place] [address] Office [phone] Fax [phone]

## 2019-12-23 ENCOUNTER — Ambulatory Visit: Payer: Medicare HMO | Attending: Internal Medicine

## 2019-12-23 DIAGNOSIS — Z23 Encounter for immunization: Secondary | ICD-10-CM | POA: Insufficient documentation

## 2019-12-23 NOTE — Progress Notes (Signed)
   Covid-19 Vaccination Clinic  Name:  Erika Kim    MRN: [id number] DOB: 12-Jun-1946  12/23/2019  [name] was observed post Covid-19 immunization for 15 minutes without incident. She was provided with Vaccine Information Sheet and instruction to access the V-Safe system.   [name] was instructed to call 911 with any severe reactions post vaccine: Marland Kitchen Difficulty breathing  . Swelling of face and throat  . A fast heartbeat  . A bad rash all over body  . Dizziness and weakness   Immunizations Administered    Name Date Dose VIS Date Route   Pfizer COVID-19 Vaccine 12/23/2019  8:31 AM 0.3 mL 10/03/2019 Intramuscular   Manufacturer: [place]   Lot: [id number]   Filer: [id number]

## 2019-12-31 DIAGNOSIS — G4733 Obstructive sleep apnea (adult) (pediatric): Secondary | ICD-10-CM | POA: Diagnosis not present

## 2020-01-03 DIAGNOSIS — R69 Illness, unspecified: Secondary | ICD-10-CM | POA: Diagnosis not present

## 2020-01-15 ENCOUNTER — Encounter: Payer: Self-pay | Admitting: *Deleted

## 2020-01-15 ENCOUNTER — Encounter: Payer: Self-pay | Admitting: Cardiology

## 2020-01-15 ENCOUNTER — Other Ambulatory Visit: Payer: Self-pay

## 2020-01-15 ENCOUNTER — Ambulatory Visit: Payer: Medicare HMO | Admitting: Cardiology

## 2020-01-15 VITALS — BP 110/62 | HR 64 | Resp 15 | Ht 63.0 in | Wt 212.4 lb

## 2020-01-15 DIAGNOSIS — G4733 Obstructive sleep apnea (adult) (pediatric): Secondary | ICD-10-CM

## 2020-01-15 DIAGNOSIS — I480 Paroxysmal atrial fibrillation: Secondary | ICD-10-CM | POA: Diagnosis not present

## 2020-01-15 MED ORDER — FLECAINIDE ACETATE 50 MG PO TABS: 50.0000 mg | ORAL_TABLET | Freq: Two times a day (BID) | ORAL | 3 refills | Status: DC

## 2020-01-15 NOTE — Progress Notes (Signed)
Cardiology Office Note:    Date:  01/15/2020   ID:  Erika Kim, DOB 06-03-1946, MRN [id number]  PCP:  [name], MD  Cardiologist:  [name], MD  Electrophysiologist:  [name], MD   Referring MD: [name], MD     History of Present Illness:    Erika Kim is a 74 y.o. female with here for the follow-up of paroxysmal atrial fibrillation.  Previously seen in atrial fibrillation clinic, ablation Dr. Curt Bears August 2020.  Felt great for about 2 weeks and then had episode of jitters.  Heart rates were in the 120s.  She went for cardioversion in September 2020 and then had another episode.  Flecainide was then increased to 75 twice daily.  Overall doing well.  Fit bit showed sinus rhythm.  HR not greater than 100 during exercise.   Past Medical History:  Diagnosis Date  . Acoustic neuroma ([place]) followed by dr brown at [facility]   right side w/ chronic roaring noise  . Anticoagulant long-term use    xarelto  . Benign paroxysmal positional vertigo   . Endometrial polyp   . Frequency of urination   . Hyperlipidemia   . Hypertension   . OA (osteoarthritis)    left knee  . OSA on CPAP   . PAF (paroxysmal atrial fibrillation) [facility]) cardiologist-  dr Marlou Porch   dx 04/ 2017  . PMB (postmenopausal bleeding)   . TMJ (temporomandibular joint disorder)    left side-- wears guard  . Type 2 diabetes mellitus ([place])   . Wears contact lenses     Past Surgical History:  Procedure Laterality Date  . ATRIAL FIBRILLATION ABLATION  05/29/2019  . ATRIAL FIBRILLATION ABLATION N/A 05/29/2019   Procedure: ATRIAL FIBRILLATION ABLATION;  Surgeon: [name], MD;  Location: [place] [facility];  Service: Cardiovascular;  Laterality: N/A;  . ATRIAL FIBRILLATION ABLATION N/A 10/29/2019   Procedure: ATRIAL FIBRILLATION ABLATION;  Surgeon: [name], MD;  Location: [place] [facility];  Service: Cardiovascular;  Laterality: N/A;  . CARDIOVERSION N/A  06/24/2019   Procedure: CARDIOVERSION;  Surgeon: Skeet Latch, MD;  Location: [place];  Service: Cardiovascular;  Laterality: N/A;  . CARPAL TUNNEL RELEASE Right 04/17/2007   w/ Excision ganglion cyst and Pulley Release right thumb  . DILATATION & CURETTAGE/HYSTEROSCOPY WITH MYOSURE N/A 01/12/2017   Procedure: DILATATION & CURETTAGE/HYSTEROSCOPY WITH MYOSURE;  Surgeon: Arvella Nigh, MD;  Location: [place];  Service: Gynecology;  Laterality: N/A;  . ESOPHAGOGASTRODUODENOSCOPY (EGD) WITH PROPOFOL N/A 09/16/2018   Procedure: ESOPHAGOGASTRODUODENOSCOPY (EGD) WITH PROPOFOL;  Surgeon: Ronald Lobo, MD;  Location: [place];  Service: Endoscopy;  Laterality: N/A;  . GIVENS CAPSULE STUDY N/A 09/16/2018   Procedure: GIVENS CAPSULE STUDY;  Surgeon: Ronald Lobo, MD;  Location: [place];  Service: Endoscopy;  Laterality: N/A;  . HAMMER TOE SURGERY Left 2010   left second toe  . IR RADIOLOGY PERIPHERAL GUIDED IV START  10/27/2019  . IR US GUIDE VASC ACCESS RIGHT  10/27/2019  . KNEE ARTHROSCOPY Right 2010  . KNEE SURGERY Left 1993  . LYMPH NODE BIOPSY N/A 03/01/2017   Procedure: SENTINEL LYMPH NODE BIOPSY;  Surgeon: Everitt Amber, MD;  [facility];  Service: Gynecology;  Laterality: N/A;  . NASAL SINUS SURGERY  1985 and 1995  . ROBOTIC ASSISTED TOTAL HYSTERECTOMY WITH BILATERAL SALPINGO OOPHERECTOMY Bilateral 03/01/2017   Procedure: XI ROBOTIC ASSISTED TOTAL HYSTERECTOMY WITH BILATERAL SALPINGO OOPHORECTOMY;  Surgeon: Everitt Amber, MD;  Location: [facility];  Service: Gynecology;  Laterality: Bilateral;  . ROTATOR CUFF REPAIR Right 04/2004  . TOE SURGERY Right 2012   Great toe and second toe  . TOTAL ELBOW REPLACEMENT Right 2000  . TOTAL KNEE ARTHROPLASTY Right 01/12/2010  . TRANSTHORACIC ECHOCARDIOGRAM  03/28/2016   ef 60-65%/ mild AR and MR  . TUBAL LIGATION Bilateral 1978    Current Medications: Current Meds  Medication Sig  . acetaminophen (TYLENOL) 500 MG  tablet Take 500-1,000 mg by mouth every 8 (eight) hours as needed for moderate pain or headache.   Marland Kitchen atorvastatin (LIPITOR) 40 MG tablet Take 40 mg by mouth every morning.   . betamethasone valerate (VALISONE) 0.1 % cream Apply 1 application topically 2 (two) times a week.   . cetirizine (ZYRTEC) 10 MG tablet Take 10 mg by mouth daily as needed for allergies.  . Cholecalciferol (VITAMIN D3) 5000 units TABS Take 5,000 Units by mouth daily.   . Cranberry-Vitamin C (CRANBERRY CONCENTRATE/VITAMINC PO) Take 1 tablet by mouth daily.   Marland Kitchen docusate sodium (COLACE) 100 MG capsule Take 100 mg by mouth daily.  . hydrochlorothiazide (HYDRODIURIL) 12.5 MG tablet Take 12.5 mg by mouth daily.   Marland Kitchen ipratropium (ATROVENT) 0.03 % nasal spray Place 2 sprays into both nostrils every 12 (twelve) hours as needed for rhinitis.  Marland Kitchen JANUVIA 100 MG tablet Take 100 mg by mouth daily after supper.   . Lancets (ONETOUCH DELICA PLUS 123XX123) [place]   . metoprolol succinate (TOPROL-XL) 50 MG 24 hr tablet Take 50 mg by mouth daily.  . Multiple Vitamin (MULTIVITAMIN) tablet Take 1 tablet by mouth daily.  Marland Kitchen olmesartan (BENICAR) 20 MG tablet Take 20 mg by mouth daily.  . ONE TOUCH ULTRA TEST test strip   . Polyethyl Glycol-Propyl Glycol (SYSTANE OP) Place 1 drop into both eyes daily as needed (dry eyes).  . rivaroxaban (XARELTO) 20 MG TABS tablet TAKE ONE TABLET BY MOUTH ONE TIME DAILY WITH SUPPER (Patient taking differently: Take 20 mg by mouth daily with supper. )  . saline (AYR) GEL Place 1 application into the nose at bedtime.  . [DISCONTINUED] flecainide (TAMBOCOR) 100 MG tablet Take 1 tablet (100 mg total) by mouth 2 (two) times daily.     Allergies:   Augmentin [amoxicillin-pot clavulanate] and Cephalosporins   Social History   Socioeconomic History  . Marital status: Married    Spouse name: Not on file  . Number of children: Not on file  . Years of education: Not on file  . Highest education level: Not on file    Occupational History  . Not on file  Tobacco Use  . Smoking status: Never Smoker  . Smokeless tobacco: Never Used  Substance and Sexual Activity  . Alcohol use: No  . Drug use: No  . Sexual activity: Not on file  Other Topics Concern  . Not on file  Social History Narrative   Married w/2 children (boy and girl).  Retired.   Social Determinants of Health   Financial Resource Strain:   . Difficulty of Paying Living Expenses:   Food Insecurity:   . Worried About Charity fundraiser in the Last Year:   . Arboriculturist in the Last Year:   Transportation Needs:   . Film/video editor (Medical):   Marland Kitchen Lack of Transportation (Non-Medical):   Physical Activity:   . Days of Exercise per Week:   . Minutes of Exercise per Session:   Stress:   . Feeling of  Stress :   Social Connections:   . Frequency of Communication with Friends and Family:   . Frequency of Social Gatherings with Friends and Family:   . Attends Religious Services:   . Active Member of Clubs or Organizations:   . Attends Archivist Meetings:   Marland Kitchen Marital Status:      Family History: The patient's family history includes Heart failure in her father and mother.  ROS:   Please see the history of present illness.    No fevers chills nausea vomiting syncope bleeding all other systems reviewed and are negative.  EKGs/Labs/Other Studies Reviewed:    The following studies were reviewed today: Echo EF 65% mild aortic valve regurgitation and mild mitral regurgitation.  EKG:  EKG is not ordered today.  Sinus rhythm with first-degree AV block on 11/21/2019.  Recent Labs: 10/22/2019: BUN 13; Creatinine, Ser 0.69; Hemoglobin 14.7; Platelets 159; Potassium 4.2; Sodium 140  Recent Lipid Panel No results found for: CHOL, TRIG, HDL, CHOLHDL, VLDL, LDLCALC, LDLDIRECT  Physical Exam:    VS:  BP 110/62   Pulse 64   Resp 15   Ht 5\' 3"  (1.6 m)   Wt 212 lb 6.4 oz (96.3 kg)   SpO2 96%   BMI 37.62 kg/m      Wt Readings from Last 3 Encounters:  01/15/20 212 lb 6.4 oz (96.3 kg)  11/21/19 214 lb 6.4 oz (97.3 kg)  10/29/19 214 lb (97.1 kg)     GEN:  Well nourished, well developed in no acute distress HEENT: Normal NECK: No JVD; No carotid bruits LYMPHATICS: No lymphadenopathy CARDIAC: RRR, no murmurs, rubs, gallops RESPIRATORY:  Clear to auscultation without rales, wheezing or rhonchi  ABDOMEN: Soft, non-tender, non-distended MUSCULOSKELETAL:  No edema; No deformity  SKIN: Warm and dry NEUROLOGIC:  Alert and oriented x 3 PSYCHIATRIC:  Normal affect   ASSESSMENT:    1. Paroxysmal atrial fibrillation (HCC)   2. Morbid obesity ([place])   3. OSA (obstructive sleep apnea)    PLAN:    In order of problems listed above:  Pre-op knee  - ok to proceed surgery with low overall cardiac risk at this point.  No further testing required.  She would need to hold her Xarelto for 2 days prior to surgery.  Creatinine 0.69.  Resume Xarelto when able postop.  She sees Dr. Fredonia Highland  Paroxysmal atrial fibrillation/flutter -Ablation in August 2020 -Cardioversion September 2020 after recurrence -Increased flecainide to 100twice daily now with stability. -Post A. fib and flutter ablation x2 10/29/2019. -Also on Toprol and Xarelto. -- Hgb 14.7 (once at home test + for blood, Dr. Geri Seminole reviewing) -She is interested in pulling back off the flecainide.  For right now we will go back to 50 mg twice a day.  At next visit or visit with Dr. Curt Bears, we may be able to discontinue if she is holding stable.  Chronic anticoagulation -CHA2DS2-VASc score 4  Morbid obesity -Continue to encourage weight loss.  Lifestyle modification.  BMI greater than 35 with 2 or more comorbidities.  Obstructive sleep apnea -Compliant with CPAP.  Essential hypertension -Stable.  Medication Adjustments/Labs and Tests Ordered: Current medicines are reviewed at length with the patient today.  Concerns regarding medicines are  outlined above.  No orders of the defined types were placed in this encounter.  Meds ordered this encounter  Medications  . flecainide (TAMBOCOR) 50 MG tablet    Sig: Take 1 tablet (50 mg total) by mouth 2 (two) times  daily.    Dispense:  180 tablet    Refill:  3    Please dc any other rx for flecainide    Patient Instructions  Medication Instructions:  Please decrease Flecainide to 50 mg twice a day.  Continue all other medications as listed.  *If you need a refill on your cardiac medications before your next appointment, please call your pharmacy*  Follow-Up: At [facility], you and your health needs are our priority.  As part of our continuing mission to provide you with exceptional heart care, we have created designated Provider Care Teams.  These Care Teams include your primary Cardiologist (physician) and Advanced Practice Providers (APPs -  Physician Assistants and Nurse Practitioners) who all work together to provide you with the care you need, when you need it.  We recommend signing up for the patient portal called "MyChart".  Sign up information is provided on this After Visit Summary.  MyChart is used to connect with patients for Virtual Visits (Telemedicine).  Patients are able to view lab/test results, encounter notes, upcoming appointments, etc.  Non-urgent messages can be sent to your provider as well.   To learn more about what you can do with MyChart, go to [email or web address].    Your next appointment:   6 month(s)  The format for your next appointment:   In Person  Provider:   [name], MD   Thank you for choosing [facility]!!        Signed, [name], MD  01/15/2020 8:49 AM    [place]

## 2020-01-15 NOTE — Patient Instructions (Signed)
Medication Instructions:  Please decrease Flecainide to 50 mg twice a day.  Continue all other medications as listed.  *If you need a refill on your cardiac medications before your next appointment, please call your pharmacy*  Follow-Up: At [facility], you and your health needs are our priority.  As part of our continuing mission to provide you with exceptional heart care, we have created designated Provider Care Teams.  These Care Teams include your primary Cardiologist (physician) and Advanced Practice Providers (APPs -  Physician Assistants and Nurse Practitioners) who all work together to provide you with the care you need, when you need it.  We recommend signing up for the patient portal called "MyChart".  Sign up information is provided on this After Visit Summary.  MyChart is used to connect with patients for Virtual Visits (Telemedicine).  Patients are able to view lab/test results, encounter notes, upcoming appointments, etc.  Non-urgent messages can be sent to your provider as well.   To learn more about what you can do with MyChart, go to [email or web address].    Your next appointment:   6 month(s)  The format for your next appointment:   In Person  Provider:   [name], MD   Thank you for choosing [facility]!!

## 2020-01-17 ENCOUNTER — Other Ambulatory Visit: Payer: Self-pay | Admitting: Cardiology

## 2020-01-19 DIAGNOSIS — R69 Illness, unspecified: Secondary | ICD-10-CM | POA: Diagnosis not present

## 2020-01-19 NOTE — Telephone Encounter (Signed)
Prescription refill request for Xarelto received.   Last office visit: [place], 01/15/2020 Weight: 96.3 kg  Age: 74 y.o. Scr: 0.69 10/22/2019 CrCl: 112.04 ml/min   Prescription refill sent.

## 2020-01-27 ENCOUNTER — Other Ambulatory Visit: Payer: Self-pay

## 2020-01-27 ENCOUNTER — Encounter: Payer: Self-pay | Admitting: Cardiology

## 2020-01-27 ENCOUNTER — Ambulatory Visit: Payer: Medicare HMO | Admitting: Cardiology

## 2020-01-27 VITALS — BP 128/72 | HR 66 | Ht 63.0 in | Wt 210.0 lb

## 2020-01-27 DIAGNOSIS — I480 Paroxysmal atrial fibrillation: Secondary | ICD-10-CM

## 2020-01-27 NOTE — Progress Notes (Signed)
Electrophysiology Office Note   Date:  01/27/2020   ID:  Neeharika, Klemme [birth year], MRN [id number]  PCP:  [name], MD  Cardiologist:  Marlou Porch Primary Electrophysiologist:  Lateria Alderman Meredith Leeds, MD    No chief complaint on file.    History of Present Illness: Erika Kim is a 74 y.o. female who is being seen today for the evaluation of atrial fibrillation at the request of [name]. Presenting today for electrophysiology evaluation.  She has a history of hypertension, hyperlipidemia, paroxysmal atrial fibrillation, diabetes, and OSA on CPAP.  She has been having occasional episodes of atrial fibrillation that is increased despite starting flecainide.  She has had been having side effects to flecainide.  She was recently admitted to the hospital with symptomatic anemia and melanotic stools.  Her Xarelto was stopped.  She had a capsule endoscopy which showed oozing blood from multiple low-grade lesions.  She is now status post AF ablation 05/29/2019.  Post AF ablation, she returned with atrial flutter and required cardioversion.  She is now status post repeat AF ablation 10/29/2019.  Today, denies symptoms of palpitations, chest pain, shortness of breath, orthopnea, PND, lower extremity edema, claudication, dizziness, presyncope, syncope, bleeding, or neurologic sequela. The patient is tolerating medications without difficulties.  She is overall doing well.  She has had no further episodes of atrial fibrillation since her ablation.  She has been going to the gym, and not having any issues with exercise.  She has gotten her coronavirus vaccination.   Past Medical History:  Diagnosis Date  . Acoustic neuroma ([place]) followed by dr brown at [facility]   right side w/ chronic roaring noise  . Anticoagulant long-term use    xarelto  . Benign paroxysmal positional vertigo   . Endometrial polyp   . Frequency of urination   . Hyperlipidemia   . Hypertension   . OA (osteoarthritis)    left knee  . OSA on CPAP   . PAF (paroxysmal atrial fibrillation) [facility]) cardiologist-  dr Marlou Porch   dx 04/ 2017  . PMB (postmenopausal bleeding)   . TMJ (temporomandibular joint disorder)    left side-- wears guard  . Type 2 diabetes mellitus ([place])   . Wears contact lenses    Past Surgical History:  Procedure Laterality Date  . ATRIAL FIBRILLATION ABLATION  05/29/2019  . ATRIAL FIBRILLATION ABLATION N/A 05/29/2019   Procedure: ATRIAL FIBRILLATION ABLATION;  Surgeon: [name], MD;  Location: [place] [facility];  Service: Cardiovascular;  Laterality: N/A;  . ATRIAL FIBRILLATION ABLATION N/A 10/29/2019   Procedure: ATRIAL FIBRILLATION ABLATION;  Surgeon: [name], MD;  Location: [place] [facility];  Service: Cardiovascular;  Laterality: N/A;  . CARDIOVERSION N/A 06/24/2019   Procedure: CARDIOVERSION;  Surgeon: Skeet Latch, MD;  Location: [place];  Service: Cardiovascular;  Laterality: N/A;  . CARPAL TUNNEL RELEASE Right 04/17/2007   w/ Excision ganglion cyst and Pulley Release right thumb  . DILATATION & CURETTAGE/HYSTEROSCOPY WITH MYOSURE N/A 01/12/2017   Procedure: DILATATION & CURETTAGE/HYSTEROSCOPY WITH MYOSURE;  Surgeon: Arvella Nigh, MD;  Location: [place];  Service: Gynecology;  Laterality: N/A;  . ESOPHAGOGASTRODUODENOSCOPY (EGD) WITH PROPOFOL N/A 09/16/2018   Procedure: ESOPHAGOGASTRODUODENOSCOPY (EGD) WITH PROPOFOL;  Surgeon: Ronald Lobo, MD;  Location: [place];  Service: Endoscopy;  Laterality: N/A;  . GIVENS CAPSULE STUDY N/A 09/16/2018   Procedure: GIVENS CAPSULE STUDY;  Surgeon: Ronald Lobo, MD;  Location: [place];  Service: Endoscopy;  Laterality: N/A;  .  HAMMER TOE SURGERY Left 2010   left second toe  . IR RADIOLOGY PERIPHERAL GUIDED IV START  10/27/2019  . IR US GUIDE VASC ACCESS RIGHT  10/27/2019  . KNEE ARTHROSCOPY Right 2010  . KNEE SURGERY Left 1993  . LYMPH NODE BIOPSY N/A 03/01/2017   Procedure: SENTINEL  LYMPH NODE BIOPSY;  Surgeon: Everitt Amber, MD;  [facility];  Service: Gynecology;  Laterality: N/A;  . NASAL SINUS SURGERY  1985 and 1995  . ROBOTIC ASSISTED TOTAL HYSTERECTOMY WITH BILATERAL SALPINGO OOPHERECTOMY Bilateral 03/01/2017   Procedure: XI ROBOTIC ASSISTED TOTAL HYSTERECTOMY WITH BILATERAL SALPINGO OOPHORECTOMY;  Surgeon: Everitt Amber, MD;  [facility];  Service: Gynecology;  Laterality: Bilateral;  . ROTATOR CUFF REPAIR Right 04/2004  . TOE SURGERY Right 2012   Great toe and second toe  . TOTAL ELBOW REPLACEMENT Right 2000  . TOTAL KNEE ARTHROPLASTY Right 01/12/2010  . TRANSTHORACIC ECHOCARDIOGRAM  03/28/2016   ef 60-65%/ mild AR and MR  . TUBAL LIGATION Bilateral 1978     Current Outpatient Medications  Medication Sig Dispense Refill  . acetaminophen (TYLENOL) 500 MG tablet Take 500-1,000 mg by mouth every 8 (eight) hours as needed for moderate pain or headache.     Marland Kitchen atorvastatin (LIPITOR) 40 MG tablet Take 40 mg by mouth every morning.     . betamethasone valerate (VALISONE) 0.1 % cream Apply 1 application topically 2 (two) times a week.     . cetirizine (ZYRTEC) 10 MG tablet Take 10 mg by mouth daily as needed for allergies.    . Cholecalciferol (VITAMIN D3) 5000 units TABS Take 5,000 Units by mouth daily.     . Cranberry-Vitamin C (CRANBERRY CONCENTRATE/VITAMINC PO) Take 1 tablet by mouth daily.     Marland Kitchen docusate sodium (COLACE) 100 MG capsule Take 100 mg by mouth daily.    . flecainide (TAMBOCOR) 50 MG tablet Take 1 tablet (50 mg total) by mouth 2 (two) times daily. 180 tablet 3  . hydrochlorothiazide (HYDRODIURIL) 12.5 MG tablet Take 12.5 mg by mouth daily.     Marland Kitchen ipratropium (ATROVENT) 0.03 % nasal spray Place 2 sprays into both nostrils every 12 (twelve) hours as needed for rhinitis.    Marland Kitchen JANUVIA 100 MG tablet Take 100 mg by mouth daily after supper.     . Lancets (ONETOUCH DELICA PLUS 123XX123) [place]     . metoprolol succinate (TOPROL-XL) 50 MG 24 hr tablet Take  50 mg by mouth daily.    . Multiple Vitamin (MULTIVITAMIN) tablet Take 1 tablet by mouth daily.    Marland Kitchen olmesartan (BENICAR) 20 MG tablet Take 20 mg by mouth daily.    . ONE TOUCH ULTRA TEST test strip     . Polyethyl Glycol-Propyl Glycol (SYSTANE OP) Place 1 drop into both eyes daily as needed (dry eyes).    . saline (AYR) GEL Place 1 application into the nose at bedtime.    Alveda Reasons 20 MG TABS tablet TAKE ONE TABLET BY MOUTH ONE TIME DAILY with supper 90 tablet 1   No current facility-administered medications for this visit.    Allergies:   Augmentin [amoxicillin-pot clavulanate] and Cephalosporins   Social History:  The patient  reports that she has never smoked. She has never used smokeless tobacco. She reports that she does not drink alcohol or use drugs.   Family History:  The patient's family history includes Heart failure in her father and mother.   ROS:  Please see the history of present illness.  Otherwise, review of systems is positive for none.   All other systems are reviewed and negative.   PHYSICAL EXAM: VS:  BP 128/72   Pulse 66   Ht 5\' 3"  (1.6 m)   Wt 210 lb (95.3 kg)   SpO2 99%   BMI 37.20 kg/m  , BMI Body mass index is 37.2 kg/m. GEN: Well nourished, well developed, in no acute distress  HEENT: normal  Neck: no JVD, carotid bruits, or masses Cardiac: RRR; no murmurs, rubs, or gallops,no edema  Respiratory:  clear to auscultation bilaterally, normal work of breathing GI: soft, nontender, nondistended, + BS MS: no deformity or atrophy  Skin: warm and dry Neuro:  Strength and sensation are intact Psych: euthymic mood, full affect  EKG:  EKG is ordered today. Personal review of the ekg ordered shows sinus rhythm, rate 66  Recent Labs: 10/22/2019: BUN 13; Creatinine, Ser 0.69; Hemoglobin 14.7; Platelets 159; Potassium 4.2; Sodium 140    Lipid Panel  No results found for: CHOL, TRIG, HDL, CHOLHDL, VLDL, LDLCALC, LDLDIRECT   Wt Readings from Last 3  Encounters:  01/27/20 210 lb (95.3 kg)  01/15/20 212 lb 6.4 oz (96.3 kg)  11/21/19 214 lb 6.4 oz (97.3 kg)      Other studies Reviewed: Additional studies/ records that were reviewed today include: TTE 2017  Review of the above records today demonstrates:  - Left ventricle: The cavity size was normal. Systolic function was   normal. The estimated ejection fraction was in the range of 60%   to 65%. Wall motion was normal; there were no regional wall   motion abnormalities. The transmitral flow pattern was normal.   Left ventricular diastolic function parameters were normal. - Aortic valve: Moderate focal calcification, consistent with   sclerosis. There was mild regurgitation. - Mitral valve: There was mild regurgitation.   ASSESSMENT AND PLAN:  1.  Paroxysmal atrial fibrillation/flutter: Currently on Xarelto and flecainide.  Status post repeat AF ablation 10/29/2019.  CHA2DS2-VASc of 4.  He is remained in sinus rhythm since her ablation.  Due to that, we Ellsworth Waldschmidt stop her flecainide.  I have also asked her to take her Toprol-XL in the evening.   2.  Obstructive sleep apnea: CPAP compliance encouraged  3.  Hypertension: Well-controlled  4.  Hyperlipidemia: Continue atorvastatin  5.  Morbid obesity: Diet and exercise encouraged    Current medicines are reviewed at length with the patient today.   The patient does not have concerns regarding her medicines.  The following changes were made today: Stop flecainide  Labs/ tests ordered today include:  Orders Placed This Encounter  Procedures  . EKG 12-Lead     Disposition:   FU with Wesly Whisenant 6 months  Signed, Myrth Dahan Meredith Leeds, MD  01/27/2020 9:50 AM     [facility]: [facility] [address] [place] [address] [phone] (office) [phone] (fax)

## 2020-01-27 NOTE — Patient Instructions (Signed)
Medication Instructions:  Your physician has recommended you make the following change in your medication:  1. STOP Flecainide  *If you need a refill on your cardiac medications before your next appointment, please call your pharmacy*   Lab Work: None ordered If you have labs (blood work) drawn today and your tests are completely normal, you will receive your results only by: Marland Kitchen MyChart Message (if you have MyChart) OR . A paper copy in the mail If you have any lab test that is abnormal or we need to change your treatment, we will call you to review the results.   Testing/Procedures: None ordered   Follow-Up: At [facility], you and your health needs are our priority.  As part of our continuing mission to provide you with exceptional heart care, we have created designated Provider Care Teams.  These Care Teams include your primary Cardiologist (physician) and Advanced Practice Providers (APPs -  Physician Assistants and Nurse Practitioners) who all work together to provide you with the care you need, when you need it.  We recommend signing up for the patient portal called "MyChart".  Sign up information is provided on this After Visit Summary.  MyChart is used to connect with patients for Virtual Visits (Telemedicine).  Patients are able to view lab/test results, encounter notes, upcoming appointments, etc.  Non-urgent messages can be sent to your provider as well.   To learn more about what you can do with MyChart, go to [email or web address].    Your next appointment:   6 month(s)  The format for your next appointment:   In Person  Provider:   Allegra Lai, MD   Thank you for choosing [facility]!!   [name], RN [phone]    Other Instructions

## 2020-01-29 DIAGNOSIS — M25562 Pain in left knee: Secondary | ICD-10-CM | POA: Diagnosis not present

## 2020-01-29 DIAGNOSIS — I480 Paroxysmal atrial fibrillation: Secondary | ICD-10-CM | POA: Diagnosis not present

## 2020-01-29 DIAGNOSIS — M5416 Radiculopathy, lumbar region: Secondary | ICD-10-CM | POA: Diagnosis not present

## 2020-01-29 DIAGNOSIS — I739 Peripheral vascular disease, unspecified: Secondary | ICD-10-CM | POA: Diagnosis not present

## 2020-01-29 DIAGNOSIS — D62 Acute posthemorrhagic anemia: Secondary | ICD-10-CM | POA: Diagnosis not present

## 2020-01-29 DIAGNOSIS — E7849 Other hyperlipidemia: Secondary | ICD-10-CM | POA: Diagnosis not present

## 2020-01-29 DIAGNOSIS — E1151 Type 2 diabetes mellitus with diabetic peripheral angiopathy without gangrene: Secondary | ICD-10-CM | POA: Diagnosis not present

## 2020-01-29 DIAGNOSIS — G4733 Obstructive sleep apnea (adult) (pediatric): Secondary | ICD-10-CM | POA: Diagnosis not present

## 2020-01-29 DIAGNOSIS — I10 Essential (primary) hypertension: Secondary | ICD-10-CM | POA: Diagnosis not present

## 2020-02-02 DIAGNOSIS — D333 Benign neoplasm of cranial nerves: Secondary | ICD-10-CM | POA: Diagnosis not present

## 2020-02-02 DIAGNOSIS — H9193 Unspecified hearing loss, bilateral: Secondary | ICD-10-CM | POA: Diagnosis not present

## 2020-02-02 DIAGNOSIS — H9042 Sensorineural hearing loss, unilateral, left ear, with unrestricted hearing on the contralateral side: Secondary | ICD-10-CM | POA: Diagnosis not present

## 2020-02-03 DIAGNOSIS — R69 Illness, unspecified: Secondary | ICD-10-CM | POA: Diagnosis not present

## 2020-02-06 DIAGNOSIS — M1712 Unilateral primary osteoarthritis, left knee: Secondary | ICD-10-CM | POA: Diagnosis not present

## 2020-02-06 NOTE — H&P (Signed)
KNEE ARTHROPLASTY ADMISSION H&P  Patient ID: Erika Kim MRN: [id number] DOB/AGE: 03-19-1946 74 y.o.  Chief Complaint: left knee pain.  Planned Procedure Date: 03/02/20 Medical Clearance by Dr. Sharlett Iles   Cardiac Clearance by Dr. Curt Bears  HPI: Erika Kim is a 74 y.o. female with a history of PAF s/p ablation on Xarelto, DM, HLD, HTN, acoustic neuroma, and OSA on CPAP who presents for evaluation of OA LEFT KNEE. The patient has a history of pain and functional disability in the right knee due to arthritis and has failed non-surgical conservative treatments for greater than 12 weeks to include NSAID's and/or analgesics, corticosteriod injections and activity modification.  Onset of symptoms was gradual, starting >10 years ago with gradually worsening course since that time.  Patient currently rates pain at 8 out of 10 with activity. Patient has night pain, worsening of pain with activity and weight bearing and pain that interferes with activities of daily living.  Patient has evidence of periarticular osteophytes and joint space narrowing by imaging studies.  There is no active infection.  Past Medical History:  Diagnosis Date  . Acoustic neuroma ([address]) followed by dr brown at [facility]   right side w/ chronic roaring noise  . Anticoagulant long-term use    xarelto  . Benign paroxysmal positional vertigo   . Endometrial polyp   . Frequency of urination   . Hyperlipidemia   . Hypertension   . OA (osteoarthritis)    left knee  . OSA on CPAP   . PAF (paroxysmal atrial fibrillation) [facility]) cardiologist-  dr Marlou Porch   dx 04/ 2017  . PMB (postmenopausal bleeding)   . TMJ (temporomandibular joint disorder)    left side-- wears guard  . Type 2 diabetes mellitus ([place])   . Wears contact lenses    Past Surgical History:  Procedure Laterality Date  . ATRIAL FIBRILLATION ABLATION  05/29/2019  . ATRIAL FIBRILLATION ABLATION N/A 05/29/2019   Procedure: ATRIAL FIBRILLATION ABLATION;  Surgeon:  [name], MD;  Location: [place] [facility];  Service: Cardiovascular;  Laterality: N/A;  . ATRIAL FIBRILLATION ABLATION N/A 10/29/2019   Procedure: ATRIAL FIBRILLATION ABLATION;  Surgeon: [name], MD;  Location: [place] [facility];  Service: Cardiovascular;  Laterality: N/A;  . CARDIOVERSION N/A 06/24/2019   Procedure: CARDIOVERSION;  Surgeon: Skeet Latch, MD;  Location: [place];  Service: Cardiovascular;  Laterality: N/A;  . CARPAL TUNNEL RELEASE Right 04/17/2007   w/ Excision ganglion cyst and Pulley Release right thumb  . DILATATION & CURETTAGE/HYSTEROSCOPY WITH MYOSURE N/A 01/12/2017   Procedure: DILATATION & CURETTAGE/HYSTEROSCOPY WITH MYOSURE;  Surgeon: Arvella Nigh, MD;  Location: [place];  Service: Gynecology;  Laterality: N/A;  . ESOPHAGOGASTRODUODENOSCOPY (EGD) WITH PROPOFOL N/A 09/16/2018   Procedure: ESOPHAGOGASTRODUODENOSCOPY (EGD) WITH PROPOFOL;  Surgeon: Ronald Lobo, MD;  Location: [place];  Service: Endoscopy;  Laterality: N/A;  . GIVENS CAPSULE STUDY N/A 09/16/2018   Procedure: GIVENS CAPSULE STUDY;  Surgeon: Ronald Lobo, MD;  Location: [place];  Service: Endoscopy;  Laterality: N/A;  . HAMMER TOE SURGERY Left 2010   left second toe  . IR RADIOLOGY PERIPHERAL GUIDED IV START  10/27/2019  . IR US GUIDE VASC ACCESS RIGHT  10/27/2019  . KNEE ARTHROSCOPY Right 2010  . KNEE SURGERY Left 1993  . LYMPH NODE BIOPSY N/A 03/01/2017   Procedure: SENTINEL LYMPH NODE BIOPSY;  Surgeon: Everitt Amber, MD;  Location: [facility];  Service: Gynecology;  Laterality: N/A;  . NASAL SINUS SURGERY  1985 and 1995  . ROBOTIC ASSISTED TOTAL HYSTERECTOMY WITH BILATERAL SALPINGO OOPHERECTOMY Bilateral 03/01/2017   Procedure: XI ROBOTIC ASSISTED TOTAL HYSTERECTOMY WITH BILATERAL SALPINGO OOPHORECTOMY;  Surgeon: Everitt Amber, MD;  Location: [facility];  Service: Gynecology;  Laterality: Bilateral;  . ROTATOR CUFF REPAIR Right 04/2004  . TOE SURGERY Right  2012   Great toe and second toe  . TOTAL ELBOW REPLACEMENT Right 2000  . TOTAL KNEE ARTHROPLASTY Right 01/12/2010  . TRANSTHORACIC ECHOCARDIOGRAM  03/28/2016   ef 60-65%/ mild AR and MR  . TUBAL LIGATION Bilateral 1978   Allergies  Allergen Reactions  . Augmentin [Amoxicillin-Pot Clavulanate] Itching    severe    . Cephalosporins Itching    severe   Prior to Admission medications   Medication Sig Start Date End Date Taking? Authorizing Provider  acetaminophen (TYLENOL) 500 MG tablet Take 500-1,000 mg by mouth every 8 (eight) hours as needed for moderate pain or headache.     [provider]  atorvastatin (LIPITOR) 40 MG tablet Take 40 mg by mouth every morning.  08/07/13   [provider]  betamethasone valerate (VALISONE) 0.1 % cream Apply 1 application topically 2 (two) times a week.  10/18/18   [provider]  cetirizine (ZYRTEC) 10 MG tablet Take 10 mg by mouth daily as needed for allergies.    [provider]  Cholecalciferol (VITAMIN D3) 5000 units TABS Take 5,000 Units by mouth daily.     [provider]  Cranberry-Vitamin C (CRANBERRY CONCENTRATE/VITAMINC PO) Take 1 tablet by mouth daily.     [provider]  docusate sodium (COLACE) 100 MG capsule Take 100 mg by mouth daily.    [provider]  hydrochlorothiazide (HYDRODIURIL) 12.5 MG tablet Take 12.5 mg by mouth daily.     [provider]  ipratropium (ATROVENT) 0.03 % nasal spray Place 2 sprays into both nostrils every 12 (twelve) hours as needed for rhinitis.    [provider]  JANUVIA 100 MG tablet Take 100 mg by mouth daily after supper.  04/01/14   [provider]  Lancets (ONETOUCH DELICA PLUS [id number]) [place]  01/08/19   [provider]  metoprolol succinate (TOPROL-XL) 50 MG 24 hr tablet Take 50 mg by mouth daily. 06/18/13   [provider]  Multiple Vitamin (MULTIVITAMIN) tablet Take 1 tablet by mouth daily.     [provider]  olmesartan (BENICAR) 20 MG tablet Take 20 mg by mouth daily. 08/28/19   [provider]  ONE TOUCH ULTRA TEST test strip  01/08/19   [provider]  Polyethyl Glycol-Propyl Glycol (SYSTANE OP) Place 1 drop into both eyes daily as needed (dry eyes).    [provider]  saline (AYR) GEL Place 1 application into the nose at bedtime.    [provider]  XARELTO 20 MG TABS tablet TAKE ONE TABLET BY MOUTH ONE TIME DAILY with supper 01/19/20   [name], MD   Social History   Socioeconomic History  . Marital status: Married    Spouse name: Not on file  . Number of children: Not on file  . Years of education: Not on file  . Highest education level: Not on file  Occupational History  . Not on file  Tobacco Use  . Smoking status: Never Smoker  . Smokeless tobacco: Never Used  Substance and Sexual Activity  . Alcohol use: No  . Drug use: No  . Sexual activity: Not on file  Other Topics  Concern  . Not on file  Social History Narrative   Married w/2 children (boy and girl).  Retired.   Social Determinants of Health   Financial Resource Strain:   . Difficulty of Paying Living Expenses:   Food Insecurity:   . Worried About Charity fundraiser in the Last Year:   . Arboriculturist in the Last Year:   Transportation Needs:   . Film/video editor (Medical):   Marland Kitchen Lack of Transportation (Non-Medical):   Physical Activity:   . Days of Exercise per Week:   . Minutes of Exercise per Session:   Stress:   . Feeling of Stress :   Social Connections:   . Frequency of Communication with Friends and Family:   . Frequency of Social Gatherings with Friends and Family:   . Attends Religious Services:   . Active Member of Clubs or Organizations:   . Attends Archivist Meetings:   Marland Kitchen Marital Status:    Family History  Problem Relation Age of Onset  . Heart failure Father   . Heart failure Mother     ROS:  Currently denies lightheadedness, dizziness, Fever, chills, CP, SOB.   No personal history of DVT, PE, MI, or CVA. No loose teeth.  [address], She wears glasses and contacts.  +Decreased hearing Right Ear as well as tinnitus.  Occasional headache. All other systems have been reviewed and were otherwise currently negative with the exception of those mentioned in the HPI and as above.  Objective: Vitals: Ht: 5'3" Wt: 211 lbs Temp: 97.6 BP: 149/80 Pulse: 60 O2 99% on room air.   Physical Exam: General: Alert, NAD.  Antalgic Gait  HEENT: EOMI, Good Neck Extension  Pulm: No increased work of breathing.  Clear B/L A/P w/o crackle or wheeze.  CV: RRR, No m/g/r appreciated  GI: soft, NT, ND Neuro: Neuro without gross focal deficit.  Sensation intact distally Skin: No lesions in the area of chief complaint MSK/Surgical Site: Left knee w/o redness or effusion.  + JLT. ROM 0-115.  5/5 strength in extension and flexion.  +EHL/FHL.  NVI.  Stable varus and valgus stress.    Imaging Review Plain radiographs demonstrate severe degenerative joint disease of the left knee.   Preoperative templating of the joint replacement has been completed, documented, and submitted to the Operating Room personnel in order to optimize intra-operative equipment management.  Assessment: OA LEFT KNEE Principal Problem:   Primary osteoarthritis of left knee Active Problems:   Paroxysmal atrial fibrillation (HCC)   Diabetes (HCC)   Chronic anticoagulation   Atrial fibrillation (HCC)   Plan: Plan for Procedure(s): TOTAL KNEE ARTHROPLASTY  The patient history, physical exam, clinical judgement of the provider and imaging are consistent with end stage degenerative joint disease and Total joint arthroplasty is deemed medically necessary. The treatment options including medical management, injection therapy, and arthroplasty were discussed at length. The risks and benefits of Procedure(s): TOTAL KNEE ARTHROPLASTY  were presented and reviewed.  The risks of nonoperative treatment, versus surgical intervention including but not limited to continued pain, aseptic loosening, stiffness, dislocation/subluxation, infection, bleeding, nerve injury, blood clots, cardiopulmonary complications, morbidity, mortality, among others were discussed. The patient verbalizes understanding and wishes to proceed with the plan.  Patient is being admitted for inpatient treatment for surgery, pain control, PT, prophylactic antibiotics, VTE prophylaxis, progressive ambulation, ADL's and discharge planning.   Dental prophylaxis discussed and recommended for 2 years postoperatively.   The patient does meet the  criteria for TXA which will be used perioperatively.    Xarelto  will be resumed postoperatively for Afib / DVT prophylaxis in addition to SCDs, and early ambulation.  Plan for Tylenol, oxycodone for pain.  Baclofen for spasm.   The patient is planning to be discharged home with  HHPT [facility]) in care of her husband.   Patient's anticipated LOS is less than 2 midnights, meeting these requirements: - Lives within 1 hour of care - Has a competent adult at home to recover with post-op recover - NO history of  - Chronic pain requiring opiods  - Coronary Artery Disease  - Heart failure  - Heart attack  - Stroke  - DVT/VTE  - Respiratory Failure/COPD  - Renal failure  - Anemia  - Advanced Liver disease   Prudencio Burly III, PA-C 02/06/2020 12:25 PM

## 2020-02-10 ENCOUNTER — Telehealth: Payer: Self-pay | Admitting: Podiatry

## 2020-02-10 NOTE — Telephone Encounter (Signed)
Pt called wanting earlier appt offered a earlier time did not work for pt 02/10/20

## 2020-02-20 NOTE — Patient Instructions (Addendum)
DUE TO COVID-19 ONLY ONE VISITOR IS ALLOWED TO COME WITH YOU AND STAY IN THE WAITING ROOM ONLY DURING PRE OP AND PROCEDURE DAY OF SURGERY. THE 1 VISITOR MAY VISIT WITH YOU AFTER SURGERY IN YOUR PRIVATE ROOM DURING VISITING HOURS ONLY!  YOU NEED TO HAVE A COVID 19 TEST ON 02-27-20@100pm____ , THIS TEST MUST BE DONE BEFORE SURGERY, COME  [address], [place] , [address].  ([place]) ONCE YOUR COVID TEST IS COMPLETED, PLEASE BEGIN THE QUARANTINE INSTRUCTIONS AS OUTLINED IN YOUR HANDOUT.                JOLANTA VANGORDEN  02/20/2020   Your procedure is scheduled on: 03-02-20   Report to [facility] Main  Entrance    Report to Admitting at 1015     Call this number if you have problems the morning of surgery [phone]    Remember: NO SOLID FOOD AFTER MIDNIGHT THE NIGHT PRIOR TO SURGERY. NOTHING BY MOUTH EXCEPT CLEAR LIQUIDS UNTIL 9:35 AM . PLEASE FINISH G2 DRINK PER SURGEON ORDER  WHICH NEEDS TO BE COMPLETED AT 9:35 AM .   Take these medicines the morning of surgery with A SIP OF WATER: Atorvastatin (Lipitor), Zyrtec, prn, and Metoprolol Succinate (Toprol-XL)  DO NOT TAKE ANY DIABETIC MEDICATIONS DAY OF YOUR SURGERY                               You may not have any metal on your body including hair pins and              piercings     Do not wear jewelry, make-up, lotions, powders or perfumes, deodorant              Do not wear nail polish on your fingernails.  Do not shave  48 hours prior to surgery.               Do not bring valuables to the hospital. [place].  Contacts, dentures or bridgework may not be worn into surgery.  You may bring overnight bag    Special Instructions: N/A              Please read over the following fact sheets you were given: _____________________________________________________________________  How to Manage Your Diabetes Before and After Surgery  Why is it important  to control my blood sugar before and after surgery? . Improving blood sugar levels before and after surgery helps healing and can limit problems. . A way of improving blood sugar control is eating a healthy diet by: o  Eating less sugar and carbohydrates o  Increasing activity/exercise o  Talking with your doctor about reaching your blood sugar goals . High blood sugars (greater than 180 mg/dL) can raise your risk of infections and slow your recovery, so you will need to focus on controlling your diabetes during the weeks before surgery. . Make sure that the doctor who takes care of your diabetes knows about your planned surgery including the date and location.  How do I manage my blood sugar before surgery? . Check your blood sugar at least 4 times a day, starting 2 days before surgery, to make sure that the level is not too high or low. o Check your blood sugar the morning of your surgery when  you wake up and every 2 hours until you get to the Short Stay unit. . If your blood sugar is less than 70 mg/dL, you will need to treat for low blood sugar: o Do not take insulin. o Treat a low blood sugar (less than 70 mg/dL) with  cup of clear juice (cranberry or apple), 4 glucose tablets, OR glucose gel. o Recheck blood sugar in 15 minutes after treatment (to make sure it is greater than 70 mg/dL). If your blood sugar is not greater than 70 mg/dL on recheck, call [phone] for further instructions. . Report your blood sugar to the short stay nurse when you get to Short Stay.  . If you are admitted to the hospital after surgery: o Your blood sugar will be checked by the staff and you will probably be given insulin after surgery (instead of oral diabetes medicines) to make sure you have good blood sugar levels. o The goal for blood sugar control after surgery is 80-180 mg/dL.   WHAT DO I DO ABOUT MY DIABETES MEDICATION?  Marland Kitchen Do not take oral diabetes medicines (pills) the morning of  surgery.  . THE DAY BEFORE SURGERY, take your usual [facility] - Preparing for Surgery Before surgery, you can play an important role.  Because skin is not sterile, your skin needs to be as free of germs as possible.  You can reduce the number of germs on your skin by washing with CHG (chlorahexidine gluconate) soap before surgery.  CHG is an antiseptic cleaner which kills germs and bonds with the skin to continue killing germs even after washing. Please DO NOT use if you have an allergy to CHG or antibacterial soaps.  If your skin becomes reddened/irritated stop using the CHG and inform your nurse when you arrive at Short Stay. Do not shave (including legs and underarms) for at least 48 hours prior to the first CHG shower.  You may shave your face/neck. Please follow these instructions carefully:  1.  Shower with CHG Soap the night before surgery and the  morning of Surgery.  2.  If you choose to wash your hair, wash your hair first as usual with your  normal  shampoo.  3.  After you shampoo, rinse your hair and body thoroughly to remove the  shampoo.                           4.  Use CHG as you would any other liquid soap.  You can apply chg directly  to the skin and wash                       Gently with a scrungie or clean washcloth.  5.  Apply the CHG Soap to your body ONLY FROM THE NECK DOWN.   Do not use on face/ open                           Wound or open sores. Avoid contact with eyes, ears mouth and genitals (private parts).                       Wash face,  Genitals (private parts) with your normal soap.             6.  Wash thoroughly, paying special attention to the area where your surgery  will be performed.  7.  Thoroughly rinse your body with warm water from the neck down.  8.  DO NOT shower/wash with your normal soap after using and rinsing off  the CHG Soap.                9.  Pat yourself dry with a clean towel.            10.  Wear clean  pajamas.            11.  Place clean sheets on your bed the night of your first shower and do not  sleep with pets. Day of Surgery : Do not apply any lotions/deodorants the morning of surgery.  Please wear clean clothes to the hospital/surgery center.  FAILURE TO FOLLOW THESE INSTRUCTIONS MAY RESULT IN THE CANCELLATION OF YOUR SURGERY PATIENT SIGNATURE_________________________________  NURSE SIGNATURE__________________________________  ________________________________________________________________________   Adam Phenix  An incentive spirometer is a tool that can help keep your lungs clear and active. This tool measures how well you are filling your lungs with each breath. Taking long deep breaths may help reverse or decrease the chance of developing breathing (pulmonary) problems (especially infection) following:  A long period of time when you are unable to move or be active. BEFORE THE PROCEDURE   If the spirometer includes an indicator to show your best effort, your nurse or respiratory therapist will set it to a desired goal.  If possible, sit up straight or lean slightly forward. Try not to slouch.  Hold the incentive spirometer in an upright position. INSTRUCTIONS FOR USE  1. Sit on the edge of your bed if possible, or sit up as far as you can in bed or on a chair. 2. Hold the incentive spirometer in an upright position. 3. Breathe out normally. 4. Place the mouthpiece in your mouth and seal your lips tightly around it. 5. Breathe in slowly and as deeply as possible, raising the piston or the ball toward the top of the column. 6. Hold your breath for 3-5 seconds or for as long as possible. Allow the piston or ball to fall to the bottom of the column. 7. Remove the mouthpiece from your mouth and breathe out normally. 8. Rest for a few seconds and repeat Steps 1 through 7 at least 10 times every 1-2 hours when you are awake. Take your time and take a few normal breaths  between deep breaths. 9. The spirometer may include an indicator to show your best effort. Use the indicator as a goal to work toward during each repetition. 10. After each set of 10 deep breaths, practice coughing to be sure your lungs are clear. If you have an incision (the cut made at the time of surgery), support your incision when coughing by placing a pillow or rolled up towels firmly against it. Once you are able to get out of bed, walk around indoors and cough well. You may stop using the incentive spirometer when instructed by your caregiver.  RISKS AND COMPLICATIONS  Take your time so you do not get dizzy or light-headed.  If you are in pain, you may need to take or ask for pain medication before doing incentive spirometry. It is harder to take a deep breath if you are having pain. AFTER USE  Rest and breathe slowly and easily.  It can be helpful to keep track of a log of your progress.  Your caregiver can provide you with a simple table to help with this. If you are using the spirometer at home, follow these instructions: [place] IF:   You are having difficultly using the spirometer.  You have trouble using the spirometer as often as instructed.  Your pain medication is not giving enough relief while using the spirometer.  You develop fever of 100.5 F (38.1 C) or higher. SEEK IMMEDIATE MEDICAL CARE IF:   You cough up bloody sputum that had not been present before.  You develop fever of 102 F (38.9 C) or greater.  You develop worsening pain at or near the incision site. MAKE SURE YOU:   Understand these instructions.  Will watch your condition.  Will get help right away if you are not doing well or get worse. Document Released: 02/19/2007 Document Revised: 01/01/2012 Document Reviewed: 04/22/2007 [facility] Patient Information 2014 [place], [place].   ________________________________________________________________________

## 2020-02-20 NOTE — Progress Notes (Signed)
PCP - [name], MD w/surgerical clearance dated 12-08-19  Cardiologist - [name], MD w/ Cassell Clement 01-27-20  Chest x-ray -  EKG - 01-27-20 Stress Test - 06-28-18 ECHO -  Cardiac Cath -   Sleep Study -  CPAP -    Fasting Blood Sugar -  Checks Blood Sugar _____ times a day  Blood Thinner Instructions: Xarelto Aspirin Instructions: Last Dose:  Anesthesia review: Hx of HTN, DM, A-Fib, OSA  Patient denies shortness of breath, fever, cough and chest pain at PAT appointment   Patient verbalized understanding of instructions that were given to them at the PAT appointment. Patient was also instructed that they will need to review over the PAT instructions again at home before surgery.

## 2020-02-23 ENCOUNTER — Other Ambulatory Visit: Payer: Self-pay

## 2020-02-23 ENCOUNTER — Encounter (HOSPITAL_COMMUNITY): Payer: Self-pay

## 2020-02-23 ENCOUNTER — Encounter (HOSPITAL_COMMUNITY)
Admission: RE | Admit: 2020-02-23 | Discharge: 2020-02-23 | Disposition: A | Discharge status: Home / Self Care | Payer: Medicare HMO | Source: Ambulatory Visit | Attending: Orthopedic Surgery | Admitting: Orthopedic Surgery

## 2020-02-23 DIAGNOSIS — E785 Hyperlipidemia, unspecified: Secondary | ICD-10-CM | POA: Insufficient documentation

## 2020-02-23 DIAGNOSIS — Z01818 Encounter for other preprocedural examination: Secondary | ICD-10-CM | POA: Insufficient documentation

## 2020-02-23 DIAGNOSIS — Z79899 Other long term (current) drug therapy: Secondary | ICD-10-CM | POA: Diagnosis not present

## 2020-02-23 DIAGNOSIS — Z7984 Long term (current) use of oral hypoglycemic drugs: Secondary | ICD-10-CM | POA: Insufficient documentation

## 2020-02-23 DIAGNOSIS — Z96651 Presence of right artificial knee joint: Secondary | ICD-10-CM | POA: Diagnosis not present

## 2020-02-23 DIAGNOSIS — Z96621 Presence of right artificial elbow joint: Secondary | ICD-10-CM | POA: Insufficient documentation

## 2020-02-23 DIAGNOSIS — E119 Type 2 diabetes mellitus without complications: Secondary | ICD-10-CM | POA: Diagnosis not present

## 2020-02-23 DIAGNOSIS — M1712 Unilateral primary osteoarthritis, left knee: Secondary | ICD-10-CM | POA: Diagnosis not present

## 2020-02-23 DIAGNOSIS — Z7901 Long term (current) use of anticoagulants: Secondary | ICD-10-CM | POA: Insufficient documentation

## 2020-02-23 DIAGNOSIS — I10 Essential (primary) hypertension: Secondary | ICD-10-CM | POA: Insufficient documentation

## 2020-02-23 DIAGNOSIS — I480 Paroxysmal atrial fibrillation: Secondary | ICD-10-CM | POA: Insufficient documentation

## 2020-02-23 DIAGNOSIS — G4733 Obstructive sleep apnea (adult) (pediatric): Secondary | ICD-10-CM | POA: Diagnosis not present

## 2020-02-23 HISTORY — DX: Anemia, unspecified: D64.9

## 2020-02-23 HISTORY — DX: Family history of other specified conditions: Z84.89

## 2020-02-23 HISTORY — DX: Cardiac arrhythmia, unspecified: I49.9

## 2020-02-23 HISTORY — DX: Personal history of other medical treatment: Z92.89

## 2020-02-23 HISTORY — DX: Pneumonia, unspecified organism: J18.9

## 2020-02-23 LAB — URINALYSIS, ROUTINE W REFLEX MICROSCOPIC
Bacteria, UA: NONE SEEN
Bilirubin Urine: NEGATIVE
Glucose, UA: NEGATIVE mg/dL
Hgb urine dipstick: NEGATIVE
Ketones, ur: NEGATIVE mg/dL
Nitrite: NEGATIVE
Protein, ur: NEGATIVE mg/dL
Specific Gravity, Urine: 1.003 — ABNORMAL LOW (ref 1.005–1.030)
pH: 8 (ref 5.0–8.0)

## 2020-02-23 LAB — BASIC METABOLIC PANEL
Anion gap: 10 (ref 5–15)
BUN: 13 mg/dL (ref 8–23)
CO2: 28 mmol/L (ref 22–32)
Calcium: 9.7 mg/dL (ref 8.9–10.3)
Chloride: 101 mmol/L (ref 98–111)
Creatinine, Ser: 0.59 mg/dL (ref 0.44–1.00)
GFR calc Af Amer: >60 mL/min (ref >60)
GFR calc non Af Amer: >60 mL/min (ref >60)
Glucose, Bld: 88 mg/dL (ref 70–99)
Potassium: 4.1 mmol/L (ref 3.5–5.1)
Sodium: 139 mmol/L (ref 135–145)

## 2020-02-23 LAB — CBC
HCT: 44 % (ref 36.0–46.0)
Hemoglobin: 14.9 g/dL (ref 12.0–15.0)
MCH: 29.8 pg (ref 26.0–34.0)
MCHC: 33.9 g/dL (ref 30.0–36.0)
MCV: 88 fL (ref 80.0–100.0)
Platelets: 153 10*3/uL (ref 150–400)
RBC: 5 MIL/uL (ref 3.87–5.11)
RDW: 12.2 % (ref 11.5–15.5)
WBC: 6.5 10*3/uL (ref 4.0–10.5)
nRBC: 0 % (ref 0.0–0.2)

## 2020-02-23 LAB — GLUCOSE, CAPILLARY: Glucose-Capillary: 81 mg/dL (ref 70–99)

## 2020-02-23 LAB — HEMOGLOBIN A1C
Hgb A1c MFr Bld: 5.6 % (ref 4.8–5.6)
Mean Plasma Glucose: 114.02 mg/dL

## 2020-02-23 LAB — SURGICAL PCR SCREEN
MRSA, PCR: NEGATIVE
Staphylococcus aureus: NEGATIVE

## 2020-02-23 NOTE — Care Plan (Signed)
Ortho Bundle Case Management Note  Patient Details  Name: Erika Kim MRN: [id number] Date of Birth: Dec 18, 1945  Spoke with patient in the office prior to surgery. She will discharge to home with family to assist. Has rolling walker, CPM ordered.  HHPT referral to [facility]. OPPT set up at [facility]. Patient and MD in agreement with plan. Choice offered.                    DME Arranged:  [facility] DME Agency:  [facility]  HH Arranged:  PT [place] Agency:  [facility]  Additional Comments: Please contact me with any questions of if this plan should need to change.  Ladell Heads,  [place] [facility] Specialist  [phone] 02/23/2020, 3:14 PM

## 2020-02-23 NOTE — Progress Notes (Addendum)
NO TOPROL AM OF SURGERY. TAKE TOPROL THE PM BEFORE AS SCHEDULED.    CLEAR LIQUID DIET   Foods Allowed                                                                     Foods Excluded  Coffee and tea, regular and decaf                             liquids that you cannot  Plain Jell-O any favor except red or purple                                           see through such as: Fruit ices (not with fruit pulp)                                     milk, soups, orange juice  Iced Popsicles                                    All solid food Carbonated beverages, regular and diet                                    Cranberry, grape and apple juices Sports drinks like Gatorade Lightly seasoned clear broth or consume(fat free) Sugar, honey syrup  Sample Menu Breakfast                                Lunch                                     Supper Cranberry juice                    Beef broth                            Chicken broth Jell-O                                     Grape juice                           Apple juice Coffee or tea                        Jell-O                                      Popsicle  Coffee or tea                        Coffee or tea  _____________________________________________________________________ Bring CPAP mask and tubing day of surgery.

## 2020-02-23 NOTE — Progress Notes (Signed)
Anesthesia Review:  PCP: Philip Aspen  Clearance 12/08/19- on chart  OFFICE VISIT NOTE 01/29/20 ON CHART  Cardiologist :Charlyne Mom 01/27/20-LOV- DR Curt Bears  01/15/20- LOV- [facility]  Chest x-ray : EKG :01/27/20-epic  10/29/19- atrial fib ablation  10/28/19- CT cardiac  Echo : Cardiac Cath :  Sleep Study/ CPAP : Fasting Blood Sugar :      / Checks Blood Sugar -- times a day:   Blood Thinner/ Instructions /Last Dose: xarelto - last dose 02/29/20 per patient  ASA / Instructions/ Last Dose :   Patient denies shortness of breath, chest pain, fever, and cough at this phone interview.

## 2020-02-23 NOTE — Progress Notes (Signed)
U/A done 5/3 21 routed via epic to surgeon.

## 2020-02-25 NOTE — Progress Notes (Signed)
Anesthesia Chart Review   Case: [id number] Date/Time: 03/02/20 1221   Procedure: TOTAL KNEE ARTHROPLASTY (Left Knee)   Anesthesia type: Choice   Pre-op diagnosis: OA LEFT KNEE   Location: [facility] 08 / [facility]   Surgeons: Renette Butters, MD      DISCUSSION:74 y.o. never smoker with h/o HTN, DM II, PAF (on Xarelto, s/p ablation 10/29/19), HLD, OSA on CPAP, TMJ, left knee OA scheduled for above procedure 03/02/2020 with Dr. Edmonia Lynch.   Clearance received from PCP, on chart.   Pt last seen by electrophysiology 01/27/2020.  Per OV note pt has remained in sinus rhythm since repeat ablation 10/29/2019.  6 month follow up recommended.   Last seen by cardiologist, Dr. [name], 01/15/2020.  Per OV note, " - ok to proceed surgery with low overall cardiac risk at this point.  No further testing required.  She would need to hold her Xarelto for 2 days prior to surgery.  Creatinine 0.69.  Resume Xarelto when able postop.  She sees Dr. Fredonia Highland"  Pt reports last dose of Xarelto 02/29/20.   Anticipate pt can proceed with planned procedure barring acute status change.   VS: BP 134/80   Pulse 68   Temp 37 C (Oral)   Resp 18   Ht 5\' 3"  (1.6 m)   Wt 97.1 kg   SpO2 (!) 70%   BMI 37.91 kg/m   PROVIDERS: [name], MD is PCP   [name], MD is Cardiologist  LABS: Labs reviewed: Acceptable for surgery. (all labs ordered are listed, but only abnormal results are displayed)  Labs Reviewed  URINALYSIS, ROUTINE W REFLEX MICROSCOPIC - Abnormal; Notable for the following components:      Result Value   Color, Urine STRAW (*)    Specific Gravity, Urine 1.003 (*)    Leukocytes,Ua TRACE (*)    All other components within normal limits  SURGICAL PCR SCREEN  HEMOGLOBIN [id number]  BASIC METABOLIC PANEL  CBC  GLUCOSE, CAPILLARY     IMAGES:   EKG: 01/27/2020 Rate 66 bpm    CV: ETT 06/29/2015  Blood pressure demonstrated a normal response to exercise.  There was no ST segment  deviation noted during stress.   Negative ETT but patient only achieved 78% of PMHR at 118 bpm Normal hemodynamic response Rare PVCls with stress   Echo 03/28/2016 Study Conclusions   - Left ventricle: The cavity size was normal. Systolic function was  normal. The estimated ejection fraction was in the range of 60%  to 65%. Wall motion was normal; there were no regional wall  motion abnormalities. The transmitral flow pattern was normal.  Left ventricular diastolic function parameters were normal.  - Aortic valve: Moderate focal calcification, consistent with  sclerosis. There was mild regurgitation.  - Mitral valve: There was mild regurgitation. Past Medical History:  Diagnosis Date  . Acoustic neuroma ([place]) followed by dr brown at [facility]   right side w/ chronic roaring noise  . Anemia    hx of 2019 realted to Xarelto   . Anticoagulant long-term use    xarelto  . Benign paroxysmal positional vertigo   . Dysrhythmia    afib- 2 ablations followed by Dr Marlou Porch - lov - 4/21   . Endometrial polyp   . Family history of adverse reaction to anesthesia    daughter- nausea   . Frequency of urination   . Hx of transfusion of packed red blood cells    related to small  intestine in 2019   . Hyperlipidemia   . Hypertension   . OA (osteoarthritis)    left knee  . OSA on CPAP    cpap - setting at ? 8   . PAF (paroxysmal atrial fibrillation) [facility]) cardiologist-  dr Marlou Porch   dx 04/ 2017  . PMB (postmenopausal bleeding)   . Pneumonia    walking pneumonia -age 65   . TMJ (temporomandibular joint disorder)    left side-- wears guard  . Type 2 diabetes mellitus ([place])   . Wears contact lenses     Past Surgical History:  Procedure Laterality Date  . ABDOMINAL HYSTERECTOMY    . ATRIAL FIBRILLATION ABLATION  05/29/2019  . ATRIAL FIBRILLATION ABLATION N/A 05/29/2019   Procedure: ATRIAL FIBRILLATION ABLATION;  Surgeon: [name], MD;  Location: [place] [facility];   Service: Cardiovascular;  Laterality: N/A;  . ATRIAL FIBRILLATION ABLATION N/A 10/29/2019   Procedure: ATRIAL FIBRILLATION ABLATION;  Surgeon: [name], MD;  Location: [place] [facility];  Service: Cardiovascular;  Laterality: N/A;  . CARDIOVERSION N/A 06/24/2019   Procedure: CARDIOVERSION;  Surgeon: Skeet Latch, MD;  Location: [place];  Service: Cardiovascular;  Laterality: N/A;  . CARPAL TUNNEL RELEASE Right 04/17/2007   w/ Excision ganglion cyst and Pulley Release right thumb  . DILATATION & CURETTAGE/HYSTEROSCOPY WITH MYOSURE N/A 01/12/2017   Procedure: DILATATION & CURETTAGE/HYSTEROSCOPY WITH MYOSURE;  Surgeon: Arvella Nigh, MD;  Location: [place];  Service: Gynecology;  Laterality: N/A;  . ESOPHAGOGASTRODUODENOSCOPY (EGD) WITH PROPOFOL N/A 09/16/2018   Procedure: ESOPHAGOGASTRODUODENOSCOPY (EGD) WITH PROPOFOL;  Surgeon: Ronald Lobo, MD;  Location: [place];  Service: Endoscopy;  Laterality: N/A;  . GIVENS CAPSULE STUDY N/A 09/16/2018   Procedure: GIVENS CAPSULE STUDY;  Surgeon: Ronald Lobo, MD;  Location: [place];  Service: Endoscopy;  Laterality: N/A;  . HAMMER TOE SURGERY Left 2010   left second toe  . IR RADIOLOGY PERIPHERAL GUIDED IV START  10/27/2019  . IR US GUIDE VASC ACCESS RIGHT  10/27/2019  . KNEE ARTHROSCOPY Right 2010  . KNEE SURGERY Left 1993  . LYMPH NODE BIOPSY N/A 03/01/2017   Procedure: SENTINEL LYMPH NODE BIOPSY;  Surgeon: Everitt Amber, MD;  Location[facility];  Service: Gynecology;  Laterality: N/A;  . NASAL SINUS SURGERY  1985 and 1995  . ROBOTIC ASSISTED TOTAL HYSTERECTOMY WITH BILATERAL SALPINGO OOPHERECTOMY Bilateral 03/01/2017   Procedure: XI ROBOTIC ASSISTED TOTAL HYSTERECTOMY WITH BILATERAL SALPINGO OOPHORECTOMY;  Surgeon: Everitt Amber, MD;  Location: [facility];  Service: Gynecology;  Laterality: Bilateral;  . ROTATOR CUFF REPAIR Right 04/2004  . TOE SURGERY Right 2012   Great toe and second toe  . TOTAL ELBOW  REPLACEMENT Right 2000  . TOTAL KNEE ARTHROPLASTY Right 01/12/2010  . TRANSTHORACIC ECHOCARDIOGRAM  03/28/2016   ef 60-65%/ mild AR and MR  . TUBAL LIGATION Bilateral 1978    MEDICATIONS: . acetaminophen (TYLENOL) 500 MG tablet  . acetaminophen (TYLENOL) 650 MG CR tablet  . atorvastatin (LIPITOR) 40 MG tablet  . cetirizine (ZYRTEC) 10 MG tablet  . Cholecalciferol (VITAMIN D3) 5000 units TABS  . Cranberry-Vitamin C (CRANBERRY CONCENTRATE/VITAMINC PO)  . docusate sodium (COLACE) 100 MG capsule  . hydrochlorothiazide (HYDRODIURIL) 12.5 MG tablet  . ipratropium (ATROVENT) 0.03 % nasal spray  . JANUVIA 100 MG tablet  . Lancets (ONETOUCH DELICA PLUS [id number]) MISC  . metoprolol succinate (TOPROL-XL) 50 MG 24 hr tablet  . Multiple Vitamin (MULTIVITAMIN) tablet  . olmesartan (BENICAR) 20 MG tablet  .  ONE TOUCH ULTRA TEST test strip  . Polyethyl Glycol-Propyl Glycol (SYSTANE OP)  . saline (AYR) GEL  . XARELTO 20 MG TABS tablet   No current facility-administered medications for this encounter.     Maia Plan [facility] Pre-Surgical Testing [phone] 02/25/20  3:52 PM

## 2020-02-26 ENCOUNTER — Ambulatory Visit: Payer: Medicare HMO | Admitting: Podiatry

## 2020-02-26 ENCOUNTER — Other Ambulatory Visit: Payer: Self-pay

## 2020-02-26 ENCOUNTER — Encounter: Payer: Self-pay | Admitting: Podiatry

## 2020-02-26 DIAGNOSIS — M778 Other enthesopathies, not elsewhere classified: Secondary | ICD-10-CM

## 2020-02-26 NOTE — Progress Notes (Signed)
She presents today for follow-up of capsulitis dorsal aspect of the right foot. States that is still tender now about to have surgery on the left knee so I was thinking that probably need to get my right foot injected.  Objective: Vital signs are stable alert oriented x3. Pulses are palpable. There is no erythema edema cellulitis drainage or odor to the foot but has tenderness overlying the fourth fifth TMT joint.  Assessment: TMT joint pain with capsulitis fourth right.  Plan: I injected the area today with Kenalog 10 mg and local anesthetic. I will follow-up with her in a few weeks after she has completely healed from her knee surgery.

## 2020-02-27 ENCOUNTER — Other Ambulatory Visit (HOSPITAL_COMMUNITY)
Admission: RE | Admit: 2020-02-27 | Discharge: 2020-02-27 | Disposition: A | Discharge status: Home / Self Care | Payer: Medicare HMO | Source: Ambulatory Visit | Attending: Orthopedic Surgery | Admitting: Orthopedic Surgery

## 2020-02-27 DIAGNOSIS — Z01812 Encounter for preprocedural laboratory examination: Secondary | ICD-10-CM | POA: Diagnosis not present

## 2020-02-27 DIAGNOSIS — Z20822 Contact with and (suspected) exposure to covid-19: Secondary | ICD-10-CM | POA: Insufficient documentation

## 2020-02-28 LAB — SARS CORONAVIRUS 2 (TAT 6-24 HRS): SARS Coronavirus 2: NEGATIVE

## 2020-03-01 DIAGNOSIS — G4733 Obstructive sleep apnea (adult) (pediatric): Secondary | ICD-10-CM | POA: Diagnosis not present

## 2020-03-01 MED ORDER — BUPIVACAINE LIPOSOME 1.3 % IJ SUSP
20.0000 mL | Freq: Once | INTRAMUSCULAR | Status: DC
  Filled 2020-03-01: qty 20

## 2020-03-02 ENCOUNTER — Encounter (HOSPITAL_COMMUNITY)
Admission: RE | Disposition: A | Discharge status: Home / Self Care | Payer: Self-pay | Source: Ambulatory Visit | Attending: Orthopedic Surgery

## 2020-03-02 ENCOUNTER — Encounter (HOSPITAL_COMMUNITY): Payer: Self-pay | Admitting: Orthopedic Surgery

## 2020-03-02 ENCOUNTER — Ambulatory Visit (HOSPITAL_COMMUNITY): Payer: Medicare HMO | Admitting: Certified Registered Nurse Anesthetist

## 2020-03-02 ENCOUNTER — Ambulatory Visit (HOSPITAL_COMMUNITY): Payer: Medicare HMO | Admitting: Physician Assistant

## 2020-03-02 ENCOUNTER — Other Ambulatory Visit: Payer: Self-pay

## 2020-03-02 ENCOUNTER — Ambulatory Visit (HOSPITAL_COMMUNITY)
Admission: RE | Admit: 2020-03-02 | Discharge: 2020-03-03 | Disposition: A | Discharge status: Home / Self Care | Payer: Medicare HMO | Source: Ambulatory Visit | Attending: Orthopedic Surgery | Admitting: Orthopedic Surgery

## 2020-03-02 ENCOUNTER — Ambulatory Visit (HOSPITAL_COMMUNITY): Payer: Medicare HMO

## 2020-03-02 DIAGNOSIS — I10 Essential (primary) hypertension: Secondary | ICD-10-CM | POA: Insufficient documentation

## 2020-03-02 DIAGNOSIS — Z96651 Presence of right artificial knee joint: Secondary | ICD-10-CM | POA: Insufficient documentation

## 2020-03-02 DIAGNOSIS — Z79899 Other long term (current) drug therapy: Secondary | ICD-10-CM | POA: Insufficient documentation

## 2020-03-02 DIAGNOSIS — Z7901 Long term (current) use of anticoagulants: Secondary | ICD-10-CM | POA: Insufficient documentation

## 2020-03-02 DIAGNOSIS — I480 Paroxysmal atrial fibrillation: Secondary | ICD-10-CM | POA: Insufficient documentation

## 2020-03-02 DIAGNOSIS — M1712 Unilateral primary osteoarthritis, left knee: Secondary | ICD-10-CM | POA: Diagnosis not present

## 2020-03-02 DIAGNOSIS — E785 Hyperlipidemia, unspecified: Secondary | ICD-10-CM | POA: Insufficient documentation

## 2020-03-02 DIAGNOSIS — Z7984 Long term (current) use of oral hypoglycemic drugs: Secondary | ICD-10-CM | POA: Diagnosis not present

## 2020-03-02 DIAGNOSIS — Z96659 Presence of unspecified artificial knee joint: Secondary | ICD-10-CM

## 2020-03-02 DIAGNOSIS — Z96652 Presence of left artificial knee joint: Secondary | ICD-10-CM | POA: Diagnosis not present

## 2020-03-02 DIAGNOSIS — G4733 Obstructive sleep apnea (adult) (pediatric): Secondary | ICD-10-CM | POA: Insufficient documentation

## 2020-03-02 DIAGNOSIS — Z471 Aftercare following joint replacement surgery: Secondary | ICD-10-CM | POA: Diagnosis not present

## 2020-03-02 DIAGNOSIS — Z96621 Presence of right artificial elbow joint: Secondary | ICD-10-CM | POA: Insufficient documentation

## 2020-03-02 DIAGNOSIS — E119 Type 2 diabetes mellitus without complications: Secondary | ICD-10-CM | POA: Diagnosis not present

## 2020-03-02 DIAGNOSIS — I4891 Unspecified atrial fibrillation: Secondary | ICD-10-CM

## 2020-03-02 DIAGNOSIS — G8918 Other acute postprocedural pain: Secondary | ICD-10-CM | POA: Diagnosis not present

## 2020-03-02 HISTORY — PX: TOTAL KNEE ARTHROPLASTY: SHX125

## 2020-03-02 LAB — GLUCOSE, CAPILLARY
Glucose-Capillary: 75 mg/dL (ref 70–99)
Glucose-Capillary: 116 mg/dL — ABNORMAL HIGH (ref 70–99)
Glucose-Capillary: 105 mg/dL — ABNORMAL HIGH (ref 70–99)
Glucose-Capillary: 180 mg/dL — ABNORMAL HIGH (ref 70–99)

## 2020-03-02 SURGERY — ARTHROPLASTY, KNEE, TOTAL
Anesthesia: General | Site: Knee | Laterality: Left

## 2020-03-02 MED ORDER — FENTANYL CITRATE (PF) 100 MCG/2ML IJ SOLN
INTRAMUSCULAR | Status: AC
  Filled 2020-03-02: qty 4

## 2020-03-02 MED ORDER — ACETAMINOPHEN 325 MG PO TABS: 325.0000 mg | ORAL_TABLET | Freq: Four times a day (QID) | ORAL | Status: DC | PRN

## 2020-03-02 MED ORDER — DEXAMETHASONE SODIUM PHOSPHATE 10 MG/ML IJ SOLN
10.0000 mg | Freq: Once | INTRAMUSCULAR | Status: AC
  Administered 2020-03-03: 06:00:00 10 mg via INTRAVENOUS
  Filled 2020-03-02: qty 1

## 2020-03-02 MED ORDER — CLINDAMYCIN PHOSPHATE 900 MG/50ML IV SOLN
900.0000 mg | INTRAVENOUS | Status: AC
  Administered 2020-03-02: 900 mg via INTRAVENOUS

## 2020-03-02 MED ORDER — METOPROLOL SUCCINATE ER 50 MG PO TB24
50.0000 mg | ORAL_TABLET | Freq: Every day | ORAL | Status: DC
  Administered 2020-03-02: 50 mg via ORAL
  Filled 2020-03-02 (×2): qty 1

## 2020-03-02 MED ORDER — ESMOLOL HCL 100 MG/10ML IV SOLN
INTRAVENOUS | Status: AC
  Filled 2020-03-02: qty 10

## 2020-03-02 MED ORDER — POVIDONE-IODINE 10 % EX SWAB: 2.0000 "application " | Freq: Once | CUTANEOUS | Status: AC

## 2020-03-02 MED ORDER — PHENOL 1.4 % MT LIQD: 1.0000 | OROMUCOSAL | Status: DC | PRN

## 2020-03-02 MED ORDER — INSULIN ASPART 100 UNIT/ML ~~LOC~~ SOLN: 0.0000 [IU] | Freq: Every day | SUBCUTANEOUS | Status: DC

## 2020-03-02 MED ORDER — METOCLOPRAMIDE HCL 5 MG/ML IJ SOLN: 5.0000 mg | Freq: Three times a day (TID) | INTRAMUSCULAR | Status: DC | PRN

## 2020-03-02 MED ORDER — LACTATED RINGERS IV SOLN: INTRAVENOUS | Status: DC

## 2020-03-02 MED ORDER — ROPIVACAINE HCL 5 MG/ML IJ SOLN
INTRAMUSCULAR | Status: DC | PRN
  Administered 2020-03-02: 20 mL via PERINEURAL

## 2020-03-02 MED ORDER — SODIUM CHLORIDE (PF) 0.9 % IJ SOLN
INTRAMUSCULAR | Status: AC
  Filled 2020-03-02: qty 50

## 2020-03-02 MED ORDER — MIDAZOLAM HCL 2 MG/2ML IJ SOLN
1.0000 mg | INTRAMUSCULAR | Status: AC
  Administered 2020-03-02: 2 mg via INTRAVENOUS
  Filled 2020-03-02: qty 2

## 2020-03-02 MED ORDER — ACETAMINOPHEN 500 MG PO TABS
1000.0000 mg | ORAL_TABLET | Freq: Once | ORAL | Status: AC
  Administered 2020-03-02: 1000 mg via ORAL
  Filled 2020-03-02: qty 2

## 2020-03-02 MED ORDER — ONDANSETRON HCL 4 MG/2ML IJ SOLN
INTRAMUSCULAR | Status: DC | PRN
  Administered 2020-03-02: 4 mg via INTRAVENOUS

## 2020-03-02 MED ORDER — SUGAMMADEX SODIUM 200 MG/2ML IV SOLN
INTRAVENOUS | Status: DC | PRN
  Administered 2020-03-02: 200 mg via INTRAVENOUS

## 2020-03-02 MED ORDER — INSULIN ASPART 100 UNIT/ML ~~LOC~~ SOLN: 0.0000 [IU] | Freq: Three times a day (TID) | SUBCUTANEOUS | Status: DC

## 2020-03-02 MED ORDER — OXYCODONE HCL 5 MG PO TABS
5.0000 mg | ORAL_TABLET | ORAL | Status: DC | PRN
  Administered 2020-03-02 (×2): 5 mg via ORAL
  Filled 2020-03-02 (×2): qty 1

## 2020-03-02 MED ORDER — DIPHENHYDRAMINE HCL 12.5 MG/5ML PO ELIX: 12.5000 mg | ORAL_SOLUTION | ORAL | Status: DC | PRN

## 2020-03-02 MED ORDER — TRANEXAMIC ACID-NACL 1000-0.7 MG/100ML-% IV SOLN: 1000.0000 mg | Freq: Once | INTRAVENOUS | Status: DC

## 2020-03-02 MED ORDER — PROPOFOL 500 MG/50ML IV EMUL
INTRAVENOUS | Status: AC
  Filled 2020-03-02: qty 50

## 2020-03-02 MED ORDER — ESMOLOL HCL 100 MG/10ML IV SOLN
INTRAVENOUS | Status: DC | PRN
  Administered 2020-03-02: 10 mg via INTRAVENOUS

## 2020-03-02 MED ORDER — METHOCARBAMOL 500 MG IVPB - SIMPLE MED
INTRAVENOUS | Status: AC
  Filled 2020-03-02: qty 50

## 2020-03-02 MED ORDER — FENTANYL CITRATE (PF) 100 MCG/2ML IJ SOLN
25.0000 ug | INTRAMUSCULAR | Status: DC | PRN
  Administered 2020-03-02 (×3): 50 ug via INTRAVENOUS

## 2020-03-02 MED ORDER — HYDROCHLOROTHIAZIDE 25 MG PO TABS
12.5000 mg | ORAL_TABLET | Freq: Every day | ORAL | Status: DC
  Administered 2020-03-03: 12.5 mg via ORAL
  Filled 2020-03-02: qty 1

## 2020-03-02 MED ORDER — MENTHOL 3 MG MT LOZG: 1.0000 | LOZENGE | OROMUCOSAL | Status: DC | PRN

## 2020-03-02 MED ORDER — HYDROMORPHONE HCL 1 MG/ML IJ SOLN
0.5000 mg | INTRAMUSCULAR | Status: DC | PRN
  Administered 2020-03-02: 1 mg via INTRAVENOUS
  Filled 2020-03-02: qty 1

## 2020-03-02 MED ORDER — AYR SALINE NASAL NA GEL: 1.0000 "application " | Freq: Every day | NASAL | Status: DC

## 2020-03-02 MED ORDER — 0.9 % SODIUM CHLORIDE (POUR BTL) OPTIME
TOPICAL | Status: DC | PRN
  Administered 2020-03-02: 14:00:00 1000 mL

## 2020-03-02 MED ORDER — DEXAMETHASONE SODIUM PHOSPHATE 10 MG/ML IJ SOLN
INTRAMUSCULAR | Status: DC | PRN
  Administered 2020-03-02: 4 mg via INTRAVENOUS

## 2020-03-02 MED ORDER — PHENYLEPHRINE HCL (PRESSORS) 10 MG/ML IV SOLN
INTRAVENOUS | Status: AC
  Filled 2020-03-02: qty 1

## 2020-03-02 MED ORDER — OXYCODONE HCL 5 MG PO TABS: 5.0000 mg | ORAL_TABLET | ORAL | 0 refills | Status: AC | PRN

## 2020-03-02 MED ORDER — FENTANYL CITRATE (PF) 100 MCG/2ML IJ SOLN
INTRAMUSCULAR | Status: AC
  Filled 2020-03-02: qty 2

## 2020-03-02 MED ORDER — DOCUSATE SODIUM 100 MG PO CAPS
100.0000 mg | ORAL_CAPSULE | Freq: Two times a day (BID) | ORAL | Status: DC
  Administered 2020-03-02 – 2020-03-03 (×2): 100 mg via ORAL
  Filled 2020-03-02 (×2): qty 1

## 2020-03-02 MED ORDER — CLINDAMYCIN PHOSPHATE 900 MG/50ML IV SOLN
INTRAVENOUS | Status: AC
  Filled 2020-03-02: qty 50

## 2020-03-02 MED ORDER — PROPOFOL 10 MG/ML IV BOLUS
INTRAVENOUS | Status: DC | PRN
  Administered 2020-03-02: 20 mg via INTRAVENOUS
  Administered 2020-03-02: 150 mg via INTRAVENOUS

## 2020-03-02 MED ORDER — ROCURONIUM BROMIDE 10 MG/ML (PF) SYRINGE
PREFILLED_SYRINGE | INTRAVENOUS | Status: DC | PRN
  Administered 2020-03-02: 100 mg via INTRAVENOUS

## 2020-03-02 MED ORDER — METHOCARBAMOL 500 MG PO TABS
500.0000 mg | ORAL_TABLET | Freq: Four times a day (QID) | ORAL | Status: DC | PRN
  Filled 2020-03-02: qty 1

## 2020-03-02 MED ORDER — METOCLOPRAMIDE HCL 5 MG PO TABS: 5.0000 mg | ORAL_TABLET | Freq: Three times a day (TID) | ORAL | Status: DC | PRN

## 2020-03-02 MED ORDER — SORBITOL 70 % SOLN
30.0000 mL | Freq: Every day | Status: DC | PRN
  Filled 2020-03-02: qty 30

## 2020-03-02 MED ORDER — LIDOCAINE 2% (20 MG/ML) 5 ML SYRINGE
INTRAMUSCULAR | Status: DC | PRN
  Administered 2020-03-02: 40 mg via INTRAVENOUS

## 2020-03-02 MED ORDER — ONDANSETRON HCL 4 MG PO TABS: 4.0000 mg | ORAL_TABLET | Freq: Three times a day (TID) | ORAL | 0 refills | Status: DC | PRN

## 2020-03-02 MED ORDER — IRBESARTAN 150 MG PO TABS
150.0000 mg | ORAL_TABLET | Freq: Every day | ORAL | Status: DC
  Administered 2020-03-02 – 2020-03-03 (×2): 150 mg via ORAL
  Filled 2020-03-02 (×2): qty 1

## 2020-03-02 MED ORDER — TRANEXAMIC ACID-NACL 1000-0.7 MG/100ML-% IV SOLN
1000.0000 mg | INTRAVENOUS | Status: AC
  Administered 2020-03-02: 1000 mg via INTRAVENOUS
  Filled 2020-03-02: qty 100

## 2020-03-02 MED ORDER — MIDAZOLAM HCL 2 MG/2ML IJ SOLN
INTRAMUSCULAR | Status: AC
  Filled 2020-03-02: qty 2

## 2020-03-02 MED ORDER — POLYETHYLENE GLYCOL 3350 17 G PO PACK: 17.0000 g | PACK | Freq: Every day | ORAL | Status: DC | PRN

## 2020-03-02 MED ORDER — CEFAZOLIN SODIUM-DEXTROSE 2-4 GM/100ML-% IV SOLN
INTRAVENOUS | Status: AC
  Filled 2020-03-02: qty 100

## 2020-03-02 MED ORDER — GABAPENTIN 100 MG PO CAPS
100.0000 mg | ORAL_CAPSULE | Freq: Three times a day (TID) | ORAL | Status: DC
  Administered 2020-03-02 – 2020-03-03 (×2): 100 mg via ORAL
  Filled 2020-03-02 (×2): qty 1

## 2020-03-02 MED ORDER — STERILE WATER FOR IRRIGATION IR SOLN
Status: DC | PRN
  Administered 2020-03-02: 2000 mL

## 2020-03-02 MED ORDER — BUPIVACAINE LIPOSOME 1.3 % IJ SUSP
INTRAMUSCULAR | Status: DC | PRN
  Administered 2020-03-02: 20 mL

## 2020-03-02 MED ORDER — ACETAMINOPHEN 500 MG PO TABS
1000.0000 mg | ORAL_TABLET | Freq: Three times a day (TID) | ORAL | 0 refills | Status: AC
Start: 2020-03-02 — End: 2020-03-12

## 2020-03-02 MED ORDER — FENTANYL CITRATE (PF) 250 MCG/5ML IJ SOLN
INTRAMUSCULAR | Status: AC
  Filled 2020-03-02: qty 5

## 2020-03-02 MED ORDER — RIVAROXABAN 10 MG PO TABS: 20.0000 mg | ORAL_TABLET | Freq: Every day | ORAL | Status: DC

## 2020-03-02 MED ORDER — CEFAZOLIN SODIUM-DEXTROSE 2-4 GM/100ML-% IV SOLN: 2.0000 g | INTRAVENOUS | Status: DC

## 2020-03-02 MED ORDER — ONDANSETRON HCL 4 MG PO TABS: 4.0000 mg | ORAL_TABLET | Freq: Four times a day (QID) | ORAL | Status: DC | PRN

## 2020-03-02 MED ORDER — ONDANSETRON HCL 4 MG/2ML IJ SOLN: 4.0000 mg | Freq: Four times a day (QID) | INTRAMUSCULAR | Status: DC | PRN

## 2020-03-02 MED ORDER — ATORVASTATIN CALCIUM 40 MG PO TABS
40.0000 mg | ORAL_TABLET | Freq: Every morning | ORAL | Status: DC
  Administered 2020-03-03: 40 mg via ORAL
  Filled 2020-03-02: qty 1

## 2020-03-02 MED ORDER — METHOCARBAMOL 500 MG IVPB - SIMPLE MED
500.0000 mg | Freq: Four times a day (QID) | INTRAVENOUS | Status: DC | PRN
  Administered 2020-03-02: 500 mg via INTRAVENOUS
  Filled 2020-03-02: qty 50

## 2020-03-02 MED ORDER — FENTANYL CITRATE (PF) 100 MCG/2ML IJ SOLN
50.0000 ug | INTRAMUSCULAR | Status: AC
  Administered 2020-03-02: 100 ug via INTRAVENOUS
  Filled 2020-03-02: qty 2

## 2020-03-02 MED ORDER — LINAGLIPTIN 5 MG PO TABS: 5.0000 mg | ORAL_TABLET | Freq: Every day | ORAL | Status: DC

## 2020-03-02 MED ORDER — ACETAMINOPHEN 500 MG PO TABS
1000.0000 mg | ORAL_TABLET | Freq: Three times a day (TID) | ORAL | Status: DC
  Administered 2020-03-02 – 2020-03-03 (×2): 1000 mg via ORAL
  Filled 2020-03-02 (×3): qty 2

## 2020-03-02 MED ORDER — FENTANYL CITRATE (PF) 100 MCG/2ML IJ SOLN
INTRAMUSCULAR | Status: DC | PRN
  Administered 2020-03-02 (×4): 50 ug via INTRAVENOUS
  Administered 2020-03-02: 100 ug via INTRAVENOUS
  Administered 2020-03-02: 50 ug via INTRAVENOUS

## 2020-03-02 MED ORDER — MAGNESIUM CITRATE PO SOLN: 1.0000 | Freq: Once | ORAL | Status: DC | PRN

## 2020-03-02 MED ORDER — IPRATROPIUM BROMIDE 0.03 % NA SOLN: 2.0000 | Freq: Two times a day (BID) | NASAL | Status: DC | PRN

## 2020-03-02 MED ORDER — POVIDONE-IODINE 10 % EX SWAB
2.0000 "application " | Freq: Once | CUTANEOUS | Status: AC
  Administered 2020-03-02: 2 via TOPICAL

## 2020-03-02 MED ORDER — SALINE SPRAY 0.65 % NA SOLN
1.0000 | Freq: Every day | NASAL | Status: DC
  Filled 2020-03-02: qty 44

## 2020-03-02 MED ORDER — KETOROLAC TROMETHAMINE 15 MG/ML IJ SOLN
7.5000 mg | Freq: Four times a day (QID) | INTRAMUSCULAR | Status: DC
  Administered 2020-03-02 – 2020-03-03 (×3): 7.5 mg via INTRAVENOUS
  Filled 2020-03-02 (×3): qty 1

## 2020-03-02 MED ORDER — DEXAMETHASONE SODIUM PHOSPHATE 10 MG/ML IJ SOLN
INTRAMUSCULAR | Status: DC | PRN
Start: 2020-03-02 — End: 2020-03-02
  Administered 2020-03-02: 5 mg via INTRAVENOUS

## 2020-03-02 MED ORDER — SODIUM CHLORIDE 0.9% FLUSH
INTRAVENOUS | Status: DC | PRN
  Administered 2020-03-02: 30 mL

## 2020-03-02 MED ORDER — PROPOFOL 10 MG/ML IV BOLUS
INTRAVENOUS | Status: AC
  Filled 2020-03-02: qty 20

## 2020-03-02 MED ORDER — CEFAZOLIN SODIUM-DEXTROSE 1-4 GM/50ML-% IV SOLN
1.0000 g | Freq: Four times a day (QID) | INTRAVENOUS | Status: AC
  Administered 2020-03-02 – 2020-03-03 (×2): 1 g via INTRAVENOUS
  Filled 2020-03-02 (×2): qty 50

## 2020-03-02 MED ORDER — BACLOFEN 10 MG PO TABS
10.0000 mg | ORAL_TABLET | Freq: Three times a day (TID) | ORAL | 0 refills | Status: DC | PRN
Start: 2020-03-02 — End: 2020-07-13

## 2020-03-02 MED ORDER — ONDANSETRON HCL 4 MG/2ML IJ SOLN
INTRAMUSCULAR | Status: AC
  Filled 2020-03-02: qty 2

## 2020-03-02 SURGICAL SUPPLY — 49 items
BLADE HEX COATED 2.75 (ELECTRODE) ×2 IMPLANT
BLADE SAG 18X100X1.27 (BLADE) ×2 IMPLANT
BLADE SAGITTAL 25.0X1.37X90 (BLADE) ×2 IMPLANT
BLADE SURG 15 STRL LF DISP TIS (BLADE) ×1 IMPLANT
BLADE SURG 15 STRL SS (BLADE) ×2
BLADE SURG SZ10 CARB STEEL (BLADE) ×4 IMPLANT
BNDG ELASTIC 6X10 VLCR STRL LF (GAUZE/BANDAGES/DRESSINGS) ×2 IMPLANT
BOWL SMART MIX CTS (DISPOSABLE) IMPLANT
CEMENT BONE SIMPLEX SPEEDSET (Cement) IMPLANT
CLSR STERI-STRIP ANTIMIC 1/2X4 (GAUZE/BANDAGES/DRESSINGS) ×4 IMPLANT
COMP FEMORAL TRIATHLON SZ3 (Joint) ×2 IMPLANT
COMPONENT FEMRL TRIATHLON SZ3 (Joint) ×1 IMPLANT
COVER SURGICAL LIGHT HANDLE (MISCELLANEOUS) ×2 IMPLANT
COVER WAND RF STERILE (DRAPES) IMPLANT
CUFF TOURN SGL QUICK 34 (TOURNIQUET CUFF) ×2
CUFF TRNQT CYL 34X4.125X (TOURNIQUET CUFF) ×1 IMPLANT
DECANTER SPIKE VIAL GLASS SM (MISCELLANEOUS) ×2 IMPLANT
DRAPE U-SHAPE 47X51 STRL (DRAPES) ×2 IMPLANT
DRSG MEPILEX BORDER 4X12 (GAUZE/BANDAGES/DRESSINGS) ×2 IMPLANT
DURAPREP 26ML APPLICATOR (WOUND CARE) ×4 IMPLANT
GLOVE BIO SURGEON STRL SZ7.5 (GLOVE) ×4 IMPLANT
GLOVE BIOGEL PI IND STRL 8 (GLOVE) ×2 IMPLANT
GLOVE BIOGEL PI INDICATOR 8 (GLOVE) ×2
GOWN STRL REUS W/ TWL LRG LVL3 (GOWN DISPOSABLE) ×2 IMPLANT
GOWN STRL REUS W/TWL LRG LVL3 (GOWN DISPOSABLE) ×4
HANDPIECE INTERPULSE COAX TIP (DISPOSABLE) ×2
HOLDER FOLEY CATH W/STRAP (MISCELLANEOUS) IMPLANT
IMMOBILIZER KNEE 22 UNIV (SOFTGOODS) ×2 IMPLANT
INSERT TIB CS TRIATH X3 9 (Insert) ×2 IMPLANT
KIT TURNOVER KIT A (KITS) IMPLANT
KNEE PATELLA ASYMMETRIC 9X29 (Knees) ×2 IMPLANT
KNEE TIBIAL COMPONENT SZ3 (Knees) ×2 IMPLANT
MANIFOLD NEPTUNE II (INSTRUMENTS) ×2 IMPLANT
NS IRRIG 1000ML POUR BTL (IV SOLUTION) ×2 IMPLANT
PACK ICE MAXI GEL EZY WRAP (MISCELLANEOUS) ×2 IMPLANT
PACK TOTAL KNEE CUSTOM (KITS) ×2 IMPLANT
PENCIL SMOKE EVACUATOR (MISCELLANEOUS) IMPLANT
PIN FLUTED HEDLESS FIX 3.5X1/8 (PIN) ×2 IMPLANT
PROTECTOR NERVE ULNAR (MISCELLANEOUS) ×2 IMPLANT
SET HNDPC FAN SPRY TIP SCT (DISPOSABLE) ×1 IMPLANT
SUT MNCRL AB 4-0 PS2 18 (SUTURE) ×2 IMPLANT
SUT VIC AB 0 CT1 36 (SUTURE) ×2 IMPLANT
SUT VIC AB 1 CT1 36 (SUTURE) ×4 IMPLANT
SUT VIC AB 2-0 CT1 27 (SUTURE) ×2
SUT VIC AB 2-0 CT1 TAPERPNT 27 (SUTURE) ×1 IMPLANT
TRAY FOLEY MTR SLVR 14FR STAT (SET/KITS/TRAYS/PACK) IMPLANT
TRAY FOLEY MTR SLVR 16FR STAT (SET/KITS/TRAYS/PACK) IMPLANT
WRAP KNEE MAXI GEL POST OP (GAUZE/BANDAGES/DRESSINGS) ×2 IMPLANT
YANKAUER SUCT BULB TIP 10FT TU (MISCELLANEOUS) ×2 IMPLANT

## 2020-03-02 NOTE — Plan of Care (Signed)
Plan of care for post op day 0 discussed with patient.    Husband at bedside.   Will continue to assess and educate.    [place], Therapist, sports

## 2020-03-02 NOTE — Progress Notes (Signed)
Orthopedic Tech Progress Note Patient Details:  Erika Kim [birth year] [id number] Bone foam left on bed CPM Left Knee CPM Left Knee: On Left Knee Flexion (Degrees): 90 Left Knee Extension (Degrees): 0  Post Interventions Patient Tolerated: Well Instructions Provided: Care of device, Adjustment of device  Renaldo Gornick 03/02/2020, 3:35 PM

## 2020-03-02 NOTE — Anesthesia Postprocedure Evaluation (Signed)
Anesthesia Post Note  Patient: Erika Kim  Procedure(s) Performed: TOTAL KNEE ARTHROPLASTY (Left Knee)     Patient location during evaluation: PACU Anesthesia Type: General and Regional Level of consciousness: awake and alert Pain management: pain level controlled Vital Signs Assessment: post-procedure vital signs reviewed and stable Respiratory status: spontaneous breathing, nonlabored ventilation, respiratory function stable and patient connected to nasal cannula oxygen Cardiovascular status: blood pressure returned to baseline and stable Postop Assessment: no apparent nausea or vomiting Anesthetic complications: no    Last Vitals:  Vitals:   03/02/20 1530 03/02/20 1545  BP: (!) 164/77 (!) 162/75  Pulse: 80 76  Resp: 17 13  Temp:    SpO2: 94% 94%    Last Pain:  Vitals:   03/02/20 1545  TempSrc:   PainSc: Asleep                 Anniah Glick L Mataio Mele

## 2020-03-02 NOTE — Anesthesia Procedure Notes (Signed)
Procedure Name: Intubation Date/Time: 03/02/2020 1:02 PM Performed by: Montel Clock, CRNA Pre-anesthesia Checklist: Patient identified, Emergency Drugs available, Suction available, Patient being monitored and Timeout performed Patient Re-evaluated:Patient Re-evaluated prior to induction Oxygen Delivery Method: Circle system utilized Preoxygenation: Pre-oxygenation with 100% oxygen Induction Type: IV induction Ventilation: Mask ventilation without difficulty Laryngoscope Size: Mac and 3 Grade View: Grade II Tube type: Oral Tube size: 7.0 mm Number of attempts: 1 Airway Equipment and Method: Stylet Placement Confirmation: ETT inserted through vocal cords under direct vision,  positive ETCO2 and breath sounds checked- equal and bilateral Secured at: 21 cm Tube secured with: Tape Dental Injury: Teeth and Oropharynx as per pre-operative assessment  Comments: Intubation by [facility] paramedic student.

## 2020-03-02 NOTE — Anesthesia Preprocedure Evaluation (Addendum)
Anesthesia Evaluation  Patient identified by MRN, date of birth, ID band Patient awake    Reviewed: Allergy & Precautions, NPO status , Patient's Chart, lab work & pertinent test results, reviewed documented beta blocker date and time   Airway Mallampati: II  TM Distance: >3 FB Neck ROM: Full    Dental  (+) Caps,  Lower bridge:   Pulmonary sleep apnea and Continuous Positive Airway Pressure Ventilation ,    Pulmonary exam normal breath sounds clear to auscultation       Cardiovascular hypertension, Pt. on home beta blockers and Pt. on medications Normal cardiovascular exam+ dysrhythmias (s/p ablation in xarelto, last dose Saturday 6pm ) Atrial Fibrillation  Rhythm:Regular Rate:Normal  Stress Test 06/2018 Blood pressure demonstrated a normal response to exercise. There was no ST segment deviation noted during stress. Negative ETT but patient only achieved 78% of PMHR at 118 bpm Normal hemodynamic response Rare PVCls with stress   TTE 2017 - Left ventricle: The cavity size was normal. Systolic function was normal. The estimated ejection fraction was in the range of 60% to 65%. Wall motion was normal; there were no regional wall motion abnormalities. The transmitral flow pattern was normal.Left ventricular diastolic function parameters were normal.  - Aortic valve: Moderate focal calcification, consistent with  sclerosis. There was mild regurgitation.  - Mitral valve: There was mild regurgitation.   Neuro/Psych negative neurological ROS  negative psych ROS   GI/Hepatic negative GI ROS, Neg liver ROS,   Endo/Other  negative endocrine ROSdiabetes, Type 2, Oral Hypoglycemic Agents  Renal/GU negative Renal ROS  negative genitourinary   Musculoskeletal  (+) Arthritis ,   Abdominal   Peds  Hematology negative hematology ROS (+)   Anesthesia Other Findings   Reproductive/Obstetrics                           Anesthesia Physical Anesthesia Plan  ASA: III  Anesthesia Plan: General and Regional   Post-op Pain Management:  Regional for Post-op pain   Induction: Intravenous  PONV Risk Score and Plan: 3 and Midazolam, Dexamethasone and Ondansetron  Airway Management Planned: Oral ETT  Additional Equipment:   Intra-op Plan:   Post-operative Plan: Extubation in OR  Informed Consent: I have reviewed the patients History and Physical, chart, labs and discussed the procedure including the risks, benefits and alternatives for the proposed anesthesia with the patient or authorized representative who has indicated his/her understanding and acceptance.     Dental advisory given  Plan Discussed with: CRNA  Anesthesia Plan Comments:        Anesthesia Quick Evaluation

## 2020-03-02 NOTE — Op Note (Signed)
DATE OF SURGERY:  03/02/2020 TIME: 2:15 PM  PATIENT NAME:  Erika Kim   AGE: 74 y.o.    PRE-OPERATIVE DIAGNOSIS:  OA LEFT KNEE  POST-OPERATIVE DIAGNOSIS:  Same  PROCEDURE:  Procedure(s): TOTAL KNEE ARTHROPLASTY   SURGEON:  Renette Butters, MD   ASSISTANT:  Roxan Hockey, PA-C, he was present and scrubbed throughout the case, critical for completion in a timely fashion, and for retraction, instrumentation, and closure.    OPERATIVE IMPLANTS: [employer] Triathlon CR. Press fit knee  Femur size 3, Tibia size 3, Patella size 29 3-peg oval button, with a 9 mm polyethylene insert.   PREOPERATIVE INDICATIONS:  Erika Kim is a 74 y.o. year old female with end stage bone on bone degenerative arthritis of the knee who failed conservative treatment, including injections, antiinflammatories, activity modification, and assistive devices, and had significant impairment of their activities of daily living, and elected for Total Knee Arthroplasty.   The risks, benefits, and alternatives were discussed at length including but not limited to the risks of infection, bleeding, nerve injury, stiffness, blood clots, the need for revision surgery, cardiopulmonary complications, among others, and they were willing to proceed.   OPERATIVE DESCRIPTION:  The patient was brought to the operative room and placed in a supine position.  General anesthesia was administered.  IV antibiotics were given.  The lower extremity was prepped and draped in the usual sterile fashion.  Time out was performed.  The leg was elevated and exsanguinated and the tourniquet was inflated.  Anterior approach was performed.  The patella was everted and osteophytes were removed.  The anterior horn of the medial and lateral meniscus was removed.   The distal femur was opened with the drill and the intramedullary distal femoral cutting jig was utilized, set at 5 degrees resecting 8 mm off the distal femur.  Care was taken to  protect the collateral ligaments.  The distal femoral sizing jig was applied, taking care to avoid notching.  Then the 4-in-1 cutting jig was applied and the anterior and posterior femur was cut, along with the chamfer cuts.  All posterior osteophytes were removed.  The flexion gap was then measured and was symmetric with the extension gap.  Then the extramedullary tibial cutting jig was utilized making the appropriate cut using the anterior tibial crest as a reference building in appropriate posterior slope.  Care was taken during the cut to protect the medial and collateral ligaments.  The proximal tibia was removed along with the posterior horns of the menisci.  The PCL was sacrificed.    The extensor gap was measured and was approximately 57mm.    I completed the distal femoral preparation using the appropriate jig to prepare the box.  The patella was then measured, and cut with the saw.    The proximal tibia sized and prepared accordingly with the reamer and the punch, and then all components were trialed with the above sized poly insert.  The knee was found to have excellent balance and full motion.    The above named components were then impacted into place and Poly tibial piece and patella were inserted.  I was very happy with his stability and ROM  I performed a periarticular injection with marcaine and toradol  The knee was easily taken through a range of motion and the patella tracked well and the knee irrigated copiously and the parapatellar and subcutaneous tissue closed with vicryl, and monocryl with steri strips for the skin.  The  incision was dressed with sterile gauze and the tourniquet released and the patient was awakened and returned to the PACU in stable and satisfactory condition.  There were no complications.  Total tourniquet time was roughly 75 minutes.   POSTOPERATIVE PLAN: post op Abx, DVT px: SCD's, TED's, Early ambulation and chemical px

## 2020-03-02 NOTE — Evaluation (Signed)
Physical Therapy Evaluation Patient Details Name: Erika Kim MRN: [id number] DOB: 1945-12-18 Today's Date: 03/02/2020   History of Present Illness  L TKA  Clinical Impression  Pt is s/p TKA resulting in the deficits listed below (see PT Problem List). Min assist for bed mobility and transfers. Pt ambulated 5' with RW from bedside commode to recliner, distance limited by L knee pain. Good progress expected.  Pt will benefit from skilled PT to increase their independence and safety with mobility to allow discharge to the venue listed below.      Follow Up Recommendations Follow surgeon's recommendation for DC plan and follow-up therapies    Equipment Recommendations  None recommended by PT    Recommendations for Other Services       Precautions / Restrictions Precautions Precautions: Knee Precaution Comments: reviewed no pillow under knee Restrictions Weight Bearing Restrictions: No Other Position/Activity Restrictions: WBAT      Mobility  Bed Mobility Overal bed mobility: Needs Assistance Bed Mobility: Supine to Sit     Supine to sit: Min assist     General bed mobility comments: min A to support LLE and raise trunk  Transfers Overall transfer level: Needs assistance Equipment used: Rolling walker (2 wheeled) Transfers: Sit to/from [facility] Sit to Stand: Min assist Stand pivot transfers: Min assist       General transfer comment: VCs hand placment, min A to rise and steady  Ambulation/Gait Ambulation/Gait assistance: Min guard Gait Distance (Feet): 5 Feet Assistive device: Rolling walker (2 wheeled) Gait Pattern/deviations: Step-to pattern Gait velocity: decr   General Gait Details: VCs sequencing, no loss of balance, distance limited by pain  Stairs            Wheelchair Mobility    Modified Rankin (Stroke Patients Only)       Balance Overall balance assessment: Needs assistance Sitting-balance support: Feet  supported;No upper extremity supported Sitting balance-Leahy Scale: Fair     Standing balance support: Bilateral upper extremity supported Standing balance-Leahy Scale: Poor Standing balance comment: reliant upon BUE support                             Pertinent Vitals/Pain Pain Assessment: 0-10 Pain Score: 8  Pain Location: L knee Pain Descriptors / Indicators: Sore Pain Intervention(s): Limited activity within patient's tolerance;Monitored during session;Premedicated before session;Ice applied    Home Living Family/patient expects to be discharged to:: Private residence Living Arrangements: Spouse/significant other Available Help at Discharge: Family;Available 24 hours/day Type of Home: House Home Access: Stairs to enter Entrance Stairs-Rails: Chemical engineer of Steps: 4 Home Layout: Able to live on main level with bedroom/bathroom Home Equipment: Walker - 2 wheels      Prior Function Level of Independence: Independent               Hand Dominance        Extremity/Trunk Assessment   Upper Extremity Assessment Upper Extremity Assessment: Overall WFL for tasks assessed    Lower Extremity Assessment Lower Extremity Assessment: LLE deficits/detail LLE Deficits / Details: 50-45* AAROM L knee, SLR 2/5 LLE Sensation: WNL LLE Coordination: WNL    Cervical / Trunk Assessment Cervical / Trunk Assessment: Normal  Communication   Communication: No difficulties  Cognition Arousal/Alertness: Awake/alert Behavior During Therapy: WFL for tasks assessed/performed Overall Cognitive Status: Within Functional Limits for tasks assessed  General Comments      Exercises Total Joint Exercises Ankle Circles/Pumps: AROM;Both;10 reps   Assessment/Plan    PT Assessment Patient needs continued PT services  PT Problem List Decreased strength;Decreased range of motion;Decreased activity  tolerance;Decreased balance;Pain;Decreased mobility       PT Treatment Interventions Gait training;Stair training;Functional mobility training;Therapeutic exercise;Therapeutic activities;Patient/family education    PT Goals (Current goals can be found in the Care Plan section)  Acute Erika PT Goals Patient Stated Goal: to walk PT Goal Formulation: With patient Time For Goal Achievement: 03/09/20 Potential to Achieve Goals: Good    Frequency 7X/week   Barriers to discharge        Co-evaluation               AM-PAC PT "6 Clicks" Mobility  Outcome Measure Help needed turning from your back to your side while in a flat bed without using bedrails?: A Little Help needed moving from lying on your back to sitting on the side of a flat bed without using bedrails?: A Lot Help needed moving to and from a bed to a chair (including a wheelchair)?: A Little Help needed standing up from a chair using your arms (e.g., wheelchair or bedside chair)?: A Little Help needed to walk in hospital room?: A Lot Help needed climbing 3-5 steps with a railing? : A Lot 6 Click Score: 15    End of Session Equipment Utilized During Treatment: Gait belt Activity Tolerance: Patient limited by pain;Patient limited by fatigue Patient left: in chair;with call bell/phone within reach;with chair alarm set Nurse Communication: Mobility status PT Visit Diagnosis: Pain;Muscle weakness (generalized) (M62.81);Difficulty in walking, not elsewhere classified (R26.2) Pain - Right/Left: Left Pain - part of body: Knee    Time: [id number] PT Time Calculation (min) (ACUTE ONLY): 28 min   Charges:   PT Evaluation $PT Eval Low Complexity: 1 Low PT Treatments $Therapeutic Activity: 8-22 mins       Blondell Reveal Kistler PT 03/02/2020  Acute Rehabilitation Services Pager [phone] Office [phone]

## 2020-03-02 NOTE — Interval H&P Note (Signed)
I participated in the care of this patient and agree with the above history, physical and evaluation. I performed a review of the history and a physical exam as detailed   Clarine Elrod Daniel Jamaya Sleeth MD  

## 2020-03-02 NOTE — Anesthesia Procedure Notes (Signed)
Anesthesia Regional Block: Adductor canal block   Pre-Anesthetic Checklist: ,, timeout performed, Correct Patient, Correct Site, Correct Laterality, Correct Procedure, Correct Position, site marked, Risks and benefits discussed,  Surgical consent,  Pre-op evaluation,  At surgeon's request and post-op pain management  Laterality: Left  Prep: Maximum Sterile Barrier Precautions used, chloraprep       Needles:  Injection technique: Single-shot  Needle Type: Echogenic Stimulator Needle     Needle Length: 9cm  Needle Gauge: 22     Additional Needles:   Procedures:,,,, ultrasound used (permanent image in chart),,,,  Narrative:  Start time: 03/02/2020 12:18 PM End time: 03/02/2020 12:28 PM Injection made incrementally with aspirations every 5 mL.  Performed by: Personally  Anesthesiologist: Freddrick March, MD  Additional Notes: Monitors applied. No increased pain on injection. No increased resistance to injection. Injection made in 5cc increments. Good needle visualization. Patient tolerated procedure well.

## 2020-03-02 NOTE — Progress Notes (Signed)
AssistedDr. Hulan Fray with left, ultrasound guided, adductor canal block. Side rails up, monitors on throughout procedure. See vital signs in flow sheet. Tolerated Procedure well.

## 2020-03-02 NOTE — Discharge Instructions (Signed)

## 2020-03-02 NOTE — Transfer of Care (Signed)
Immediate Anesthesia Transfer of Care Note  Patient: Erika Kim  Procedure(s) Performed: TOTAL KNEE ARTHROPLASTY (Left Knee)  Patient Location: PACU  Anesthesia Type:GA combined with regional for post-op pain  Level of Consciousness: drowsy and patient cooperative  Airway & Oxygen Therapy: Patient Spontanous Breathing and Patient connected to face mask oxygen  Post-op Assessment: Report given to RN and Post -op Vital signs reviewed and stable  Post vital signs: Reviewed and stable  Last Vitals:  Vitals Value Taken Time  BP 171/74 03/02/20 1501  Temp    Pulse 76 03/02/20 1503  Resp 20 03/02/20 1503  SpO2 99 % 03/02/20 1503  Vitals shown include unvalidated device data.  Last Pain:  Vitals:   03/02/20 1109  TempSrc:   PainSc: 0-No pain      Patients Stated Pain Goal: 4 ([id number])  Complications: No apparent anesthesia complications

## 2020-03-03 ENCOUNTER — Encounter: Payer: Self-pay | Admitting: *Deleted

## 2020-03-03 DIAGNOSIS — M1712 Unilateral primary osteoarthritis, left knee: Secondary | ICD-10-CM | POA: Diagnosis not present

## 2020-03-03 LAB — GLUCOSE, CAPILLARY
Glucose-Capillary: 111 mg/dL — ABNORMAL HIGH (ref 70–99)
Glucose-Capillary: 159 mg/dL — ABNORMAL HIGH (ref 70–99)

## 2020-03-03 NOTE — Progress Notes (Signed)
Physical Therapy Treatment Patient Details Name: Erika Kim MRN: [id number] DOB: [birth year] Today's Date: 03/03/2020    History of Present Illness L TKA    PT Comments    POD # 1 am session Assisted OOB.  General bed mobility comments: demonstrated and instructed how to use a belt to self assist LE.  Assisted to bathroom. General transfer comment: VCs hand placment, min A to rise and steady.  Assisted with amb in hallway.  General Gait Details: VCs sequencing, no loss of balance, distance limited by pain.  Then returned to room to perform some TE's following HEP handout.  Instructed on proper tech, freq as well as use of ICE.   Pt will need another PT session to address stairs.     Follow Up Recommendations  Follow surgeon's recommendation for DC plan and follow-up therapies;Home health PT     Equipment Recommendations  None recommended by PT    Recommendations for Other Services       Precautions / Restrictions Precautions Precautions: Knee Precaution Comments: reviewed no pillow under knee Restrictions Weight Bearing Restrictions: No Other Position/Activity Restrictions: WBAT    Mobility  Bed Mobility Overal bed mobility: Needs Assistance Bed Mobility: Supine to Sit     Supine to sit: Supervision;Min guard     General bed mobility comments: demonstrated and instructed how to use a belt to self assist LE  Transfers Overall transfer level: Needs assistance Equipment used: Rolling walker (2 wheeled) Transfers: Sit to/from [employer] Transfers Sit to Stand: Supervision Stand pivot transfers: Supervision;Min guard       General transfer comment: VCs hand placment, min A to rise and steady  Ambulation/Gait Ambulation/Gait assistance: Supervision;Min guard Gait Distance (Feet): 30 Feet Assistive device: Rolling walker (2 wheeled) Gait Pattern/deviations: Step-to pattern Gait velocity: decr   General Gait Details: VCs sequencing, no loss of balance,  distance limited by pain   Stairs             Wheelchair Mobility    Modified Rankin (Stroke Patients Only)       Balance                                            Cognition Arousal/Alertness: Awake/alert Behavior During Therapy: WFL for tasks assessed/performed Overall Cognitive Status: Within Functional Limits for tasks assessed                                        Exercises   Total Knee Replacement TE's following HEP handout 10 reps B LE ankle pumps 05 reps towel squeezes 05 reps knee presses 05 reps heel slides  05 reps SAQ's 05 reps SLR's 05 reps ABD Educated on use of gait belt to assist with TE's Followed by ICE     General Comments        Pertinent Vitals/Pain Pain Assessment: 0-10 Pain Score: 3  Pain Location: L knee Pain Descriptors / Indicators: Sore Pain Intervention(s): Monitored during session;Premedicated before session;Repositioned;Ice applied    Home Living                      Prior Function            PT Goals (current goals can now be found in the care plan section)  Progress towards PT goals: Progressing toward goals    Frequency    7X/week      PT Plan Current plan remains appropriate    Co-evaluation              AM-PAC PT "6 Clicks" Mobility   Outcome Measure  Help needed turning from your back to your side while in a flat bed without using bedrails?: A Little Help needed moving from lying on your back to sitting on the side of a flat bed without using bedrails?: A Little Help needed moving to and from a bed to a chair (including a wheelchair)?: A Little Help needed standing up from a chair using your arms (e.g., wheelchair or bedside chair)?: A Little Help needed to walk in hospital room?: A Little Help needed climbing 3-5 steps with a railing? : A Lot 6 Click Score: 17    End of Session Equipment Utilized During Treatment: Gait belt Activity Tolerance:  Patient tolerated treatment well Patient left: in chair;with call bell/phone within reach Nurse Communication: Mobility status PT Visit Diagnosis: Pain;Muscle weakness (generalized) (M62.81);Difficulty in walking, not elsewhere classified (R26.2) Pain - Right/Left: Left Pain - part of body: Knee     Time: [id number] PT Time Calculation (min) (ACUTE ONLY): 26 min  Charges:  $Gait Training: 8-22 mins $Therapeutic Exercise: 8-22 mins                     Rica Koyanagi  PTA Acute  Rehabilitation Services Pager      [phone] Office      [phone]

## 2020-03-03 NOTE — Discharge Summary (Signed)
Discharge Summary  Patient ID: Erika Kim MRN: [id number] DOB/AGE: [birth year] 72 y.o.  Admit date: 03/02/2020 Discharge date: 03/03/2020  Admission Diagnoses:  Primary osteoarthritis of left knee  Discharge Diagnoses:  Principal Problem:   Primary osteoarthritis of left knee Active Problems:   Paroxysmal atrial fibrillation (HCC)   Diabetes (HCC)   Chronic anticoagulation   Atrial fibrillation (HCC)   Past Medical History:  Diagnosis Date  . Acoustic neuroma ([place]) followed by dr brown at [facility]   right side w/ chronic roaring noise  . Anemia    hx of 2019 realted to Xarelto   . Anticoagulant long-term use    xarelto  . Benign paroxysmal positional vertigo   . Dysrhythmia    afib- 2 ablations followed by Dr Marlou Porch - lov - 4/21   . Endometrial polyp   . Family history of adverse reaction to anesthesia    daughter- nausea   . Frequency of urination   . Hx of transfusion of packed red blood cells    related to small intestine in 2019   . Hyperlipidemia   . Hypertension   . OA (osteoarthritis)    left knee  . OSA on CPAP    cpap - setting at ? 8   . PAF (paroxysmal atrial fibrillation) [facility]) cardiologist-  dr Marlou Porch   dx 04/ 2017  . PMB (postmenopausal bleeding)   . Pneumonia    walking pneumonia -age 20   . TMJ (temporomandibular joint disorder)    left side-- wears guard  . Type 2 diabetes mellitus ([place])   . Wears contact lenses     Surgeries: Procedure(s): TOTAL KNEE ARTHROPLASTY on 03/02/2020   Consultants (if any):   Discharged Condition: Improved  Hospital Course: Erika Kim is an 74 y.o. female who was admitted 03/02/2020 with a diagnosis of Primary osteoarthritis of left knee and went to the operating room on 03/02/2020 and underwent the above named procedures.     She was given perioperative antibiotics:  Anti-infectives (From admission, onward)   Start     Dose/Rate Route Frequency Ordered Stop   03/02/20 1800  ceFAZolin (ANCEF) IVPB 1  g/50 mL premix     1 g 100 mL/hr over 30 Minutes Intravenous Every 6 hours 03/02/20 1622 03/03/20 0032   03/02/20 1330  clindamycin (CLEOCIN) IVPB 900 mg     900 mg 100 mL/hr over 30 Minutes Intravenous On call to O.R. 03/02/20 1315 03/02/20 1345   03/02/20 1310  clindamycin (CLEOCIN) 900 MG/50ML IVPB    Note to Pharmacy: Nash Dimmer   : cabinet override      03/02/20 1310 03/02/20 1317   03/02/20 1245  ceFAZolin (ANCEF) 2-4 GM/100ML-% IVPB  Status:  Discontinued    Note to Pharmacy: Rae Mar   : cabinet override      03/02/20 1245 03/02/20 1331   03/02/20 1030  ceFAZolin (ANCEF) IVPB 2g/100 mL premix  Status:  Discontinued     2 g 200 mL/hr over 30 Minutes Intravenous On call to O.R. 03/02/20 1027 03/02/20 1315    .  She was given sequential compression devices, early ambulation, and will resume Xarelto for A. fib and for DVT prophylaxis.  She benefited maximally from the hospital stay and there were no complications.    Recent vital signs:  Vitals:   03/03/20 0011 03/03/20 0459  BP: (!) 151/69 (!) 148/59  Pulse: 63 69  Resp: 18 20  Temp: 97.7 F (36.5 C) 97.7 F (36.5 C)  SpO2: 96% 97%    Recent laboratory studies:  Lab Results  Component Value Date   HGB 14.9 02/23/2020   HGB 14.7 10/22/2019   HGB 15.7 (H) 06/20/2019   Lab Results  Component Value Date   WBC 6.5 02/23/2020   PLT 153 02/23/2020   Lab Results  Component Value Date   INR 1.53 01/10/2017   Lab Results  Component Value Date   NA 139 02/23/2020   K 4.1 02/23/2020   CL 101 02/23/2020   CO2 28 02/23/2020   BUN 13 02/23/2020   CREATININE 0.59 02/23/2020   GLUCOSE 88 02/23/2020    Discharge Medications:   Allergies as of 03/03/2020      Reactions   Augmentin [amoxicillin-pot Clavulanate] Itching   severe   Cephalosporins Itching   severe      Medication List    TAKE these medications   acetaminophen 500 MG tablet Commonly known as: TYLENOL Take 2 tablets (1,000 mg total) by  mouth every 8 (eight) hours for 10 days. For Pain. What changed:   when to take this  reasons to take this  additional instructions  Another medication with the same name was removed. Continue taking this medication, and follow the directions you see here.   atorvastatin 40 MG tablet Commonly known as: LIPITOR Take 40 mg by mouth every morning.   baclofen 10 MG tablet Commonly known as: LIORESAL Take 1 tablet (10 mg total) by mouth 3 (three) times daily as needed for muscle spasms.   cetirizine 10 MG tablet Commonly known as: ZYRTEC Take 10 mg by mouth daily as needed for allergies.   CRANBERRY CONCENTRATE/VITAMINC PO Take 1 tablet by mouth daily.   docusate sodium 100 MG capsule Commonly known as: COLACE Take 100 mg by mouth daily.   hydrochlorothiazide 12.5 MG tablet Commonly known as: HYDRODIURIL Take 12.5 mg by mouth daily.   ipratropium 0.03 % nasal spray Commonly known as: ATROVENT Place 2 sprays into both nostrils every 12 (twelve) hours as needed for rhinitis.   Januvia 100 MG tablet Generic drug: sitaGLIPtin Take 100 mg by mouth daily after supper.   metoprolol succinate 50 MG 24 hr tablet Commonly known as: TOPROL-XL Take 50 mg by mouth daily. Pt takes at supper time   multivitamin tablet Take 1 tablet by mouth daily.   olmesartan 20 MG tablet Commonly known as: BENICAR Take 20 mg by mouth daily.   ondansetron 4 MG tablet Commonly known as: Zofran Take 1 tablet (4 mg total) by mouth every 8 (eight) hours as needed for nausea or vomiting.   ONE TOUCH ULTRA TEST test strip Generic drug: glucose blood   OneTouch Delica Plus 0000000 Misc   oxyCODONE 5 MG immediate release tablet Commonly known as: Roxicodone Take 1 tablet (5 mg total) by mouth every 4 (four) hours as needed for up to 7 days for breakthrough pain.   saline Gel Place 1 application into the nose at bedtime.   SYSTANE OP Place 1 drop into both eyes daily as needed (dry  eyes).   Vitamin D3 125 MCG (5000 UT) Tabs Take 5,000 Units by mouth daily.   Xarelto 20 MG Tabs tablet Generic drug: rivaroxaban TAKE ONE TABLET BY MOUTH ONE TIME DAILY with supper What changed: See the new instructions.       Diagnostic Studies: DG Knee Left Port  Result Date: 03/02/2020 CLINICAL DATA:  Left knee replacement EXAM: PORTABLE LEFT KNEE - 1-2 VIEW COMPARISON:  None. FINDINGS: Changes  of left knee replacement. No hardware bony complicating feature. Soft tissue and joint space gas present. IMPRESSION: Left knee replacement.  No visible complicating feature. Electronically Signed   By: Rolm Baptise M.D.   On: 03/02/2020 15:34    Disposition: Discharge disposition: 01-Home or Self Care       Discharge Instructions    Discharge patient   Complete by: As directed    After cleared by therapy   Discharge disposition: 01-Home or Self Care   Discharge patient date: 03/03/2020      Follow-up Information    Renette Butters, MD. Go on 03/17/2020.   Specialty: Orthopedic Surgery Why: Your appointment has been scheduled for 4:00.  Contact information: [address] [place] [address] [phone]        [facility] Follow up.   Specialty: [place] Why: You will be seen at home by [place] for 5 visits prior to starting outpatient physical therapy  Contact information: [address] [place] [address] [phone]        [facility] Go on 03/17/2020.   Why: You are scheduled to start outpatient physical therapy at 3:00. Please arrive at 2:30 to complete your paperwork  Contact information: [facility] [place] [address] [phone]        Renette Butters, MD.   Specialty: Orthopedic Surgery Contact information: [address] [place] [address] [phone]            Signed: Prudencio Burly III PA-C 03/03/2020, 7:52 AM

## 2020-03-03 NOTE — Progress Notes (Signed)
    Subjective: Patient reports pain as mild.  Tolerating diet.  Urinating.  No CP, SOB.  Good early mobilization.  Objective:   VITALS:   Vitals:   03/02/20 1943 03/02/20 2122 03/03/20 0011 03/03/20 0459  BP: (!) 148/59  (!) 151/69 (!) 148/59  Pulse: 65 66 63 69  Resp: 17 18 18 20   Temp: 97.7 F (36.5 C)  97.7 F (36.5 C) 97.7 F (36.5 C)  TempSrc: Oral     SpO2: 95% 96% 96% 97%  Weight:      Height:       CBC Latest Ref Rng & Units 02/23/2020 10/22/2019 06/20/2019  WBC 4.0 - 10.5 K/uL 6.5 7.3 6.2  Hemoglobin 12.0 - 15.0 g/dL 14.9 14.7 15.7(H)  Hematocrit 36.0 - 46.0 % 44.0 42.5 46.7(H)  Platelets 150 - 400 K/uL 153 159 174   BMP Latest Ref Rng & Units 02/23/2020 10/22/2019 06/20/2019  Glucose 70 - 99 mg/dL 88 114(H) 96  BUN 8 - 23 mg/dL 13 13 14   Creatinine 0.44 - 1.00 mg/dL 0.59 0.69 0.68  BUN/Creat Ratio 12 - 28 - 19 -  Sodium 135 - 145 mmol/L 139 140 136  Potassium 3.5 - 5.1 mmol/L 4.1 4.2 4.3  Chloride 98 - 111 mmol/L 101 98 102  CO2 22 - 32 mmol/L 28 27 21(L)  Calcium 8.9 - 10.3 mg/dL 9.7 10.0 9.7   Intake/Output      05/11 0701 - 05/12 0700 05/12 0701 - 05/13 0700   P.O. 360    I.V. (mL/kg) 1711.4 (17.6)    IV Piggyback 100    Total Intake(mL/kg) 2171.4 (22.4)    Urine (mL/kg/hr) 1300    Blood 100    Total Output 1400    Net +771.4         Urine Occurrence 3 x       Physical Exam: General: NAD.  Upright in bed.  Calm, conversant. Resp: No increased wob Cardio: regular rate  ABD soft Neurologically intact MSK Neurovascularly intact Sensation intact distally Feet warm Dorsiflexion/Plantar flexion intact Incision: dressing C/D/I   Assessment: 1 Day Post-Op  S/P Procedure(s) (LRB): TOTAL KNEE ARTHROPLASTY (Left) by Dr. Ernesta Amble. Percell Miller on 03/02/2020  Principal Problem:   Primary osteoarthritis of left knee Active Problems:   Paroxysmal atrial fibrillation (HCC)   Diabetes (HCC)   Chronic anticoagulation   Atrial fibrillation (HCC)    Primary osteoarthritis, status post left total knee arthroplasty Doing well postop day 1 Tolerating diet and voiding Pain controlled Good early mobilization-patient motivated to discharge today.  Plan: Up with therapy Incentive Spirometry Elevate and Apply ice CPM, bone foam  Weight Bearing: Weight Bearing as Tolerated (WBAT) LLE Dressings: Maintain Mepilex.  Please ensure thigh high TED hose are applied to operative leg prior to discharge. VTE prophylaxis: We will resume Xarelto today, SCDs, ambulation Dispo: Home today after therapies    Patient's anticipated LOS is less than 2 midnights, meeting these requirements: - Lives within 1 hour of care - Has a competent adult at home to recover with post-op recover - NO history of  - Chronic pain requiring opiods  - Heart failure  - Heart attack  - Stroke  - DVT/VTE  - Respiratory Failure/COPD  - Renal failure  - Anemia  - Advanced Liver disease    Prudencio Burly III, PA-C 03/03/2020, 7:48 AM

## 2020-03-03 NOTE — Progress Notes (Signed)
Physical Therapy Treatment Patient Details Name: Erika Kim MRN: [id number] DOB: 1946-04-29 Today's Date: 03/03/2020    History of Present Illness L TKA    PT Comments    POD # 1 pm session Spouse present for education.  Assisted out of recliner to amb an increased diatnce in hallway, practice stairs and review TE's.  Addressed all mobility questions, discussed appropriate activity, educated on use of ICE.  Pt ready for D/C to home.   Follow Up Recommendations  Follow surgeon's recommendation for DC plan and follow-up therapies;Home health PT     Equipment Recommendations  None recommended by PT    Recommendations for Other Services       Precautions / Restrictions Precautions Precautions: Knee Precaution Comments: reviewed no pillow under knee Restrictions Weight Bearing Restrictions: No Other Position/Activity Restrictions: WBAT    Mobility  Bed Mobility   General bed mobility comments: OOB in recliner  Transfers Overall transfer level: Needs assistance Equipment used: Rolling walker (2 wheeled) Transfers: Sit to/from [facility] Sit to Stand: Supervision Stand pivot transfers: Supervision;Min guard       General transfer comment: VCs hand placment, min A to rise and steady  Ambulation/Gait Ambulation/Gait assistance: Supervision;Min guard Gait Distance (Feet): 44 Feet Assistive device: Rolling walker (2 wheeled) Gait Pattern/deviations: Step-to pattern Gait velocity: decr   General Gait Details: VCs sequencing, no loss of balance, distance limited by pain   Stairs Stairs: Yes Stairs assistance: Supervision;Min guard Stair Management: Step to pattern;Forwards;Two rails Number of Stairs: 2 General stair comments: with spouse present for education on safe handling   Wheelchair Mobility    Modified Rankin (Stroke Patients Only)       Balance                                            Cognition  Arousal/Alertness: Awake/alert Behavior During Therapy: WFL for tasks assessed/performed Overall Cognitive Status: Within Functional Limits for tasks assessed                                        Exercises      General Comments        Pertinent Vitals/Pain Pain Assessment: 0-10 Pain Score: 3  Pain Location: L knee Pain Descriptors / Indicators: Sore Pain Intervention(s): Monitored during session;Premedicated before session;Repositioned;Ice applied    Home Living                      Prior Function            PT Goals (current goals can now be found in the care plan section) Progress towards PT goals: Progressing toward goals    Frequency    7X/week      PT Plan Current plan remains appropriate    Co-evaluation              AM-PAC PT "6 Clicks" Mobility   Outcome Measure  Help needed turning from your back to your side while in a flat bed without using bedrails?: A Little Help needed moving from lying on your back to sitting on the side of a flat bed without using bedrails?: A Little Help needed moving to and from a bed to a chair (including a wheelchair)?: A Little Help needed standing  up from a chair using your arms (e.g., wheelchair or bedside chair)?: A Little Help needed to walk in hospital room?: A Little Help needed climbing 3-5 steps with a railing? : A Lot 6 Click Score: 17    End of Session Equipment Utilized During Treatment: Gait belt Activity Tolerance: Patient tolerated treatment well Patient left: in chair;with call bell/phone within reach Nurse Communication: Mobility status PT Visit Diagnosis: Pain;Muscle weakness (generalized) (M62.81);Difficulty in walking, not elsewhere classified (R26.2) Pain - Right/Left: Left Pain - part of body: Knee     Time: [id number] PT Time Calculation (min) (ACUTE ONLY): 13 min  Charges:  $Gait Training: 8-22 mins                      Rica Koyanagi  PTA Acute   Rehabilitation Services Pager      [phone] Office      [phone]

## 2020-03-03 NOTE — TOC Transition Note (Signed)
Transition of Care [facility]) - CM/SW Discharge Note   Patient Details  Name: Erika Kim MRN: [id number] Date of Birth: [birth year]  Transition of Care [facility]) CM/SW Contact:  Lia Hopping, [place] Phone Number: 03/03/2020, 10:25 AM   Clinical Narrative:   Vanessa Barbara Plan of Care. Patient Knowledgeable.  Patient provided a copy of her Advance Directives "Living Will". CSW placed on chart to be scan by medical records.       Patient Goals and CMS Choice        Discharge Placement                       Discharge Plan and Services                DME Arranged: CPM DME Agency: [facility]       HH Arranged: PT [place] Agency: [facility]        Social Determinants of Health (SDOH) Interventions     Readmission Risk Interventions No flowsheet data found.

## 2020-03-03 NOTE — Plan of Care (Signed)
  Problem: Education: Goal: Knowledge of the prescribed therapeutic regimen will improve 03/03/2020 1506 by Hubert Azure, RN Outcome: Adequate for Discharge 03/03/2020 1458 by Hubert Azure, RN Outcome: Progressing   Problem: Activity: Goal: Ability to avoid complications of mobility impairment will improve 03/03/2020 1506 by Hubert Azure, RN Outcome: Adequate for Discharge 03/03/2020 1458 by Hubert Azure, RN Outcome: Progressing   Problem: Clinical Measurements: Goal: Postoperative complications will be avoided or minimized 03/03/2020 1506 by Hubert Azure, RN Outcome: Adequate for Discharge 03/03/2020 1458 by Hubert Azure, RN Outcome: Progressing   Problem: Pain Management: Goal: Pain level will decrease with appropriate interventions 03/03/2020 1506 by Hubert Azure, RN Outcome: Adequate for Discharge 03/03/2020 1458 by Hubert Azure, RN Outcome: Progressing   Problem: Skin Integrity: Goal: Will show signs of wound healing 03/03/2020 1506 by Hubert Azure, RN Outcome: Adequate for Discharge 03/03/2020 1458 by Hubert Azure, RN Outcome: Progressing   Problem: Elimination: Goal: Will not experience complications related to bowel motility 03/03/2020 1506 by Hubert Azure, RN Outcome: Adequate for Discharge 03/03/2020 1458 by Hubert Azure, RN Outcome: Progressing   Problem: Skin Integrity: Goal: Risk for impaired skin integrity will decrease 03/03/2020 1506 by Hubert Azure, RN Outcome: Adequate for Discharge 03/03/2020 1458 by Hubert Azure, RN Outcome: Progressing

## 2020-03-03 NOTE — Plan of Care (Signed)
  Problem: Education: Goal: Knowledge of the prescribed therapeutic regimen will improve Outcome: Progressing   Problem: Activity: Goal: Ability to avoid complications of mobility impairment will improve Outcome: Progressing   Problem: Clinical Measurements: Goal: Postoperative complications will be avoided or minimized Outcome: Progressing   Problem: Pain Management: Goal: Pain level will decrease with appropriate interventions Outcome: Progressing   Problem: Skin Integrity: Goal: Will show signs of wound healing Outcome: Progressing   Problem: Elimination: Goal: Will not experience complications related to bowel motility Outcome: Progressing   Problem: Skin Integrity: Goal: Risk for impaired skin integrity will decrease Outcome: Progressing

## 2020-03-03 NOTE — Plan of Care (Signed)
Plan of care reviewed and discussed with the patient. 

## 2020-03-04 DIAGNOSIS — D333 Benign neoplasm of cranial nerves: Secondary | ICD-10-CM | POA: Diagnosis not present

## 2020-03-04 DIAGNOSIS — D631 Anemia in chronic kidney disease: Secondary | ICD-10-CM | POA: Diagnosis not present

## 2020-03-04 DIAGNOSIS — E785 Hyperlipidemia, unspecified: Secondary | ICD-10-CM | POA: Diagnosis not present

## 2020-03-04 DIAGNOSIS — C541 Malignant neoplasm of endometrium: Secondary | ICD-10-CM | POA: Diagnosis not present

## 2020-03-04 DIAGNOSIS — H8110 Benign paroxysmal vertigo, unspecified ear: Secondary | ICD-10-CM | POA: Diagnosis not present

## 2020-03-04 DIAGNOSIS — E119 Type 2 diabetes mellitus without complications: Secondary | ICD-10-CM | POA: Diagnosis not present

## 2020-03-04 DIAGNOSIS — I483 Typical atrial flutter: Secondary | ICD-10-CM | POA: Diagnosis not present

## 2020-03-04 DIAGNOSIS — I10 Essential (primary) hypertension: Secondary | ICD-10-CM | POA: Diagnosis not present

## 2020-03-04 DIAGNOSIS — I480 Paroxysmal atrial fibrillation: Secondary | ICD-10-CM | POA: Diagnosis not present

## 2020-03-04 DIAGNOSIS — Z471 Aftercare following joint replacement surgery: Secondary | ICD-10-CM | POA: Diagnosis not present

## 2020-03-08 DIAGNOSIS — I10 Essential (primary) hypertension: Secondary | ICD-10-CM | POA: Diagnosis not present

## 2020-03-08 DIAGNOSIS — H8110 Benign paroxysmal vertigo, unspecified ear: Secondary | ICD-10-CM | POA: Diagnosis not present

## 2020-03-08 DIAGNOSIS — E119 Type 2 diabetes mellitus without complications: Secondary | ICD-10-CM | POA: Diagnosis not present

## 2020-03-08 DIAGNOSIS — D631 Anemia in chronic kidney disease: Secondary | ICD-10-CM | POA: Diagnosis not present

## 2020-03-08 DIAGNOSIS — C541 Malignant neoplasm of endometrium: Secondary | ICD-10-CM | POA: Diagnosis not present

## 2020-03-08 DIAGNOSIS — D333 Benign neoplasm of cranial nerves: Secondary | ICD-10-CM | POA: Diagnosis not present

## 2020-03-08 DIAGNOSIS — Z471 Aftercare following joint replacement surgery: Secondary | ICD-10-CM | POA: Diagnosis not present

## 2020-03-08 DIAGNOSIS — I483 Typical atrial flutter: Secondary | ICD-10-CM | POA: Diagnosis not present

## 2020-03-08 DIAGNOSIS — E785 Hyperlipidemia, unspecified: Secondary | ICD-10-CM | POA: Diagnosis not present

## 2020-03-08 DIAGNOSIS — I480 Paroxysmal atrial fibrillation: Secondary | ICD-10-CM | POA: Diagnosis not present

## 2020-03-10 DIAGNOSIS — D333 Benign neoplasm of cranial nerves: Secondary | ICD-10-CM | POA: Diagnosis not present

## 2020-03-10 DIAGNOSIS — I483 Typical atrial flutter: Secondary | ICD-10-CM | POA: Diagnosis not present

## 2020-03-10 DIAGNOSIS — I10 Essential (primary) hypertension: Secondary | ICD-10-CM | POA: Diagnosis not present

## 2020-03-10 DIAGNOSIS — E785 Hyperlipidemia, unspecified: Secondary | ICD-10-CM | POA: Diagnosis not present

## 2020-03-10 DIAGNOSIS — Z471 Aftercare following joint replacement surgery: Secondary | ICD-10-CM | POA: Diagnosis not present

## 2020-03-10 DIAGNOSIS — C541 Malignant neoplasm of endometrium: Secondary | ICD-10-CM | POA: Diagnosis not present

## 2020-03-10 DIAGNOSIS — E119 Type 2 diabetes mellitus without complications: Secondary | ICD-10-CM | POA: Diagnosis not present

## 2020-03-10 DIAGNOSIS — D631 Anemia in chronic kidney disease: Secondary | ICD-10-CM | POA: Diagnosis not present

## 2020-03-10 DIAGNOSIS — H8110 Benign paroxysmal vertigo, unspecified ear: Secondary | ICD-10-CM | POA: Diagnosis not present

## 2020-03-10 DIAGNOSIS — I480 Paroxysmal atrial fibrillation: Secondary | ICD-10-CM | POA: Diagnosis not present

## 2020-03-10 DIAGNOSIS — M1712 Unilateral primary osteoarthritis, left knee: Secondary | ICD-10-CM | POA: Diagnosis not present

## 2020-03-11 ENCOUNTER — Ambulatory Visit: Payer: Medicare HMO | Admitting: Podiatry

## 2020-03-14 DIAGNOSIS — C541 Malignant neoplasm of endometrium: Secondary | ICD-10-CM | POA: Diagnosis not present

## 2020-03-14 DIAGNOSIS — I483 Typical atrial flutter: Secondary | ICD-10-CM | POA: Diagnosis not present

## 2020-03-14 DIAGNOSIS — D631 Anemia in chronic kidney disease: Secondary | ICD-10-CM | POA: Diagnosis not present

## 2020-03-14 DIAGNOSIS — E785 Hyperlipidemia, unspecified: Secondary | ICD-10-CM | POA: Diagnosis not present

## 2020-03-14 DIAGNOSIS — Z471 Aftercare following joint replacement surgery: Secondary | ICD-10-CM | POA: Diagnosis not present

## 2020-03-14 DIAGNOSIS — I10 Essential (primary) hypertension: Secondary | ICD-10-CM | POA: Diagnosis not present

## 2020-03-14 DIAGNOSIS — H8110 Benign paroxysmal vertigo, unspecified ear: Secondary | ICD-10-CM | POA: Diagnosis not present

## 2020-03-14 DIAGNOSIS — I480 Paroxysmal atrial fibrillation: Secondary | ICD-10-CM | POA: Diagnosis not present

## 2020-03-14 DIAGNOSIS — E119 Type 2 diabetes mellitus without complications: Secondary | ICD-10-CM | POA: Diagnosis not present

## 2020-03-14 DIAGNOSIS — D333 Benign neoplasm of cranial nerves: Secondary | ICD-10-CM | POA: Diagnosis not present

## 2020-03-15 DIAGNOSIS — E785 Hyperlipidemia, unspecified: Secondary | ICD-10-CM | POA: Diagnosis not present

## 2020-03-15 DIAGNOSIS — E119 Type 2 diabetes mellitus without complications: Secondary | ICD-10-CM | POA: Diagnosis not present

## 2020-03-15 DIAGNOSIS — H8110 Benign paroxysmal vertigo, unspecified ear: Secondary | ICD-10-CM | POA: Diagnosis not present

## 2020-03-15 DIAGNOSIS — D631 Anemia in chronic kidney disease: Secondary | ICD-10-CM | POA: Diagnosis not present

## 2020-03-15 DIAGNOSIS — Z471 Aftercare following joint replacement surgery: Secondary | ICD-10-CM | POA: Diagnosis not present

## 2020-03-15 DIAGNOSIS — I483 Typical atrial flutter: Secondary | ICD-10-CM | POA: Diagnosis not present

## 2020-03-15 DIAGNOSIS — D333 Benign neoplasm of cranial nerves: Secondary | ICD-10-CM | POA: Diagnosis not present

## 2020-03-15 DIAGNOSIS — C541 Malignant neoplasm of endometrium: Secondary | ICD-10-CM | POA: Diagnosis not present

## 2020-03-15 DIAGNOSIS — I10 Essential (primary) hypertension: Secondary | ICD-10-CM | POA: Diagnosis not present

## 2020-03-15 DIAGNOSIS — I480 Paroxysmal atrial fibrillation: Secondary | ICD-10-CM | POA: Diagnosis not present

## 2020-03-17 DIAGNOSIS — M25562 Pain in left knee: Secondary | ICD-10-CM | POA: Diagnosis not present

## 2020-03-17 DIAGNOSIS — M1712 Unilateral primary osteoarthritis, left knee: Secondary | ICD-10-CM | POA: Diagnosis not present

## 2020-03-17 DIAGNOSIS — M6281 Muscle weakness (generalized): Secondary | ICD-10-CM | POA: Diagnosis not present

## 2020-03-17 DIAGNOSIS — M545 Low back pain: Secondary | ICD-10-CM | POA: Diagnosis not present

## 2020-03-17 DIAGNOSIS — R262 Difficulty in walking, not elsewhere classified: Secondary | ICD-10-CM | POA: Diagnosis not present

## 2020-03-17 DIAGNOSIS — M25662 Stiffness of left knee, not elsewhere classified: Secondary | ICD-10-CM | POA: Diagnosis not present

## 2020-03-24 DIAGNOSIS — M25662 Stiffness of left knee, not elsewhere classified: Secondary | ICD-10-CM | POA: Diagnosis not present

## 2020-03-24 DIAGNOSIS — R262 Difficulty in walking, not elsewhere classified: Secondary | ICD-10-CM | POA: Diagnosis not present

## 2020-03-24 DIAGNOSIS — M6281 Muscle weakness (generalized): Secondary | ICD-10-CM | POA: Diagnosis not present

## 2020-03-24 DIAGNOSIS — M25562 Pain in left knee: Secondary | ICD-10-CM | POA: Diagnosis not present

## 2020-03-29 DIAGNOSIS — M6281 Muscle weakness (generalized): Secondary | ICD-10-CM | POA: Diagnosis not present

## 2020-03-29 DIAGNOSIS — R262 Difficulty in walking, not elsewhere classified: Secondary | ICD-10-CM | POA: Diagnosis not present

## 2020-03-29 DIAGNOSIS — M25662 Stiffness of left knee, not elsewhere classified: Secondary | ICD-10-CM | POA: Diagnosis not present

## 2020-03-29 DIAGNOSIS — M25562 Pain in left knee: Secondary | ICD-10-CM | POA: Diagnosis not present

## 2020-03-30 DIAGNOSIS — R69 Illness, unspecified: Secondary | ICD-10-CM | POA: Diagnosis not present

## 2020-03-30 DIAGNOSIS — L728 Other follicular cysts of the skin and subcutaneous tissue: Secondary | ICD-10-CM | POA: Diagnosis not present

## 2020-03-30 DIAGNOSIS — L853 Xerosis cutis: Secondary | ICD-10-CM | POA: Diagnosis not present

## 2020-03-30 DIAGNOSIS — L814 Other melanin hyperpigmentation: Secondary | ICD-10-CM | POA: Diagnosis not present

## 2020-03-30 DIAGNOSIS — D1801 Hemangioma of skin and subcutaneous tissue: Secondary | ICD-10-CM | POA: Diagnosis not present

## 2020-03-30 DIAGNOSIS — L821 Other seborrheic keratosis: Secondary | ICD-10-CM | POA: Diagnosis not present

## 2020-03-30 DIAGNOSIS — L819 Disorder of pigmentation, unspecified: Secondary | ICD-10-CM | POA: Diagnosis not present

## 2020-03-30 DIAGNOSIS — D229 Melanocytic nevi, unspecified: Secondary | ICD-10-CM | POA: Diagnosis not present

## 2020-03-31 DIAGNOSIS — R262 Difficulty in walking, not elsewhere classified: Secondary | ICD-10-CM | POA: Diagnosis not present

## 2020-03-31 DIAGNOSIS — M25662 Stiffness of left knee, not elsewhere classified: Secondary | ICD-10-CM | POA: Diagnosis not present

## 2020-03-31 DIAGNOSIS — M6281 Muscle weakness (generalized): Secondary | ICD-10-CM | POA: Diagnosis not present

## 2020-03-31 DIAGNOSIS — M25562 Pain in left knee: Secondary | ICD-10-CM | POA: Diagnosis not present

## 2020-04-01 DIAGNOSIS — G4733 Obstructive sleep apnea (adult) (pediatric): Secondary | ICD-10-CM | POA: Diagnosis not present

## 2020-04-05 DIAGNOSIS — R262 Difficulty in walking, not elsewhere classified: Secondary | ICD-10-CM | POA: Diagnosis not present

## 2020-04-05 DIAGNOSIS — M6281 Muscle weakness (generalized): Secondary | ICD-10-CM | POA: Diagnosis not present

## 2020-04-05 DIAGNOSIS — M25562 Pain in left knee: Secondary | ICD-10-CM | POA: Diagnosis not present

## 2020-04-05 DIAGNOSIS — M25662 Stiffness of left knee, not elsewhere classified: Secondary | ICD-10-CM | POA: Diagnosis not present

## 2020-04-07 DIAGNOSIS — M25562 Pain in left knee: Secondary | ICD-10-CM | POA: Diagnosis not present

## 2020-04-07 DIAGNOSIS — M6281 Muscle weakness (generalized): Secondary | ICD-10-CM | POA: Diagnosis not present

## 2020-04-07 DIAGNOSIS — R262 Difficulty in walking, not elsewhere classified: Secondary | ICD-10-CM | POA: Diagnosis not present

## 2020-04-07 DIAGNOSIS — M25662 Stiffness of left knee, not elsewhere classified: Secondary | ICD-10-CM | POA: Diagnosis not present

## 2020-04-12 DIAGNOSIS — R262 Difficulty in walking, not elsewhere classified: Secondary | ICD-10-CM | POA: Diagnosis not present

## 2020-04-12 DIAGNOSIS — M25562 Pain in left knee: Secondary | ICD-10-CM | POA: Diagnosis not present

## 2020-04-12 DIAGNOSIS — M25662 Stiffness of left knee, not elsewhere classified: Secondary | ICD-10-CM | POA: Diagnosis not present

## 2020-04-12 DIAGNOSIS — M6281 Muscle weakness (generalized): Secondary | ICD-10-CM | POA: Diagnosis not present

## 2020-04-14 DIAGNOSIS — M25562 Pain in left knee: Secondary | ICD-10-CM | POA: Diagnosis not present

## 2020-04-21 DIAGNOSIS — M25562 Pain in left knee: Secondary | ICD-10-CM | POA: Diagnosis not present

## 2020-04-21 DIAGNOSIS — M6281 Muscle weakness (generalized): Secondary | ICD-10-CM | POA: Diagnosis not present

## 2020-04-21 DIAGNOSIS — R262 Difficulty in walking, not elsewhere classified: Secondary | ICD-10-CM | POA: Diagnosis not present

## 2020-04-21 DIAGNOSIS — M25662 Stiffness of left knee, not elsewhere classified: Secondary | ICD-10-CM | POA: Diagnosis not present

## 2020-04-28 DIAGNOSIS — M25662 Stiffness of left knee, not elsewhere classified: Secondary | ICD-10-CM | POA: Diagnosis not present

## 2020-04-28 DIAGNOSIS — M6281 Muscle weakness (generalized): Secondary | ICD-10-CM | POA: Diagnosis not present

## 2020-04-28 DIAGNOSIS — M25562 Pain in left knee: Secondary | ICD-10-CM | POA: Diagnosis not present

## 2020-04-28 DIAGNOSIS — R262 Difficulty in walking, not elsewhere classified: Secondary | ICD-10-CM | POA: Diagnosis not present

## 2020-05-05 DIAGNOSIS — M25662 Stiffness of left knee, not elsewhere classified: Secondary | ICD-10-CM | POA: Diagnosis not present

## 2020-05-05 DIAGNOSIS — R262 Difficulty in walking, not elsewhere classified: Secondary | ICD-10-CM | POA: Diagnosis not present

## 2020-05-05 DIAGNOSIS — M6281 Muscle weakness (generalized): Secondary | ICD-10-CM | POA: Diagnosis not present

## 2020-05-05 DIAGNOSIS — M25562 Pain in left knee: Secondary | ICD-10-CM | POA: Diagnosis not present

## 2020-05-10 ENCOUNTER — Telehealth: Payer: Self-pay | Admitting: *Deleted

## 2020-05-10 NOTE — Telephone Encounter (Signed)
   [facility] Pre-operative Risk Assessment    HEARTCARE STAFF: - Please ensure there is not already an duplicate clearance open for this procedure. - Under Visit Info/Reason for Call, type in Other and utilize the format Clearance MM/DD/YY or Clearance TBD. Do not use dashes or single digits. - If request is for dental extraction, please clarify the # of teeth to be extracted.  Request for surgical clearance:  1. What type of surgery is being performed? COLONOSCOPY   2. When is this surgery scheduled? 07/15/20   3. What type of clearance is required (medical clearance vs. Pharmacy clearance to hold med vs. Both)? BOTH  4. Are there any medications that need to be held prior to surgery and how long? XARELTO X 3 DAYS   5. Practice name and name of physician performing surgery? [facility]; DR. Therisa Doyne  6. What is the office phone number? [phone]   7.   What is the office fax number? [phone]  8.   Anesthesia type (None, local, MAC, general) ? PROPOFOL   Julaine Hua 05/10/2020, 10:25 AM  _________________________________________________________________   (provider comments below)

## 2020-05-10 NOTE — Telephone Encounter (Signed)
   Primary Cardiologist: [name], MD  Chart reviewed and patient called today as part of pre-operative protocol coverage. Given past medical history and time since last visit, based on ACC/AHA guidelines, BLIMIE VANESS would be at acceptable risk for the planned procedure without further cardiovascular testing.   Per our protocol OK to hold Xarelto two days pre op.  I spoke with the patient and gave her these recommendations.  I will route this recommendation to the requesting party via Epic fax function and remove from pre-op pool.  Please call with questions.  Kerin Ransom, PA-C 05/10/2020, 1:18 PM

## 2020-05-10 NOTE — Telephone Encounter (Signed)
Patient with diagnosis of afib on Xarelto for anticoagulation.    Procedure: colonoscopy Date of procedure: 07/15/20  CHADS2-VASc score of 4 (age, sex, HTN, DM). Underwent afib ablation 10/29/19.  CrCl >100mg /dL Platelet count 153K  Per office protocol, patient can hold Xarelto for 2 days prior to procedure, should not need 3 day hold for a colonoscopy.

## 2020-05-12 DIAGNOSIS — M25562 Pain in left knee: Secondary | ICD-10-CM | POA: Diagnosis not present

## 2020-05-12 DIAGNOSIS — M6281 Muscle weakness (generalized): Secondary | ICD-10-CM | POA: Diagnosis not present

## 2020-05-12 DIAGNOSIS — R262 Difficulty in walking, not elsewhere classified: Secondary | ICD-10-CM | POA: Diagnosis not present

## 2020-05-12 DIAGNOSIS — M25662 Stiffness of left knee, not elsewhere classified: Secondary | ICD-10-CM | POA: Diagnosis not present

## 2020-05-31 DIAGNOSIS — M5416 Radiculopathy, lumbar region: Secondary | ICD-10-CM | POA: Diagnosis not present

## 2020-05-31 DIAGNOSIS — R42 Dizziness and giddiness: Secondary | ICD-10-CM | POA: Diagnosis not present

## 2020-05-31 DIAGNOSIS — I480 Paroxysmal atrial fibrillation: Secondary | ICD-10-CM | POA: Diagnosis not present

## 2020-05-31 DIAGNOSIS — D333 Benign neoplasm of cranial nerves: Secondary | ICD-10-CM | POA: Diagnosis not present

## 2020-05-31 DIAGNOSIS — E1151 Type 2 diabetes mellitus with diabetic peripheral angiopathy without gangrene: Secondary | ICD-10-CM | POA: Diagnosis not present

## 2020-05-31 DIAGNOSIS — I739 Peripheral vascular disease, unspecified: Secondary | ICD-10-CM | POA: Diagnosis not present

## 2020-05-31 DIAGNOSIS — I10 Essential (primary) hypertension: Secondary | ICD-10-CM | POA: Diagnosis not present

## 2020-05-31 DIAGNOSIS — G4733 Obstructive sleep apnea (adult) (pediatric): Secondary | ICD-10-CM | POA: Diagnosis not present

## 2020-05-31 DIAGNOSIS — E785 Hyperlipidemia, unspecified: Secondary | ICD-10-CM | POA: Diagnosis not present

## 2020-06-18 DIAGNOSIS — K921 Melena: Secondary | ICD-10-CM | POA: Diagnosis not present

## 2020-06-21 ENCOUNTER — Encounter: Payer: Self-pay | Admitting: Podiatry

## 2020-06-21 ENCOUNTER — Ambulatory Visit (INDEPENDENT_AMBULATORY_CARE_PROVIDER_SITE_OTHER): Payer: Medicare HMO

## 2020-06-21 ENCOUNTER — Other Ambulatory Visit: Payer: Self-pay | Admitting: Podiatry

## 2020-06-21 ENCOUNTER — Other Ambulatory Visit: Payer: Self-pay

## 2020-06-21 ENCOUNTER — Ambulatory Visit (INDEPENDENT_AMBULATORY_CARE_PROVIDER_SITE_OTHER): Payer: Medicare HMO | Admitting: Podiatry

## 2020-06-21 DIAGNOSIS — M778 Other enthesopathies, not elsewhere classified: Secondary | ICD-10-CM

## 2020-06-21 DIAGNOSIS — M109 Gout, unspecified: Secondary | ICD-10-CM

## 2020-06-21 DIAGNOSIS — M7752 Other enthesopathy of left foot: Secondary | ICD-10-CM

## 2020-06-21 MED ORDER — METHYLPREDNISOLONE 4 MG PO TBPK: ORAL_TABLET | ORAL | 0 refills | Status: DC

## 2020-06-21 NOTE — Progress Notes (Signed)
She presents today after having woken up at 4:00 this morning with pain to the first metatarsophalangeal joint of her left foot.  She denies any trauma to the big toe states it is swollen red painful this morning she is taken to Tylenol arthritis strength and it has helped some but not much.  She states that she has never had gout before but does relate the fact that her son has horrible gout.  Objective: Vital signs stable alert oriented x3.  She has red hot swollen joint without cellulitic lines overlying the first metatarsophalangeal joint plantarly medially and dorsally.  She has tenderness on range of motion of the joint.  Radiographs demonstrate today joint space narrowing some osteoarthritic changes as well as soft tissue increased density along the medial aspect of the hypertrophic medial condyle consistent with tophi.  Assessment: Probably gouty arthritis first metatarsophalangeal joint.  Plan: At this point I injected the area today with 20 mg of Kenalog around the joint with local anesthetic.  I also started her on methylprednisolone since she is still taking the blood thinner did not want to use colchicine or indomethacin.  We are requesting blood work should this come back abnormal we will notify her.  Otherwise I will follow-up with her in about 3 weeks

## 2020-06-22 LAB — CBC WITH DIFFERENTIAL/PLATELET
Basophils Absolute: 0.1 10*3/uL (ref 0.0–0.2)
Basos: 1 %
EOS (ABSOLUTE): 0.2 10*3/uL (ref 0.0–0.4)
Eos: 2 %
Hematocrit: 43.1 % (ref 34.0–46.6)
Hemoglobin: 14.4 g/dL (ref 11.1–15.9)
Immature Grans (Abs): 0 10*3/uL (ref 0.0–0.1)
Immature Granulocytes: 1 %
Lymphocytes Absolute: 1.9 10*3/uL (ref 0.7–3.1)
Lymphs: 23 %
MCH: 29.2 pg (ref 26.6–33.0)
MCHC: 33.4 g/dL (ref 31.5–35.7)
MCV: 87 fL (ref 79–97)
Monocytes Absolute: 0.6 10*3/uL (ref 0.1–0.9)
Monocytes: 7 %
Neutrophils Absolute: 5.4 10*3/uL (ref 1.4–7.0)
Neutrophils: 66 %
Platelets: 180 10*3/uL (ref 150–450)
RBC: 4.93 x10E6/uL (ref 3.77–5.28)
RDW: 13.2 % (ref 11.7–15.4)
WBC: 8.1 10*3/uL (ref 3.4–10.8)

## 2020-06-22 LAB — SEDIMENTATION RATE: Sed Rate: 4 mm/hr (ref 0–40)

## 2020-06-22 LAB — RHEUMATOID FACTOR: Rheumatoid fact SerPl-aCnc: <10 IU/mL (ref 0.0–13.9)

## 2020-06-22 LAB — URIC ACID: Uric Acid: 7.2 mg/dL (ref 3.1–7.9)

## 2020-06-30 DIAGNOSIS — R69 Illness, unspecified: Secondary | ICD-10-CM | POA: Diagnosis not present

## 2020-07-02 ENCOUNTER — Telehealth: Payer: Self-pay

## 2020-07-02 NOTE — Telephone Encounter (Signed)
-----   Message from Garrel Ridgel, [place] sent at 06/22/2020  7:10 AM EDT ----- Blood work looks good now.  Looks like your uric acid may have been high and is now coming back down. Continue medication as directed.

## 2020-07-02 NOTE — Telephone Encounter (Signed)
Patient notified of results.

## 2020-07-12 DIAGNOSIS — Z6836 Body mass index (BMI) 36.0-36.9, adult: Secondary | ICD-10-CM | POA: Diagnosis not present

## 2020-07-12 DIAGNOSIS — Z779 Other contact with and (suspected) exposures hazardous to health: Secondary | ICD-10-CM | POA: Diagnosis not present

## 2020-07-12 DIAGNOSIS — Z1159 Encounter for screening for other viral diseases: Secondary | ICD-10-CM | POA: Diagnosis not present

## 2020-07-12 DIAGNOSIS — Z124 Encounter for screening for malignant neoplasm of cervix: Secondary | ICD-10-CM | POA: Diagnosis not present

## 2020-07-13 ENCOUNTER — Encounter: Payer: Self-pay | Admitting: Cardiology

## 2020-07-13 ENCOUNTER — Other Ambulatory Visit: Payer: Self-pay

## 2020-07-13 ENCOUNTER — Ambulatory Visit: Payer: Medicare HMO | Admitting: Cardiology

## 2020-07-13 VITALS — BP 130/72 | HR 60 | Ht 63.0 in | Wt 201.0 lb

## 2020-07-13 DIAGNOSIS — I480 Paroxysmal atrial fibrillation: Secondary | ICD-10-CM

## 2020-07-13 DIAGNOSIS — G4733 Obstructive sleep apnea (adult) (pediatric): Secondary | ICD-10-CM | POA: Diagnosis not present

## 2020-07-13 NOTE — Progress Notes (Signed)
Cardiology Office Note:    Date:  07/13/2020   ID:  Erika Kim, DOB June 22, 1946, MRN [id number]  PCP:  [name], MD  [facility] Cardiologist:  [name], MD  [facility] Electrophysiologist:  Will Meredith Leeds, MD   Referring MD: [name], MD    History of Present Illness:    Erika Kim is a 74 y.o. female here for the follow-up of atrial fibrillation.  She was seen by Dr. Curt Bears on 01/27/2020.  She had atrial fibrillation on 05/29/2019.  Returned with some atrial flutter and required cardioversion.  She had repeat ablation on 10/29/2019.  In Aug had palps briefly. Overall doing well. No return of AFIB. No CP, no SOB.     Past Medical History:  Diagnosis Date  . Acoustic neuroma ([place]) followed by dr brown at [facility]   right side w/ chronic roaring noise  . Anemia    hx of 2019 realted to Xarelto   . Anticoagulant long-term use    xarelto  . Benign paroxysmal positional vertigo   . Dysrhythmia    afib- 2 ablations followed by Dr Marlou Porch - lov - 4/21   . Endometrial polyp   . Family history of adverse reaction to anesthesia    daughter- nausea   . Frequency of urination   . Hx of transfusion of packed red blood cells    related to small intestine in 2019   . Hyperlipidemia   . Hypertension   . OA (osteoarthritis)    left knee  . OSA on CPAP    cpap - setting at ? 8   . PAF (paroxysmal atrial fibrillation) [facility]) cardiologist-  dr Marlou Porch   dx 04/ 2017  . PMB (postmenopausal bleeding)   . Pneumonia    walking pneumonia -age 66   . TMJ (temporomandibular joint disorder)    left side-- wears guard  . Type 2 diabetes mellitus ([place])   . Wears contact lenses     Past Surgical History:  Procedure Laterality Date  . ABDOMINAL HYSTERECTOMY    . ATRIAL FIBRILLATION ABLATION  05/29/2019  . ATRIAL FIBRILLATION ABLATION N/A 05/29/2019   Procedure: ATRIAL FIBRILLATION ABLATION;  Surgeon: [name], MD;  Location: [place] [facility];   Service: Cardiovascular;  Laterality: N/A;  . ATRIAL FIBRILLATION ABLATION N/A 10/29/2019   Procedure: ATRIAL FIBRILLATION ABLATION;  Surgeon: [name], MD;  Location: [place] [facility];  Service: Cardiovascular;  Laterality: N/A;  . CARDIOVERSION N/A 06/24/2019   Procedure: CARDIOVERSION;  Surgeon: Skeet Latch, MD;  Location: [place];  Service: Cardiovascular;  Laterality: N/A;  . CARPAL TUNNEL RELEASE Right 04/17/2007   w/ Excision ganglion cyst and Pulley Release right thumb  . DILATATION & CURETTAGE/HYSTEROSCOPY WITH MYOSURE N/A 01/12/2017   Procedure: DILATATION & CURETTAGE/HYSTEROSCOPY WITH MYOSURE;  Surgeon: Arvella Nigh, MD;  Location: [place];  Service: Gynecology;  Laterality: N/A;  . ESOPHAGOGASTRODUODENOSCOPY (EGD) WITH PROPOFOL N/A 09/16/2018   Procedure: ESOPHAGOGASTRODUODENOSCOPY (EGD) WITH PROPOFOL;  Surgeon: Ronald Lobo, MD;  Location: [place];  Service: Endoscopy;  Laterality: N/A;  . GIVENS CAPSULE STUDY N/A 09/16/2018   Procedure: GIVENS CAPSULE STUDY;  Surgeon: Ronald Lobo, MD;  Location: [place];  Service: Endoscopy;  Laterality: N/A;  . HAMMER TOE SURGERY Left 2010   left second toe  . IR RADIOLOGY PERIPHERAL GUIDED IV START  10/27/2019  . IR US GUIDE VASC ACCESS RIGHT  10/27/2019  . KNEE ARTHROSCOPY Right 2010  . KNEE SURGERY  Left 1993  . LYMPH NODE BIOPSY N/A 03/01/2017   Procedure: SENTINEL LYMPH NODE BIOPSY;  Surgeon: Everitt Amber, MD;  Location: [facility];  Service: Gynecology;  Laterality: N/A;  . NASAL SINUS SURGERY  1985 and 1995  . ROBOTIC ASSISTED TOTAL HYSTERECTOMY WITH BILATERAL SALPINGO OOPHERECTOMY Bilateral 03/01/2017   Procedure: XI ROBOTIC ASSISTED TOTAL HYSTERECTOMY WITH BILATERAL SALPINGO OOPHORECTOMY;  Surgeon: Everitt Amber, MD;  Location: [facility];  Service: Gynecology;  Laterality: Bilateral;  . ROTATOR CUFF REPAIR Right 04/2004  . TOE SURGERY Right 2012   Great toe and second toe  . TOTAL ELBOW  REPLACEMENT Right 2000  . TOTAL KNEE ARTHROPLASTY Right 01/12/2010  . TOTAL KNEE ARTHROPLASTY Left 03/02/2020   Procedure: TOTAL KNEE ARTHROPLASTY;  Surgeon: Renette Butters, MD;  Location: [facility];  Service: Orthopedics;  Laterality: Left;  . TRANSTHORACIC ECHOCARDIOGRAM  03/28/2016   ef 60-65%/ mild AR and MR  . TUBAL LIGATION Bilateral 1978    Current Medications: Current Meds  Medication Sig  . atorvastatin (LIPITOR) 40 MG tablet Take 40 mg by mouth every morning.   . betamethasone valerate (VALISONE) 0.1 % cream 2 (two) times a week.   . Cholecalciferol (VITAMIN D3) 5000 units TABS Take 5,000 Units by mouth daily.   . Cranberry-Vitamin C (CRANBERRY CONCENTRATE/VITAMINC PO) Take 1 tablet by mouth daily.   Marland Kitchen docusate sodium (COLACE) 100 MG capsule Take 100 mg by mouth daily.  . hydrochlorothiazide (HYDRODIURIL) 12.5 MG tablet Take 12.5 mg by mouth daily.   Marland Kitchen ipratropium (ATROVENT) 0.03 % nasal spray Place 2 sprays into both nostrils every 12 (twelve) hours as needed for rhinitis.  Marland Kitchen JANUVIA 100 MG tablet Take 100 mg by mouth daily after supper.   . Lancets (ONETOUCH DELICA PLUS [id number]) [place]   . metoprolol succinate (TOPROL-XL) 50 MG 24 hr tablet Take 50 mg by mouth daily. Pt takes at supper time  . Multiple Vitamin (MULTIVITAMIN) tablet Take 1 tablet by mouth daily.  Marland Kitchen olmesartan (BENICAR) 20 MG tablet Take 20 mg by mouth daily.  . ONE TOUCH ULTRA TEST test strip   . Polyethyl Glycol-Propyl Glycol (SYSTANE OP) Place 1 drop into both eyes daily as needed (dry eyes).  . saline (AYR) GEL Place 1 application into the nose at bedtime.  . triamcinolone cream (KENALOG) 0.1 % as needed for itching.  Alveda Reasons 20 MG TABS tablet TAKE ONE TABLET BY MOUTH ONE TIME DAILY with supper     Allergies:   Augmentin [amoxicillin-pot clavulanate] and Cephalosporins   Social History   Socioeconomic History  . Marital status: Married    Spouse name: Not on file  . Number of children: 2  . Years  of education: 21  . Highest education level: Not on file  Occupational History  . Not on file  Tobacco Use  . Smoking status: Never Smoker  . Smokeless tobacco: Never Used  Vaping Use  . Vaping Use: Never used  Substance and Sexual Activity  . Alcohol use: No  . Drug use: No  . Sexual activity: Not on file  Other Topics Concern  . Not on file  Social History Narrative   Married w/2 children (boy and girl).  Retired.   Social Determinants of Health   Financial Resource Strain:   . Difficulty of Paying Living Expenses: Not on file  Food Insecurity:   . Worried About Charity fundraiser in the Last Year: Not on file  . Ran Out of Food in the Last  Year: Not on file  Transportation Needs:   . Lack of Transportation (Medical): Not on file  . Lack of Transportation (Non-Medical): Not on file  Physical Activity:   . Days of Exercise per Week: Not on file  . Minutes of Exercise per Session: Not on file  Stress:   . Feeling of Stress : Not on file  Social Connections:   . Frequency of Communication with Friends and Family: Not on file  . Frequency of Social Gatherings with Friends and Family: Not on file  . Attends Religious Services: Not on file  . Active Member of Clubs or Organizations: Not on file  . Attends Archivist Meetings: Not on file  . Marital Status: Not on file     Family History: The patient's family history includes Heart failure in her father and mother.  ROS:   Please see the history of present illness.     All other systems reviewed and are negative.  EKGs/Labs/Other Studies Reviewed:    The following studies were reviewed today:  EF 60 to 65% on echocardiogram  EKG:  EKG is not ordered today.  She showed me EKG strip from her cardia device-sinus rhythm.  Excellent.  Personally reviewed.  Recent Labs: 02/23/2020: BUN 13; Creatinine, Ser 0.59; Potassium 4.1; Sodium 139 06/21/2020: Hemoglobin 14.4; Platelets 180  Recent Lipid Panel No results  found for: CHOL, TRIG, HDL, CHOLHDL, VLDL, LDLCALC, LDLDIRECT  Physical Exam:    VS:  BP 130/72   Pulse 60   Ht 5\' 3"  (1.6 m)   Wt 201 lb (91.2 kg)   SpO2 98%   BMI 35.61 kg/m     Wt Readings from Last 3 Encounters:  07/13/20 201 lb (91.2 kg)  03/02/20 214 lb (97.1 kg)  02/23/20 214 lb (97.1 kg)     GEN:  Well nourished, well developed in no acute distress HEENT: Normal NECK: No JVD; No carotid bruits LYMPHATICS: No lymphadenopathy CARDIAC: RRR, no murmurs, rubs, gallops RESPIRATORY:  Clear to auscultation without rales, wheezing or rhonchi  ABDOMEN: Soft, non-tender, non-distended MUSCULOSKELETAL:  No edema; No deformity  SKIN: Warm and dry NEUROLOGIC:  Alert and oriented x 3 PSYCHIATRIC:  Normal affect   ASSESSMENT:    1. Paroxysmal atrial fibrillation (HCC)   2. Morbid obesity ([place])   3. OSA (obstructive sleep apnea)    PLAN:    In order of problems listed above:  Paroxysmal atrial fibrillation/flutter -Repeat atrial fibrillation ablation on 10/29/2019.  CHA2DS2-VASc of 4.  Remained in sinus rhythm since ablation.  Her flecainide was stopped by Dr. Curt Bears on 01/27/2020.  He is continuing with the Toprol-XL in the evening.  -Doing very well.  No recurrence.  Obstructive sleep apnea -CPAP compliance continue to be encouraged.  She has noted some dried blood at times in her mouth.  This could be from dryness related to her CPAP and oral device that she is wearing.  Be careful to be on the look out for any tongue or oral lesions.  Essential hypertension -Currently well controlled on HCTZ metoprolol 50 olmesartan 20.  Diabetes with hypertension, -On Januvia, primary physician Dr. Sharlett Iles managing.  Last hemoglobin A1c 5.6.  Creatinine 0.59, LDL 83  Colonoscopy  -Okay to proceed with colonoscopy.  She may hold her Xarelto for 3 days prior.  She can start on the day after her colonoscopy.  Excellent job with weight loss, 15 pounds.  Continue with  activity.   Medication Adjustments/Labs and Tests Ordered: Current medicines  are reviewed at length with the patient today.  Concerns regarding medicines are outlined above.  No orders of the defined types were placed in this encounter.  No orders of the defined types were placed in this encounter.   Patient Instructions  Medication Instructions:  The current medical regimen is effective;  continue present plan and medications.  *If you need a refill on your cardiac medications before your next appointment, please call your pharmacy*  Follow-Up: At [facility], you and your health needs are our priority.  As part of our continuing mission to provide you with exceptional heart care, we have created designated Provider Care Teams.  These Care Teams include your primary Cardiologist (physician) and Advanced Practice Providers (APPs -  Physician Assistants and Nurse Practitioners) who all work together to provide you with the care you need, when you need it.  We recommend signing up for the patient portal called "MyChart".  Sign up information is provided on this After Visit Summary.  MyChart is used to connect with patients for Virtual Visits (Telemedicine).  Patients are able to view lab/test results, encounter notes, upcoming appointments, etc.  Non-urgent messages can be sent to your provider as well.   To learn more about what you can do with MyChart, go to [email or web address].    Your next appointment:   6 month(s)  The format for your next appointment:   In Person  Provider:   [name], MD   Thank you for choosing [facility]!!         Signed, [name], MD  07/13/2020 1:29 PM    [place]

## 2020-07-13 NOTE — Patient Instructions (Signed)

## 2020-07-14 ENCOUNTER — Ambulatory Visit: Payer: Medicare HMO | Admitting: Podiatry

## 2020-07-15 DIAGNOSIS — K625 Hemorrhage of anus and rectum: Secondary | ICD-10-CM | POA: Diagnosis not present

## 2020-07-15 DIAGNOSIS — K644 Residual hemorrhoidal skin tags: Secondary | ICD-10-CM | POA: Diagnosis not present

## 2020-07-15 DIAGNOSIS — K5730 Diverticulosis of large intestine without perforation or abscess without bleeding: Secondary | ICD-10-CM | POA: Diagnosis not present

## 2020-07-15 DIAGNOSIS — K648 Other hemorrhoids: Secondary | ICD-10-CM | POA: Diagnosis not present

## 2020-07-23 ENCOUNTER — Other Ambulatory Visit: Payer: Self-pay | Admitting: Cardiology

## 2020-07-24 DIAGNOSIS — Z23 Encounter for immunization: Secondary | ICD-10-CM | POA: Diagnosis not present

## 2020-07-26 NOTE — Telephone Encounter (Signed)
Prescription refill request for Xarelto received.   Last office visit: [place], 07/13/2020 Weight: 91.2 kg  Age: 74 y.o. Scr: 0.59, 02/23/2020 CrCl:  63 ml/min    Prescription refill sent.

## 2020-07-27 ENCOUNTER — Ambulatory Visit (INDEPENDENT_AMBULATORY_CARE_PROVIDER_SITE_OTHER): Payer: Medicare HMO | Admitting: Cardiology

## 2020-07-27 ENCOUNTER — Other Ambulatory Visit: Payer: Self-pay

## 2020-07-27 ENCOUNTER — Encounter: Payer: Self-pay | Admitting: Cardiology

## 2020-07-27 VITALS — BP 134/66 | HR 60 | Ht 63.0 in | Wt 204.2 lb

## 2020-07-27 DIAGNOSIS — I480 Paroxysmal atrial fibrillation: Secondary | ICD-10-CM | POA: Diagnosis not present

## 2020-07-27 NOTE — Progress Notes (Signed)
Electrophysiology Office Note   Date:  07/27/2020   ID:  Shamieka, Gullo 1946-06-11, MRN [id number]  PCP:  [name], MD  Cardiologist:  Marlou Porch Primary Electrophysiologist:  Delan Ksiazek Meredith Leeds, MD    No chief complaint on file.    History of Present Illness: Erika Kim is a 74 y.o. female who is being seen today for the evaluation of atrial fibrillation at the request of [name]. Presenting today for electrophysiology evaluation.  She has a history of severe for hypertension, hyperlipidemia, paroxysmal atrial fibrillation, diabetes, OSA on CPAP.  She is status post AF ablation 05/29/2019.  She returned with atrial flutter and required cardioversion.  She is now status post repeat ablation on 10/29/2019.  Today, denies symptoms of palpitations, chest pain, shortness of breath, orthopnea, PND, lower extremity edema, claudication, dizziness, presyncope, syncope, bleeding, or neurologic sequela. The patient is tolerating medications without difficulties.  Since last being seen she has done well.  She has had minimal episodes of atrial fibrillation.  Her only episode occurred in August and was short-lived.  She woke up in atrial fibrillation but quickly converted back to normal rhythm.  This was documented on her iPhone.   Past Medical History:  Diagnosis Date   Acoustic neuroma ([place]) followed by dr brown at [facility]   right side w/ chronic roaring noise   Anemia    hx of 2019 realted to Xarelto    Anticoagulant long-term use    xarelto   Benign paroxysmal positional vertigo    Dysrhythmia    afib- 2 ablations followed by Dr Marlou Porch - [facility] - 4/21    Endometrial polyp    Family history of adverse reaction to anesthesia    daughter- nausea    Frequency of urination    Hx of transfusion of packed red blood cells    related to small intestine in 2019    Hyperlipidemia    Hypertension    OA (osteoarthritis)    left knee   OSA on CPAP    cpap - setting  at ? 8    PAF (paroxysmal atrial fibrillation) [facility]) cardiologist-  dr Marlou Porch   dx 04/ 2017   PMB (postmenopausal bleeding)    Pneumonia    walking pneumonia -age 50    TMJ (temporomandibular joint disorder)    left side-- wears guard   Type 2 diabetes mellitus ([place])    Wears contact lenses    Past Surgical History:  Procedure Laterality Date   ABDOMINAL HYSTERECTOMY     ATRIAL FIBRILLATION ABLATION  05/29/2019   ATRIAL FIBRILLATION ABLATION N/A 05/29/2019   Procedure: ATRIAL FIBRILLATION ABLATION;  Surgeon: [name], MD;  Location: [place] [facility];  Service: Cardiovascular;  Laterality: N/A;   ATRIAL FIBRILLATION ABLATION N/A 10/29/2019   Procedure: ATRIAL FIBRILLATION ABLATION;  Surgeon: [name], MD;  Location: [place] [facility];  Service: Cardiovascular;  Laterality: N/A;   CARDIOVERSION N/A 06/24/2019   Procedure: CARDIOVERSION;  Surgeon: Skeet Latch, MD;  Location: [place];  Service: Cardiovascular;  Laterality: N/A;   CARPAL TUNNEL RELEASE Right 04/17/2007   w/ Excision ganglion cyst and Pulley Release right thumb   DILATATION & CURETTAGE/HYSTEROSCOPY WITH MYOSURE N/A 01/12/2017   Procedure: DILATATION & CURETTAGE/HYSTEROSCOPY WITH MYOSURE;  Surgeon: Arvella Nigh, MD;  Location: [place];  Service: Gynecology;  Laterality: N/A;   ESOPHAGOGASTRODUODENOSCOPY (EGD) WITH PROPOFOL N/A 09/16/2018   Procedure: ESOPHAGOGASTRODUODENOSCOPY (EGD) WITH PROPOFOL;  Surgeon: Ronald Lobo, MD;  Location: [facility];  Service: Endoscopy;  Laterality: N/A;   GIVENS CAPSULE STUDY N/A 09/16/2018   Procedure: GIVENS CAPSULE STUDY;  Surgeon: Ronald Lobo, MD;  Location: [place];  Service: Endoscopy;  Laterality: N/A;   HAMMER TOE SURGERY Left 2010   left second toe   IR RADIOLOGY PERIPHERAL GUIDED IV START  10/27/2019   IR US GUIDE VASC ACCESS RIGHT  10/27/2019   KNEE ARTHROSCOPY Right 2010   KNEE SURGERY Left 1993    LYMPH NODE BIOPSY N/A 03/01/2017   Procedure: SENTINEL LYMPH NODE BIOPSY;  Surgeon: Everitt Amber, MD;  Location: [facility];  Service: Gynecology;  Laterality: N/A;   NASAL SINUS SURGERY  1985 and 1995   ROBOTIC ASSISTED TOTAL HYSTERECTOMY WITH BILATERAL SALPINGO OOPHERECTOMY Bilateral 03/01/2017   Procedure: XI ROBOTIC ASSISTED TOTAL HYSTERECTOMY WITH BILATERAL SALPINGO OOPHORECTOMY;  Surgeon: Everitt Amber, MD;  Location: [facility];  Service: Gynecology;  Laterality: Bilateral;   ROTATOR CUFF REPAIR Right 04/2004   TOE SURGERY Right 2012   Great toe and second toe   TOTAL ELBOW REPLACEMENT Right 2000   TOTAL KNEE ARTHROPLASTY Right 01/12/2010   TOTAL KNEE ARTHROPLASTY Left 03/02/2020   Procedure: TOTAL KNEE ARTHROPLASTY;  Surgeon: Renette Butters, MD;  Location: [facility];  Service: Orthopedics;  Laterality: Left;   TRANSTHORACIC ECHOCARDIOGRAM  03/28/2016   ef 60-65%/ mild AR and MR   TUBAL LIGATION Bilateral 1978     Current Outpatient Medications  Medication Sig Dispense Refill   atorvastatin (LIPITOR) 40 MG tablet Take 40 mg by mouth every morning.      betamethasone valerate (VALISONE) 0.1 % cream 2 (two) times a week.      Cholecalciferol (VITAMIN D3) 5000 units TABS Take 5,000 Units by mouth daily.      Cranberry-Vitamin C (CRANBERRY CONCENTRATE/VITAMINC PO) Take 1 tablet by mouth daily.      docusate sodium (COLACE) 100 MG capsule Take 100 mg by mouth daily.     hydrochlorothiazide (HYDRODIURIL) 12.5 MG tablet Take 12.5 mg by mouth daily.      ipratropium (ATROVENT) 0.03 % nasal spray Place 2 sprays into both nostrils every 12 (twelve) hours as needed for rhinitis.     JANUVIA 100 MG tablet Take 100 mg by mouth daily after supper.      Lancets (ONETOUCH DELICA PLUS [id number]) MISC      metoprolol succinate (TOPROL-XL) 50 MG 24 hr tablet Take 50 mg by mouth daily. Pt takes at supper time     Multiple Vitamin (MULTIVITAMIN) tablet Take 1 tablet by mouth daily.      olmesartan (BENICAR) 20 MG tablet Take 20 mg by mouth daily.     ONE TOUCH ULTRA TEST test strip      Polyethyl Glycol-Propyl Glycol (SYSTANE OP) Place 1 drop into both eyes daily as needed (dry eyes).     saline (AYR) GEL Place 1 application into the nose at bedtime.     triamcinolone cream (KENALOG) 0.1 % as needed for itching.     XARELTO 20 MG TABS tablet TAKE ONE TABLET BY MOUTH ONE TIME DAILY with supper 90 tablet 1   No current facility-administered medications for this visit.    Allergies:   Augmentin [amoxicillin-pot clavulanate] and Cephalosporins   Social History:  The patient  reports that she has never smoked. She has never used smokeless tobacco. She reports that she does not drink alcohol and does not use drugs.   Family History:  The patient's family history includes Heart  failure in her father and mother.   ROS:  Please see the history of present illness.   Otherwise, review of systems is positive for none.   All other systems are reviewed and negative.   PHYSICAL EXAM: VS:  BP 134/66    Pulse 60    Ht 5\' 3"  (1.6 m)    Wt 204 lb 3.2 oz (92.6 kg)    SpO2 97%    BMI 36.17 kg/m  , BMI Body mass index is 36.17 kg/m. GEN: Well nourished, well developed, in no acute distress  HEENT: normal  Neck: no JVD, carotid bruits, or masses Cardiac: RRR; no murmurs, rubs, or gallops,no edema  Respiratory:  clear to auscultation bilaterally, normal work of breathing GI: soft, nontender, nondistended, + BS MS: no deformity or atrophy  Skin: warm and dry Neuro:  Strength and sensation are intact Psych: euthymic mood, full affect  EKG:  EKG is ordered today. Personal review of the ekg ordered shows sinus rhythm, rate 60  Recent Labs: 02/23/2020: BUN 13; Creatinine, Ser 0.59; Potassium 4.1; Sodium 139 06/21/2020: Hemoglobin 14.4; Platelets 180    Lipid Panel  No results found for: CHOL, TRIG, HDL, CHOLHDL, VLDL, LDLCALC, LDLDIRECT   Wt Readings from Last 3 Encounters:   07/27/20 204 lb 3.2 oz (92.6 kg)  07/13/20 201 lb (91.2 kg)  03/02/20 214 lb (97.1 kg)      Other studies Reviewed: Additional studies/ records that were reviewed today include: TTE 2017  Review of the above records today demonstrates:  - Left ventricle: The cavity size was normal. Systolic function was   normal. The estimated ejection fraction was in the range of 60%   to 65%. Wall motion was normal; there were no regional wall   motion abnormalities. The transmitral flow pattern was normal.   Left ventricular diastolic function parameters were normal. - Aortic valve: Moderate focal calcification, consistent with   sclerosis. There was mild regurgitation. - Mitral valve: There was mild regurgitation.   ASSESSMENT AND PLAN:  1.  Paroxysmal atrial fibrillation/flutter: Currently on Xarelto and Toprol-XL.  Post repeat ablation 10/29/2019.  CHA2DS2-VASc of 4.  She has had one short-lived episode of atrial fibrillation but no further.  We Beya Tipps continue with current management.   2.  Obstructive sleep apnea: CPAP compliance encouraged  3.  Hypertension: Currently well controlled  4.  Hyperlipidemia: Continue atorvastatin  5.  Morbid obesity: Diet and exercise encouraged    Current medicines are reviewed at length with the patient today.   The patient does not have concerns regarding her medicines.  The following changes were made today: None  Labs/ tests ordered today include:  Orders Placed This Encounter  Procedures   EKG 12-Lead     Disposition:   FU with Marleena Shubert 6 months  Signed, Conny Moening Meredith Leeds, MD  07/27/2020 10:54 AM     [address] [place] [address] [phone] (office) [phone] (fax)

## 2020-08-16 ENCOUNTER — Other Ambulatory Visit: Payer: Self-pay

## 2020-08-16 ENCOUNTER — Encounter: Payer: Self-pay | Admitting: Podiatry

## 2020-08-16 ENCOUNTER — Ambulatory Visit: Payer: Medicare HMO | Admitting: Podiatry

## 2020-08-16 DIAGNOSIS — M109 Gout, unspecified: Secondary | ICD-10-CM

## 2020-08-16 DIAGNOSIS — L600 Ingrowing nail: Secondary | ICD-10-CM | POA: Diagnosis not present

## 2020-08-16 DIAGNOSIS — M778 Other enthesopathies, not elsewhere classified: Secondary | ICD-10-CM

## 2020-08-16 MED ORDER — NEOMYCIN-POLYMYXIN-HC 1 % OT SOLN: OTIC | 1 refills | Status: DC

## 2020-08-16 NOTE — Patient Instructions (Signed)

## 2020-08-16 NOTE — Progress Notes (Signed)
She presents today for follow-up of gout she states that seems to be doing much better now not even worried about that right now however my toe is starting hurting she refers to the tibial border of the hallux left.  States is been bothering me on and off for a long time we had to fix the other than several years ago but I think that is time to fix this for now.  Objective: Vital signs are stable she is alert oriented x3.  Pulses are palpable.  No residual pain on range of motion of the first metatarsophalangeal joint left.  She does have pain on palpation of the medial or tibial border of the hallux nail plate left.  Assessment: Ingrown nail paronychia tibial border hallux left resolving gouty capsulitis left first MTPJ.  Plan: Chemical matricectomy was performed today along the tibial border after local anesthetic was administered she tolerated procedure well without complications.  And she was given both oral and written home-going instructions for the care and soaking of the toe.  Should she have questions or concerns she will notify us immediately otherwise I will follow-up with her in 2 weeks

## 2020-08-23 DIAGNOSIS — E1151 Type 2 diabetes mellitus with diabetic peripheral angiopathy without gangrene: Secondary | ICD-10-CM | POA: Diagnosis not present

## 2020-08-23 DIAGNOSIS — E785 Hyperlipidemia, unspecified: Secondary | ICD-10-CM | POA: Diagnosis not present

## 2020-08-23 DIAGNOSIS — I10 Essential (primary) hypertension: Secondary | ICD-10-CM | POA: Diagnosis not present

## 2020-08-24 DIAGNOSIS — R69 Illness, unspecified: Secondary | ICD-10-CM | POA: Diagnosis not present

## 2020-08-24 DIAGNOSIS — H35041 Retinal micro-aneurysms, unspecified, right eye: Secondary | ICD-10-CM | POA: Diagnosis not present

## 2020-08-24 DIAGNOSIS — E113293 Type 2 diabetes mellitus with mild nonproliferative diabetic retinopathy without macular edema, bilateral: Secondary | ICD-10-CM | POA: Diagnosis not present

## 2020-08-30 ENCOUNTER — Ambulatory Visit: Payer: Medicare HMO | Admitting: Podiatry

## 2020-08-31 ENCOUNTER — Encounter: Payer: Self-pay | Admitting: Podiatry

## 2020-08-31 ENCOUNTER — Other Ambulatory Visit: Payer: Self-pay

## 2020-08-31 ENCOUNTER — Ambulatory Visit: Payer: Medicare HMO | Admitting: Podiatry

## 2020-08-31 DIAGNOSIS — E1151 Type 2 diabetes mellitus with diabetic peripheral angiopathy without gangrene: Secondary | ICD-10-CM | POA: Diagnosis not present

## 2020-08-31 DIAGNOSIS — D333 Benign neoplasm of cranial nerves: Secondary | ICD-10-CM | POA: Diagnosis not present

## 2020-08-31 DIAGNOSIS — I480 Paroxysmal atrial fibrillation: Secondary | ICD-10-CM | POA: Diagnosis not present

## 2020-08-31 DIAGNOSIS — R82998 Other abnormal findings in urine: Secondary | ICD-10-CM | POA: Diagnosis not present

## 2020-08-31 DIAGNOSIS — Z0000 Encounter for general adult medical examination without abnormal findings: Secondary | ICD-10-CM | POA: Diagnosis not present

## 2020-08-31 DIAGNOSIS — L03032 Cellulitis of left toe: Secondary | ICD-10-CM

## 2020-08-31 DIAGNOSIS — E785 Hyperlipidemia, unspecified: Secondary | ICD-10-CM | POA: Diagnosis not present

## 2020-08-31 DIAGNOSIS — Z7901 Long term (current) use of anticoagulants: Secondary | ICD-10-CM | POA: Diagnosis not present

## 2020-08-31 DIAGNOSIS — G4733 Obstructive sleep apnea (adult) (pediatric): Secondary | ICD-10-CM | POA: Diagnosis not present

## 2020-08-31 DIAGNOSIS — I739 Peripheral vascular disease, unspecified: Secondary | ICD-10-CM | POA: Diagnosis not present

## 2020-08-31 DIAGNOSIS — M5416 Radiculopathy, lumbar region: Secondary | ICD-10-CM | POA: Diagnosis not present

## 2020-08-31 DIAGNOSIS — I10 Essential (primary) hypertension: Secondary | ICD-10-CM | POA: Diagnosis not present

## 2020-08-31 MED ORDER — DOXYCYCLINE HYCLATE 100 MG PO TABS: 100.0000 mg | ORAL_TABLET | Freq: Two times a day (BID) | ORAL | 0 refills | Status: DC

## 2020-08-31 NOTE — Progress Notes (Signed)
She presents today for follow-up of her nail procedure tibial border of the hallux left.  States that she has been soaking in Betadine recently converted to Epson salts.  Objective: Vital signs are stable alert oriented x3.  Pulses are palpable.  The tibial border does demonstrate some mild erythema no purulence no malodor is some mild tenderness.  Assessment: Mild paronychia status post matrixectomy tibial border hallux left.  Plan: Discussed etiology pathology conservative surgical therapies.  Recommended that she continue to soak Epson salts and warm water and started her on doxycycline.  Like to follow-up with her in about 2 weeks.

## 2020-09-01 ENCOUNTER — Ambulatory Visit: Payer: Medicare HMO | Admitting: Neurology

## 2020-09-01 ENCOUNTER — Encounter: Payer: Self-pay | Admitting: Neurology

## 2020-09-01 VITALS — BP 157/81 | HR 89 | Ht 63.0 in | Wt 205.0 lb

## 2020-09-01 DIAGNOSIS — G4733 Obstructive sleep apnea (adult) (pediatric): Secondary | ICD-10-CM

## 2020-09-01 DIAGNOSIS — Z1231 Encounter for screening mammogram for malignant neoplasm of breast: Secondary | ICD-10-CM | POA: Diagnosis not present

## 2020-09-01 DIAGNOSIS — Z9989 Dependence on other enabling machines and devices: Secondary | ICD-10-CM

## 2020-09-01 NOTE — Progress Notes (Signed)
Subjective:    Patient ID: Erika Kim is a 74 y.o. female.  HPI     Interim history:   Erika Kim is a very pleasant 74 year old right-handed woman with an underlying medical history of hyperlipidemia, acoustic neuroma, hypertension, DM, recurrent headaches, positional vertigo, left shoulder pain, and sinus congestion, status post sinus surgery, left knee surgery, tubal ligation, right rotator cuff surgery, right total knee replacement surgery, right great toe surgery, status post hysterectomy in 2018, paroxysmal A. fib with status post cardioversion in September 2020, who presents for follow-up consultation of her obstructive sleep apnea, on CPAP therapy.  The patient is unaccompanied today and presents for her yearly checkup. I last saw her on 09/02/19, At which time she was compliant with her CPAP and doing well.  She had a cardiac ablation in August 2020 and cardioversion in September 2020.  Today, 09/01/2020: I reviewed her CPAP compliance data from 07/31/2020 through 08/29/2020, which is a total of 30 days, during which time she used her machine every night with percent use days greater than 4 hours at 100%, indicating superb compliance with an average usage of 7 hours and 23 minutes, residual AHI at goal at 0.7/h, leak on the low side with a 95th percentile at 3.2 L/min on a pressure of 8 cm with EPR of 2.  Set up date was 02/25/2014.  She reports doing well.  She is compliant with CPAP, she is up-to-date with supplies, had some surplus.  She uses a cleaning machine for her CPAP.  She has done well after her cardiac ablation and her knee surgery.  Of note, she had interim left total knee replacement on 03/02/2020.  She had A. fib ablation on 10/29/2019.  She had a right total knee replacement some 11 years ago.   The patient's allergies, current medications, family history, past medical history, past social history, past surgical history and problem list were reviewed and updated as appropriate.       Previously:   I saw her on 08/27/2017, at which time she was doing well with CPAP and fully compliant.    She saw Vaughan Browner, in the interim, on 08/27/2018, at which time she was fully compliant with CPAP and advised to follow-up in 1 year.    I reviewed her CPAP compliance data from 08/02/2019 through 08/31/2019 which is a total of 30 days, during which time she used her machine every night with percent use days greater than 4 hours at 100%, indicating superb compliance with an average usage of 7 hours and 47 minutes, residual AHI at goal at 0.8/h, leak is low with a 95th percentile at 4.7 L/min on a pressure of 8 cm with EPR of 2.       I saw her on 08/22/2016, at which time she reported doing well. She was fully compliant with CPAP. She had no recent episode of headache or vertigo. She had a bout of A. fib some 6 months prior. She went to the ER and was diagnosed with paroxysmal A. fib, started on Xarelto and had a follow-up with cardiology.    I reviewed her CPAP compliance data from 07/28/2017 through 08/26/2017 which is a total of 30 days, during which time she used her machine every night with percent used days greater than 4 hours at 100%, indicating superb compliance with an average usage of 7 hours and 39 minutes, residual AHI at goal at 1.2 per hour, leak on the low side with the 95th percentile  at 3.1 L/m on a pressure of 8 cm with EPR of 2.    I saw her on 09/02/2015 for vertigo. She had seen Dr. Ernesto Rutherford on 07/20/2015 and he voiced concern that she may have vertiginous migraines. She reported 3 episodes of vertigo in 2016. She had some headaches which are infrequent, some associated with nausea but no significant photophobia and she did report that it would help to lie down and rest and she will take over-the-counter Advil. She felt that her headaches actually improved after she started CPAP therapy and she was fully compliant with CPAP. Her exam is nonfocal at the time and she  followed regularly with ENT. I suggested she continue with as needed Aleve for headaches and talked to her ENT about potentially pursuing vestibular rehabilitation for vertigo. She was fully compliant with CPAP therapy. I suggested a one-year checkup.   I reviewed her CPAP compliance data from 07/22/2016 through 08/20/2016 which is a total of 30 days, during which time she used her machine every night with percent used days greater than 4 hours at 100%, indicating superb compliance with an average usage of 7 hours and 36 minutes, residual AHI 1.3 per hour, leaked low with the 95th percentile at 6.1 L/m on a pressure of 8 cm with EPR of 2.    I saw her on 10/08/2014, at which time she was doing well on CPAP therapy with full compliance and great results. She had a recent brain MRI for her acoustic neuroma at the time and her MRI was felt to be stable per her report.   I reviewed her CPAP compliance data from 07/17/2015 through 08/15/2015 which is a total of 30 days during which time she used her machine every day with percent used days greater than 100%, indicating superb compliance with an average usage of 7 hours and 27 minutes, residual AHI low at 1.3 per hour, leak low with the 95th percentile at 4 L/m on a pressure of 8 cm with EPR of 2.   I saw her on 04/08/2014, at which time she reported adjusting well to CPAP and endorse sleeping deeply and without is many interruptions. She felt memory was stable. She had started her diabetes medication.   I reviewed her compliance data from 09/05/2014 to 10/04/2014 which is a total of 30 days during which time she used her machine every night with percent used days greater than 4 hours of 100%, indicating superb compliance. Average usage of 7 hours and 9 minutes, residual AHI low at 1.8 per hour and leak low at 6 L/m for the 95th percentile with the pressure currently at 8 cm with EPR of 2.   I saw her on 10/09/2013, at which time we discussed her recent  baseline sleep study which confirmed mild to moderate sleep apnea. I felt that because of her underlying sleep related complaints and her medical history she would benefit from treatment with CPAP and suggested a repeat sleep study with CPAP titration. She had the study on 01/26/2014 and I went over her test results with her in detail today. Sleep efficiency was markedly reduced at 47.1% with a long latency to sleep of 48.5 minutes and wake after sleep onset of 165 minutes with moderate sleep fragmentation noted. She had an increased percentage of slow-wave sleep and a normal percentage of REM sleep with a prolonged REM latency. She had no significant periodic leg movements of sleep and no significant EKG changes. She had a baseline oxygen saturation  of 93% with a nadir of 84%. Snoring was eliminated with CPAP which was started at 5 cm and titrated to 8 cm with elimination of her sleep disordered breathing and her snoring. REM sleep was achieved supine REM sleep was not achieved. Based on her test results I prescribed CPAP for her.   I reviewed the patient's CPAP compliance data from 03/08/2014 to 04/06/2014, which is a total of 30 days, during which time the patient used CPAP every day. The average usage for all days was 7 hours and 21 minutes. The percent used days greater than 4 hours was 100 %, indicating superb compliance. The residual AHI was low at 1.5 per hour, indicating an appropriate treatment pressure of 8 cwp with EPR of 2. Air leak from the mask was low with the 95th percentile at 5.4 L per minute.   I first met her on 08/08/2013 at which time she complained of chronic poor quality sleep, snoring, and recent onset of memory problems, as well as witnessed apneas.    She had a baseline sleep study on 09/04/2013. Her sleep efficiency was reduced at 66.1% with a prolonged latency to sleep of 34.5 minutes. Wake after sleep onset was 91 minutes with moderate sleep fragmentation noted. She had an  increased percentage of stage I and stage II sleep, and near normal percentage of deep sleep and a decreased percentage of REM sleep at 13.3% with a high normal REM latency of 117 minutes. She had no significant periodic leg movements of sleep. She had moderate to at times loud snoring. She had a total AHI of 6.4 per hour with further elevation to 20.9 per hour in the supine position. Her baseline oxygen saturation was 93%, her nadir was 88%. She spent 10 minutes and 14 seconds below the saturation of 90% for the night.   We called her with the test results in the interim and asked her to consider returning for CPAP titration study and she reported that she would like to discuss this with her husband first, which is why I saw her back in follow up.  Her Past Medical History Is Significant For: Past Medical History:  Diagnosis Date  . Acoustic neuroma ([place]) followed by dr brown at [facility]   right side w/ chronic roaring noise  . Anemia    hx of 2019 realted to Xarelto   . Anticoagulant long-term use    xarelto  . Benign paroxysmal positional vertigo   . Dysrhythmia    afib- 2 ablations followed by Dr Marlou Porch - lov - 4/21   . Endometrial polyp   . Family history of adverse reaction to anesthesia    daughter- nausea   . Frequency of urination   . Hx of transfusion of packed red blood cells    related to small intestine in 2019   . Hyperlipidemia   . Hypertension   . OA (osteoarthritis)    left knee  . OSA on CPAP    cpap - setting at ? 8   . PAF (paroxysmal atrial fibrillation) [facility]) cardiologist-  dr Marlou Porch   dx 04/ 2017  . PMB (postmenopausal bleeding)   . Pneumonia    walking pneumonia -age 55   . TMJ (temporomandibular joint disorder)    left side-- wears guard  . Type 2 diabetes mellitus ([place])   . Wears contact lenses     Her Past Surgical History Is Significant For: Past Surgical History:  Procedure Laterality Date  . ABDOMINAL HYSTERECTOMY    .  ATRIAL FIBRILLATION  ABLATION  05/29/2019  . ATRIAL FIBRILLATION ABLATION N/A 05/29/2019   Procedure: ATRIAL FIBRILLATION ABLATION;  Surgeon: Regan Lemming, MD;  Location: [facility];  Service: Cardiovascular;  Laterality: N/A;  . ATRIAL FIBRILLATION ABLATION N/A 10/29/2019   Procedure: ATRIAL FIBRILLATION ABLATION;  Surgeon: Regan Lemming, MD;  Location: [facility];  Service: Cardiovascular;  Laterality: N/A;  . CARDIOVERSION N/A 06/24/2019   Procedure: CARDIOVERSION;  Surgeon: Chilton Si, MD;  Location: [facility]OS[facility];  Service: Cardiovascular;  Laterality: N/A;  . CARPAL TUNNEL RELEASE Right 04/17/2007   w/ Excision ganglion cyst and Pulley Release right thumb  . DILATATION & CURETTAGE/HYSTEROSCOPY WITH MYOSURE N/A 01/12/2017   Procedure: DILATATION & CURETTAGE/HYSTEROSCOPY WITH MYOSURE;  Surgeon: Richardean Chimera, MD;  Location: [facility];  Service: Gynecology;  Laterality: N/A;  . ESOPHAGOGASTRODUODENOSCOPY (EGD) WITH PROPOFOL N/A 09/16/2018   Procedure: ESOPHAGOGASTRODUODENOSCOPY (EGD) WITH PROPOFOL;  Surgeon: Bernette Redbird, MD;  Location: [facility] ENDOSCOPY;  Service: Endoscopy;  Laterality: N/A;  . GIVENS CAPSULE STUDY N/A 09/16/2018   Procedure: GIVENS CAPSULE STUDY;  Surgeon: Bernette Redbird, MD;  Location: [facility] ENDOSCOPY;  Service: Endoscopy;  Laterality: N/A;  . HAMMER TOE SURGERY Left 2010   left second toe  . IR RADIOLOGY PERIPHERAL GUIDED IV START  10/27/2019  . IR US GUIDE VASC ACCESS RIGHT  10/27/2019  . KNEE ARTHROSCOPY Right 2010  . KNEE SURGERY Left 1993  . LYMPH NODE BIOPSY N/A 03/01/2017   Procedure: SENTINEL LYMPH NODE BIOPSY;  Surgeon: Adolphus Birchwood, MD;  Location: [facility];  Service: Gynecology;  Laterality: N/A;  . NASAL SINUS SURGERY  1985 and 1995  . ROBOTIC ASSISTED TOTAL HYSTERECTOMY WITH BILATERAL SALPINGO OOPHERECTOMY Bilateral 03/01/2017   Procedure: XI ROBOTIC ASSISTED TOTAL HYSTERECTOMY WITH BILATERAL SALPINGO OOPHORECTOMY;  Surgeon: Adolphus Birchwood, MD;   Location: [facility];  Service: Gynecology;  Laterality: Bilateral;  . ROTATOR CUFF REPAIR Right 04/2004  . TOE SURGERY Right 2012   Great toe and second toe  . TOTAL ELBOW REPLACEMENT Right 2000  . TOTAL KNEE ARTHROPLASTY Right 01/12/2010  . TOTAL KNEE ARTHROPLASTY Left 03/02/2020   Procedure: TOTAL KNEE ARTHROPLASTY;  Surgeon: Sheral Apley, MD;  Location: [facility];  Service: Orthopedics;  Laterality: Left;  . TRANSTHORACIC ECHOCARDIOGRAM  03/28/2016   ef 60-65%/ mild AR and MR  . TUBAL LIGATION Bilateral 1978    Her Family History Is Significant For: Family History  Problem Relation Age of Onset  . Heart failure Father   . Heart failure Mother     Her Social History Is Significant For: Social History   Socioeconomic History  . Marital status: Married    Spouse name: Not on file  . Number of children: 2  . Years of education: 75  . Highest education level: Not on file  Occupational History  . Not on file  Tobacco Use  . Smoking status: Never Smoker  . Smokeless tobacco: Never Used  Vaping Use  . Vaping Use: Never used  Substance and Sexual Activity  . Alcohol use: No  . Drug use: No  . Sexual activity: Not on file  Other Topics Concern  . Not on file  Social History Narrative   Married w/2 children (boy and girl).  Retired.   Social Determinants of Health   Financial Resource Strain:   . Difficulty of Paying Living Expenses: Not on file  Food Insecurity:   . Worried About Programme researcher, broadcasting/film/video in the Last Year: Not  on file  . Ran Out of Food in the Last Year: Not on file  Transportation Needs:   . Lack of Transportation (Medical): Not on file  . Lack of Transportation (Non-Medical): Not on file  Physical Activity:   . Days of Exercise per Week: Not on file  . Minutes of Exercise per Session: Not on file  Stress:   . Feeling of Stress : Not on file  Social Connections:   . Frequency of Communication with Friends and Family: Not on file  . Frequency of Social  Gatherings with Friends and Family: Not on file  . Attends Religious Services: Not on file  . Active Member of Clubs or Organizations: Not on file  . Attends Archivist Meetings: Not on file  . Marital Status: Not on file    Her Allergies Are:  Allergies  Allergen Reactions  . Augmentin [Amoxicillin-Pot Clavulanate] Itching    severe    . Cephalosporins Itching    severe  :   Her Current Medications Are:  Outpatient Encounter Medications as of 09/01/2020  Medication Sig  . atorvastatin (LIPITOR) 40 MG tablet Take 40 mg by mouth every morning.   . betamethasone valerate (VALISONE) 0.1 % cream 2 (two) times a week.   . Cholecalciferol (VITAMIN D3) 5000 units TABS Take 5,000 Units by mouth daily.   . Cranberry-Vitamin C (CRANBERRY CONCENTRATE/VITAMINC PO) Take 1 tablet by mouth daily.   Marland Kitchen docusate sodium (COLACE) 100 MG capsule Take 100 mg by mouth daily.  Marland Kitchen doxycycline (VIBRA-TABS) 100 MG tablet Take 1 tablet (100 mg total) by mouth 2 (two) times daily.  . hydrochlorothiazide (HYDRODIURIL) 12.5 MG tablet Take 12.5 mg by mouth daily.   Marland Kitchen ipratropium (ATROVENT) 0.03 % nasal spray Place 2 sprays into both nostrils every 12 (twelve) hours as needed for rhinitis.  Marland Kitchen JANUVIA 100 MG tablet Take 100 mg by mouth daily after supper.   . Lancets (ONETOUCH DELICA PLUS [id number]) [place]   . metoprolol succinate (TOPROL-XL) 50 MG 24 hr tablet Take 50 mg by mouth daily. Pt takes at supper time  . Multiple Vitamin (MULTIVITAMIN) tablet Take 1 tablet by mouth daily.  . NEOMYCIN-POLYMYXIN-HYDROCORTISONE (CORTISPORIN) 1 % SOLN OTIC solution Apply 1-2 drops to toe BID after soaking  . olmesartan (BENICAR) 20 MG tablet Take 20 mg by mouth daily.  . ONE TOUCH ULTRA TEST test strip   . Polyethyl Glycol-Propyl Glycol (SYSTANE OP) Place 1 drop into both eyes daily as needed (dry eyes).  . saline (AYR) GEL Place 1 application into the nose at bedtime.  . triamcinolone cream (KENALOG) 0.1 % as  needed for itching.  . Wheat Dextrin (BENEFIBER PO) Take by mouth.  Alveda Reasons 20 MG TABS tablet TAKE ONE TABLET BY MOUTH ONE TIME DAILY with supper   No facility-administered encounter medications on file as of 09/01/2020.  :  Review of Systems:  Out of a complete 14 point review of systems, all are reviewed and negative with the exception of these symptoms as listed below:  Review of Systems  Neurological:       Pt presents today to follow up on her cpap. Pt reports that her cpap is going well.    Objective:  Neurological Exam  Physical Exam Physical Examination:   Vitals:   09/01/20 1124  BP: (!) 157/81  Pulse: 89    General Examination: The patient is a very pleasant 74 y.o. female in no acute distress. She appears well-developed and well-nourished and  well groomed.   HEENT:Normocephalic, atraumatic, pupils are equal, round and reactive to light, extraocular tracking is preserved. Hearing is grossly intact. Face is symmetric with normal facial animation. Speech is clear with no dysarthria noted. There is no hypophonia. There is no lip, neck/head, jaw or voice tremor. Neck is supple with full range of passive and active motion. There are no carotid bruits on auscultation. Oropharynx exam reveals: mild mouth dryness, adequate dental hygiene and mild airway crowding. Tongue protrudes centrally and palate elevates symmetrically. Tonsils are absent.   Chest:Clear to auscultation without wheezing, rhonchi or crackles noted.  Heart:S1+S2+0, regular and normal without murmurs, rubs or gallops noted.   Abdomen:Soft, non-tender and non-distended with normal bowel sounds appreciated on auscultation. Abdomen in obese.  Extremities:There is no pitting edema in the distal lower extremities bilaterally.   Skin: Warm and dry without trophic changes noted.   Musculoskeletal: exam reveals no obvious joint deformities, tenderness or joint swelling or erythema, unremarkable scars  from bilateral knee replacement surgeries.    Neurologically:  Mental status: The patient is awake, alert and oriented in all 4 spheres. Her memory, attention, language and knowledge are appropriate. There is no aphasia, agnosia, apraxia or anomia. Speech is clear with normal prosody and enunciation. Thought process is linear. Mood is congruent and affect is normal.  Cranial nerves are as described above under HEENT exam. Motor exam: Normal bulk, strength and tone is noted. There is no tremor. Fine motor skills are grossly intact.  Cerebellar testing shows no dysmetria or intention tremor.  Sensory exam is intact to light touch in the upper and lower extremities.  Gait, station and balance:She stands without problems. Posture is age-appropriate and gait is unremarkable, very slight left-sided limp in the beginning.  No walking aid.    Assessment and Plan:   In summary, Erika Kim is a very pleasant 74 year old female with an underlying medical history of hyperlipidemia,PAF (on Xarelto),with s/p Cardiac ablation and status post cardioversion, recent ablation in 01/21, acoustic neuroma (follow[facility] by Dr. Owens Shark at [facility]), hypertension, DM, recurrent headaches, positional vertigo, left shoulder pain, and sinus congestion, statusmultiple surgeries including sinus surgery, left knee surgery,tubal ligation, right rotator cuff surgery, right total knee replacement surgery, right great toe surgery, and L TKA in 05/21, and obesity, who presents for follow-up consultation of her obstructive sleep apnea, on CPAP therapy with full compliance and ongoing good results. She has been tolerating the nasal pillows, and the current settings are adequate. She is encouraged to continue with treatment.Physical exam and neurological exam are stable and nonfocal. She is doing well.  She is advised to follow-up routinely in 1 year.  She is up-to-date with her supplies.  I answered all her questions today  and she was in agreement.

## 2020-09-01 NOTE — Patient Instructions (Signed)
It was good to see you again today. You are fully compliant with your CPAP. I am glad to hear that you have done well after your knee replacement and your second heart ablation.   Please continue using your CPAP regularly. While your insurance requires that you use CPAP at least 4 hours each night on 70% of the nights, I recommend, that you not skip any nights and use it throughout the night if you can. Getting used to CPAP and staying with the treatment long term does take time and patience and discipline. Untreated obstructive sleep apnea when it is moderate to severe can have an adverse impact on cardiovascular health and raise her risk for heart disease, arrhythmias, hypertension, congestive heart failure, stroke and diabetes. Untreated obstructive sleep apnea causes sleep disruption, nonrestorative sleep, and sleep deprivation. This can have an impact on your day to day functioning and cause daytime sleepiness and impairment of cognitive function, memory loss, mood disturbance, and problems focussing. Using CPAP regularly can improve these symptoms.  Keep up the good work! I will see you back in one year.

## 2020-09-06 DIAGNOSIS — R69 Illness, unspecified: Secondary | ICD-10-CM | POA: Diagnosis not present

## 2020-09-13 ENCOUNTER — Ambulatory Visit (INDEPENDENT_AMBULATORY_CARE_PROVIDER_SITE_OTHER): Payer: Medicare HMO | Admitting: Podiatry

## 2020-09-13 ENCOUNTER — Other Ambulatory Visit: Payer: Self-pay

## 2020-09-13 DIAGNOSIS — B351 Tinea unguium: Secondary | ICD-10-CM

## 2020-09-13 DIAGNOSIS — D689 Coagulation defect, unspecified: Secondary | ICD-10-CM | POA: Diagnosis not present

## 2020-09-13 DIAGNOSIS — M79676 Pain in unspecified toe(s): Secondary | ICD-10-CM

## 2020-09-13 NOTE — Progress Notes (Signed)
She presents today for follow-up left hallux paronychia status post matrixectomy last visit.  She has been taking her doxycycline just finished that up 3 days ago without any side effects.  States that she continues to soak twice a day.  She states that it no longer hurts.  She would like to have her nails cut.  Objective: Vital signs are stable alert oriented x3 there is no erythema edema cellulitis drainage or odor her nails are long thick yellow dystrophic onychomycotic.  No signs of infection of the matrixectomy site.  Assessment: Well-healing surgical foot pain in limb secondary to onychomycosis.  Plan: Encouraged her to soak every other day until there is absolutely no tenderness on palpation.  Debrided toenails 1 through 5 bilaterally.

## 2020-11-05 DIAGNOSIS — H52223 Regular astigmatism, bilateral: Secondary | ICD-10-CM | POA: Diagnosis not present

## 2020-12-23 ENCOUNTER — Ambulatory Visit: Payer: Medicare HMO | Admitting: Cardiology

## 2020-12-23 ENCOUNTER — Encounter: Payer: Self-pay | Admitting: Cardiology

## 2020-12-23 ENCOUNTER — Other Ambulatory Visit: Payer: Self-pay

## 2020-12-23 VITALS — BP 140/68 | HR 70 | Ht 63.0 in | Wt 209.0 lb

## 2020-12-23 DIAGNOSIS — I480 Paroxysmal atrial fibrillation: Secondary | ICD-10-CM | POA: Diagnosis not present

## 2020-12-23 DIAGNOSIS — G4733 Obstructive sleep apnea (adult) (pediatric): Secondary | ICD-10-CM

## 2020-12-23 NOTE — Progress Notes (Signed)
Cardiology Office Note:    Date:  12/23/2020   ID:  Erika Kim, DOB 16-Apr-1946, MRN [id number]  PCP:  [name], MD   [place]  Cardiologist:  [name], MD  Advanced Practice Provider:  No care team member to display Electrophysiologist:  Will Meredith Leeds, MD       Referring MD: [name], MD     History of Present Illness:    Erika Kim is a 75 y.o. female here for the follow-up of atrial fibrillation.  05/29/2019-cardioversion.  Had ablation repeat on 10/29/2019.  No return of atrial fibrillation.  Doing well.  No chest pain.  No recurrence of GI bleeding.  Colonoscopy reassuring recently.  Has acoustic neuroma, right sided hearing loss.  Followed at [facility].  Feels well no fevers chills nausea vomiting syncope  Past Medical History:  Diagnosis Date  . Acoustic neuroma ([place]) followed by dr brown at [facility]   right side w/ chronic roaring noise  . Anemia    hx of 2019 realted to Xarelto   . Anticoagulant long-term use    xarelto  . Benign paroxysmal positional vertigo   . Dysrhythmia    afib- 2 ablations followed by Dr Marlou Porch - lov - 4/21   . Endometrial polyp   . Family history of adverse reaction to anesthesia    daughter- nausea   . Frequency of urination   . Hx of transfusion of packed red blood cells    related to small intestine in 2019   . Hyperlipidemia   . Hypertension   . OA (osteoarthritis)    left knee  . OSA on CPAP    cpap - setting at ? 8   . PAF (paroxysmal atrial fibrillation) [facility]) cardiologist-  dr Marlou Porch   dx 04/ 2017  . PMB (postmenopausal bleeding)   . Pneumonia    walking pneumonia -age 66   . TMJ (temporomandibular joint disorder)    left side-- wears guard  . Type 2 diabetes mellitus ([place])   . Wears contact lenses     Past Surgical History:  Procedure Laterality Date  . ABDOMINAL HYSTERECTOMY    . ATRIAL FIBRILLATION ABLATION  05/29/2019  . ATRIAL FIBRILLATION ABLATION N/A  05/29/2019   Procedure: ATRIAL FIBRILLATION ABLATION;  Surgeon: [name], MD;  Location: [place] [facility];  Service: Cardiovascular;  Laterality: N/A;  . ATRIAL FIBRILLATION ABLATION N/A 10/29/2019   Procedure: ATRIAL FIBRILLATION ABLATION;  Surgeon: [name], MD;  Location: [place] [facility];  Service: Cardiovascular;  Laterality: N/A;  . CARDIOVERSION N/A 06/24/2019   Procedure: CARDIOVERSION;  Surgeon: Skeet Latch, MD;  Location: [place];  Service: Cardiovascular;  Laterality: N/A;  . CARPAL TUNNEL RELEASE Right 04/17/2007   w/ Excision ganglion cyst and Pulley Release right thumb  . DILATATION & CURETTAGE/HYSTEROSCOPY WITH MYOSURE N/A 01/12/2017   Procedure: DILATATION & CURETTAGE/HYSTEROSCOPY WITH MYOSURE;  Surgeon: Arvella Nigh, MD;  Location: [place];  Service: Gynecology;  Laterality: N/A;  . ESOPHAGOGASTRODUODENOSCOPY (EGD) WITH PROPOFOL N/A 09/16/2018   Procedure: ESOPHAGOGASTRODUODENOSCOPY (EGD) WITH PROPOFOL;  Surgeon: Ronald Lobo, MD;  Location: [place];  Service: Endoscopy;  Laterality: N/A;  . GIVENS CAPSULE STUDY N/A 09/16/2018   Procedure: GIVENS CAPSULE STUDY;  Surgeon: Ronald Lobo, MD;  Location: [place];  Service: Endoscopy;  Laterality: N/A;  . HAMMER TOE SURGERY Left 2010   left second toe  . IR RADIOLOGY PERIPHERAL GUIDED IV START  10/27/2019  .  IR US GUIDE VASC ACCESS RIGHT  10/27/2019  . KNEE ARTHROSCOPY Right 2010  . KNEE SURGERY Left 1993  . LYMPH NODE BIOPSY N/A 03/01/2017   Procedure: SENTINEL LYMPH NODE BIOPSY;  Surgeon: Everitt Amber, MD;  Location: [facility];  Service: Gynecology;  Laterality: N/A;  . NASAL SINUS SURGERY  1985 and 1995  . ROBOTIC ASSISTED TOTAL HYSTERECTOMY WITH BILATERAL SALPINGO OOPHERECTOMY Bilateral 03/01/2017   Procedure: XI ROBOTIC ASSISTED TOTAL HYSTERECTOMY WITH BILATERAL SALPINGO OOPHORECTOMY;  Surgeon: Everitt Amber, MD;  Location: [facility];  Service: Gynecology;  Laterality:  Bilateral;  . ROTATOR CUFF REPAIR Right 04/2004  . TOE SURGERY Right 2012   Great toe and second toe  . TOTAL ELBOW REPLACEMENT Right 2000  . TOTAL KNEE ARTHROPLASTY Right 01/12/2010  . TOTAL KNEE ARTHROPLASTY Left 03/02/2020   Procedure: TOTAL KNEE ARTHROPLASTY;  Surgeon: Renette Butters, MD;  [facility];  Service: Orthopedics;  Laterality: Left;  . TRANSTHORACIC ECHOCARDIOGRAM  03/28/2016   ef 60-65%/ mild AR and MR  . TUBAL LIGATION Bilateral 1978    Current Medications: Current Meds  Medication Sig  . atorvastatin (LIPITOR) 40 MG tablet Take 40 mg by mouth every morning.   . betamethasone valerate (VALISONE) 0.1 % cream 2 (two) times a week.   . Cholecalciferol (VITAMIN D3) 5000 units TABS Take 5,000 Units by mouth daily.   . Cranberry-Vitamin C (CRANBERRY CONCENTRATE/VITAMINC PO) Take 1 tablet by mouth daily.   Marland Kitchen docusate sodium (COLACE) 100 MG capsule Take 100 mg by mouth daily.  Marland Kitchen doxycycline (VIBRA-TABS) 100 MG tablet Take 1 tablet (100 mg total) by mouth 2 (two) times daily.  . hydrochlorothiazide (HYDRODIURIL) 12.5 MG tablet Take 12.5 mg by mouth daily.   Marland Kitchen ipratropium (ATROVENT) 0.03 % nasal spray Place 2 sprays into both nostrils every 12 (twelve) hours as needed for rhinitis.  Marland Kitchen JANUVIA 100 MG tablet Take 100 mg by mouth daily after supper.   . Lancets (ONETOUCH DELICA PLUS [id number]) [place]   . metoprolol succinate (TOPROL-XL) 50 MG 24 hr tablet Take 50 mg by mouth daily. Pt takes at supper time  . Multiple Vitamin (MULTIVITAMIN) tablet Take 1 tablet by mouth daily.  . NEOMYCIN-POLYMYXIN-HYDROCORTISONE (CORTISPORIN) 1 % SOLN OTIC solution Apply 1-2 drops to toe BID after soaking  . olmesartan (BENICAR) 20 MG tablet Take 20 mg by mouth daily.  . ONE TOUCH ULTRA TEST test strip   . Polyethyl Glycol-Propyl Glycol (SYSTANE OP) Place 1 drop into both eyes daily as needed (dry eyes).  . saline (AYR) GEL Place 1 application into the nose at bedtime.  . triamcinolone cream  (KENALOG) 0.1 % as needed for itching.  . Wheat Dextrin (BENEFIBER PO) Take by mouth.  Alveda Reasons 20 MG TABS tablet TAKE ONE TABLET BY MOUTH ONE TIME DAILY with supper     Allergies:   Augmentin [amoxicillin-pot clavulanate] and Cephalosporins   Social History   Socioeconomic History  . Marital status: Married    Spouse name: Not on file  . Number of children: 2  . Years of education: 39  . Highest education level: Not on file  Occupational History  . Not on file  Tobacco Use  . Smoking status: Never Smoker  . Smokeless tobacco: Never Used  Vaping Use  . Vaping Use: Never used  Substance and Sexual Activity  . Alcohol use: No  . Drug use: No  . Sexual activity: Not on file  Other Topics Concern  . Not on file  Social History Narrative   Married w/2 children (boy and girl).  Retired.   Social Determinants of Health   Financial Resource Strain: Not on file  Food Insecurity: Not on file  Transportation Needs: Not on file  Physical Activity: Not on file  Stress: Not on file  Social Connections: Not on file     Family History: The patient's family history includes Heart failure in her father and mother.  ROS:   Please see the history of present illness.     All other systems reviewed and are negative.  EKGs/Labs/Other Studies Reviewed:     Recent Labs: 02/23/2020: BUN 13; Creatinine, Ser 0.59; Potassium 4.1; Sodium 139 06/21/2020: Hemoglobin 14.4; Platelets 180  Recent Lipid Panel No results found for: CHOL, TRIG, HDL, CHOLHDL, VLDL, LDLCALC, LDLDIRECT   Risk Assessment/Calculations:      Physical Exam:    VS:  BP 140/68 (BP Location: Left Arm, Patient Position: Sitting, Cuff Size: Normal)   Pulse 70   Ht 5\' 3"  (1.6 m)   Wt 209 lb (94.8 kg)   SpO2 95%   BMI 37.02 kg/m     Wt Readings from Last 3 Encounters:  12/23/20 209 lb (94.8 kg)  09/01/20 205 lb (93 kg)  07/27/20 204 lb 3.2 oz (92.6 kg)     GEN:  Well nourished, well developed in no acute  distress HEENT: Normal NECK: No JVD; No carotid bruits LYMPHATICS: No lymphadenopathy CARDIAC: RRR, no murmurs, rubs, gallops RESPIRATORY:  Clear to auscultation without rales, wheezing or rhonchi  ABDOMEN: Soft, non-tender, non-distended MUSCULOSKELETAL:  No edema; No deformity  SKIN: Warm and dry NEUROLOGIC:  Alert and oriented x 3 PSYCHIATRIC:  Normal affect   ASSESSMENT:    1. Paroxysmal atrial fibrillation (HCC)   2. OSA (obstructive sleep apnea)    PLAN:    In order of problems listed above:  Paroxysmal atrial fibrillation flutter -Repeat ablation 10/29/2019.  Remaining in sinus rhythm.  Flecainide was stopped in April 2021.  Continue with Toprol in the evening.  Dr. Curt Bears.  Doing well.  Obstructive sleep apnea -CPAP compliance encouraged.  Essential hypertension -Well-controlled  Prior GI bleed --3 years ago, stable.   Medication Adjustments/Labs and Tests Ordered: Current medicines are reviewed at length with the patient today.  Concerns regarding medicines are outlined above.  No orders of the defined types were placed in this encounter.  No orders of the defined types were placed in this encounter.   Patient Instructions  Medication Instructions:  Your physician recommends that you continue on your current medications as directed. Please refer to the Current Medication list given to you today. *If you need a refill on your cardiac medications before your next appointment, please call your pharmacy*    Follow-Up: At [facility], you and your health needs are our priority.  As part of our continuing mission to provide you with exceptional heart care, we have created designated Provider Care Teams.  These Care Teams include your primary Cardiologist (physician) and Advanced Practice Providers (APPs -  Physician Assistants and Nurse Practitioners) who all work together to provide you with the care you need, when you need it.   Your next appointment:   6  month(s)  The format for your next appointment:   In Person  Provider:   You may see [name], MD or one of the following Advanced Practice Providers on your designated Care Team:    Kathyrn Drown, NP        Signed,  [name], MD  12/23/2020 1:01 PM    [facility]

## 2020-12-23 NOTE — Patient Instructions (Signed)
Medication Instructions:  Your physician recommends that you continue on your current medications as directed. Please refer to the Current Medication list given to you today. *If you need a refill on your cardiac medications before your next appointment, please call your pharmacy*   Follow-Up: At [facility], you and your health needs are our priority.  As part of our continuing mission to provide you with exceptional heart care, we have created designated Provider Care Teams.  These Care Teams include your primary Cardiologist (physician) and Advanced Practice Providers (APPs -  Physician Assistants and Nurse Practitioners) who all work together to provide you with the care you need, when you need it.     Your next appointment:   6 month(s)  The format for your next appointment:   In Person  Provider:   You may see Mark Skains, MD or one of the following Advanced Practice Providers on your designated Care Team:    Jill McDaniel, NP     

## 2020-12-30 DIAGNOSIS — G4733 Obstructive sleep apnea (adult) (pediatric): Secondary | ICD-10-CM | POA: Diagnosis not present

## 2021-01-12 DIAGNOSIS — G5621 Lesion of ulnar nerve, right upper limb: Secondary | ICD-10-CM | POA: Diagnosis not present

## 2021-01-12 DIAGNOSIS — M25531 Pain in right wrist: Secondary | ICD-10-CM | POA: Diagnosis not present

## 2021-01-12 DIAGNOSIS — M25831 Other specified joint disorders, right wrist: Secondary | ICD-10-CM | POA: Diagnosis not present

## 2021-01-12 DIAGNOSIS — M79641 Pain in right hand: Secondary | ICD-10-CM | POA: Diagnosis not present

## 2021-01-24 ENCOUNTER — Other Ambulatory Visit: Payer: Self-pay

## 2021-01-24 MED ORDER — RIVAROXABAN 20 MG PO TABS: ORAL_TABLET | ORAL | 1 refills | Status: DC

## 2021-01-31 ENCOUNTER — Encounter: Payer: Self-pay | Admitting: Cardiology

## 2021-01-31 ENCOUNTER — Ambulatory Visit: Payer: Medicare HMO | Admitting: Cardiology

## 2021-01-31 ENCOUNTER — Other Ambulatory Visit: Payer: Self-pay

## 2021-01-31 VITALS — BP 144/80 | HR 64 | Ht 63.0 in | Wt 212.6 lb

## 2021-01-31 DIAGNOSIS — Z79899 Other long term (current) drug therapy: Secondary | ICD-10-CM

## 2021-01-31 DIAGNOSIS — I480 Paroxysmal atrial fibrillation: Secondary | ICD-10-CM | POA: Diagnosis not present

## 2021-01-31 LAB — BASIC METABOLIC PANEL
BUN/Creatinine Ratio: 18 (ref 12–28)
BUN: 12 mg/dL (ref 8–27)
CO2: 24 mmol/L (ref 20–29)
Calcium: 9.8 mg/dL (ref 8.7–10.3)
Chloride: 101 mmol/L (ref 96–106)
Creatinine, Ser: 0.67 mg/dL (ref 0.57–1.00)
Glucose: 100 mg/dL — ABNORMAL HIGH (ref 65–99)
Potassium: 4.3 mmol/L (ref 3.5–5.2)
Sodium: 140 mmol/L (ref 134–144)
eGFR: 92 mL/min/{1.73_m2} (ref >59)

## 2021-01-31 LAB — CBC
Hematocrit: 41.6 % (ref 34.0–46.6)
Hemoglobin: 14.1 g/dL (ref 11.1–15.9)
MCH: 29.3 pg (ref 26.6–33.0)
MCHC: 33.9 g/dL (ref 31.5–35.7)
MCV: 87 fL (ref 79–97)
Platelets: 162 10*3/uL (ref 150–450)
RBC: 4.81 x10E6/uL (ref 3.77–5.28)
RDW: 12.6 % (ref 11.7–15.4)
WBC: 7 10*3/uL (ref 3.4–10.8)

## 2021-01-31 MED ORDER — METOPROLOL SUCCINATE ER 50 MG PO TB24: 50.0000 mg | ORAL_TABLET | Freq: Two times a day (BID) | ORAL | 1 refills | Status: DC

## 2021-01-31 NOTE — Patient Instructions (Addendum)
Medication Instructions:  Your physician has recommended you make the following change in your medication:  1. INCREASE Toprol to 50 mg TWICE daily  *If you need a refill on your cardiac medications before your next appointment, please call your pharmacy*   Lab Work: Xarelto surveillance lab work today: BMET  & CBC If you have labs (blood work) drawn today and your tests are completely normal, you will receive your results only by: Marland Kitchen MyChart Message (if you have MyChart) OR . A paper copy in the mail If you have any lab test that is abnormal or we need to change your treatment, we will call you to review the results.   Testing/Procedures: None ordered   Follow-Up: At [facility], you and your health needs are our priority.  As part of our continuing mission to provide you with exceptional heart care, we have created designated Provider Care Teams.  These Care Teams include your primary Cardiologist (physician) and Advanced Practice Providers (APPs -  Physician Assistants and Nurse Practitioners) who all work together to provide you with the care you need, when you need it.  Your next appointment:   1 year  The format for your next appointment:   In Person  Provider:   Allegra Lai, MD    Thank you for choosing [facility]!!   [name], RN [phone]

## 2021-01-31 NOTE — Progress Notes (Signed)
Electrophysiology Office Note   Date:  01/31/2021   ID:  Erika Kim, Erika Kim [birth year], MRN [id number]  PCP:  [name], MD  Cardiologist:  Marlou Porch Primary Electrophysiologist:  Kiril Hippe Meredith Leeds, MD    No chief complaint on file.    History of Present Illness: Erika Kim is a 75 y.o. female who is being seen today for the evaluation of atrial fibrillation at the request of [name]. Presenting today for electrophysiology evaluation.  She has a history significant for hypertension, hyperlipidemia, paroxysmal atrial fibrillation, diabetes, OSA on CPAP.  She is status post ablation 05/29/2019.  She returned with atrial flutter and required cardioversion.  She is now status post repeat ablation 10/29/2019.  Today, denies symptoms of palpitations, chest pain, shortness of breath, orthopnea, PND, lower extremity edema, claudication, dizziness, presyncope, syncope, bleeding, or neurologic sequela. The patient is tolerating medications without difficulties.  She is continued to have short episodes of atrial fibrillation.  She has had 2-3 since last being seen.  She feels palpitations and some shortness of breath when she has these episodes.  She is also worried about CVA, though she is on Xarelto which she now knows reduces her risk.   Past Medical History:  Diagnosis Date  . Acoustic neuroma ([place]) followed by dr brown at [facility]   right side w/ chronic roaring noise  . Anemia    hx of 2019 realted to Xarelto   . Anticoagulant long-term use    xarelto  . Benign paroxysmal positional vertigo   . Dysrhythmia    afib- 2 ablations followed by Dr Marlou Porch - lov - 4/21   . Endometrial polyp   . Family history of adverse reaction to anesthesia    daughter- nausea   . Frequency of urination   . Hx of transfusion of packed red blood cells    related to small intestine in 2019   . Hyperlipidemia   . Hypertension   . OA (osteoarthritis)    left knee  . OSA on CPAP    cpap -  setting at ? 8   . PAF (paroxysmal atrial fibrillation) [facility]) cardiologist-  dr Marlou Porch   dx 04/ 2017  . PMB (postmenopausal bleeding)   . Pneumonia    walking pneumonia -age 33   . TMJ (temporomandibular joint disorder)    left side-- wears guard  . Type 2 diabetes mellitus ([place])   . Wears contact lenses    Past Surgical History:  Procedure Laterality Date  . ABDOMINAL HYSTERECTOMY    . ATRIAL FIBRILLATION ABLATION  05/29/2019  . ATRIAL FIBRILLATION ABLATION N/A 05/29/2019   Procedure: ATRIAL FIBRILLATION ABLATION;  Surgeon: [name], MD;  Location: [place] [facility];  Service: Cardiovascular;  Laterality: N/A;  . ATRIAL FIBRILLATION ABLATION N/A 10/29/2019   Procedure: ATRIAL FIBRILLATION ABLATION;  Surgeon: [name], MD;  Location: [place] [facility];  Service: Cardiovascular;  Laterality: N/A;  . CARDIOVERSION N/A 06/24/2019   Procedure: CARDIOVERSION;  Surgeon: Skeet Latch, MD;  Location: [place];  Service: Cardiovascular;  Laterality: N/A;  . CARPAL TUNNEL RELEASE Right 04/17/2007   w/ Excision ganglion cyst and Pulley Release right thumb  . DILATATION & CURETTAGE/HYSTEROSCOPY WITH MYOSURE N/A 01/12/2017   Procedure: DILATATION & CURETTAGE/HYSTEROSCOPY WITH MYOSURE;  Surgeon: Arvella Nigh, MD;  Location: [place];  Service: Gynecology;  Laterality: N/A;  . ESOPHAGOGASTRODUODENOSCOPY (EGD) WITH PROPOFOL N/A 09/16/2018   Procedure: ESOPHAGOGASTRODUODENOSCOPY (EGD) WITH PROPOFOL;  Surgeon: Ronald Lobo, MD;  Location: [facility];  Service: Endoscopy;  Laterality: N/A;  . GIVENS CAPSULE STUDY N/A 09/16/2018   Procedure: GIVENS CAPSULE STUDY;  Surgeon: Ronald Lobo, MD;  Location: [place];  Service: Endoscopy;  Laterality: N/A;  . HAMMER TOE SURGERY Left 2010   left second toe  . IR RADIOLOGY PERIPHERAL GUIDED IV START  10/27/2019  . IR US GUIDE VASC ACCESS RIGHT  10/27/2019  . KNEE ARTHROSCOPY Right 2010  . KNEE SURGERY Left  1993  . LYMPH NODE BIOPSY N/A 03/01/2017   Procedure: SENTINEL LYMPH NODE BIOPSY;  Surgeon: Everitt Amber, MD;  [facility];  Service: Gynecology;  Laterality: N/A;  . NASAL SINUS SURGERY  1985 and 1995  . ROBOTIC ASSISTED TOTAL HYSTERECTOMY WITH BILATERAL SALPINGO OOPHERECTOMY Bilateral 03/01/2017   Procedure: XI ROBOTIC ASSISTED TOTAL HYSTERECTOMY WITH BILATERAL SALPINGO OOPHORECTOMY;  Surgeon: Everitt Amber, MD;  [facility];  Service: Gynecology;  Laterality: Bilateral;  . ROTATOR CUFF REPAIR Right 04/2004  . TOE SURGERY Right 2012   Great toe and second toe  . TOTAL ELBOW REPLACEMENT Right 2000  . TOTAL KNEE ARTHROPLASTY Right 01/12/2010  . TOTAL KNEE ARTHROPLASTY Left 03/02/2020   Procedure: TOTAL KNEE ARTHROPLASTY;  Surgeon: Renette Butters, MD;  [facility];  Service: Orthopedics;  Laterality: Left;  . TRANSTHORACIC ECHOCARDIOGRAM  03/28/2016   ef 60-65%/ mild AR and MR  . TUBAL LIGATION Bilateral 1978     Current Outpatient Medications  Medication Sig Dispense Refill  . atorvastatin (LIPITOR) 40 MG tablet Take 40 mg by mouth every morning.     . betamethasone valerate (VALISONE) 0.1 % cream 2 (two) times a week.     . Cholecalciferol (VITAMIN D3) 5000 units TABS Take 5,000 Units by mouth daily.     . Cranberry-Vitamin C (CRANBERRY CONCENTRATE/VITAMINC PO) Take 1 tablet by mouth daily.     Marland Kitchen docusate sodium (COLACE) 100 MG capsule Take 100 mg by mouth daily.    Marland Kitchen doxycycline (VIBRA-TABS) 100 MG tablet Take 1 tablet (100 mg total) by mouth 2 (two) times daily. (Patient taking differently: Take 100 mg by mouth 2 (two) times daily. FOR DENTAL PROCEDURES) 20 tablet 0  . hydrochlorothiazide (HYDRODIURIL) 12.5 MG tablet Take 12.5 mg by mouth daily.     Marland Kitchen ipratropium (ATROVENT) 0.03 % nasal spray Place 2 sprays into both nostrils every 12 (twelve) hours as needed for rhinitis.    Marland Kitchen JANUVIA 100 MG tablet Take 100 mg by mouth daily after supper.     . Lancets (ONETOUCH DELICA  PLUS [id number]) [place]     . metoprolol succinate (TOPROL-XL) 50 MG 24 hr tablet Take 1 tablet (50 mg total) by mouth in the morning and at bedtime. Take with or immediately following a meal. 180 tablet 1  . Multiple Vitamin (MULTIVITAMIN) tablet Take 1 tablet by mouth daily.    . NEOMYCIN-POLYMYXIN-HYDROCORTISONE (CORTISPORIN) 1 % SOLN OTIC solution Apply 1-2 drops to toe BID after soaking 10 mL 1  . olmesartan (BENICAR) 20 MG tablet Take 20 mg by mouth daily.    . ONE TOUCH ULTRA TEST test strip     . Polyethyl Glycol-Propyl Glycol (SYSTANE OP) Place 1 drop into both eyes daily as needed (dry eyes).    . rivaroxaban (XARELTO) 20 MG TABS tablet TAKE ONE TABLET BY MOUTH ONE TIME DAILY with supper 90 tablet 1  . saline (AYR) GEL Place 1 application into the nose at bedtime.    . triamcinolone cream (KENALOG) 0.1 % as  needed for itching.    . Wheat Dextrin (BENEFIBER PO) Take by mouth.     No current facility-administered medications for this visit.    Allergies:   Augmentin [amoxicillin-pot clavulanate] and Cephalosporins   Social History:  The patient  reports that she has never smoked. She has never used smokeless tobacco. She reports that she does not drink alcohol and does not use drugs.   Family History:  The patient's family history includes Heart failure in her father and mother.   ROS:  Please see the history of present illness.   Otherwise, review of systems is positive for none.   All other systems are reviewed and negative.   PHYSICAL EXAM: VS:  BP (!) 144/80   Pulse 64   Ht 5\' 3"  (1.6 m)   Wt 212 lb 9.6 oz (96.4 kg)   SpO2 97%   BMI 37.66 kg/m  , BMI Body mass index is 37.66 kg/m. GEN: Well nourished, well developed, in no acute distress  HEENT: normal  Neck: no JVD, carotid bruits, or masses Cardiac: RRR; no murmurs, rubs, or gallops,no edema  Respiratory:  clear to auscultation bilaterally, normal work of breathing GI: soft, nontender, nondistended, + BS MS: no  deformity or atrophy  Skin: warm and dry Neuro:  Strength and sensation are intact Psych: euthymic mood, full affect  EKG:  EKG is ordered today. Personal review of the ekg ordered shows sinus rhythm, rate 64  Recent Labs: 02/23/2020: BUN 13; Creatinine, Ser 0.59; Potassium 4.1; Sodium 139 06/21/2020: Hemoglobin 14.4; Platelets 180    Lipid Panel  No results found for: CHOL, TRIG, HDL, CHOLHDL, VLDL, LDLCALC, LDLDIRECT   Wt Readings from Last 3 Encounters:  01/31/21 212 lb 9.6 oz (96.4 kg)  12/23/20 209 lb (94.8 kg)  09/01/20 205 lb (93 kg)      Other studies Reviewed: Additional studies/ records that were reviewed today include: TTE 2017  Review of the above records today demonstrates:  - Left ventricle: The cavity size was normal. Systolic function was   normal. The estimated ejection fraction was in the range of 60%   to 65%. Wall motion was normal; there were no regional wall   motion abnormalities. The transmitral flow pattern was normal.   Left ventricular diastolic function parameters were normal. - Aortic valve: Moderate focal calcification, consistent with   sclerosis. There was mild regurgitation. - Mitral valve: There was mild regurgitation.   ASSESSMENT AND PLAN:  1.  Paroxysmal atrial fibrillation/flutter: Currently on Xarelto and Toprol-XL.  Status post repeat ablation 10/29/2019.  CHA2DS2-VASc of 4.  She has had a few short episodes of atrial fibrillation.  Due to that we Virdia Ziesmer increase her Toprol-XL to twice daily.  Otherwise no changes.  2.  Obstructive sleep apnea: CPAP compliance encouraged  3.  Hypertension: Mildly elevated.  Increasing metoprolol.  4.  Hyperlipidemia: Continue atorvastatin  5.  Morbid obesity: Diet and exercise encouraged    Current medicines are reviewed at length with the patient today.   The patient does not have concerns regarding her medicines.  The following changes were made today: Increase Toprol-XL to twice daily  Labs/  tests ordered today include:  Orders Placed This Encounter  Procedures  . Basic metabolic panel  . CBC  . EKG 12-Lead     Disposition:   FU with Kyran Connaughton 6 months  Signed, Analis Distler Meredith Leeds, MD  01/31/2021 9:33 AM     [address]  [place] [address] [phone] (office) [phone] (fax)

## 2021-02-07 DIAGNOSIS — H04123 Dry eye syndrome of bilateral lacrimal glands: Secondary | ICD-10-CM | POA: Diagnosis not present

## 2021-02-07 DIAGNOSIS — H1131 Conjunctival hemorrhage, right eye: Secondary | ICD-10-CM | POA: Diagnosis not present

## 2021-02-10 ENCOUNTER — Encounter: Payer: Self-pay | Admitting: Podiatry

## 2021-02-10 ENCOUNTER — Other Ambulatory Visit: Payer: Self-pay

## 2021-02-10 ENCOUNTER — Ambulatory Visit: Payer: Medicare HMO | Admitting: Podiatry

## 2021-02-10 DIAGNOSIS — M7752 Other enthesopathy of left foot: Secondary | ICD-10-CM

## 2021-02-10 DIAGNOSIS — M778 Other enthesopathies, not elsewhere classified: Secondary | ICD-10-CM

## 2021-02-10 MED ORDER — TRIAMCINOLONE ACETONIDE 40 MG/ML IJ SUSP
40.0000 mg | Freq: Once | INTRAMUSCULAR | Status: AC
  Administered 2021-02-10: 40 mg

## 2021-02-13 NOTE — Progress Notes (Signed)
She presents today states that this toe is still sore where the matrixectomy was performed.  Still states that it is still red.  More red and sore.  She is also complaining of pain to the dorsal aspect of the left foot and midfoot on the right.  She states that has some pains lately but does not always hurt.  Objective: Vital signs are stable alert and oriented x3.  Pulses are palpable.  Toe appears to be healing very nicely there is some postinflammatory hyperpigmentation that is still present.  It does not demonstrate any signs of infection.  She has pain on palpation of the subtalar joint and on end range of motion of the subtalar joint of the left foot.  Sinus tarsitis.  Painful.  She also has pain and osteoarthritic changes to the dorsal aspect Lisfranc's joints right foot.  Assessment: Subtalar joint capsulitis left.  Osteoarthritis and capsulitis dorsal aspect right.  Well-healing surgical toe.  Plan: At this point I injected subtalar joint today with 20 mg Kenalog 5 mg Marcaine point of maximal tenderness.  I also injected the dorsal aspect of the right foot for the capsulitis with 10 mg of Kenalog she tolerated the procedure well without complications we will follow-up with her in a few months.

## 2021-02-28 DIAGNOSIS — E1151 Type 2 diabetes mellitus with diabetic peripheral angiopathy without gangrene: Secondary | ICD-10-CM | POA: Diagnosis not present

## 2021-03-07 DIAGNOSIS — M25562 Pain in left knee: Secondary | ICD-10-CM | POA: Diagnosis not present

## 2021-03-07 DIAGNOSIS — M25561 Pain in right knee: Secondary | ICD-10-CM | POA: Diagnosis not present

## 2021-03-09 ENCOUNTER — Telehealth: Payer: Self-pay | Admitting: *Deleted

## 2021-03-09 ENCOUNTER — Other Ambulatory Visit: Payer: Self-pay | Admitting: Orthopedic Surgery

## 2021-03-09 DIAGNOSIS — G5621 Lesion of ulnar nerve, right upper limb: Secondary | ICD-10-CM | POA: Diagnosis not present

## 2021-03-09 NOTE — Telephone Encounter (Signed)
   [facility] Pre-operative Risk Assessment    Patient Name: Erika Kim  DOB: [birth year]  MRN: [id number]   HEARTCARE STAFF: - Please ensure there is not already an duplicate clearance open for this procedure. - Under Visit Info/Reason for Call, type in Other and utilize the format Clearance MM/DD/YY or Clearance TBD. Do not use dashes or single digits. - If request is for dental extraction, please clarify the # of teeth to be extracted.  Request for surgical clearance:  1. What type of surgery is being performed? RIGHT ULNAR NERVE DECOMPRESSION ELBOW POSSIBLE TRANSPOSITION   2. When is this surgery scheduled? 04/21/21   3. What type of clearance is required (medical clearance vs. Pharmacy clearance to hold med vs. Both)? BOTH  4. Are there any medications that need to be held prior to surgery and how long? [place]   5. Practice name and name of physician performing surgery? [facility]; DR. Lennette Bihari KUZMA   6. What is the office phone number? [phone]   7.   What is the office fax number? [place].   Anesthesia type (None, local, MAC, general) ? CHOICE   Julaine Hua 03/09/2021, 5:18 PM  _________________________________________________________________   (provider comments below)

## 2021-03-10 NOTE — Telephone Encounter (Signed)
   Primary Cardiologist: [name], MD  Chart reviewed as part of pre-operative protocol coverage. Given past medical history and time since last visit, based on ACC/AHA guidelines, Erika Kim would be at acceptable risk for the planned procedure without further cardiovascular testing.   Patient with diagnosis of atrial fibrillation on Xarelto for anticoagulation.    Procedure: right ulnar nerve decompression elbow, possible transposition Date of procedure: 04/21/21   CHA2DS2-VASc Score = 4  This indicates a 4.8% annual risk of stroke. The patient's score is based upon: CHF History: No HTN History: Yes Diabetes History: Yes Stroke History: No Vascular Disease History: No Age Score: 1 Gender Score: 1   CrCl 113.8 (61.9 with IBW) Platelet count 162  Per office protocol, patient can hold Xarelto for 2 days prior to procedure.   Patient will not need bridging with Lovenox (enoxaparin) around procedure.   I will route this recommendation to the requesting party via Epic fax function and remove from pre-op pool.  Please call with questions.  Erika Kim. Erika Mcgowen NP-C    03/10/2021, 1:52 PM [facility] [place] [address] Office [phone] Fax [phone]

## 2021-03-10 NOTE — Telephone Encounter (Signed)
Patient with diagnosis of atrial fibrillation on Xarelto for anticoagulation.    Procedure: right ulnar nerve decompression elbow, possible transposition Date of procedure: 04/21/21   CHA2DS2-VASc Score = 4  This indicates a 4.8% annual risk of stroke. The patient's score is based upon: CHF History: No HTN History: Yes Diabetes History: Yes Stroke History: No Vascular Disease History: No Age Score: 1 Gender Score: 1   CrCl 113.8 (61.9 with IBW) Platelet count 162  Per office protocol, patient can hold Xarelto for 2 days prior to procedure.   Patient will not need bridging with Lovenox (enoxaparin) around procedure.

## 2021-03-14 ENCOUNTER — Ambulatory Visit: Payer: Medicare HMO | Admitting: Podiatry

## 2021-04-04 ENCOUNTER — Encounter (HOSPITAL_BASED_OUTPATIENT_CLINIC_OR_DEPARTMENT_OTHER): Payer: Self-pay | Admitting: Orthopedic Surgery

## 2021-04-04 ENCOUNTER — Other Ambulatory Visit: Payer: Self-pay

## 2021-04-18 ENCOUNTER — Encounter (HOSPITAL_BASED_OUTPATIENT_CLINIC_OR_DEPARTMENT_OTHER)
Admission: RE | Admit: 2021-04-18 | Discharge: 2021-04-18 | Disposition: A | Discharge status: Home / Self Care | Payer: Medicare HMO | Source: Ambulatory Visit | Attending: Orthopedic Surgery | Admitting: Orthopedic Surgery

## 2021-04-18 DIAGNOSIS — Z01812 Encounter for preprocedural laboratory examination: Secondary | ICD-10-CM | POA: Insufficient documentation

## 2021-04-18 LAB — BASIC METABOLIC PANEL
Anion gap: 12 (ref 5–15)
BUN: 10 mg/dL (ref 8–23)
CO2: 26 mmol/L (ref 22–32)
Calcium: 9.7 mg/dL (ref 8.9–10.3)
Chloride: 97 mmol/L — ABNORMAL LOW (ref 98–111)
Creatinine, Ser: 0.63 mg/dL (ref 0.44–1.00)
GFR, Estimated: >60 mL/min (ref >60)
Glucose, Bld: 79 mg/dL (ref 70–99)
Potassium: 4.7 mmol/L (ref 3.5–5.1)
Sodium: 135 mmol/L (ref 135–145)

## 2021-04-18 NOTE — Progress Notes (Signed)

## 2021-04-20 ENCOUNTER — Telehealth: Payer: Self-pay | Admitting: Neurology

## 2021-04-20 DIAGNOSIS — Z9989 Dependence on other enabling machines and devices: Secondary | ICD-10-CM

## 2021-04-20 NOTE — Telephone Encounter (Signed)
Pt has called back to inform Megan,RN that she has spoken with her DME and when she told them of the message about the motor soon to go out they advised her to have Dr send a new prescription sent over for a new devise.  Pleae call

## 2021-04-20 NOTE — Telephone Encounter (Signed)
Noted, and agree, thank you.

## 2021-04-20 NOTE — Addendum Note (Signed)
Addended by: Verlin Grills on: 04/20/2021 02:15 PM   Modules accepted: Orders

## 2021-04-20 NOTE — Telephone Encounter (Signed)
I have placed order for new machine. Sent order to DME.   Pt has been complaint with f/u and use from last visit 09/01/2020 states from Dr. Rexene Alberts: I reviewed her CPAP compliance data from 07/31/2020 through 08/29/2020, which is a total of 30 days, during which time she used her machine every night with percent use days greater than 4 hours at 100%, indicating superb compliance with an average usage of 7 hours and 23 minutes, residual AHI at goal at 0.7/h, leak on the low side with a 95th percentile at 3.2 L/min on a pressure of 8 cm with EPR of 2.  Set up date was 02/25/2014.  Pt sent my chart message on order being placed as well.

## 2021-04-20 NOTE — Telephone Encounter (Signed)
Pt called stating her CPAP machine is displaying a message, would like a call back to know what she should do.

## 2021-04-21 ENCOUNTER — Other Ambulatory Visit: Payer: Self-pay

## 2021-04-21 ENCOUNTER — Ambulatory Visit (HOSPITAL_BASED_OUTPATIENT_CLINIC_OR_DEPARTMENT_OTHER): Payer: Medicare HMO | Admitting: Certified Registered"

## 2021-04-21 ENCOUNTER — Ambulatory Visit (HOSPITAL_BASED_OUTPATIENT_CLINIC_OR_DEPARTMENT_OTHER)
Admission: RE | Admit: 2021-04-21 | Discharge: 2021-04-21 | Disposition: A | Discharge status: Home / Self Care | Payer: Medicare HMO | Attending: Orthopedic Surgery | Admitting: Orthopedic Surgery

## 2021-04-21 ENCOUNTER — Encounter (HOSPITAL_BASED_OUTPATIENT_CLINIC_OR_DEPARTMENT_OTHER)
Admission: RE | Disposition: A | Discharge status: Home / Self Care | Payer: Self-pay | Source: Home / Self Care | Attending: Orthopedic Surgery

## 2021-04-21 DIAGNOSIS — Z7901 Long term (current) use of anticoagulants: Secondary | ICD-10-CM | POA: Insufficient documentation

## 2021-04-21 DIAGNOSIS — Z880 Allergy status to penicillin: Secondary | ICD-10-CM | POA: Diagnosis not present

## 2021-04-21 DIAGNOSIS — Z7984 Long term (current) use of oral hypoglycemic drugs: Secondary | ICD-10-CM | POA: Insufficient documentation

## 2021-04-21 DIAGNOSIS — G5621 Lesion of ulnar nerve, right upper limb: Secondary | ICD-10-CM | POA: Insufficient documentation

## 2021-04-21 DIAGNOSIS — E119 Type 2 diabetes mellitus without complications: Secondary | ICD-10-CM | POA: Diagnosis not present

## 2021-04-21 DIAGNOSIS — Z96621 Presence of right artificial elbow joint: Secondary | ICD-10-CM | POA: Insufficient documentation

## 2021-04-21 DIAGNOSIS — Z96653 Presence of artificial knee joint, bilateral: Secondary | ICD-10-CM | POA: Diagnosis not present

## 2021-04-21 DIAGNOSIS — I10 Essential (primary) hypertension: Secondary | ICD-10-CM | POA: Diagnosis not present

## 2021-04-21 DIAGNOSIS — Z79899 Other long term (current) drug therapy: Secondary | ICD-10-CM | POA: Diagnosis not present

## 2021-04-21 DIAGNOSIS — Z881 Allergy status to other antibiotic agents status: Secondary | ICD-10-CM | POA: Diagnosis not present

## 2021-04-21 DIAGNOSIS — I480 Paroxysmal atrial fibrillation: Secondary | ICD-10-CM | POA: Diagnosis not present

## 2021-04-21 HISTORY — PX: ULNAR NERVE TRANSPOSITION: SHX2595

## 2021-04-21 LAB — GLUCOSE, CAPILLARY
Glucose-Capillary: 107 mg/dL — ABNORMAL HIGH (ref 70–99)
Glucose-Capillary: 83 mg/dL (ref 70–99)

## 2021-04-21 SURGERY — ULNAR NERVE DECOMPRESSION/TRANSPOSITION
Anesthesia: Monitor Anesthesia Care | Site: Elbow | Laterality: Right

## 2021-04-21 MED ORDER — OXYCODONE HCL 5 MG/5ML PO SOLN: 5.0000 mg | Freq: Once | ORAL | Status: DC | PRN

## 2021-04-21 MED ORDER — MIDAZOLAM HCL 2 MG/2ML IJ SOLN
INTRAMUSCULAR | Status: AC
  Filled 2021-04-21: qty 2

## 2021-04-21 MED ORDER — ROPIVACAINE HCL 5 MG/ML IJ SOLN
INTRAMUSCULAR | Status: DC | PRN
  Administered 2021-04-21: 30 mL via PERINEURAL

## 2021-04-21 MED ORDER — DEXMEDETOMIDINE (PRECEDEX) IN NS 20 MCG/5ML (4 MCG/ML) IV SYRINGE
PREFILLED_SYRINGE | INTRAVENOUS | Status: AC
  Filled 2021-04-21: qty 5

## 2021-04-21 MED ORDER — MIDAZOLAM HCL 2 MG/2ML IJ SOLN
2.0000 mg | Freq: Once | INTRAMUSCULAR | Status: AC
  Administered 2021-04-21: 2 mg via INTRAVENOUS

## 2021-04-21 MED ORDER — OXYCODONE HCL 5 MG PO TABS: 5.0000 mg | ORAL_TABLET | Freq: Once | ORAL | Status: DC | PRN

## 2021-04-21 MED ORDER — PROPOFOL 500 MG/50ML IV EMUL
INTRAVENOUS | Status: AC
  Filled 2021-04-21: qty 50

## 2021-04-21 MED ORDER — LACTATED RINGERS IV SOLN: INTRAVENOUS | Status: DC

## 2021-04-21 MED ORDER — PROPOFOL 500 MG/50ML IV EMUL
INTRAVENOUS | Status: DC | PRN
  Administered 2021-04-21: 50 ug/kg/min via INTRAVENOUS

## 2021-04-21 MED ORDER — FENTANYL CITRATE (PF) 100 MCG/2ML IJ SOLN
INTRAMUSCULAR | Status: AC
  Filled 2021-04-21: qty 2

## 2021-04-21 MED ORDER — HYDROMORPHONE HCL 1 MG/ML IJ SOLN
INTRAMUSCULAR | Status: AC
  Filled 2021-04-21: qty 0.5

## 2021-04-21 MED ORDER — PROPOFOL 10 MG/ML IV BOLUS
INTRAVENOUS | Status: AC
  Filled 2021-04-21: qty 20

## 2021-04-21 MED ORDER — PROMETHAZINE HCL 25 MG/ML IJ SOLN
6.2500 mg | INTRAMUSCULAR | Status: DC | PRN
Start: 2021-04-21 — End: 2021-04-21

## 2021-04-21 MED ORDER — ONDANSETRON HCL 4 MG/2ML IJ SOLN
INTRAMUSCULAR | Status: AC
  Filled 2021-04-21: qty 2

## 2021-04-21 MED ORDER — FENTANYL CITRATE (PF) 100 MCG/2ML IJ SOLN
INTRAMUSCULAR | Status: DC | PRN
  Administered 2021-04-21 (×2): 50 ug via INTRAVENOUS

## 2021-04-21 MED ORDER — CLINDAMYCIN PHOSPHATE 900 MG/50ML IV SOLN
INTRAVENOUS | Status: AC
  Filled 2021-04-21: qty 50

## 2021-04-21 MED ORDER — HYDROMORPHONE HCL 1 MG/ML IJ SOLN
0.2500 mg | INTRAMUSCULAR | Status: DC | PRN
  Administered 2021-04-21: 0.5 mg via INTRAVENOUS

## 2021-04-21 MED ORDER — FENTANYL CITRATE (PF) 100 MCG/2ML IJ SOLN
100.0000 ug | Freq: Once | INTRAMUSCULAR | Status: AC
  Administered 2021-04-21: 50 ug via INTRAVENOUS

## 2021-04-21 MED ORDER — CLINDAMYCIN PHOSPHATE 900 MG/50ML IV SOLN
900.0000 mg | INTRAVENOUS | Status: AC
  Administered 2021-04-21: 900 mg via INTRAVENOUS

## 2021-04-21 MED ORDER — ONDANSETRON HCL 4 MG/2ML IJ SOLN
INTRAMUSCULAR | Status: DC | PRN
  Administered 2021-04-21: 4 mg via INTRAVENOUS

## 2021-04-21 SURGICAL SUPPLY — 53 items
APL PRP STRL LF DISP 70% ISPRP (MISCELLANEOUS) ×1
BLADE MINI RND TIP GREEN BEAV (BLADE) IMPLANT
BLADE SURG 15 STRL LF DISP TIS (BLADE) ×2 IMPLANT
BLADE SURG 15 STRL SS (BLADE) ×4
BNDG CMPR 9X4 STRL LF SNTH (GAUZE/BANDAGES/DRESSINGS) ×1
BNDG ELASTIC 3X5.8 VLCR STR LF (GAUZE/BANDAGES/DRESSINGS) ×2 IMPLANT
BNDG ELASTIC 4X5.8 VLCR STR LF (GAUZE/BANDAGES/DRESSINGS) ×2 IMPLANT
BNDG ESMARK 4X9 LF (GAUZE/BANDAGES/DRESSINGS) ×2 IMPLANT
BNDG GAUZE ELAST 4 BULKY (GAUZE/BANDAGES/DRESSINGS) ×2 IMPLANT
CHLORAPREP W/TINT 26 (MISCELLANEOUS) ×2 IMPLANT
CORD BIPOLAR FORCEPS 12FT (ELECTRODE) ×2 IMPLANT
COVER BACK TABLE 60X90IN (DRAPES) ×2 IMPLANT
COVER MAYO STAND STRL (DRAPES) ×2 IMPLANT
CUFF TOURN SGL QUICK 18X3 (MISCELLANEOUS) ×2 IMPLANT
CUFF TOURN SGL QUICK 18X4 (TOURNIQUET CUFF) ×2 IMPLANT
DECANTER SPIKE VIAL GLASS SM (MISCELLANEOUS) IMPLANT
DRAPE EXTREMITY T 121X128X90 (DISPOSABLE) ×2 IMPLANT
DRAPE SURG 17X23 STRL (DRAPES) ×2 IMPLANT
DRSG PAD ABDOMINAL 8X10 ST (GAUZE/BANDAGES/DRESSINGS) ×2 IMPLANT
GAUZE 4X4 16PLY ~~LOC~~+RFID DBL (SPONGE) IMPLANT
GAUZE SPONGE 4X4 12PLY STRL (GAUZE/BANDAGES/DRESSINGS) ×2 IMPLANT
GAUZE XEROFORM 1X8 LF (GAUZE/BANDAGES/DRESSINGS) ×2 IMPLANT
GLOVE SRG 8 PF TXTR STRL LF DI (GLOVE) ×1 IMPLANT
GLOVE SURG ENC MOIS LTX SZ7.5 (GLOVE) ×2 IMPLANT
GLOVE SURG ORTHO LTX SZ8 (GLOVE) ×2 IMPLANT
GLOVE SURG UNDER POLY LF SZ8 (GLOVE) ×2
GLOVE SURG UNDER POLY LF SZ8.5 (GLOVE) ×2 IMPLANT
GOWN STRL REUS W/ TWL LRG LVL3 (GOWN DISPOSABLE) ×1 IMPLANT
GOWN STRL REUS W/TWL LRG LVL3 (GOWN DISPOSABLE) ×2
GOWN STRL REUS W/TWL XL LVL3 (GOWN DISPOSABLE) ×4 IMPLANT
NEEDLE HYPO 25X1 1.5 SAFETY (NEEDLE) IMPLANT
NS IRRIG 1000ML POUR BTL (IV SOLUTION) ×2 IMPLANT
PACK BASIN DAY SURGERY FS (CUSTOM PROCEDURE TRAY) ×2 IMPLANT
PAD CAST 3X4 CTTN HI CHSV (CAST SUPPLIES) ×1 IMPLANT
PAD CAST 4YDX4 CTTN HI CHSV (CAST SUPPLIES) ×1 IMPLANT
PADDING CAST ABS 4INX4YD NS (CAST SUPPLIES) ×1
PADDING CAST ABS COTTON 4X4 ST (CAST SUPPLIES) ×1 IMPLANT
PADDING CAST COTTON 3X4 STRL (CAST SUPPLIES) ×2
PADDING CAST COTTON 4X4 STRL (CAST SUPPLIES) ×2
SLEEVE SCD COMPRESS KNEE MED (STOCKING) ×2 IMPLANT
SPLINT FAST PLASTER 5X30 (CAST SUPPLIES)
SPLINT PLASTER CAST FAST 5X30 (CAST SUPPLIES) IMPLANT
SPLINT PLASTER CAST XFAST 3X15 (CAST SUPPLIES) IMPLANT
SPLINT PLASTER XTRA FASTSET 3X (CAST SUPPLIES)
STOCKINETTE 4X48 STRL (DRAPES) ×2 IMPLANT
SUT ETHILON 4 0 PS 2 18 (SUTURE) ×2 IMPLANT
SUT VIC AB 2-0 SH 27 (SUTURE) ×2
SUT VIC AB 2-0 SH 27XBRD (SUTURE) ×1 IMPLANT
SUT VICRYL 4-0 PS2 18IN ABS (SUTURE) IMPLANT
SYR BULB EAR ULCER 3OZ GRN STR (SYRINGE) ×2 IMPLANT
SYR CONTROL 10ML LL (SYRINGE) IMPLANT
TOWEL GREEN STERILE FF (TOWEL DISPOSABLE) ×2 IMPLANT
UNDERPAD 30X36 HEAVY ABSORB (UNDERPADS AND DIAPERS) ×2 IMPLANT

## 2021-04-21 NOTE — Op Note (Signed)
NAME: Erika Kim MEDICAL RECORD NO: [id number] DATE OF BIRTH: 1946/01/16 FACILITY: [facility] LOCATION: [facility] PHYSICIAN: Tennis Must, MD   OPERATIVE REPORT   DATE OF PROCEDURE: 04/21/21    PREOPERATIVE DIAGNOSIS: Right ulnar nerve compression at the elbow   POSTOPERATIVE DIAGNOSIS: Ulnar nerve compression at the elbow   PROCEDURE: Right ulnar nerve decompression at the elbow   SURGEON:  Leanora Cover, M.D.   ASSISTANT: Daryll Brod, MD   ANESTHESIA:  Regional with sedation   INTRAVENOUS FLUIDS:  Per anesthesia flow sheet.   ESTIMATED BLOOD LOSS:  Minimal.   COMPLICATIONS:  None.   SPECIMENS:  none   TOURNIQUET TIME:    Total Tourniquet Time Documented: Upper Arm (Right) - 31 minutes Total: Upper Arm (Right) - 31 minutes    DISPOSITION:  Stable to PACU.   INDICATIONS: 75 year old female with numbness and tingling left hand.  Positive nerve conduction studies for ulnar nerve compression at the elbow.  She wishes to undergo right ulnar nerve decompression at the elbow with possible transposition.  Risks, benefits and alternatives of surgery were discussed including the risks of blood loss, infection, damage to nerves, vessels, tendons, ligaments, bone for surgery, need for additional surgery, complications with wound healing, continued pain,  stiffness.  She voiced understanding of these risks and elected to proceed.  OPERATIVE COURSE:  After being identified preoperatively by myself,  the patient and I agreed on the procedure and site of the procedure.  The surgical site was marked.  Surgical consent had been signed. She was given IV antibiotics as preoperative antibiotic prophylaxis. She was transferred to the operating room and placed on the operating table in supine position with the Right upper extremity on an arm board.  Sedation was induced by the anesthesiologist. A regional block had been performed by anesthesia in preoperative holding.    Right  upper extremity was prepped and draped in normal sterile orthopedic fashion.  A surgical pause was performed between the surgeons, anesthesia, and operating room staff and all were in agreement as to the patient, procedure, and site of procedure.  Tourniquet at the proximal aspect of the extremity was inflated to 250 mmHg after exsanguination of the arm with an Esmarch bandage.  Incision was made at the medial side of the elbow.  This is carried in subcutaneous tissues by spreading technique.  Bipolar electrocautery was used to obtain hemostasis.  There was a thick fatty layer.  The ulnar nerve was identified proximal to Osborne's ligament.  Was carefully protected.  Osborne's ligament was sharply incised with the scissors.  The nerve was decompressed under direct visualization distally.  The muscular fascia was released over the FCU muscle and the FCU muscle belly spread.  Motor branch was identified and was protected.  The investing fascia surrounding the nerve was released under direct visualization.  The nerve was then decompressed proximally.  Again the muscular fascia was released.  Bipolar electrocautery was used to obtain hemostasis.  The investing fascia over the nerve was then released under direct visualization.  The elbow was placed into full flexion.  The nerve did not subluxate.  The nerve was noted to be swollen.  The wound was then copiously irrigated with sterile saline.  Osborne's ligaments anterior leaflet was repaired to the subcutaneous tissues anteriorly to provide a bolster to subluxation.  Inverted interrupted 2-0 Vicryl sutures were placed in subcutaneous tissues as well as 4-0 Vicryl suture in an inverted interrupted fashion.  The skin  was closed with 3-0 nylon in a horizontal mattress fashion.  The wound was dressed with sterile Xeroform 4 x 4's and ABD and wrapped with Kerlix and Ace bandage the tourniquet was deflated at 31 minutes.  Fingertips were pink with brisk capillary refill after  deflation of tourniquet.  The operative  drapes were broken down.  The patient was awoken from anesthesia safely.  She was transferred back to the stretcher and taken to PACU in stable condition.  I will see her back in the office in 1 week for postoperative followup.  She states she has pain medications at home and plans to take these.  She did not require a prescription.   Leanora Cover, MD Electronically signed, 04/21/21

## 2021-04-21 NOTE — Anesthesia Postprocedure Evaluation (Signed)
Anesthesia Post Note  Patient: AHRIANA GUNKEL  Procedure(s) Performed: RIGHT ULNAR NERVE DECOMPRESSION ELBOW (Right: Elbow)     Patient location during evaluation: PACU Anesthesia Type: Regional and MAC Level of consciousness: awake and alert Pain management: pain level controlled Vital Signs Assessment: post-procedure vital signs reviewed and stable Respiratory status: spontaneous breathing, nonlabored ventilation and respiratory function stable Cardiovascular status: blood pressure returned to baseline and stable Postop Assessment: no apparent nausea or vomiting Anesthetic complications: no   No notable events documented.  Last Vitals:  Vitals:   04/21/21 1411 04/21/21 1430  BP: (!) 146/70 136/65  Pulse: 71 74  Resp: 17 16  Temp:  36.4 C  SpO2: 93% 93%    Last Pain:  Vitals:   04/21/21 1425  TempSrc:   PainSc: 2                  Lynda Rainwater

## 2021-04-21 NOTE — Anesthesia Procedure Notes (Signed)
Anesthesia Regional Block: Supraclavicular block   Pre-Anesthetic Checklist: , timeout performed,  Correct Patient, Correct Site, Correct Laterality,  Correct Procedure, Correct Position, site marked,  Risks and benefits discussed,  Surgical consent,  Pre-op evaluation,  At surgeon's request and post-op pain management  Laterality: Right  Prep: chloraprep       Needles:  Injection technique: Single-shot  Needle Type: Stimiplex     Needle Length: 9cm  Needle Gauge: 21     Additional Needles:   Procedures:,,,, ultrasound used (permanent image in chart),,    Narrative:  Start time: 04/21/2021 12:12 PM End time: 04/21/2021 12:17 PM Injection made incrementally with aspirations every 5 mL.  Performed by: Personally  Anesthesiologist: Lynda Rainwater, MD

## 2021-04-21 NOTE — Anesthesia Preprocedure Evaluation (Signed)
Anesthesia Evaluation  Patient identified by MRN, date of birth, ID band Patient awake    Reviewed: Allergy & Precautions, NPO status , Patient's Chart, lab work & pertinent test results, reviewed documented beta blocker date and time   Airway Mallampati: II  TM Distance: >3 FB Neck ROM: Full    Dental  (+) Caps,  Lower bridge:   Pulmonary sleep apnea and Continuous Positive Airway Pressure Ventilation ,    Pulmonary exam normal breath sounds clear to auscultation       Cardiovascular hypertension, Pt. on home beta blockers and Pt. on medications Normal cardiovascular exam+ dysrhythmias (s/p ablation in xarelto, last dose Saturday 6pm ) Atrial Fibrillation  Rhythm:Regular Rate:Normal  Stress Test 06/2018 Blood pressure demonstrated a normal response to exercise. There was no ST segment deviation noted during stress. Negative ETT but patient only achieved 78% of PMHR at 118 bpm Normal hemodynamic response Rare PVCls with stress   TTE 2017 - Left ventricle: The cavity size was normal. Systolic function was normal. The estimated ejection fraction was in the range of 60% to 65%. Wall motion was normal; there were no regional wall motion abnormalities. The transmitral flow pattern was normal.Left ventricular diastolic function parameters were normal.  - Aortic valve: Moderate focal calcification, consistent with  sclerosis. There was mild regurgitation.  - Mitral valve: There was mild regurgitation.   Neuro/Psych negative neurological ROS  negative psych ROS   GI/Hepatic negative GI ROS, Neg liver ROS,   Endo/Other  negative endocrine ROSdiabetes, Type 2, Oral Hypoglycemic Agents  Renal/GU negative Renal ROS  negative genitourinary   Musculoskeletal  (+) Arthritis ,   Abdominal (+) + obese,   Peds  Hematology negative hematology ROS (+)   Anesthesia Other Findings   Reproductive/Obstetrics                              Anesthesia Physical  Anesthesia Plan  ASA: III  Anesthesia Plan: Regional and MAC   Post-op Pain Management:    Induction: Intravenous  PONV Risk Score and Plan: 2 and Midazolam, Ondansetron and Treatment may vary due to age or medical condition  Airway Management Planned: Simple Face Mask  Additional Equipment:   Intra-op Plan:   Post-operative Plan:   Informed Consent: I have reviewed the patients History and Physical, chart, labs and discussed the procedure including the risks, benefits and alternatives for the proposed anesthesia with the patient or authorized representative who has indicated his/her understanding and acceptance.     Dental advisory given  Plan Discussed with: CRNA  Anesthesia Plan Comments:         Anesthesia Quick Evaluation

## 2021-04-21 NOTE — Transfer of Care (Signed)
Immediate Anesthesia Transfer of Care Note  Patient: Erika Kim  Procedure(s) Performed: RIGHT ULNAR NERVE DECOMPRESSION ELBOW (Right: Elbow)  Patient Location: PACU  Anesthesia Type:MAC combined with regional for post-op pain  Level of Consciousness: awake  Airway & Oxygen Therapy: Patient Spontanous Breathing and Patient connected to face mask oxygen  Post-op Assessment: Report given to RN and Post -op Vital signs reviewed and stable  Post vital signs: Reviewed and stable  Last Vitals:  Vitals Value Taken Time  BP 126/63 04/21/21 1347  Temp    Pulse 72 04/21/21 1348  Resp 20 04/21/21 1348  SpO2 97 % 04/21/21 1348  Vitals shown include unvalidated device data.  Last Pain:  Vitals:   04/21/21 1043  TempSrc: Oral  PainSc: 0-No pain      Patients Stated Pain Goal: 3 (16/10/96 0454)  Complications: No notable events documented.

## 2021-04-21 NOTE — Op Note (Signed)
I assisted Surgeon(s) and Role:    * Leanora Cover, MD - Primary    Daryll Brod, MD - Assisting on the Procedure(s): [place] on 04/21/2021.  I provided assistance on this case as follows: Set up, approach, retraction, identification of the nerve, decompression neurolysis of the nerve, closure of the wound, application dressing.  Electronically signed by: Daryll Brod, MD Date: 04/21/2021 Time: 1:46 PM

## 2021-04-21 NOTE — Progress Notes (Signed)
Assisted Dr. Miller with right, ultrasound guided, supraclavicular block. Side rails up, monitors on throughout procedure. See vital signs in flow sheet. Tolerated Procedure well. 

## 2021-04-21 NOTE — H&P (Signed)
Erika Kim is an 75 y.o. female.   Chief Complaint: right ulnar neuropathy HPI: 75 yo female with numbness and tingling right hand.  Positive nerve conduction studies. She wishes to have right ulnar nerve decompression.  Allergies:  Allergies  Allergen Reactions   Augmentin [Amoxicillin-Pot Clavulanate] Itching    severe     Cephalosporins Itching    severe    Past Medical History:  Diagnosis Date   Acoustic neuroma ([place]) followed by dr brown at [facility]   right side w/ chronic roaring noise   Anemia    hx of 2019 realted to Xarelto    Anticoagulant long-term use    xarelto   Benign paroxysmal positional vertigo    Dysrhythmia    afib- 2 ablations followed by Dr Marlou Porch - [facility] - 4/21    Endometrial polyp    Family history of adverse reaction to anesthesia    daughter- nausea    Frequency of urination    Hx of transfusion of packed red blood cells    related to small intestine in 2019    Hyperlipidemia    Hypertension    OA (osteoarthritis)    left knee   OSA on CPAP    cpap - setting at ? 8    PAF (paroxysmal atrial fibrillation) [facility]) cardiologist-  dr Marlou Porch   dx 04/ 2017   PMB (postmenopausal bleeding)    Pneumonia    walking pneumonia -age 9    TMJ (temporomandibular joint disorder)    left side-- wears guard   Type 2 diabetes mellitus ([place])    Wears contact lenses     Past Surgical History:  Procedure Laterality Date   ABDOMINAL HYSTERECTOMY     ATRIAL FIBRILLATION ABLATION  05/29/2019   ATRIAL FIBRILLATION ABLATION N/A 05/29/2019   Procedure: ATRIAL FIBRILLATION ABLATION;  Surgeon: [name], MD;  Location: [facility];  Service: Cardiovascular;  Laterality: N/A;   ATRIAL FIBRILLATION ABLATION N/A 10/29/2019   Procedure: ATRIAL FIBRILLATION ABLATION;  Surgeon: [name], MD;  Location: [place] [facility];  Service: Cardiovascular;  Laterality: N/A;   CARDIOVERSION N/A 06/24/2019   Procedure: CARDIOVERSION;  Surgeon: Skeet Latch, MD;  Location: [facility];  Service: Cardiovascular;  Laterality: N/A;   CARPAL TUNNEL RELEASE Right 04/17/2007   w/ Excision ganglion cyst and Pulley Release right thumb   DILATATION & CURETTAGE/HYSTEROSCOPY WITH MYOSURE N/A 01/12/2017   Procedure: DILATATION & CURETTAGE/HYSTEROSCOPY WITH MYOSURE;  Surgeon: Arvella Nigh, MD;  Location: [place];  Service: Gynecology;  Laterality: N/A;   ESOPHAGOGASTRODUODENOSCOPY (EGD) WITH PROPOFOL N/A 09/16/2018   Procedure: ESOPHAGOGASTRODUODENOSCOPY (EGD) WITH PROPOFOL;  Surgeon: Ronald Lobo, MD;  Location: [place];  Service: Endoscopy;  Laterality: N/A;   GIVENS CAPSULE STUDY N/A 09/16/2018   Procedure: GIVENS CAPSULE STUDY;  Surgeon: Ronald Lobo, MD;  Location: [place];  Service: Endoscopy;  Laterality: N/A;   HAMMER TOE SURGERY Left 2010   left second toe   IR RADIOLOGY PERIPHERAL GUIDED IV START  10/27/2019   IR US GUIDE VASC ACCESS RIGHT  10/27/2019   KNEE ARTHROSCOPY Right 2010   KNEE SURGERY Left 1993   LYMPH NODE BIOPSY N/A 03/01/2017   Procedure: SENTINEL LYMPH NODE BIOPSY;  Surgeon: Everitt Amber, MD;  Location: [facility];  Service: Gynecology;  Laterality: N/A;   NASAL SINUS SURGERY  1985 and 1995   ROBOTIC ASSISTED TOTAL HYSTERECTOMY WITH BILATERAL SALPINGO OOPHERECTOMY Bilateral 03/01/2017   Procedure: XI ROBOTIC ASSISTED TOTAL HYSTERECTOMY  WITH BILATERAL SALPINGO OOPHORECTOMY;  Surgeon: Everitt Amber, MD;  Location: [facility];  Service: Gynecology;  Laterality: Bilateral;   ROTATOR CUFF REPAIR Right 04/2004   TOE SURGERY Right 2012   Great toe and second toe   TOTAL ELBOW REPLACEMENT Right 2000   TOTAL KNEE ARTHROPLASTY Right 01/12/2010   TOTAL KNEE ARTHROPLASTY Left 03/02/2020   Procedure: TOTAL KNEE ARTHROPLASTY;  Surgeon: Renette Butters, MD;  Location: [facility];  Service: Orthopedics;  Laterality: Left;   TRANSTHORACIC ECHOCARDIOGRAM  03/28/2016   ef 60-65%/ mild AR and MR   TUBAL LIGATION Bilateral 1978     Family History: Family History  Problem Relation Age of Onset   Heart failure Father    Heart failure Mother     Social History:   reports that she has never smoked. She has never used smokeless tobacco. She reports that she does not drink alcohol and does not use drugs.  Medications: Medications Prior to Admission  Medication Sig Dispense Refill   atorvastatin (LIPITOR) 40 MG tablet Take 40 mg by mouth every morning.      betamethasone valerate (VALISONE) 0.1 % cream 2 (two) times a week.      Cholecalciferol (VITAMIN D3) 5000 units TABS Take 5,000 Units by mouth daily.      Cranberry-Vitamin C (CRANBERRY CONCENTRATE/VITAMINC PO) Take 1 tablet by mouth daily.      docusate sodium (COLACE) 100 MG capsule Take 100 mg by mouth daily.     hydrochlorothiazide (HYDRODIURIL) 12.5 MG tablet Take 12.5 mg by mouth daily.      ipratropium (ATROVENT) 0.03 % nasal spray Place 2 sprays into both nostrils every 12 (twelve) hours as needed for rhinitis.     JANUVIA 100 MG tablet Take 100 mg by mouth daily after supper.      metoprolol succinate (TOPROL-XL) 50 MG 24 hr tablet Take 1 tablet (50 mg total) by mouth in the morning and at bedtime. Take with or immediately following a meal. 180 tablet 1   Multiple Vitamin (MULTIVITAMIN) tablet Take 1 tablet by mouth daily.     olmesartan (BENICAR) 20 MG tablet Take 20 mg by mouth daily.     ONE TOUCH ULTRA TEST test strip      Polyethyl Glycol-Propyl Glycol (SYSTANE OP) Place 1 drop into both eyes daily as needed (dry eyes).     rivaroxaban (XARELTO) 20 MG TABS tablet TAKE ONE TABLET BY MOUTH ONE TIME DAILY with supper 90 tablet 1   saline (AYR) GEL Place 1 application into the nose at bedtime.     triamcinolone cream (KENALOG) 0.1 % as needed for itching.     Wheat Dextrin (BENEFIBER PO) Take by mouth.     doxycycline (VIBRA-TABS) 100 MG tablet Take 1 tablet (100 mg total) by mouth 2 (two) times daily. (Patient taking differently: Take 100 mg by mouth  2 (two) times daily. FOR DENTAL PROCEDURES) 20 tablet 0   Lancets (ONETOUCH DELICA PLUS [id number]) MISC       No results found for this or any previous visit (from the past 48 hour(s)).  No results found.   A comprehensive review of systems was negative.  Height 5\' 3"  (1.6 m), weight 94.8 kg.  General appearance: alert, cooperative, and appears stated age Head: Normocephalic, without obvious abnormality, atraumatic Neck: supple, symmetrical, trachea midline Cardio: regular rate and rhythm Resp: clear to auscultation bilaterally Extremities: Intact sensation and capillary refill all digits.  +epl/fpl/io.  No wounds.  Pulses: 2+ and symmetric  Skin: Skin color, texture, turgor normal. No rashes or lesions Neurologic: Grossly normal Incision/Wound: none  Assessment/Plan Right ulnar nerve compression at the elbow.  Non operative and operative treatment options have been discussed with the patient and patient wishes to proceed with operative treatment. Plan ulnar nerve decompression at elbow with possible transposition.  Risks, benefits, and alternatives of surgery have been discussed and the patient agrees with the plan of care.   Leanora Cover 04/21/2021, 10:20 AM

## 2021-04-21 NOTE — Discharge Instructions (Addendum)

## 2021-04-24 ENCOUNTER — Encounter (HOSPITAL_BASED_OUTPATIENT_CLINIC_OR_DEPARTMENT_OTHER): Payer: Self-pay | Admitting: Orthopedic Surgery

## 2021-04-26 ENCOUNTER — Telehealth: Payer: Self-pay | Admitting: Cardiology

## 2021-04-26 NOTE — Telephone Encounter (Signed)
PT is calling requesting a callback from the nurse for the episodes she had over the weekend

## 2021-04-26 NOTE — Telephone Encounter (Signed)
Saturday night, Sunday and yesterday she was in atrial fib.  Her watch picked it up and her kardia mobile device showed poss afib. The rates were up to 129-140.  143/94.  She felt pretty well other than sometimes felt heart quivering.  She took an extra metoprolol yesterday as she was instructed by Dr. Curt Bears.  Today her watch shows normal rhythm.   Rate is 72 right now.    Had a pinched nerve in elbow had a surgical procedure Thursday to fix it.  She is not sure what they did exactly.  She will continue to monitor and take one extra Toprol XL as needed for afib episode.  Pt aware I will forward to Dr. Curt Bears and Dr. Marlou Porch to let them know.

## 2021-04-27 NOTE — Telephone Encounter (Signed)
Followed up with pt. Reports recent arm surgery last week, repair of pinched nerve.  Experienced some afib afterwards, took extra Toprol for 3 days over the weekend, hasn't had any in the last several days. Pt will continue monitoring for now and let us know if reoccurs and/or has to take extra Toprol again.

## 2021-05-05 DIAGNOSIS — L812 Freckles: Secondary | ICD-10-CM | POA: Diagnosis not present

## 2021-05-05 DIAGNOSIS — D229 Melanocytic nevi, unspecified: Secondary | ICD-10-CM | POA: Diagnosis not present

## 2021-05-05 DIAGNOSIS — L570 Actinic keratosis: Secondary | ICD-10-CM | POA: Diagnosis not present

## 2021-05-05 DIAGNOSIS — L814 Other melanin hyperpigmentation: Secondary | ICD-10-CM | POA: Diagnosis not present

## 2021-05-05 DIAGNOSIS — L821 Other seborrheic keratosis: Secondary | ICD-10-CM | POA: Diagnosis not present

## 2021-06-29 ENCOUNTER — Other Ambulatory Visit: Payer: Self-pay | Admitting: Cardiology

## 2021-06-29 NOTE — Telephone Encounter (Signed)
Prescription refill request for Xarelto received.  Indication: afib  Last office visit: 01/31/2021, [place] Weight: 96.1 kg  Age: 75 yo  Scr: 0.63, 04/18/2021 CrCl: 133m/min   Refill sent.

## 2021-07-04 DIAGNOSIS — G4733 Obstructive sleep apnea (adult) (pediatric): Secondary | ICD-10-CM | POA: Diagnosis not present

## 2021-07-06 ENCOUNTER — Ambulatory Visit: Payer: Medicare HMO | Admitting: Cardiology

## 2021-07-16 ENCOUNTER — Other Ambulatory Visit: Payer: Self-pay | Admitting: Cardiology

## 2021-07-18 DIAGNOSIS — Z124 Encounter for screening for malignant neoplasm of cervix: Secondary | ICD-10-CM | POA: Diagnosis not present

## 2021-07-18 DIAGNOSIS — Z6838 Body mass index (BMI) 38.0-38.9, adult: Secondary | ICD-10-CM | POA: Diagnosis not present

## 2021-08-03 IMAGING — CT CT HEART MORPH/PULM VEIN W/ CM & W/O CA SCORE
2 of 6 series · 10 of 20 positions shown, 12 images · IV contrast (APPLIED)
Comparison: 05/22/2019
COMPARISON: 05/22/2019

Addendum:
EXAM:
OVER-READ INTERPRETATION  CT CHEST

The following report is an over-read performed by radiologist Dr.
over-read does not include interpretation of cardiac or coronary
anatomy or pathology. The coronary CTA interpretation by the
cardiologist is attached.
CLINICAL DATA: Atrial fibrillation scheduled for an ablation. S/p
ablation 05/29/2019 with ERAF.
Cardiac CT/CTA
TECHNIQUE: The patient was scanned on a Siemens Somatom scanner.

[Series 13: best diast · axial · 0.34mm/px · z∈[-214,-123]mm · 5 of 341 slices shown, 7 images]
[im 57/341  vessel]
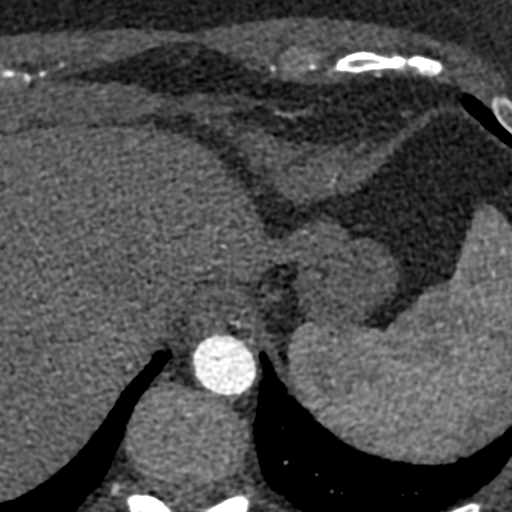
[im 57/341  lung]
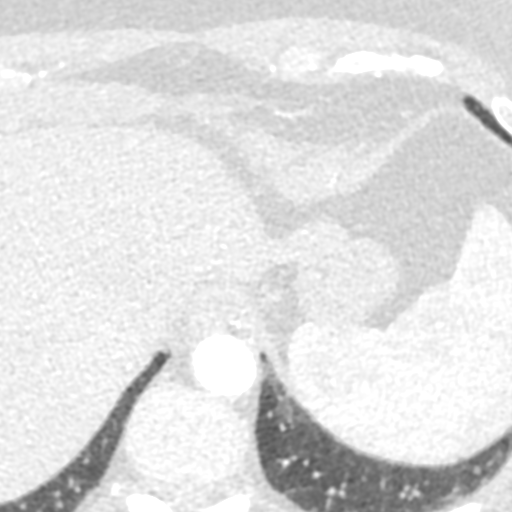
[im 114/341  vessel]
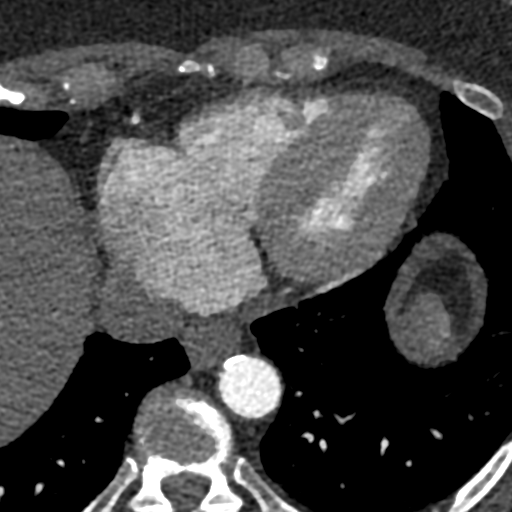
[im 171/341  vessel]
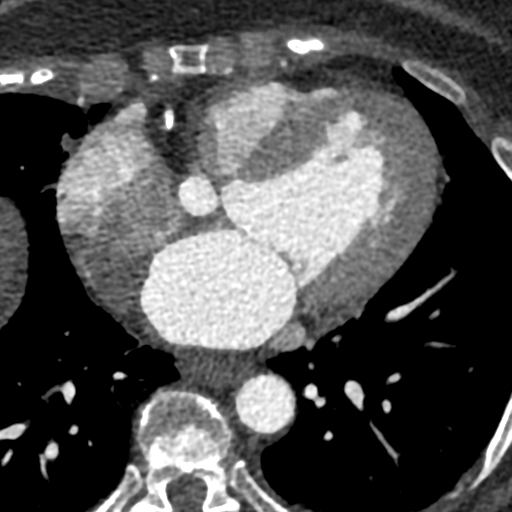
[im 227/341  vessel]
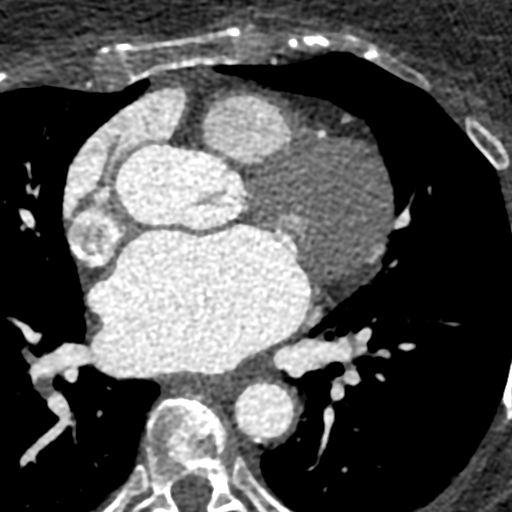
[im 284/341  vessel]
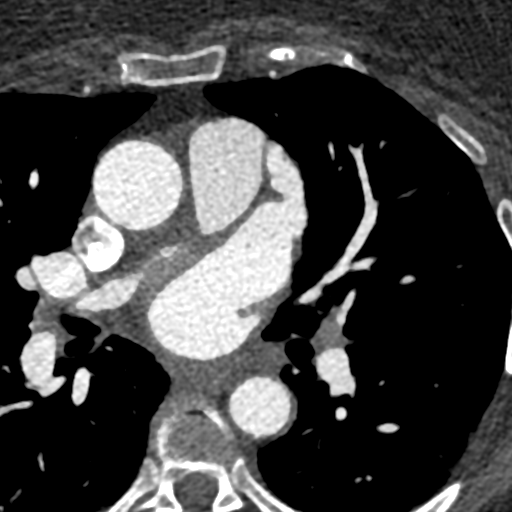
[im 284/341  lung]
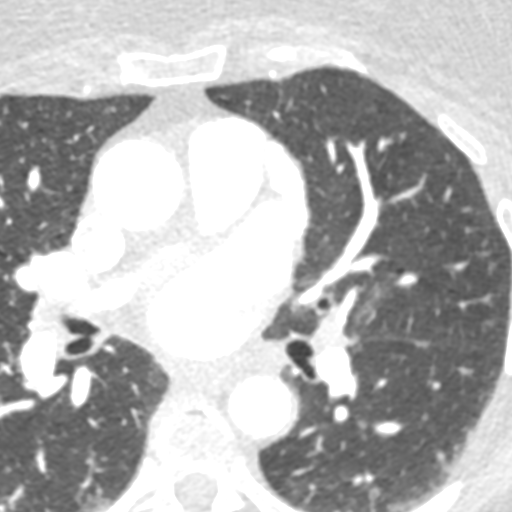

[Series 15: +300 ms · axial · 0.34mm/px · z∈[-214,-123]mm · 5 of 341 slices shown]
[im 57/341  vessel]
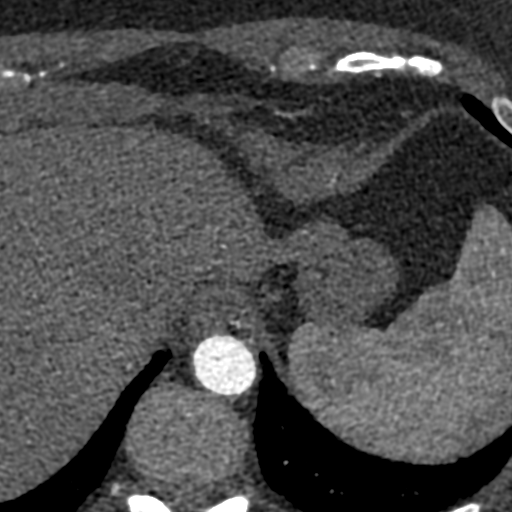
[im 114/341  vessel]
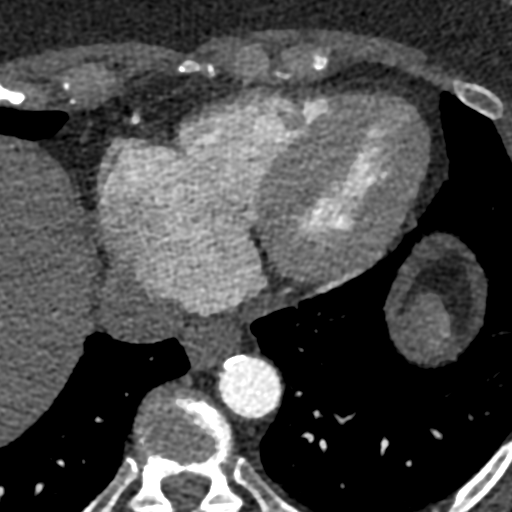
[im 171/341  vessel]
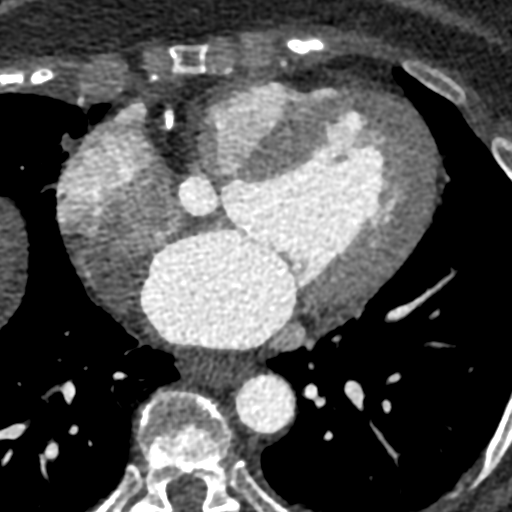
[im 227/341  vessel]
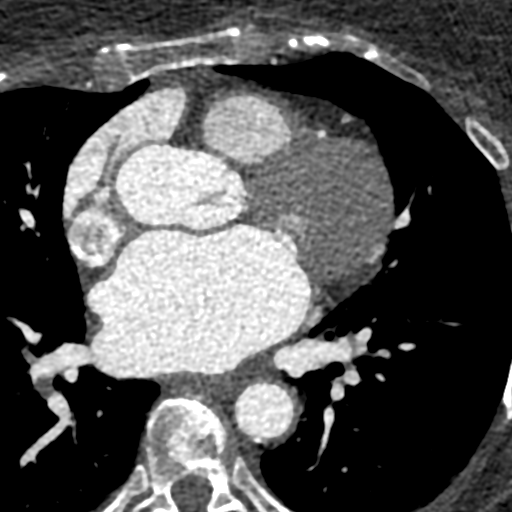
[im 284/341  vessel]
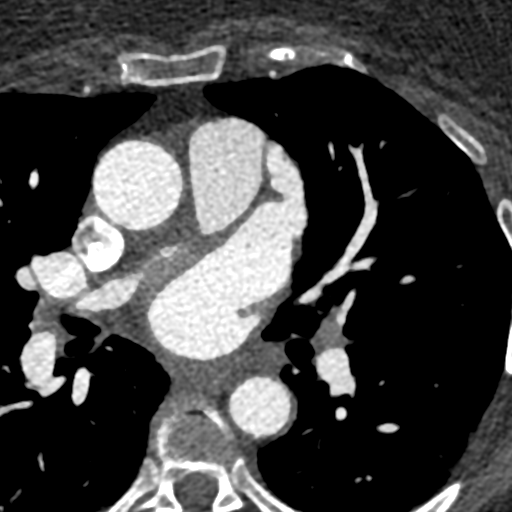

[10 of 20 positions shown; findings below may reference images not displayed]

FINDINGS: Vascular: No incidental findings.

Mediastinum/Nodes: Visualized mediastinum and hilar regions
demonstrate no lymphadenopathy or incidental masses.

Lungs/Pleura: Mild parenchymal scarring in the left lower lobe.
Visualized lungs show no evidence of pulmonary edema, consolidation,
pneumothorax, nodule or pleural fluid.

Upper Abdomen: No acute abnormality.

Musculoskeletal: No chest wall mass or suspicious bone lesions
identified.
IMPRESSION: No significant incidental findings.
FINDINGS: A 120 kV prospective scan was triggered in the descending thoracic
aorta at 111 HU's. Gantry rotation speed was 280 msecs and
collimation was .9 mm. No beta blockade and no NTG was given. The 3D
data set was reconstructed in 5% intervals of the 60-80 % of the R-R
cycle. Diastolic phases were analyzed on a dedicated work station
using MPR, MIP and VRT modes. The patient received 80 cc of
contrast.

There is normal pulmonary vein drainage into the left atrium (2 on
the right and 2 on the left) with ostial measurements as follows:

All veins widely patent.

RUPV: 20.3 x 14.5 mm

RLPV: 22.6 x 19.8 mm

LUPV:  21.3 x 11 mm

LLPV: 19.4 x 10.8 mm

No anomalous pulmonary venous drainage.

Normal left atrial appendage, no left atrial appendage thrombus. No
intracardiac mass or thrombus.

The esophagus runs adjacent to the left inferior pulmonary vein.

Aorta: Normal caliber, 31 mm at mid level. No dissection. Mild
calcifications in descending thoracic aorta.

Aortic Valve:  Trileaflet.  No calcifications.

Coronary Arteries: Normal coronary origin. Right dominance. The
study was performed without use of NTG and insufficient for plaque
evaluation. Coronary artery calcifications noted. Motion in the RCA
impacts the calcium scoring on today's exam. Calcium score 166, 77th
percentile. Unlikely to represent a decrease from the prior exam.

Pulmonary artery: Normal caliber main pulmonary artery.

Atria: Moderate LA enlargement, Mild RA enlargement.
IMPRESSION: 1. There is normal pulmonary vein drainage into the left atrium. No
pulmonary vein stenosis s/p ablation.

2. Normal left atrial appendage, no left atrial appendage thrombus.
No intracardiac mass or thrombus.

3. The esophagus runs adjacent to the left inferior pulmonary vein.

4.  No pericardial effusion.

*** End of Addendum ***
EXAM:
OVER-READ INTERPRETATION  CT CHEST

The following report is an over-read performed by radiologist Dr.
over-read does not include interpretation of cardiac or coronary
anatomy or pathology. The coronary CTA interpretation by the
cardiologist is attached.
FINDINGS: Vascular: No incidental findings.

Mediastinum/Nodes: Visualized mediastinum and hilar regions
demonstrate no lymphadenopathy or incidental masses.

Lungs/Pleura: Mild parenchymal scarring in the left lower lobe.
Visualized lungs show no evidence of pulmonary edema, consolidation,
pneumothorax, nodule or pleural fluid.

Upper Abdomen: No acute abnormality.

Musculoskeletal: No chest wall mass or suspicious bone lesions
identified.
IMPRESSION: No significant incidental findings.

## 2021-08-04 DIAGNOSIS — N958 Other specified menopausal and perimenopausal disorders: Secondary | ICD-10-CM | POA: Diagnosis not present

## 2021-08-04 DIAGNOSIS — D225 Melanocytic nevi of trunk: Secondary | ICD-10-CM | POA: Diagnosis not present

## 2021-08-04 DIAGNOSIS — M12851 Other specific arthropathies, not elsewhere classified, right hip: Secondary | ICD-10-CM | POA: Diagnosis not present

## 2021-08-04 DIAGNOSIS — M8588 Other specified disorders of bone density and structure, other site: Secondary | ICD-10-CM | POA: Diagnosis not present

## 2021-08-04 DIAGNOSIS — L918 Other hypertrophic disorders of the skin: Secondary | ICD-10-CM | POA: Diagnosis not present

## 2021-08-04 DIAGNOSIS — L738 Other specified follicular disorders: Secondary | ICD-10-CM | POA: Diagnosis not present

## 2021-08-04 DIAGNOSIS — M12841 Other specific arthropathies, not elsewhere classified, right hand: Secondary | ICD-10-CM | POA: Diagnosis not present

## 2021-08-04 DIAGNOSIS — M12842 Other specific arthropathies, not elsewhere classified, left hand: Secondary | ICD-10-CM | POA: Diagnosis not present

## 2021-08-04 DIAGNOSIS — M12852 Other specific arthropathies, not elsewhere classified, left hip: Secondary | ICD-10-CM | POA: Diagnosis not present

## 2021-08-09 ENCOUNTER — Other Ambulatory Visit: Payer: Self-pay

## 2021-08-09 ENCOUNTER — Encounter: Payer: Self-pay | Admitting: Podiatry

## 2021-08-09 ENCOUNTER — Ambulatory Visit: Payer: Medicare HMO | Admitting: Podiatry

## 2021-08-09 DIAGNOSIS — M722 Plantar fascial fibromatosis: Secondary | ICD-10-CM | POA: Diagnosis not present

## 2021-08-09 MED ORDER — TRIAMCINOLONE ACETONIDE 40 MG/ML IJ SUSP
20.0000 mg | Freq: Once | INTRAMUSCULAR | Status: AC
  Administered 2021-08-09: 20 mg

## 2021-08-09 NOTE — Progress Notes (Signed)
She presents today for follow-up of her neuritis in her feet states that they are doing just fine right now she states this left one is bothering me on the outside as she points to the fourth fifth tarsometatarsal joint area.  Objective: The dorsal aspect of the foot where the neuritis and capsulitis are always present is no longer painful today.  She is however experiencing pain along the fourth fifth tarsometatarsal joint of the left foot.  She has pain on palpation medial calcaneal tubercle of the left heel  Assessment capsulitis and Planter fasciitis.  Plan: Injected the left heel today due to Planter fasciitis we will follow-up with her in 6 months

## 2021-08-09 NOTE — Addendum Note (Signed)
Addended by: Clovis Riley E on: 08/09/2021 12:53 PM   Modules accepted: Orders

## 2021-08-16 ENCOUNTER — Ambulatory Visit: Payer: Medicare HMO | Admitting: Cardiology

## 2021-08-22 ENCOUNTER — Telehealth: Payer: Self-pay | Admitting: Neurology

## 2021-08-22 NOTE — Telephone Encounter (Signed)
LVM/mychart message asking patient to call back to reschedule appt due to Dr. Rexene Alberts being out.

## 2021-08-26 ENCOUNTER — Encounter: Payer: Self-pay | Admitting: Cardiology

## 2021-08-26 ENCOUNTER — Other Ambulatory Visit: Payer: Self-pay

## 2021-08-26 ENCOUNTER — Ambulatory Visit: Payer: Medicare HMO | Admitting: Cardiology

## 2021-08-26 DIAGNOSIS — I480 Paroxysmal atrial fibrillation: Secondary | ICD-10-CM | POA: Diagnosis not present

## 2021-08-26 DIAGNOSIS — D6869 Other thrombophilia: Secondary | ICD-10-CM | POA: Diagnosis not present

## 2021-08-26 DIAGNOSIS — I10 Essential (primary) hypertension: Secondary | ICD-10-CM

## 2021-08-26 DIAGNOSIS — E785 Hyperlipidemia, unspecified: Secondary | ICD-10-CM | POA: Diagnosis not present

## 2021-08-26 DIAGNOSIS — E1151 Type 2 diabetes mellitus with diabetic peripheral angiopathy without gangrene: Secondary | ICD-10-CM | POA: Diagnosis not present

## 2021-08-26 NOTE — Assessment & Plan Note (Signed)
2 separate atrial fibrillation ablations performed, repeat on 10/29/2019, Dr. Curt Bears.  Seems to be remaining in sinus rhythm for most of the time.  Her Fitbit sometimes shows rapid increase in heart rate that is nonsustained.  I wonder if this could be artifact.  Overall she does not feel any more atrial fibrillation.  Continue with current medications.

## 2021-08-26 NOTE — Patient Instructions (Signed)
Medication Instructions:  The current medical regimen is effective;  continue present plan and medications.  *If you need a refill on your cardiac medications before your next appointment, please call your pharmacy*  Follow-Up: At [facility], you and your health needs are our priority.  As part of our continuing mission to provide you with exceptional heart care, we have created designated Provider Care Teams.  These Care Teams include your primary Cardiologist (physician) and Advanced Practice Providers (APPs -  Physician Assistants and Nurse Practitioners) who all work together to provide you with the care you need, when you need it.  We recommend signing up for the patient portal called "MyChart".  Sign up information is provided on this After Visit Summary.  MyChart is used to connect with patients for Virtual Visits (Telemedicine).  Patients are able to view lab/test results, encounter notes, upcoming appointments, etc.  Non-urgent messages can be sent to your provider as well.   To learn more about what you can do with MyChart, go to [email or web address].    Your next appointment:   6 month(s)  The format for your next appointment:   In Person  Provider:   NP or PA  an   Thank you for choosing [facility]!!

## 2021-08-26 NOTE — Progress Notes (Signed)
Cardiology Office Note:    Date:  08/26/2021   ID:  Erika Kim, DOB 1945/12/16, MRN [id number]  PCP:  [name], MD   [place]  Cardiologist:  [name], MD  Advanced Practice Provider:  No care team member to display Electrophysiologist:  Will Meredith Leeds, MD      Referring MD: [name], MD   History of Present Illness:    Erika Kim is a 75 y.o. female here for the follow-up of atrial fibrillation  05/29/2019-cardioversion.  Had ablation repeat on 10/29/2019.  No return of atrial fibrillation.  Doing well.  No chest pain.  No recurrence of GI bleeding.  Colonoscopy reassuring recently.  Has acoustic neuroma, right sided hearing loss.  Followed at [facility].  Today: Overall she appears well.  She reports having a severe episode of atrial fibrillation over the July 4th weekend. Since then she has not been able to feel any recurrent episodes. However, she does notice that her Fit Bit detects atrial fibrillation maybe as frequently as once a day. This may occur while sitting and watching TV, for a few min at a time.  She denies any chest pain, or shortness of breath. No lightheadedness, headaches, syncope, orthopnea, PND, lower extremity edema or exertional symptoms.   Past Medical History:  Diagnosis Date   Acoustic neuroma ([place]) followed by dr brown at [facility]   right side w/ chronic roaring noise   Anemia    hx of 2019 realted to Xarelto    Anticoagulant long-term use    xarelto   Benign paroxysmal positional vertigo    Dysrhythmia    afib- 2 ablations followed by Dr Marlou Porch - [facility] - 4/21    Endometrial polyp    Family history of adverse reaction to anesthesia    daughter- nausea    Frequency of urination    Hx of transfusion of packed red blood cells    related to small intestine in 2019    Hyperlipidemia    Hypertension    OA (osteoarthritis)    left knee   OSA on CPAP    cpap - setting at ? 8    PAF (paroxysmal  atrial fibrillation) [facility]) cardiologist-  dr Marlou Porch   dx 04/ 2017   PMB (postmenopausal bleeding)    Pneumonia    walking pneumonia -age 9    TMJ (temporomandibular joint disorder)    left side-- wears guard   Type 2 diabetes mellitus ([place])    Wears contact lenses     Past Surgical History:  Procedure Laterality Date   ABDOMINAL HYSTERECTOMY     ATRIAL FIBRILLATION ABLATION  05/29/2019   ATRIAL FIBRILLATION ABLATION N/A 05/29/2019   Procedure: ATRIAL FIBRILLATION ABLATION;  Surgeon: [name], MD;  Location: [place] [facility];  Service: Cardiovascular;  Laterality: N/A;   ATRIAL FIBRILLATION ABLATION N/A 10/29/2019   Procedure: ATRIAL FIBRILLATION ABLATION;  Surgeon: [name], MD;  Location: [place] [facility];  Service: Cardiovascular;  Laterality: N/A;   CARDIOVERSION N/A 06/24/2019   Procedure: CARDIOVERSION;  Surgeon: Skeet Latch, MD;  Location: [place];  Service: Cardiovascular;  Laterality: N/A;   CARPAL TUNNEL RELEASE Right 04/17/2007   w/ Excision ganglion cyst and Pulley Release right thumb   DILATATION & CURETTAGE/HYSTEROSCOPY WITH MYOSURE N/A 01/12/2017   Procedure: DILATATION & CURETTAGE/HYSTEROSCOPY WITH MYOSURE;  Surgeon: Arvella Nigh, MD;  Location: [place];  Service: Gynecology;  Laterality: N/A;   ESOPHAGOGASTRODUODENOSCOPY (EGD) WITH  PROPOFOL N/A 09/16/2018   Procedure: ESOPHAGOGASTRODUODENOSCOPY (EGD) WITH PROPOFOL;  Surgeon: Ronald Lobo, MD;  Location: [place];  Service: Endoscopy;  Laterality: N/A;   GIVENS CAPSULE STUDY N/A 09/16/2018   Procedure: GIVENS CAPSULE STUDY;  Surgeon: Ronald Lobo, MD;  Location: [place];  Service: Endoscopy;  Laterality: N/A;   HAMMER TOE SURGERY Left 2010   left second toe   IR RADIOLOGY PERIPHERAL GUIDED IV START  10/27/2019   IR US GUIDE VASC ACCESS RIGHT  10/27/2019   KNEE ARTHROSCOPY Right 2010   KNEE SURGERY Left 1993   LYMPH NODE BIOPSY N/A 03/01/2017   Procedure:  SENTINEL LYMPH NODE BIOPSY;  Surgeon: Everitt Amber, MD;  Location: [facility];  Service: Gynecology;  Laterality: N/A;   NASAL SINUS SURGERY  1985 and 1995   ROBOTIC ASSISTED TOTAL HYSTERECTOMY WITH BILATERAL SALPINGO OOPHERECTOMY Bilateral 03/01/2017   Procedure: XI ROBOTIC ASSISTED TOTAL HYSTERECTOMY WITH BILATERAL SALPINGO OOPHORECTOMY;  Surgeon: Everitt Amber, MD;  Location: [facility];  Service: Gynecology;  Laterality: Bilateral;   ROTATOR CUFF REPAIR Right 04/2004   TOE SURGERY Right 2012   Great toe and second toe   TOTAL ELBOW REPLACEMENT Right 2000   TOTAL KNEE ARTHROPLASTY Right 01/12/2010   TOTAL KNEE ARTHROPLASTY Left 03/02/2020   Procedure: TOTAL KNEE ARTHROPLASTY;  Surgeon: Renette Butters, MD;  Location: [facility];  Service: Orthopedics;  Laterality: Left;   TRANSTHORACIC ECHOCARDIOGRAM  03/28/2016   ef 60-65%/ mild AR and MR   TUBAL LIGATION Bilateral 1978   ULNAR NERVE TRANSPOSITION Right 04/21/2021   Procedure: RIGHT ULNAR NERVE DECOMPRESSION ELBOW;  Surgeon: Leanora Cover, MD;  Location: [place];  Service: Orthopedics;  Laterality: Right;  axillary block    Current Medications: Current Meds  Medication Sig   atorvastatin (LIPITOR) 40 MG tablet Take 40 mg by mouth every morning.    betamethasone valerate (VALISONE) 0.1 % cream 2 (two) times a week.    Cholecalciferol (VITAMIN D3) 5000 units TABS Take 5,000 Units by mouth daily.    Cranberry-Vitamin C (CRANBERRY CONCENTRATE/VITAMINC PO) Take 1 tablet by mouth daily.    docusate sodium (COLACE) 100 MG capsule Take 100 mg by mouth daily.   hydrochlorothiazide (HYDRODIURIL) 12.5 MG tablet Take 12.5 mg by mouth daily.    ipratropium (ATROVENT) 0.03 % nasal spray Place 2 sprays into both nostrils every 12 (twelve) hours as needed for rhinitis.   JANUVIA 100 MG tablet Take 100 mg by mouth daily after supper.    Lancets (ONETOUCH DELICA PLUS [id number]) MISC    metoprolol succinate (TOPROL-XL) 50 MG 24 hr tablet take 1  tablet by mouth in the morning and at bedtime with or immediately following a meal   Multiple Vitamin (MULTIVITAMIN) tablet Take 1 tablet by mouth daily.   olmesartan (BENICAR) 20 MG tablet Take 20 mg by mouth daily.   ONE TOUCH ULTRA TEST test strip    Polyethyl Glycol-Propyl Glycol (SYSTANE OP) Place 1 drop into both eyes daily as needed (dry eyes).   rivaroxaban (XARELTO) 20 MG TABS tablet TAKE ONE TABLET BY MOUTH ONE TIME DAILY  with supper   saline (AYR) GEL Place 1 application into the nose at bedtime.   triamcinolone cream (KENALOG) 0.1 % as needed for itching.   Wheat Dextrin (BENEFIBER PO) Take by mouth.     Allergies:   Augmentin [amoxicillin-pot clavulanate] and Cephalosporins   Social History   Socioeconomic History   Marital status: Married    Spouse name: Not on file  Number of children: 2   Years of education: 12   Highest education level: Not on file  Occupational History   Not on file  Tobacco Use   Smoking status: Never   Smokeless tobacco: Never  Vaping Use   Vaping Use: Never used  Substance and Sexual Activity   Alcohol use: No   Drug use: No   Sexual activity: Not on file  Other Topics Concern   Not on file  Social History Narrative   Married w/2 children (boy and girl).  Retired.   Social Determinants of Health   Financial Resource Strain: Not on file  Food Insecurity: Not on file  Transportation Needs: Not on file  Physical Activity: Not on file  Stress: Not on file  Social Connections: Not on file     Family History: The patient's family history includes Heart failure in her father and mother.  ROS:   Please see the history of present illness.    All other systems reviewed and are negative.  EKGs/Labs/Other Studies Reviewed:    Atrial Fibrillation Ablation 10/29/2019: CONCLUSIONS: 1. Sinus rhythm upon presentation.   2. Successful electrical isolation and anatomical encircling of all four pulmonary veins with radiofrequency current.     3. Cavo-tricuspid isthmus ablation was performed with complete bidirectional isthmus block achieved.  4. No inducible arrhythmias following ablation both on and off of dobutamine 5. No early apparent complications.  Atrial Fibrillation Ablation 05/29/2019: CONCLUSIONS: 1. Sinus rhythm upon presentation.   2. Successful electrical isolation and anatomical encircling of all four pulmonary veins with radiofrequency current. 3. No inducible arrhythmias following ablation both on and off of dobutamine 4. No early apparent complications.  ETT 06/28/2018: Blood pressure demonstrated a normal response to exercise. There was no ST segment deviation noted during stress.  Negative ETT but patient only achieved 78% of PMHR at 118 bpm Normal hemodynamic response Rare PVCls with stress    Echo 03/28/2016: - Left ventricle: The cavity size was normal. Systolic function was    normal. The estimated ejection fraction was in the range of 60%    to 65%. Wall motion was normal; there were no regional wall    motion abnormalities. The transmitral flow pattern was normal.    Left ventricular diastolic function parameters were normal.  - Aortic valve: Moderate focal calcification, consistent with    sclerosis. There was mild regurgitation.  - Mitral valve: There was mild regurgitation.   EKG:   EKG is personally reviewed and interpreted. 08/26/2021: EKG was not ordered today. 12/23/2020: EKG was not ordered.  Recent Labs: 01/31/2021: Hemoglobin 14.1; Platelets 162 04/18/2021: BUN 10; Creatinine, Ser 0.63; Potassium 4.7; Sodium 135   Recent Lipid Panel No results found for: CHOL, TRIG, HDL, CHOLHDL, VLDL, LDLCALC, LDLDIRECT   Risk Assessment/Calculations:      Physical Exam:    VS:  BP 140/70 (BP Location: Left Arm, Patient Position: Sitting, Cuff Size: Normal)   Pulse 74   Ht 5\' 3"  (1.6 m)   Wt 211 lb (95.7 kg)   SpO2 98%   BMI 37.38 kg/m     Wt Readings from Last 3 Encounters:  08/26/21 211  lb (95.7 kg)  04/21/21 211 lb 13.8 oz (96.1 kg)  01/31/21 212 lb 9.6 oz (96.4 kg)     GEN:  Well nourished, well developed in no acute distress HEENT: Normal NECK: No JVD; No carotid bruits LYMPHATICS: No lymphadenopathy CARDIAC: RRR, no murmurs, rubs, gallops RESPIRATORY:  Clear to auscultation without rales,  wheezing or rhonchi  ABDOMEN: Soft, non-tender, non-distended MUSCULOSKELETAL:  No edema; No deformity  SKIN: Warm and dry NEUROLOGIC:  Alert and oriented x 3 PSYCHIATRIC:  Normal affect   ASSESSMENT:    1. Paroxysmal atrial fibrillation (HCC)   2. Secondary hypercoagulable state ([place])   3. Essential hypertension     PLAN:    In order of problems listed above:  Paroxysmal atrial fibrillation (HCC) 2 separate atrial fibrillation ablations performed, repeat on 10/29/2019, Dr. Curt Bears.  Seems to be remaining in sinus rhythm for most of the time.  Her Fitbit sometimes shows rapid increase in heart rate that is nonsustained.  I wonder if this could be artifact.  Overall she does not feel any more atrial fibrillation.  Continue with current medications.  Secondary hypercoagulable state ([place]) Currently on Xarelto.  No bleeding.  Hemoglobin reviewed.  No changes.  Essential hypertension Well-controlled no changes made.  Olmesartan 20 mg a day.  Paroxysmal atrial fibrillation flutter -Repeat ablation 10/29/2019.  Remaining in sinus rhythm.  Flecainide was stopped in April 2021.  Continue with Toprol in the evening.  Dr. Curt Bears.  Doing well.  Obstructive sleep apnea -CPAP compliance encouraged.  Essential hypertension -Well-controlled  Prior GI bleed --3 years ago, stable.   Follow-up:   6 months with APP. 1 year with Dr. Marlou Porch.  Medication Adjustments/Labs and Tests Ordered: Current medicines are reviewed at length with the patient today.  Concerns regarding medicines are outlined above.   No orders of the defined types were placed in this encounter.  No orders of  the defined types were placed in this encounter.  Patient Instructions  Medication Instructions:  The current medical regimen is effective;  continue present plan and medications.  *If you need a refill on your cardiac medications before your next appointment, please call your pharmacy*  Follow-Up: At [facility], you and your health needs are our priority.  As part of our continuing mission to provide you with exceptional heart care, we have created designated Provider Care Teams.  These Care Teams include your primary Cardiologist (physician) and Advanced Practice Providers (APPs -  Physician Assistants and Nurse Practitioners) who all work together to provide you with the care you need, when you need it.  We recommend signing up for the patient portal called "MyChart".  Sign up information is provided on this After Visit Summary.  MyChart is used to connect with patients for Virtual Visits (Telemedicine).  Patients are able to view lab/test results, encounter notes, upcoming appointments, etc.  Non-urgent messages can be sent to your provider as well.   To learn more about what you can do with MyChart, go to [email or web address].    Your next appointment:   6 month(s)  The format for your next appointment:   In Person  Provider:   NP or PA  an   Thank you for choosing [facility]!!     I,Mathew Stumpf,acting as a scribe for [name], MD.,have documented all relevant documentation on the behalf of [name], MD,as directed by  [name], MD while in the presence of [name], MD.  I, [name], MD, have reviewed all documentation for this visit. The documentation on 08/26/21 for the exam, diagnosis, procedures, and orders are all accurate and complete.   Signed, [name], MD  08/26/2021 3:21 PM    [facility]

## 2021-08-26 NOTE — Assessment & Plan Note (Signed)
Currently on Xarelto.  No bleeding.  Hemoglobin reviewed.  No changes.

## 2021-08-26 NOTE — Assessment & Plan Note (Signed)
Well-controlled no changes made.  Olmesartan 20 mg a day.

## 2021-08-30 DIAGNOSIS — H04123 Dry eye syndrome of bilateral lacrimal glands: Secondary | ICD-10-CM | POA: Diagnosis not present

## 2021-08-30 DIAGNOSIS — E119 Type 2 diabetes mellitus without complications: Secondary | ICD-10-CM | POA: Diagnosis not present

## 2021-08-30 DIAGNOSIS — H25813 Combined forms of age-related cataract, bilateral: Secondary | ICD-10-CM | POA: Diagnosis not present

## 2021-08-30 DIAGNOSIS — H43812 Vitreous degeneration, left eye: Secondary | ICD-10-CM | POA: Diagnosis not present

## 2021-09-01 ENCOUNTER — Ambulatory Visit: Payer: Self-pay | Admitting: Neurology

## 2021-09-02 DIAGNOSIS — Z7901 Long term (current) use of anticoagulants: Secondary | ICD-10-CM | POA: Diagnosis not present

## 2021-09-02 DIAGNOSIS — I480 Paroxysmal atrial fibrillation: Secondary | ICD-10-CM | POA: Diagnosis not present

## 2021-09-02 DIAGNOSIS — G4733 Obstructive sleep apnea (adult) (pediatric): Secondary | ICD-10-CM | POA: Diagnosis not present

## 2021-09-02 DIAGNOSIS — M5416 Radiculopathy, lumbar region: Secondary | ICD-10-CM | POA: Diagnosis not present

## 2021-09-02 DIAGNOSIS — E785 Hyperlipidemia, unspecified: Secondary | ICD-10-CM | POA: Diagnosis not present

## 2021-09-02 DIAGNOSIS — Z23 Encounter for immunization: Secondary | ICD-10-CM | POA: Diagnosis not present

## 2021-09-02 DIAGNOSIS — Z1339 Encounter for screening examination for other mental health and behavioral disorders: Secondary | ICD-10-CM | POA: Diagnosis not present

## 2021-09-02 DIAGNOSIS — Z0000 Encounter for general adult medical examination without abnormal findings: Secondary | ICD-10-CM | POA: Diagnosis not present

## 2021-09-02 DIAGNOSIS — I739 Peripheral vascular disease, unspecified: Secondary | ICD-10-CM | POA: Diagnosis not present

## 2021-09-02 DIAGNOSIS — E1151 Type 2 diabetes mellitus with diabetic peripheral angiopathy without gangrene: Secondary | ICD-10-CM | POA: Diagnosis not present

## 2021-09-02 DIAGNOSIS — I10 Essential (primary) hypertension: Secondary | ICD-10-CM | POA: Diagnosis not present

## 2021-09-02 DIAGNOSIS — R82998 Other abnormal findings in urine: Secondary | ICD-10-CM | POA: Diagnosis not present

## 2021-09-02 DIAGNOSIS — Z1331 Encounter for screening for depression: Secondary | ICD-10-CM | POA: Diagnosis not present

## 2021-09-02 DIAGNOSIS — Z1231 Encounter for screening mammogram for malignant neoplasm of breast: Secondary | ICD-10-CM | POA: Diagnosis not present

## 2021-09-21 ENCOUNTER — Ambulatory Visit: Payer: Medicare HMO | Admitting: Neurology

## 2021-09-27 DIAGNOSIS — H52223 Regular astigmatism, bilateral: Secondary | ICD-10-CM | POA: Diagnosis not present

## 2021-10-27 DIAGNOSIS — Z0100 Encounter for examination of eyes and vision without abnormal findings: Secondary | ICD-10-CM | POA: Diagnosis not present

## 2021-12-08 IMAGING — DX DG KNEE 1-2V PORT*L*
2 series · 2 of 2 positions shown · non-contrast
Comparison: None.

CLINICAL DATA: Left knee replacement

EXAM:
PORTABLE LEFT KNEE - 1-2 VIEW

[knee ap]
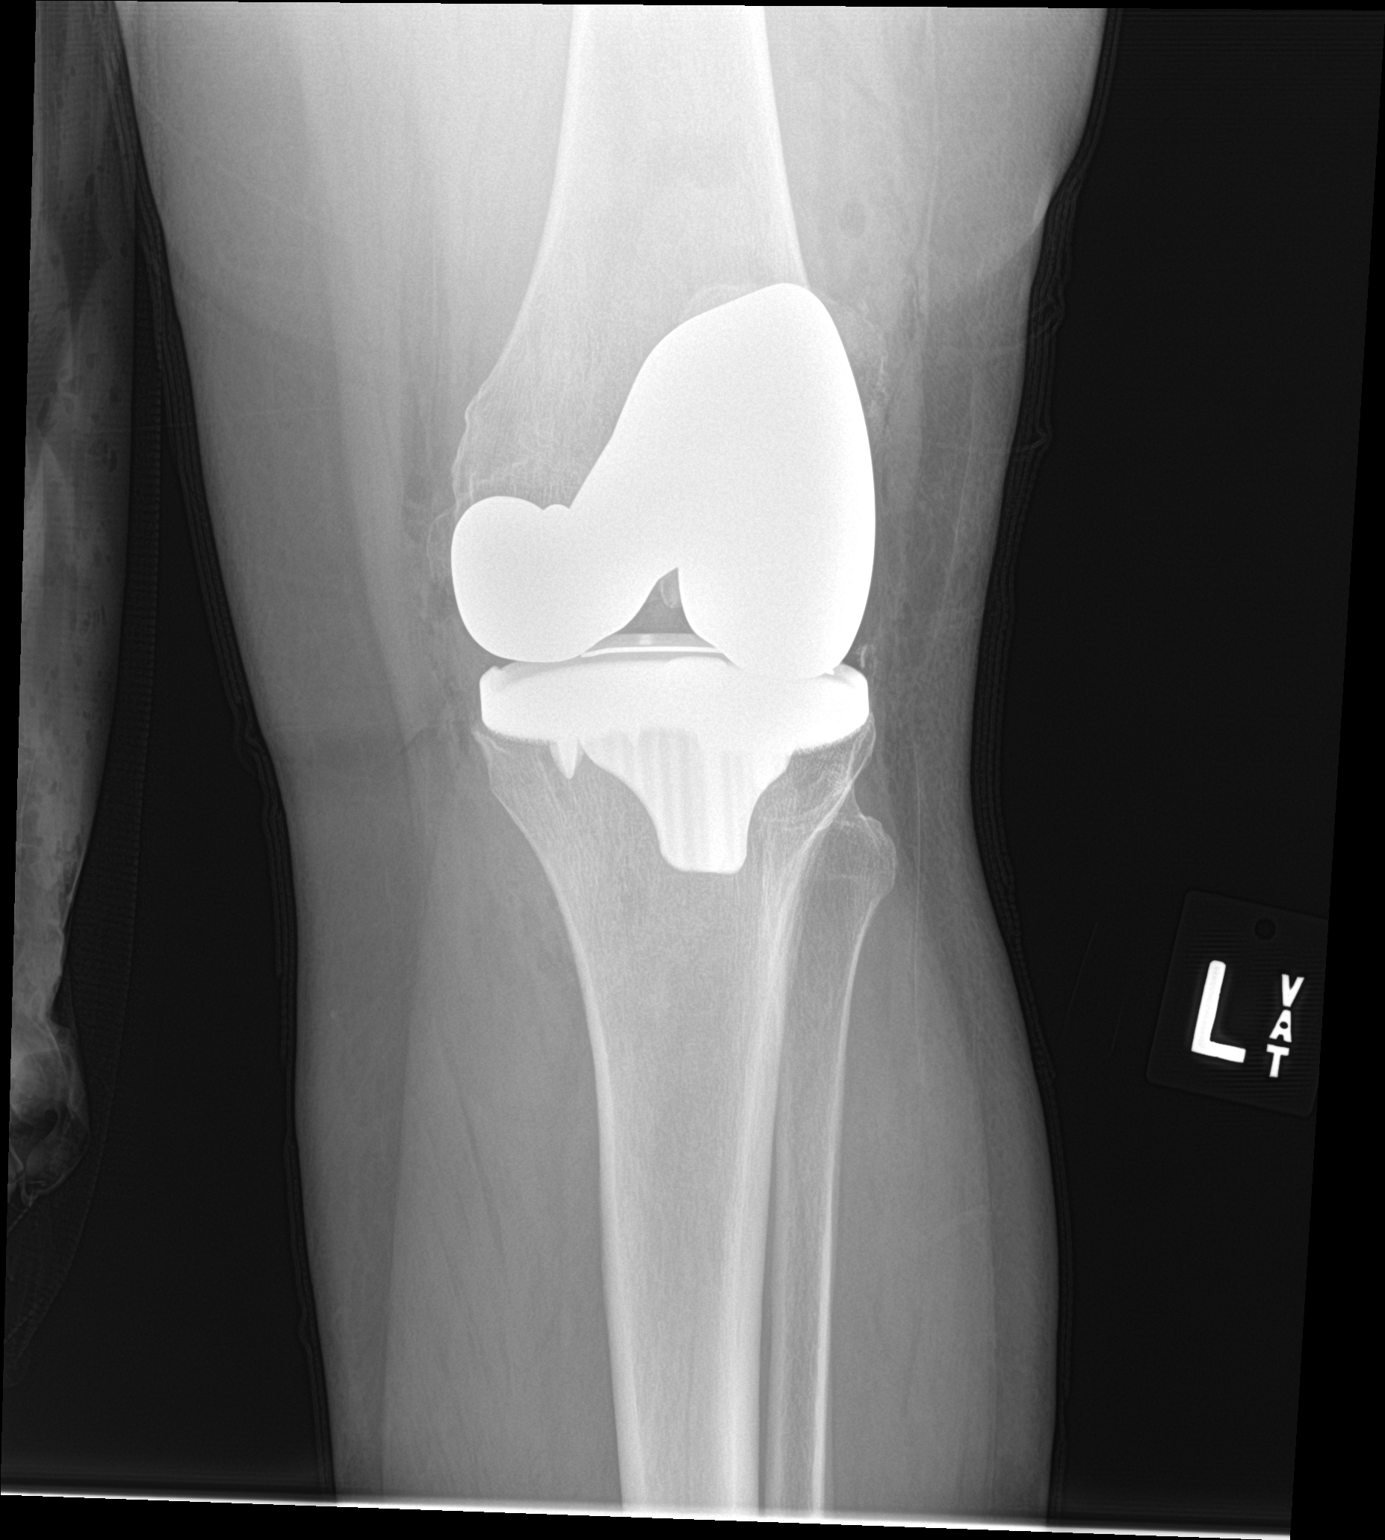

[knee lat]
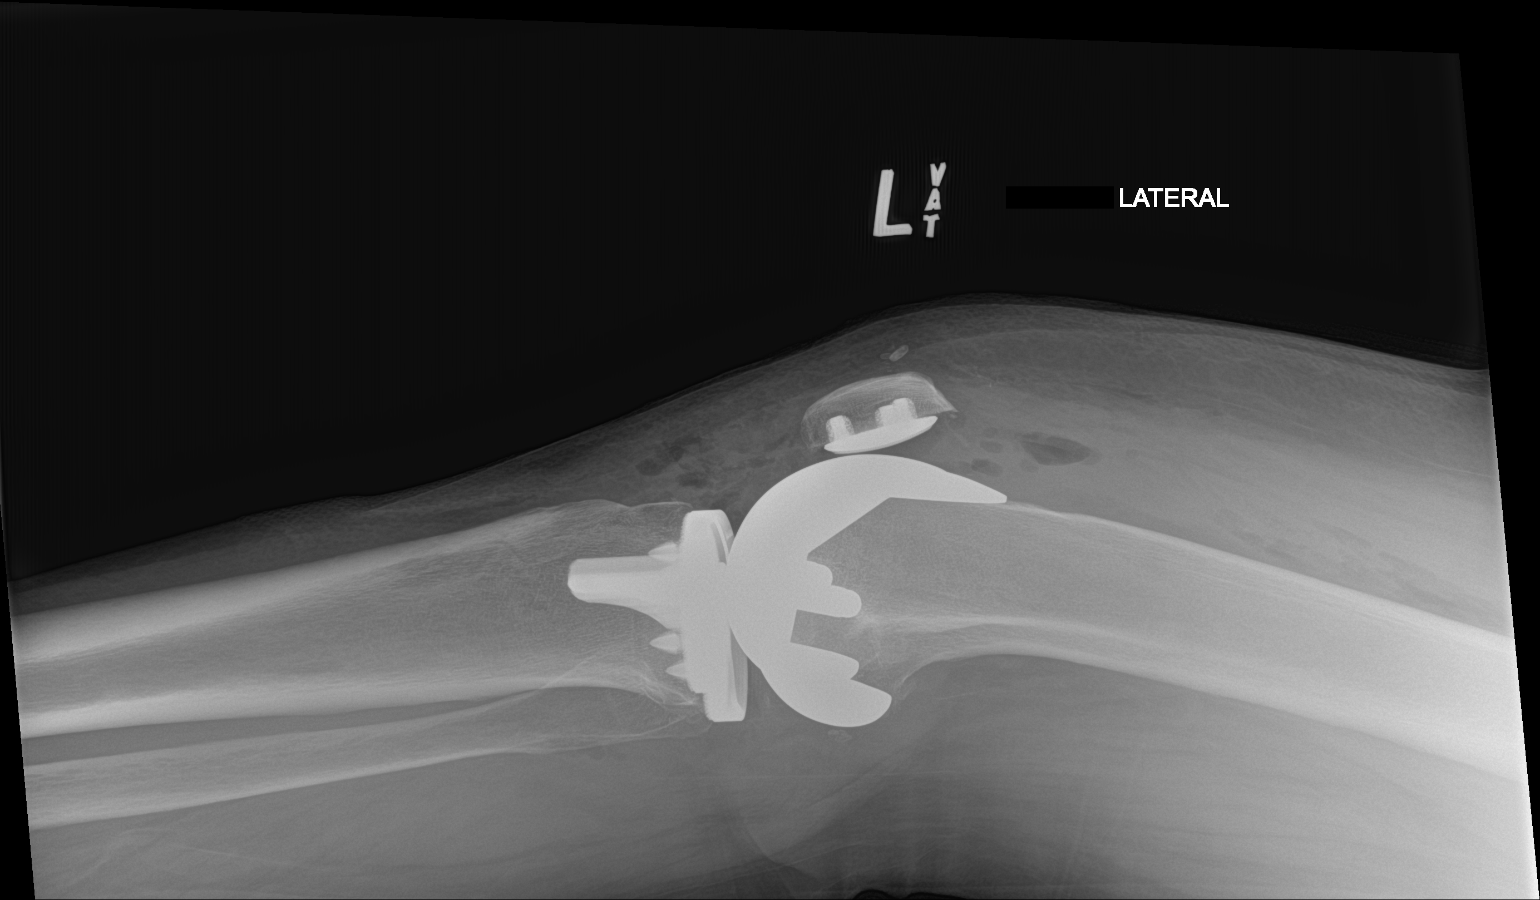

[2 of 2 positions shown; findings below may reference images not displayed]

FINDINGS: Changes of left knee replacement. No hardware bony complicating
feature. Soft tissue and joint space gas present.
IMPRESSION: Left knee replacement.  No visible complicating feature.

## 2021-12-22 ENCOUNTER — Encounter: Payer: Self-pay | Admitting: *Deleted

## 2021-12-26 ENCOUNTER — Other Ambulatory Visit: Payer: Self-pay

## 2021-12-26 ENCOUNTER — Ambulatory Visit: Payer: Self-pay | Admitting: Neurology

## 2021-12-26 ENCOUNTER — Ambulatory Visit (INDEPENDENT_AMBULATORY_CARE_PROVIDER_SITE_OTHER): Payer: Medicare HMO | Admitting: Neurology

## 2021-12-26 ENCOUNTER — Encounter: Payer: Self-pay | Admitting: Neurology

## 2021-12-26 NOTE — Progress Notes (Signed)
Error

## 2021-12-30 DIAGNOSIS — G4733 Obstructive sleep apnea (adult) (pediatric): Secondary | ICD-10-CM | POA: Diagnosis not present

## 2022-01-02 ENCOUNTER — Ambulatory Visit: Payer: Medicare HMO | Admitting: Cardiology

## 2022-01-12 DIAGNOSIS — G4733 Obstructive sleep apnea (adult) (pediatric): Secondary | ICD-10-CM | POA: Diagnosis not present

## 2022-01-23 DIAGNOSIS — M25512 Pain in left shoulder: Secondary | ICD-10-CM | POA: Diagnosis not present

## 2022-01-30 DIAGNOSIS — G4733 Obstructive sleep apnea (adult) (pediatric): Secondary | ICD-10-CM | POA: Diagnosis not present

## 2022-01-31 ENCOUNTER — Ambulatory Visit: Payer: Medicare HMO | Admitting: Cardiology

## 2022-01-31 ENCOUNTER — Encounter: Payer: Self-pay | Admitting: Cardiology

## 2022-01-31 VITALS — BP 154/72 | HR 59 | Ht 62.5 in | Wt 213.0 lb

## 2022-01-31 DIAGNOSIS — G4733 Obstructive sleep apnea (adult) (pediatric): Secondary | ICD-10-CM

## 2022-01-31 DIAGNOSIS — I480 Paroxysmal atrial fibrillation: Secondary | ICD-10-CM

## 2022-01-31 DIAGNOSIS — I483 Typical atrial flutter: Secondary | ICD-10-CM | POA: Diagnosis not present

## 2022-01-31 DIAGNOSIS — E785 Hyperlipidemia, unspecified: Secondary | ICD-10-CM

## 2022-01-31 NOTE — Progress Notes (Signed)
? ?Electrophysiology Office Note ? ? ?Date:  01/31/2022  ? ?ID:  Erika Kim, DOB [birth year], MRN [id number] ? ?PCP:  [name], MD  ?Cardiologist:  Marlou Porch ?Primary Electrophysiologist:  Yaffa Seckman Meredith Leeds, MD   ? ?No chief complaint on file. ? ?  ?History of Present Illness: ?Erika Kim is a 76 y.o. female who is being seen today for the evaluation of atrial fibrillation at the request of [name]. Presenting today for electrophysiology evaluation. ? ?She has a history significant for atrial fibrillation, hypertension, hyperlipidemia, diabetes, OSA on CPAP.  She is status post ablation 05/29/2019.  She returned with atrial flutter and required cardioversion.  She is now status post repeat ablation 10/29/2019. ? ?Today, denies symptoms of palpitations, chest pain, shortness of breath, orthopnea, PND, lower extremity edema, claudication, dizziness, presyncope, syncope, bleeding, or neurologic sequela. The patient is tolerating medications without difficulties.  Since being seen she has done well.  She continues to have short episodes of palpitations but overall feels her atrial fibrillation is under good control.  She feels well today and is without complaint. ? ? ?Past Medical History:  ?Diagnosis Date  ? Acoustic neuroma (HCC) followed by dr brown at [facility]  ? right side w/ chronic roaring noise  ? Anemia   ? hx of 2019 realted to Xarelto   ? Anticoagulant long-term use   ? xarelto  ? Benign paroxysmal positional vertigo   ? Dysrhythmia   ? afib- 2 ablations followed by Dr Marlou Porch - lov - 4/21   ? Endometrial polyp   ? Family history of adverse reaction to anesthesia   ? daughter- nausea   ? Frequency of urination   ? Hx of transfusion of packed red blood cells   ? related to small intestine in 2019   ? Hyperlipidemia   ? Hypertension   ? OA (osteoarthritis)   ? left knee  ? OSA on CPAP   ? cpap - setting at ? 8   ? PAF (paroxysmal atrial fibrillation) [facility]) cardiologist-  dr Marlou Porch  ? dx 04/ 2017   ? PMB (postmenopausal bleeding)   ? Pneumonia   ? walking pneumonia -age 7   ? TMJ (temporomandibular joint disorder)   ? left side-- wears guard  ? Type 2 diabetes mellitus ([place])   ? Wears contact lenses   ? ?Past Surgical History:  ?Procedure Laterality Date  ? ABDOMINAL HYSTERECTOMY    ? ATRIAL FIBRILLATION ABLATION  05/29/2019  ? ATRIAL FIBRILLATION ABLATION N/A 05/29/2019  ? Procedure: ATRIAL FIBRILLATION ABLATION;  Surgeon: [name], MD;  Location: [place] [facility];  Service: Cardiovascular;  Laterality: N/A;  ? ATRIAL FIBRILLATION ABLATION N/A 10/29/2019  ? Procedure: ATRIAL FIBRILLATION ABLATION;  Surgeon: [name], MD;  Location: [place] [facility];  Service: Cardiovascular;  Laterality: N/A;  ? CARDIOVERSION N/A 06/24/2019  ? Procedure: CARDIOVERSION;  Surgeon: Skeet Latch, MD;  Location: [place];  Service: Cardiovascular;  Laterality: N/A;  ? CARPAL TUNNEL RELEASE Right 04/17/2007  ? w/ Excision ganglion cyst and Pulley Release right thumb  ? DILATATION & CURETTAGE/HYSTEROSCOPY WITH MYOSURE N/A 01/12/2017  ? Procedure: DILATATION & CURETTAGE/HYSTEROSCOPY WITH MYOSURE;  Surgeon: Arvella Nigh, MD;  Location: [facility];  Service: Gynecology;  Laterality: N/A;  ? ESOPHAGOGASTRODUODENOSCOPY (EGD) WITH PROPOFOL N/A 09/16/2018  ? Procedure: ESOPHAGOGASTRODUODENOSCOPY (EGD) WITH PROPOFOL;  Surgeon: Ronald Lobo, MD;  Location: [place];  Service: Endoscopy;  Laterality: N/A;  ? GIVENS CAPSULE STUDY N/A 09/16/2018  ?  Procedure: GIVENS CAPSULE STUDY;  Surgeon: Ronald Lobo, MD;  Location: [facility];  Service: Endoscopy;  Laterality: N/A;  ? HAMMER TOE SURGERY Left 2010  ? left second toe  ? IR RADIOLOGY PERIPHERAL GUIDED IV START  10/27/2019  ? IR US GUIDE VASC ACCESS RIGHT  10/27/2019  ? KNEE ARTHROSCOPY Right 2010  ? KNEE SURGERY Left 1993  ? LYMPH NODE BIOPSY N/A 03/01/2017  ? Procedure: SENTINEL LYMPH NODE BIOPSY;  Surgeon: Everitt Amber, MD;  Location: [facility]  [facility];  Service: Gynecology;  Laterality: N/A;  ? [place]  ? ROBOTIC ASSISTED TOTAL HYSTERECTOMY WITH BILATERAL SALPINGO OOPHERECTOMY Bilateral 03/01/2017  ? Procedure: XI ROBOTIC ASSISTED TOTAL HYSTERECTOMY WITH BILATERAL SALPINGO OOPHORECTOMY;  Surgeon: Everitt Amber, MD;  Location: [facility];  Service: Gynecology;  Laterality: Bilateral;  ? ROTATOR CUFF REPAIR Right 04/2004  ? TOE SURGERY Right 2012  ? Great toe and second toe  ? TOTAL ELBOW REPLACEMENT Right 2000  ? TOTAL KNEE ARTHROPLASTY Right 01/12/2010  ? TOTAL KNEE ARTHROPLASTY Left 03/02/2020  ? Procedure: TOTAL KNEE ARTHROPLASTY;  Surgeon: Renette Butters, MD;  Location: [facility];  Service: Orthopedics;  Laterality: Left;  ? TRANSTHORACIC ECHOCARDIOGRAM  03/28/2016  ? ef 60-65%/ mild AR and MR  ? TUBAL LIGATION Bilateral 1978  ? ULNAR NERVE TRANSPOSITION Right 04/21/2021  ? Procedure: RIGHT ULNAR NERVE DECOMPRESSION ELBOW;  Surgeon: Leanora Cover, MD;  Location: [place];  Service: Orthopedics;  Laterality: Right;  axillary block  ? ? ? ?Current Outpatient Medications  ?Medication Sig Dispense Refill  ? atorvastatin (LIPITOR) 40 MG tablet Take 40 mg by mouth every morning.     ? betamethasone valerate (VALISONE) 0.1 % cream 2 (two) times a week.     ? Cholecalciferol (VITAMIN D3) 5000 units TABS Take 5,000 Units by mouth daily.     ? Cranberry-Vitamin C (CRANBERRY CONCENTRATE/VITAMINC PO) Take 1 tablet by mouth daily.     ? docusate sodium (COLACE) 100 MG capsule Take 100 mg by mouth daily.    ? hydrochlorothiazide (HYDRODIURIL) 12.5 MG tablet Take 12.5 mg by mouth daily.     ? ipratropium (ATROVENT) 0.03 % nasal spray Place 2 sprays into both nostrils every 12 (twelve) hours as needed for rhinitis.    ? JANUVIA 100 MG tablet Take 100 mg by mouth daily after supper.     ? Lancets (ONETOUCH DELICA PLUS [id number]) MISC     ? metoprolol succinate (TOPROL-XL) 50 MG 24 hr tablet take 1 tablet by mouth in the morning and at  bedtime with or immediately following a meal 180 tablet 2  ? Multiple Vitamin (MULTIVITAMIN) tablet Take 1 tablet by mouth daily.    ? olmesartan (BENICAR) 20 MG tablet Take 20 mg by mouth daily.    ? ONE TOUCH ULTRA TEST test strip     ? Polyethyl Glycol-Propyl Glycol (SYSTANE OP) Place 1 drop into both eyes daily as needed (dry eyes).    ? rivaroxaban (XARELTO) 20 MG TABS tablet TAKE ONE TABLET BY MOUTH ONE TIME DAILY  with supper 90 tablet 1  ? saline (AYR) GEL Place 1 application into the nose at bedtime.    ? triamcinolone cream (KENALOG) 0.1 % as needed for itching.    ? Wheat Dextrin (BENEFIBER PO) Take by mouth.    ? ?No current facility-administered medications for this visit.  ? ? ?Allergies:   Augmentin [amoxicillin-pot clavulanate] and Cephalosporins  ? ?Social History:  The patient  reports that she has never smoked. She has never used smokeless tobacco. She reports that she does not drink alcohol and does not use drugs.  ? ?Family History:  The patient's family history includes Heart failure in her father and mother; Sleep apnea in her brother.  ? ?ROS:  Please see the history of present illness.   Otherwise, review of systems is positive for none.   All other systems are reviewed and negative.  ? ?PHYSICAL EXAM: ?VS:  BP (!) 154/72   Pulse (!) 59   Ht 5' 2.5" (1.588 m)   Wt 213 lb (96.6 kg)   BMI 38.34 kg/m?  , BMI Body mass index is 38.34 kg/m?. ?GEN: Well nourished, well developed, in no acute distress  ?HEENT: normal  ?Neck: no JVD, carotid bruits, or masses ?Cardiac: RRR; no murmurs, rubs, or gallops,no edema  ?Respiratory:  clear to auscultation bilaterally, normal work of breathing ?GI: soft, nontender, nondistended, + BS ?MS: no deformity or atrophy  ?Skin: warm and dry ?Neuro:  Strength and sensation are intact ?Psych: euthymic mood, full affect ? ?EKG:  EKG is ordered today. ?Personal review of the ekg ordered shows sinus rhythm, rate 59 ? ?Recent Labs: ?04/18/2021: BUN 10; Creatinine, Ser  0.63; Potassium 4.7; Sodium 135  ? ? ?Lipid Panel  ?No results found for: CHOL, TRIG, HDL, CHOLHDL, VLDL, LDLCALC, LDLDIRECT ? ? ?Wt Readings from Last 3 Encounters:  ?01/31/22 213 lb (96.6 kg)  ?12/26/21 214

## 2022-01-31 NOTE — Patient Instructions (Signed)
Medication Instructions:  Your physician recommends that you continue on your current medications as directed. Please refer to the Current Medication list given to you today. *If you need a refill on your cardiac medications before your next appointment, please call your pharmacy*  Lab Work: None. If you have labs (blood work) drawn today and your tests are completely normal, you will receive your results only by: MyChart Message (if you have MyChart) OR A paper copy in the mail If you have any lab test that is abnormal or we need to change your treatment, we will call you to review the results.  Testing/Procedures: None.  Follow-Up: At [facility], you and your health needs are our priority.  As part of our continuing mission to provide you with exceptional heart care, we have created designated Provider Care Teams.  These Care Teams include your primary Cardiologist (physician) and Advanced Practice Providers (APPs -  Physician Assistants and Nurse Practitioners) who all work together to provide you with the care you need, when you need it.  Your physician wants you to follow-up in: 6 months with one of the following Advanced Practice Providers on your designated Care Team:    Renee Ursuy, PA-C Michael "Andy" Tillery, PA-C   You will receive a reminder letter in the mail two months in advance. If you don't receive a letter, please call our office to schedule the follow-up appointment.  We recommend signing up for the patient portal called "MyChart".  Sign up information is provided on this After Visit Summary.  MyChart is used to connect with patients for Virtual Visits (Telemedicine).  Patients are able to view lab/test results, encounter notes, upcoming appointments, etc.  Non-urgent messages can be sent to your provider as well.   To learn more about what you can do with MyChart, go to [email or web address].    Any Other Special Instructions Will Be Listed Below (If  Applicable).         

## 2022-02-07 ENCOUNTER — Encounter: Payer: Self-pay | Admitting: Podiatry

## 2022-02-07 ENCOUNTER — Ambulatory Visit: Payer: Medicare HMO | Admitting: Podiatry

## 2022-02-07 DIAGNOSIS — G5792 Unspecified mononeuropathy of left lower limb: Secondary | ICD-10-CM

## 2022-02-07 DIAGNOSIS — M722 Plantar fascial fibromatosis: Secondary | ICD-10-CM | POA: Diagnosis not present

## 2022-02-07 MED ORDER — TRIAMCINOLONE ACETONIDE 40 MG/ML IJ SUSP
40.0000 mg | Freq: Once | INTRAMUSCULAR | Status: AC
  Administered 2022-02-07: 40 mg

## 2022-02-07 NOTE — Progress Notes (Signed)
She presents today for follow-up of her neuritis capsulitis dorsal aspect of her left foot is well as Planter fasciitis left.  States that her both of her feet have been bothering her recently this seems to have noticed that the left seems to be bothering her more since she has had some trouble with her sciatica.  She states that she will be seeing Dr. Mardelle Matte in the next couple of weeks to discuss left shoulder surgery. ? ?Objective: Vital signs are stable she is alert and oriented x3 pulses are palpable.  Neurologic sensorium is intact she has tenderness along the deep peroneal nerve of the left foot lesser degree on the right foot.  Considerable osteophytic spurring dorsal aspect of the left over the right at the tarsometatarsal joints.  She also has pain on palpation MucoClear tubercle of the left heel. ? ?Assessment neuritis of the deep peroneal nerve with capsulitis dorsal aspect left foot.  She also has plantar fasciitis left foot. ? ?Plan: Discussed etiology pathology conservative versus surgical therapies.  At this point I injected dorsal aspect with 10 mg of Kenalog and 5 mg Marcaine.  I injected the plantar heel with 10 mg Kenalog 5 mg Marcaine.  She tolerated procedure well and I will follow-up with her in 6 months.  Call with questions or concerns. ? ? ?

## 2022-02-12 DIAGNOSIS — G4733 Obstructive sleep apnea (adult) (pediatric): Secondary | ICD-10-CM | POA: Diagnosis not present

## 2022-02-17 ENCOUNTER — Telehealth: Payer: Self-pay | Admitting: *Deleted

## 2022-02-17 ENCOUNTER — Other Ambulatory Visit: Payer: Self-pay | Admitting: Orthopedic Surgery

## 2022-02-17 DIAGNOSIS — M25512 Pain in left shoulder: Secondary | ICD-10-CM

## 2022-02-17 DIAGNOSIS — R931 Abnormal findings on diagnostic imaging of heart and coronary circulation: Secondary | ICD-10-CM

## 2022-02-17 NOTE — Telephone Encounter (Signed)
? ?  Pre-operative Risk Assessment  ?  ?Patient Name: Erika Kim  ?DOB: November 04, 1945 ?MRN: [id number]  ? ?  ? ?Request for Surgical Clearance   ? ?Procedure:   Left reverse total shoulder arthroplasty ? ?Date of Surgery:  Clearance TBD                              ?   ?Surgeon:  [name], MD ?Surgeon's Group or Practice Name:  [facility] ?Phone number:  [phone]  ?Fax number:  [phone] ?  ?Type of Clearance Requested:   ?- Pharmacy:  Hold Rivaroxaban (Xarelto)   ?  ?Type of Anesthesia:   Choice ?  ?Additional requests/questions:   ? ?Signed, ?Bevelyn Buckles L   ?02/17/2022, 5:10 PM   ?

## 2022-02-20 DIAGNOSIS — R931 Abnormal findings on diagnostic imaging of heart and coronary circulation: Secondary | ICD-10-CM | POA: Insufficient documentation

## 2022-02-20 NOTE — Telephone Encounter (Signed)
Will route to pharm then pt will need regular call (seen within last 1 month). Preop not specifically brought up by patient at that visit. ? ?

## 2022-02-20 NOTE — Telephone Encounter (Signed)
Patient with diagnosis of afib on Xarelto for anticoagulation.   ? ?Procedure: Left reverse total shoulder arthroplasty ?Date of procedure: TBD ? ?CHA2DS2-VASc Score = 5  ?This indicates a 7.2% annual risk of stroke. ?The patient's score is based upon: ?CHF History: 0 ?HTN History: 1 ?Diabetes History: 1 ?Stroke History: 0 ?Vascular Disease History: 1 ?Age Score: 1 ?Gender Score: 1 ? ?Elevated calcium score in 2020 - PMH updated. ? ?CrCl 65m/min using adjusted body weight due to obesity ?Platelet count 162K ? ?Per office protocol, patient can hold Xarelto for 3 days prior to procedure.   ?

## 2022-02-20 NOTE — Telephone Encounter (Signed)
? ?  Name: Erika Kim  ?DOB: [birth year]  ?MRN: [id number]  ? ?Primary Cardiologist: [name], MD ? ?Chart reviewed as part of pre-operative protocol coverage. Patient was contacted 02/20/2022 in reference to pre-operative risk assessment for left reverse total shoulder arthroplasty.  Erika Kim was last seen on 01/31/2022 by Dr. Curt Bears in follow-up for history of atrial fibrillation, HTN, HLD since that day, Erika Kim has done well since her last procedure and states that she is still active in the gym.  She is able to complete 4 METS of activity and her RCRI percent score is 0.4% ? ?  ?Per office protocol, patient can hold Xarelto for 3 days prior to procedure.   ? ?Therefore, based on ACC/AHA guidelines, the patient would be at acceptable risk for the planned procedure without further cardiovascular testing.  ? ?The patient was advised that if she develops new symptoms prior to surgery to contact our office to arrange for a follow-up visit, and she verbalized understanding. ? ?I will route this recommendation to the requesting party via Epic fax function and remove from pre-op pool. Please call with questions. ? ?Jimmy Picket, NP ?02/20/2022, 1:40 PM  ? ? ? ?

## 2022-03-01 DIAGNOSIS — G4733 Obstructive sleep apnea (adult) (pediatric): Secondary | ICD-10-CM | POA: Diagnosis not present

## 2022-03-02 ENCOUNTER — Ambulatory Visit: Payer: Medicare HMO | Admitting: Neurology

## 2022-03-02 VITALS — BP 139/59 | HR 56 | Ht 63.0 in | Wt 211.8 lb

## 2022-03-02 DIAGNOSIS — E1151 Type 2 diabetes mellitus with diabetic peripheral angiopathy without gangrene: Secondary | ICD-10-CM | POA: Diagnosis not present

## 2022-03-02 DIAGNOSIS — Z7901 Long term (current) use of anticoagulants: Secondary | ICD-10-CM | POA: Diagnosis not present

## 2022-03-02 DIAGNOSIS — G4733 Obstructive sleep apnea (adult) (pediatric): Secondary | ICD-10-CM | POA: Diagnosis not present

## 2022-03-02 DIAGNOSIS — M10079 Idiopathic gout, unspecified ankle and foot: Secondary | ICD-10-CM | POA: Diagnosis not present

## 2022-03-02 DIAGNOSIS — I739 Peripheral vascular disease, unspecified: Secondary | ICD-10-CM | POA: Diagnosis not present

## 2022-03-02 DIAGNOSIS — E785 Hyperlipidemia, unspecified: Secondary | ICD-10-CM | POA: Diagnosis not present

## 2022-03-02 DIAGNOSIS — I10 Essential (primary) hypertension: Secondary | ICD-10-CM | POA: Diagnosis not present

## 2022-03-02 DIAGNOSIS — Z9989 Dependence on other enabling machines and devices: Secondary | ICD-10-CM

## 2022-03-02 DIAGNOSIS — D6869 Other thrombophilia: Secondary | ICD-10-CM | POA: Diagnosis not present

## 2022-03-02 DIAGNOSIS — I480 Paroxysmal atrial fibrillation: Secondary | ICD-10-CM | POA: Diagnosis not present

## 2022-03-02 NOTE — Progress Notes (Signed)
Subjective:  ?  ?Patient ID: Erika Kim is a 76 y.o. female. ? ?HPI ? ? ? ?Interim history:  ? ?Erika Kim is a very pleasant 76 year old right-handed woman with an underlying medical history of hyperlipidemia, acoustic neuroma, hypertension, DM, recurrent headaches, positional vertigo, left shoulder pain, and sinus congestion, status post sinus surgery, left knee surgery, tubal ligation, right rotator cuff surgery, right total knee replacement surgery, right great toe surgery, status post hysterectomy in 2018, paroxysmal A. fib with status post cardioversion in September 2020, who presents for follow-up consultation of her obstructive sleep apnea, on CPAP therapy.  The patient is unaccompanied today. I last saw her on 09/01/2020, at which time she was compliant with her CPAP and doing well.   ? ?She requested a new machine in the interim which I prescribed. Her set up date was 12/30/2021 through [place].  She has a ResMed air sense 11 AutoSet machine. ? ?Today, 03/02/2022: Reviewed her CPAP compliance data from 12/30/2021 through 01/28/2022, which is a total of 30 days, during which time she used her machine every night with percent use days greater than 4 hours at 100%, indicating superb compliance with an average usage of 7 hours and 43 minutes, residual AHI at goal at 1.5/h, leak on the low side with the 95th percentile at 3.2 L/min on a pressure of 8 cm with EPR of 2.  She reports doing well with her new machine, no new issues.  She uses nasal pillows, sometimes she has nasal dryness and uses saline gel.  She tries to hydrate well.  She has more issues with her left shoulder and may need a shoulder replacement, she sees Dr. Mardelle Matte.  She had right shoulder arthroscopic surgery some 20 years ago.  She has a checkup today with her primary care for diabetes control. ?She feels well rested typically.  Sometimes she has trouble going to sleep.  She would rather not try any medication for fear of side effects or  interaction with her Xarelto.  She has not tried melatonin. ?She is s/p left total knee replacement on 03/02/2020. She had A. fib ablation on 10/29/2019. ?She had a right total knee replacement some 12 years ago. ?  ?The patient's allergies, current medications, family history, past medical history, past social history, past surgical history and problem list were reviewed and updated as appropriate.  ?   ?Previously: ? ?I saw her on 09/02/19, at which time she was compliant with her CPAP and doing well.  She had a cardiac ablation in August 2020 and cardioversion in September 2020. ?  ?I reviewed her CPAP compliance data from 07/31/2020 through 08/29/2020, which is a total of 30 days, during which time she used her machine every night with percent use days greater than 4 hours at 100%, indicating superb compliance with an average usage of 7 hours and 23 minutes, residual AHI at goal at 0.7/h, leak on the low side with a 95th percentile at 3.2 L/min on a pressure of 8 cm with EPR of 2.  Set up date was 02/25/2014. ?  ?  ?I saw her on 08/27/2017, at which time she was doing well with CPAP and fully compliant.  ?  ?She saw Vaughan Browner, in the interim, on 08/27/2018, at which time she was fully compliant with CPAP and advised to follow-up in 1 year.  ?  ?I reviewed her CPAP compliance data from 08/02/2019 through 08/31/2019 which is a total of 30 days, during which time she  used her machine every night with percent use days greater than 4 hours at 100%, indicating superb compliance with an average usage of 7 hours and 47 minutes, residual AHI at goal at 0.8/h, leak is low with a 95th percentile at 4.7 L/min on a pressure of 8 cm with EPR of 2.   ?  ?  ?I saw her on 08/22/2016, at which time she reported doing well. She was fully compliant with CPAP. She had no recent episode of headache or vertigo. She had a bout of A. fib some 6 months prior. She went to the ER and was diagnosed with paroxysmal A. fib, started on Xarelto and  had a follow-up with cardiology.  ?  ?I reviewed her CPAP compliance data from 07/28/2017 through 08/26/2017 which is a total of 30 days, during which time she used her machine every night with percent used days greater than 4 hours at 100%, indicating superb compliance with an average usage of 7 hours and 39 minutes, residual AHI at goal at 1.2 per hour, leak on the low side with the 95th percentile at 3.1 L/m on a pressure of 8 cm with EPR of 2.  ?  ?I saw her on 09/02/2015 for vertigo. She had seen Dr. Ernesto Rutherford on 07/20/2015 and he voiced concern that she may have vertiginous migraines. She reported 3 episodes of vertigo in 2016. She had some headaches which are infrequent, some associated with nausea but no significant photophobia and she did report that it would help to lie down and rest and she will take over-the-counter Advil. She felt that her headaches actually improved after she started CPAP therapy and she was fully compliant with CPAP. Her exam is nonfocal at the time and she followed regularly with ENT. I suggested she continue with as needed Aleve for headaches and talked to her ENT about potentially pursuing vestibular rehabilitation for vertigo. She was fully compliant with CPAP therapy. I suggested a one-year checkup. ?  ?I reviewed her CPAP compliance data from 07/22/2016 through 08/20/2016 which is a total of 30 days, during which time she used her machine every night with percent used days greater than 4 hours at 100%, indicating superb compliance with an average usage of 7 hours and 36 minutes, residual AHI 1.3 per hour, leaked low with the 95th percentile at 6.1 L/m on a pressure of 8 cm with EPR of 2. ?   ?I saw her on 10/08/2014, at which time she was doing well on CPAP therapy with full compliance and great results. She had a recent brain MRI for her acoustic neuroma at the time and her MRI was felt to be stable per her report. ?  ?I reviewed her CPAP compliance data from 07/17/2015 through  08/15/2015 which is a total of 30 days during which time she used her machine every day with percent used days greater than 100%, indicating superb compliance with an average usage of 7 hours and 27 minutes, residual AHI low at 1.3 per hour, leak low with the 95th percentile at 4 L/m on a pressure of 8 cm with EPR of 2. ?  ?I saw her on 04/08/2014, at which time she reported adjusting well to CPAP and endorse sleeping deeply and without is many interruptions. She felt memory was stable. She had started her diabetes medication. ?  ?I reviewed her compliance data from 09/05/2014 to 10/04/2014 which is a total of 30 days during which time she used her machine every night with percent  used days greater than 4 hours of 100%, indicating superb compliance. Average usage of 7 hours and 9 minutes, residual AHI low at 1.8 per hour and leak low at 6 L/m for the 95th percentile with the pressure currently at 8 cm with EPR of 2. ?  ?I saw her on 10/09/2013, at which time we discussed her recent baseline sleep study which confirmed mild to moderate sleep apnea. I felt that because of her underlying sleep related complaints and her medical history she would benefit from treatment with CPAP and suggested a repeat sleep study with CPAP titration. She had the study on 01/26/2014 and I went over her test results with her in detail today. Sleep efficiency was markedly reduced at 47.1% with a long latency to sleep of 48.5 minutes and wake after sleep onset of 165 minutes with moderate sleep fragmentation noted. She had an increased percentage of slow-wave sleep and a normal percentage of REM sleep with a prolonged REM latency. She had no significant periodic leg movements of sleep and no significant EKG changes. She had a baseline oxygen saturation of 93% with a nadir of 84%. Snoring was eliminated with CPAP which was started at 5 cm and titrated to 8 cm with elimination of her sleep disordered breathing and her snoring. REM sleep was  achieved supine REM sleep was not achieved. Based on her test results I prescribed CPAP for her. ?  ?I reviewed the patient's CPAP compliance data from 03/08/2014 to 04/06/2014, which is a total of 30 days, dur

## 2022-03-02 NOTE — Patient Instructions (Signed)
It was nice to see you again today.  I am glad to hear that you finally got your new CPAP machine.  You are compliant with treatment and your apnea is under very good control.  Keep up the good work!  Please follow-up to see Ward Givens, NP, routinely in 1 year.   ? ?Please continue using your CPAP regularly. While your insurance requires that you use CPAP at least 4 hours each night on 70% of the nights, I recommend, that you not skip any nights and use it throughout the night if you can. Getting used to CPAP and staying with the treatment long term does take time and patience and discipline. Untreated obstructive sleep apnea when it is moderate to severe can have an adverse impact on cardiovascular health and raise her risk for heart disease, arrhythmias, hypertension, congestive heart failure, stroke and diabetes. Untreated obstructive sleep apnea causes sleep disruption, nonrestorative sleep, and sleep deprivation. This can have an impact on your day to day functioning and cause daytime sleepiness and impairment of cognitive function, memory loss, mood disturbance, and problems focussing. Using CPAP regularly can improve these symptoms. ? ?

## 2022-03-09 ENCOUNTER — Encounter: Payer: Self-pay | Admitting: Neurology

## 2022-03-10 DIAGNOSIS — M25512 Pain in left shoulder: Secondary | ICD-10-CM | POA: Diagnosis not present

## 2022-03-10 NOTE — Progress Notes (Signed)
Sent message, via epic in basket, requesting order in epic from surgeon     03/10/22 0925  Preop Orders  Has preop orders? No  Name of staff/physician contacted for orders(Indicate phone or IB message) B. Owens Shark, PA-C

## 2022-03-13 ENCOUNTER — Ambulatory Visit
Admission: RE | Admit: 2022-03-13 | Discharge: 2022-03-13 | Disposition: A | Discharge status: Home / Self Care | Payer: Medicare HMO | Source: Ambulatory Visit | Attending: Orthopedic Surgery | Admitting: Orthopedic Surgery

## 2022-03-13 DIAGNOSIS — M25512 Pain in left shoulder: Secondary | ICD-10-CM

## 2022-03-13 DIAGNOSIS — M62512 Muscle wasting and atrophy, not elsewhere classified, left shoulder: Secondary | ICD-10-CM | POA: Diagnosis not present

## 2022-03-13 DIAGNOSIS — R200 Anesthesia of skin: Secondary | ICD-10-CM | POA: Diagnosis not present

## 2022-03-13 DIAGNOSIS — M19012 Primary osteoarthritis, left shoulder: Secondary | ICD-10-CM | POA: Diagnosis not present

## 2022-03-13 NOTE — Patient Instructions (Addendum)
DUE TO COVID-19 ONLY TWO VISITORS  (aged 74 and older)  ARE ALLOWED TO COME WITH YOU AND STAY IN THE WAITING ROOM ONLY DURING PRE OP AND PROCEDURE.   **NO VISITORS ARE ALLOWED IN THE SHORT STAY AREA OR RECOVERY ROOM!!**  IF YOU WILL BE ADMITTED INTO THE HOSPITAL YOU ARE ALLOWED ONLY FOUR SUPPORT PEOPLE DURING VISITATION HOURS ONLY (7 AM -8PM)   The support person(s) must pass our screening, gel in and out, and wear a mask at all times, including in the patient's room. Patients must also wear a mask when staff or their support person are in the room. Visitors GUEST BADGE MUST BE WORN VISIBLY  One adult visitor may remain with you overnight and MUST be in the room by 8 P.M.     Your procedure is scheduled on: 03/28/22   Report to [facility] Main Entrance    Report to admitting at  7:40 AM   Call this number if you have problems the morning of surgery [phone]   Do not eat food :After Midnight.   After Midnight you may have the following liquids until _7:00_ AM/  DAY OF SURGERY  Water Black Coffee (sugar ok, NO MILK/CREAM OR CREAMERS)  Tea (sugar ok, NO MILK/CREAM OR CREAMERS) regular and decaf                             Plain Jell-O (NO RED)                                           Fruit ices (not with fruit pulp, NO RED)                                     Popsicles (NO RED)                                                                  Juice: apple, WHITE grape, WHITE cranberry Sports drinks like Gatorade (NO RED) Clear broth(vegetable,chicken,beef)                         If you have questions, please contact your surgeon's office.       Oral Hygiene is also important to reduce your risk of infection.                                    Remember - BRUSH YOUR TEETH THE MORNING OF SURGERY WITH YOUR REGULAR TOOTHPASTE   Do NOT smoke after Midnight   Take these medicines the morning of surgery with A SIP OF WATER: Metoprolol . Do not take your Januvia the  day before surgery or the day of surgery. Maintain a good diabetic diet and test your sugars more frequently. Call you PCP if sugars are trending high.   Bring CPAP mask and tubing day of surgery.  You may not have any metal on your body including hair pins, jewelry, and body piercing             Do not wear make-up, lotions, powders, perfumes/cologne, or deodorant  Do not wear nail polish including gel and S&S, artificial/acrylic nails, or any other type of covering on natural nails including finger and toenails. If you have artificial nails, gel coating, etc. that needs to be removed by a nail salon please have this removed prior to surgery or surgery may need to be canceled/ delayed if the surgeon/ anesthesia feels like they are unable to be safely monitored.   Do not shave  48 hours prior to surgery.     Do not bring valuables to the hospital. [place].   Contacts, dentures or bridgework may not be worn into surgery.   Bring small overnight bag day of surgery.    Patients discharged on the day of surgery will not be allowed to drive home.  Someone NEEDS to stay with you for the first 24 hours after anesthesia.   Special Instructions: Bring a copy of your healthcare power of attorney and living will documents  the day of surgery if you haven't scanned them before.              Please read over the following fact sheets you were given: IF YOU HAVE QUESTIONS ABOUT YOUR PRE-OP INSTRUCTIONS PLEASE CALL [facility]- Preparing for Total Shoulder Arthroplasty    Before surgery, you can play an important role. Because skin is not sterile, your skin needs to be as free of germs as possible. You can reduce the number of germs on your skin by using the following products. Benzoyl Peroxide Gel Reduces the number of germs present on the skin Applied twice a day to shoulder area starting two days before  surgery    ==================================================================  Please follow these instructions carefully:  BENZOYL PEROXIDE 5% GEL  Please do not use if you have an allergy to benzoyl peroxide.   If your skin becomes reddened/irritated stop using the benzoyl peroxide.  Starting two days before surgery, apply as follows: Apply benzoyl peroxide in the morning and at night. Apply after taking a shower. If you are not taking a shower clean entire shoulder front, back, and side along with the armpit with a clean wet washcloth.  Place a quarter-sized dollop on your shoulder and rub in thoroughly, making sure to cover the front, back, and side of your shoulder, along with the armpit.   2 days before ____ AM   ____ PM              1 day before ____ AM   ____ PM                         Do this twice a day for two days.  (Last application is the night before surgery, AFTER using the CHG soap as described below).  Do NOT apply benzoyl peroxide gel on the day of surgery.      [place] - Preparing for Surgery Before surgery, you can play an important role.  Because skin is not sterile, your skin needs to be as free of germs as possible.  You can reduce the number of germs on your skin by washing with CHG (chlorahexidine gluconate)  soap before surgery.  CHG is an antiseptic cleaner which kills germs and bonds with the skin to continue killing germs even after washing. Please DO NOT use if you have an allergy to CHG or antibacterial soaps.  If your skin becomes reddened/irritated stop using the CHG and inform your nurse when you arrive at Short Stay. Do not shave (including legs and underarms) for at least 48 hours prior to the first CHG shower.   Please follow these instructions carefully:  1.  Shower with CHG Soap the night before surgery and the  morning of Surgery.  2.  If you choose to wash your hair, wash your hair first as usual with your  normal  shampoo.  3.  After you  shampoo, rinse your hair and body thoroughly to remove the  shampoo.                            4.  Use CHG as you would any other liquid soap.  You can apply chg directly  to the skin and wash                       Gently with a scrungie or clean washcloth.  5.  Apply the CHG Soap to your body ONLY FROM THE NECK DOWN.   Do not use on face/ open                           Wound or open sores. Avoid contact with eyes, ears mouth and genitals (private parts).                       Wash face,  Genitals (private parts) with your normal soap.             6.  Wash thoroughly, paying special attention to the area where your surgery  will be performed.  7.  Thoroughly rinse your body with warm water from the neck down.  8.  DO NOT shower/wash with your normal soap after using and rinsing off  the CHG Soap.                9.  Pat yourself dry with a clean towel.            10.  Wear clean pajamas.            11.  Place clean sheets on your bed the night of your first shower and do not  sleep with pets. Day of Surgery : Do not apply any lotions/deodorants the morning of surgery.  Please wear clean clothes to the hospital/surgery center.  FAILURE TO FOLLOW THESE INSTRUCTIONS MAY RESULT IN THE CANCELLATION OF YOUR SURGERY _   ________________________________________________________________________   Incentive Spirometer  An incentive spirometer is a tool that can help keep your lungs clear and active. This tool measures how well you are filling your lungs with each breath. Taking long deep breaths may help reverse or decrease the chance of developing breathing (pulmonary) problems (especially infection) following: A long period of time when you are unable to move or be active. BEFORE THE PROCEDURE  If the spirometer includes an indicator to show your best effort, your nurse or respiratory therapist will set it to a desired goal. If possible, sit up straight or lean slightly forward. Try not to  slouch. Hold the incentive spirometer in  an upright position. INSTRUCTIONS FOR USE  Sit on the edge of your bed if possible, or sit up as far as you can in bed or on a chair. Hold the incentive spirometer in an upright position. Breathe out normally. Place the mouthpiece in your mouth and seal your lips tightly around it. Breathe in slowly and as deeply as possible, raising the piston or the ball toward the top of the column. Hold your breath for 3-5 seconds or for as long as possible. Allow the piston or ball to fall to the bottom of the column. Remove the mouthpiece from your mouth and breathe out normally. Rest for a few seconds and repeat Steps 1 through 7 at least 10 times every 1-2 hours when you are awake. Take your time and take a few normal breaths between deep breaths. The spirometer may include an indicator to show your best effort. Use the indicator as a goal to work toward during each repetition. After each set of 10 deep breaths, practice coughing to be sure your lungs are clear. If you have an incision (the cut made at the time of surgery), support your incision when coughing by placing a pillow or rolled up towels firmly against it. Once you are able to get out of bed, walk around indoors and cough well. You may stop using the incentive spirometer when instructed by your caregiver.  RISKS AND COMPLICATIONS Take your time so you do not get dizzy or light-headed. If you are in pain, you may need to take or ask for pain medication before doing incentive spirometry. It is harder to take a deep breath if you are having pain. AFTER USE Rest and breathe slowly and easily. It can be helpful to keep track of a log of your progress. Your caregiver can provide you with a simple table to help with this. If you are using the spirometer at home, follow these instructions: [place] IF:  You are having difficultly using the spirometer. You have trouble using the spirometer as often as  instructed. Your pain medication is not giving enough relief while using the spirometer. You develop fever of 100.5 F (38.1 C) or higher. SEEK IMMEDIATE MEDICAL CARE IF:  You cough up bloody sputum that had not been present before. You develop fever of 102 F (38.9 C) or greater. You develop worsening pain at or near the incision site. MAKE SURE YOU:  Understand these instructions. Will watch your condition. Will get help right away if you are not doing well or get worse. Document Released: 02/19/2007 Document Revised: 01/01/2012 Document Reviewed: 04/22/2007 [facility] Patient Information 2014 [place], [place].   ________________________________________________________________________

## 2022-03-14 DIAGNOSIS — G4733 Obstructive sleep apnea (adult) (pediatric): Secondary | ICD-10-CM | POA: Diagnosis not present

## 2022-03-17 ENCOUNTER — Other Ambulatory Visit: Payer: Self-pay

## 2022-03-17 ENCOUNTER — Encounter (HOSPITAL_COMMUNITY)
Admission: RE | Admit: 2022-03-17 | Discharge: 2022-03-17 | Disposition: A | Discharge status: Home / Self Care | Payer: Medicare HMO | Source: Ambulatory Visit | Attending: Orthopedic Surgery | Admitting: Orthopedic Surgery

## 2022-03-17 ENCOUNTER — Encounter (HOSPITAL_COMMUNITY): Payer: Self-pay

## 2022-03-17 DIAGNOSIS — Z01818 Encounter for other preprocedural examination: Secondary | ICD-10-CM

## 2022-03-17 DIAGNOSIS — I10 Essential (primary) hypertension: Secondary | ICD-10-CM | POA: Insufficient documentation

## 2022-03-17 DIAGNOSIS — E119 Type 2 diabetes mellitus without complications: Secondary | ICD-10-CM | POA: Diagnosis not present

## 2022-03-17 DIAGNOSIS — Z01812 Encounter for preprocedural laboratory examination: Secondary | ICD-10-CM | POA: Insufficient documentation

## 2022-03-17 LAB — BASIC METABOLIC PANEL
Anion gap: 7 (ref 5–15)
BUN: 14 mg/dL (ref 8–23)
CO2: 26 mmol/L (ref 22–32)
Calcium: 9.6 mg/dL (ref 8.9–10.3)
Chloride: 106 mmol/L (ref 98–111)
Creatinine, Ser: 0.65 mg/dL (ref 0.44–1.00)
GFR, Estimated: >60 mL/min (ref >60)
Glucose, Bld: 108 mg/dL — ABNORMAL HIGH (ref 70–99)
Potassium: 4.5 mmol/L (ref 3.5–5.1)
Sodium: 139 mmol/L (ref 135–145)

## 2022-03-17 LAB — SURGICAL PCR SCREEN
MRSA, PCR: POSITIVE — AB
Staphylococcus aureus: POSITIVE — AB

## 2022-03-17 LAB — HEMOGLOBIN A1C
Hgb A1c MFr Bld: 5.9 % — ABNORMAL HIGH (ref 4.8–5.6)
Mean Plasma Glucose: 122.63 mg/dL

## 2022-03-17 LAB — CBC
HCT: 40.4 % (ref 36.0–46.0)
Hemoglobin: 13.8 g/dL (ref 12.0–15.0)
MCH: 30.2 pg (ref 26.0–34.0)
MCHC: 34.2 g/dL (ref 30.0–36.0)
MCV: 88.4 fL (ref 80.0–100.0)
Platelets: 163 10*3/uL (ref 150–400)
RBC: 4.57 MIL/uL (ref 3.87–5.11)
RDW: 12.5 % (ref 11.5–15.5)
WBC: 5.4 10*3/uL (ref 4.0–10.5)
nRBC: 0 % (ref 0.0–0.2)

## 2022-03-17 LAB — GLUCOSE, CAPILLARY: Glucose-Capillary: 122 mg/dL — ABNORMAL HIGH (ref 70–99)

## 2022-03-17 NOTE — Progress Notes (Signed)
Anesthesia note:  Bowel prep reminder:NA  PCP - Dr. Rogers Seeds Cardiologist -Dr. Derl Barrow Other-   Chest x-ray - no EKG - 01/30/22-epic Stress Test - 2019 ECHO - 2017 Cardiac Cath - no  Pacemaker/ICD device last checked:NA  Sleep Study - yes CPAP - yes   Fasting Blood Sugar - 99-110 Checks Blood Sugar QD_____  Blood Thinner:Xarelto/ Dr. Marlou Porch Blood Thinner Instructions:Stop 3 days prior to DOS Aspirin Instructions: Last Dose:03/24/22  Anesthesia review: yes  Patient denies shortness of breath, fever, cough and chest pain at PAT appointment Pt reports that she goes to the gym. She has no SOB with any activities  Patient verbalized understanding of instructions that were given to them at the PAT appointment. Patient was also instructed that they will need to review over the PAT instructions again at home before surgery. yes

## 2022-03-24 ENCOUNTER — Other Ambulatory Visit (HOSPITAL_COMMUNITY): Payer: Medicare HMO

## 2022-03-27 NOTE — Anesthesia Preprocedure Evaluation (Signed)
Anesthesia Evaluation  Patient identified by MRN, date of birth, ID band Patient awake    Reviewed: Allergy & Precautions, NPO status , Patient's Chart, lab work & pertinent test results  History of Anesthesia Complications Negative for: history of anesthetic complications  Airway Mallampati: III  TM Distance: >3 FB Neck ROM: Full    Dental  (+) Caps, Dental Advisory Given,  Lower bridge:   Pulmonary sleep apnea and Continuous Positive Airway Pressure Ventilation ,    Pulmonary exam normal        Cardiovascular hypertension, Pt. on home beta blockers and Pt. on medications Normal cardiovascular exam+ dysrhythmias (s/p ablation in xarelto, last dose Saturday 6pm ) Atrial Fibrillation   Stress Test 06/2018 Blood pressure demonstrated a normal response to exercise. There was no ST segment deviation noted during stress. Negative ETT but patient only achieved 78% of PMHR at 118 bpm Normal hemodynamic response Rare PVCls with stress   TTE 2017 - Left ventricle: The cavity size was normal. Systolic function was normal. The estimated ejection fraction was in the range of 60% to 65%. Wall motion was normal; there were no regional wall motion abnormalities. The transmitral flow pattern was normal.Left ventricular diastolic function parameters were normal.  - Aortic valve: Moderate focal calcification, consistent with  sclerosis. There was mild regurgitation.  - Mitral valve: There was mild regurgitation.   Neuro/Psych negative neurological ROS  negative psych ROS   GI/Hepatic negative GI ROS, Neg liver ROS,   Endo/Other  diabetes, Type 2, Oral Hypoglycemic Agents  Renal/GU negative Renal ROS  negative genitourinary   Musculoskeletal  (+) Arthritis ,   Abdominal   Peds  Hematology negative hematology ROS (+)   Anesthesia Other Findings   Reproductive/Obstetrics                             Anesthesia Physical  Anesthesia Plan  ASA: 3  Anesthesia Plan: General and Regional   Post-op Pain Management:  Regional for Post-op pain and Celebrex PO (pre-op)*, Tylenol PO (pre-op)* and Regional block*   Induction: Intravenous  PONV Risk Score and Plan: 3 and Dexamethasone, Ondansetron and Diphenhydramine  Airway Management Planned: Oral ETT  Additional Equipment:   Intra-op Plan:   Post-operative Plan: Extubation in OR  Informed Consent: I have reviewed the patients History and Physical, chart, labs and discussed the procedure including the risks, benefits and alternatives for the proposed anesthesia with the patient or authorized representative who has indicated his/her understanding and acceptance.     Dental advisory given  Plan Discussed with: Anesthesiologist and CRNA  Anesthesia Plan Comments:        Anesthesia Quick Evaluation

## 2022-03-27 NOTE — H&P (Signed)
SHOULDER ARTHROPLASTY ADMISSION H&P  Patient ID: Erika Kim MRN: [id number] DOB/AGE: 74-Nov-1947 74 y.o.  Chief Complaint: left shoulder pain.  Planned Procedure Date: 03/28/22 Medical Clearance by [name]   Additional clearance by Ambrose Pancoast, NP  HPI: Erika Kim is a 76 y.o. female who presents for evaluation of left shoulder OA, DJD. The patient has a history of pain and functional disability in the left shoulder due to arthritis and has failed non-surgical conservative treatments for greater than 12 weeks to include NSAID's and/or analgesics and activity modification.  Onset of symptoms was gradual, starting 5 months ago with rapidlly worsening course since that time. The patient noted no past surgery on the left shoulder.  Patient currently rates pain at 6 out of 10 with activity. Patient has worsening of pain with activity and weight bearing and pain that interferes with activities of daily living.  Patient has evidence of high riding humeral head with arthrosis by imaging studies.  There is no active infection.  Past Medical History:  Diagnosis Date   Acoustic neuroma ([place]) followed by dr Keone Kamer at [facility]   right side w/ chronic roaring noise   Anemia    hx of 2019 realted to Xarelto    Anticoagulant long-term use    xarelto   Benign paroxysmal positional vertigo    Dysrhythmia    afib- 2 ablations followed by Dr Marlou Porch - [facility] - 4/21    Endometrial polyp    Family history of adverse reaction to anesthesia    daughter- nausea    Frequency of urination    Hx of transfusion of packed red blood cells    related to small intestine in 2019    Hyperlipidemia    Hypertension    OA (osteoarthritis)    left knee   OSA on CPAP    cpap - setting at ? 8    PAF (paroxysmal atrial fibrillation) [facility]) cardiologist-  dr Marlou Porch   dx 04/ 2017   PMB (postmenopausal bleeding)    TMJ (temporomandibular joint disorder)    left side-- wears guard   Type 2 diabetes mellitus  ([place])    Wears contact lenses    Past Surgical History:  Procedure Laterality Date   ATRIAL FIBRILLATION ABLATION N/A 05/29/2019   Procedure: ATRIAL FIBRILLATION ABLATION;  Surgeon: [name], MD;  Location: [place] [facility];  Service: Cardiovascular;  Laterality: N/A;   ATRIAL FIBRILLATION ABLATION N/A 10/29/2019   Procedure: ATRIAL FIBRILLATION ABLATION;  Surgeon: [name], MD;  Location: [place] [facility];  Service: Cardiovascular;  Laterality: N/A;   CARDIOVERSION N/A 06/24/2019   Procedure: CARDIOVERSION;  Surgeon: Skeet Latch, MD;  Location: [place];  Service: Cardiovascular;  Laterality: N/A;   CARPAL TUNNEL RELEASE Right 04/17/2007   w/ Excision ganglion cyst and Pulley Release right thumb   DILATATION & CURETTAGE/HYSTEROSCOPY WITH MYOSURE N/A 01/12/2017   Procedure: DILATATION & CURETTAGE/HYSTEROSCOPY WITH MYOSURE;  Surgeon: Arvella Nigh, MD;  Location: [place];  Service: Gynecology;  Laterality: N/A;   ESOPHAGOGASTRODUODENOSCOPY (EGD) WITH PROPOFOL N/A 09/16/2018   Procedure: ESOPHAGOGASTRODUODENOSCOPY (EGD) WITH PROPOFOL;  Surgeon: Ronald Lobo, MD;  Location: [place];  Service: Endoscopy;  Laterality: N/A;   GIVENS CAPSULE STUDY N/A 09/16/2018   Procedure: GIVENS CAPSULE STUDY;  Surgeon: Ronald Lobo, MD;  Location: [place];  Service: Endoscopy;  Laterality: N/A;   HAMMER TOE SURGERY Left 2010   left second toe   IR RADIOLOGY PERIPHERAL GUIDED IV START  10/27/2019   IR US GUIDE VASC ACCESS RIGHT  10/27/2019   KNEE ARTHROSCOPY Right 2010   KNEE SURGERY Left 1993   LYMPH NODE BIOPSY N/A 03/01/2017   Procedure: SENTINEL LYMPH NODE BIOPSY;  Surgeon: Everitt Amber, MD;  Location: [facility];  Service: Gynecology;  Laterality: N/A;   NASAL SINUS SURGERY  1985 and 1995   ROBOTIC ASSISTED TOTAL HYSTERECTOMY WITH BILATERAL SALPINGO OOPHERECTOMY Bilateral 03/01/2017   Procedure: XI ROBOTIC ASSISTED TOTAL HYSTERECTOMY WITH  BILATERAL SALPINGO OOPHORECTOMY;  Surgeon: Everitt Amber, MD;  Location: [facility];  Service: Gynecology;  Laterality: Bilateral;   ROTATOR CUFF REPAIR Right 04/2004   TOE SURGERY Right 2012   Great toe and second toe   TOTAL ELBOW REPLACEMENT Right 2000   TOTAL KNEE ARTHROPLASTY Right 01/12/2010   TOTAL KNEE ARTHROPLASTY Left 03/02/2020   Procedure: TOTAL KNEE ARTHROPLASTY;  Surgeon: Renette Butters, MD;  Location: [facility];  Service: Orthopedics;  Laterality: Left;   TRANSTHORACIC ECHOCARDIOGRAM  03/28/2016   ef 60-65%/ mild AR and MR   TUBAL LIGATION Bilateral 1978   ULNAR NERVE TRANSPOSITION Right 04/21/2021   Procedure: RIGHT ULNAR NERVE DECOMPRESSION ELBOW;  Surgeon: Leanora Cover, MD;  Location: [place];  Service: Orthopedics;  Laterality: Right;  axillary block   Allergies  Allergen Reactions   Cephalexin Itching   Augmentin [Amoxicillin-Pot Clavulanate] Itching    severe     Cephalosporins Itching    severe   Prior to Admission medications   Medication Sig Start Date End Date Taking? Authorizing Provider  acetaminophen (TYLENOL) 650 MG CR tablet Take 1,300 mg by mouth every 8 (eight) hours as needed for pain.   Yes [provider]  atorvastatin (LIPITOR) 40 MG tablet Take 40 mg by mouth every morning.  08/07/13  Yes [provider]  betamethasone valerate (VALISONE) 0.1 % cream Apply 1 application. topically 2 (two) times a week.   Yes [provider]  Cholecalciferol (VITAMIN D3) 5000 units TABS Take 5,000 Units by mouth daily.    Yes [provider]  Cranberry-Vitamin C (CRANBERRY CONCENTRATE/VITAMINC PO) Take 1 tablet by mouth daily.    Yes [provider]  docusate sodium (COLACE) 100 MG capsule Take 100 mg by mouth 2 (two) times daily.   Yes [provider]  hydrochlorothiazide (HYDRODIURIL) 12.5 MG tablet Take 12.5 mg by mouth daily.    Yes [provider]  ipratropium (ATROVENT) 0.03 % nasal  spray Place 2 sprays into both nostrils every 12 (twelve) hours as needed for rhinitis.   Yes [provider]  JANUVIA 100 MG tablet Take 100 mg by mouth daily after supper.  04/01/14  Yes [provider]  metoprolol succinate (TOPROL-XL) 50 MG 24 hr tablet take 1 tablet by mouth in the morning and at bedtime with or immediately following a meal 07/18/21  Yes Camnitz, Will Hassell Done, MD  Multiple Vitamin (MULTIVITAMIN) tablet Take 1 tablet by mouth daily.   Yes [provider]  olmesartan (BENICAR) 20 MG tablet Take 20 mg by mouth daily. 08/28/19  Yes [provider]  Polyethyl Glycol-Propyl Glycol (SYSTANE OP) Place 1 drop into both eyes daily as needed (dry eyes).   Yes [provider]  rivaroxaban (XARELTO) 20 MG TABS tablet TAKE ONE TABLET BY MOUTH ONE TIME DAILY  with supper 06/29/21  Yes Camnitz, Will Hassell Done, MD  saline (AYR) GEL Place 1 application into the nose at bedtime.   Yes [provider]  triamcinolone cream (KENALOG)  0.1 % Apply 1 application. topically daily as needed for itching.   Yes [provider]  Lancets (ONETOUCH DELICA PLUS [id number]) [place]  01/08/19   [provider]  ONE TOUCH ULTRA TEST test strip  01/08/19   [provider]   Social History   Socioeconomic History   Marital status: Married    Spouse name: Not on file   Number of children: 2   Years of education: 12   Highest education level: Not on file  Occupational History   Not on file  Tobacco Use   Smoking status: Never   Smokeless tobacco: Never  Vaping Use   Vaping Use: Never used  Substance and Sexual Activity   Alcohol use: No   Drug use: No   Sexual activity: Not on file  Other Topics Concern   Not on file  Social History Narrative   Married w/2 children (boy and girl).  Retired.   Social Determinants of Health   Financial Resource Strain: Not on file  Food Insecurity: Not on file  Transportation Needs: Not on file   Physical Activity: Not on file  Stress: Not on file  Social Connections: Not on file   Family History  Problem Relation Age of Onset   Heart failure Mother    Heart failure Father    Sleep apnea Brother     ROS: Currently denies lightheadedness, dizziness, Fever, chills, CP, SOB.   No personal history of DVT, PE, MI, or CVA. No loose teeth or dentures All other systems have been reviewed and were otherwise currently negative with the exception of those mentioned in the HPI and as above.  Objective: Vitals: Ht: '5\' 2"'$  Wt: 211.4 lbs Temp: 97.6 BP: 153/81 Pulse: 56 O2 99% on room air.   Physical Exam: General: Alert, NAD.   HEENT: EOMI, Good Neck Extension  Pulm: No increased work of breathing.  Clear B/L A/P w/o crackle or wheeze.  CV: RRR, No m/g/r appreciated  GI: soft, NT, ND Neuro: Neuro without gross focal deficit.  Sensation intact distally Skin: No lesions in the area of chief complaint MSK/Surgical Site: left shoulder pain with range of motion.  Forward flexion/abduction approximately 0-130.  Internal rotation to L1.  External rotation to 70.  No AC pain.  No Biceps pain.  NVI distally.  Imaging Review X-rays from previous visit demonstrate a high riding humeral head with arthrosis. CT shows moderate glenohumeral OA with rotator cuff tear with severe supraspinatus and infraspinatus muscle atrophy.  Assessment: Left shoulder rotator cuff arthropathy   Plan: Plan for Procedure(s): REVERSE SHOULDER ARTHROPLASTY  The patient history, physical exam, clinical judgement of the provider and imaging are consistent with end stage degenerative joint disease and reverse total joint arthroplasty is deemed medically necessary. The treatment options including medical management, injection therapy, and arthroplasty were discussed at length. The risks and benefits of Procedure(s): REVERSE SHOULDER ARTHROPLASTY were presented and reviewed.  The risks of nonoperative treatment, versus  surgical intervention including but not limited to continued pain, aseptic loosening, stiffness, dislocation/subluxation, infection, bleeding, nerve injury, blood clots, cardiopulmonary complications, morbidity, mortality, among others were discussed. The patient verbalizes understanding and wishes to proceed with the plan.  Patient is being admitted for surgery, OT, pain control, prophylactic antibiotics, VTE prophylaxis, progressive ambulation, ADL's and discharge planning.   Dental prophylaxis discussed and recommended for 2 years postoperatively.  The patient does meet the criteria for TXA which will be used perioperatively.   The patient is planning to  be discharged home in care of her husband.    Ventura Bruns, PA-C 03/27/2022 2:11 PM

## 2022-03-28 ENCOUNTER — Other Ambulatory Visit: Payer: Self-pay

## 2022-03-28 ENCOUNTER — Ambulatory Visit (HOSPITAL_BASED_OUTPATIENT_CLINIC_OR_DEPARTMENT_OTHER): Payer: Medicare HMO | Admitting: Certified Registered Nurse Anesthetist

## 2022-03-28 ENCOUNTER — Observation Stay (HOSPITAL_COMMUNITY)
Admission: RE | Admit: 2022-03-28 | Discharge: 2022-03-29 | Disposition: A | Discharge status: Home / Self Care | Payer: Medicare HMO | Attending: Orthopedic Surgery | Admitting: Orthopedic Surgery

## 2022-03-28 ENCOUNTER — Observation Stay (HOSPITAL_COMMUNITY): Payer: Medicare HMO

## 2022-03-28 ENCOUNTER — Encounter (HOSPITAL_COMMUNITY): Payer: Self-pay | Admitting: Orthopedic Surgery

## 2022-03-28 ENCOUNTER — Ambulatory Visit (HOSPITAL_COMMUNITY): Payer: Medicare HMO | Admitting: Physician Assistant

## 2022-03-28 ENCOUNTER — Encounter (HOSPITAL_COMMUNITY)
Admission: RE | Disposition: A | Discharge status: Home / Self Care | Payer: Self-pay | Source: Home / Self Care | Attending: Orthopedic Surgery

## 2022-03-28 DIAGNOSIS — M12812 Other specific arthropathies, not elsewhere classified, left shoulder: Secondary | ICD-10-CM | POA: Diagnosis not present

## 2022-03-28 DIAGNOSIS — Z7984 Long term (current) use of oral hypoglycemic drugs: Secondary | ICD-10-CM | POA: Diagnosis not present

## 2022-03-28 DIAGNOSIS — G8918 Other acute postprocedural pain: Secondary | ICD-10-CM | POA: Diagnosis not present

## 2022-03-28 DIAGNOSIS — M75102 Unspecified rotator cuff tear or rupture of left shoulder, not specified as traumatic: Principal | ICD-10-CM | POA: Insufficient documentation

## 2022-03-28 DIAGNOSIS — G4733 Obstructive sleep apnea (adult) (pediatric): Secondary | ICD-10-CM | POA: Diagnosis not present

## 2022-03-28 DIAGNOSIS — Z96612 Presence of left artificial shoulder joint: Secondary | ICD-10-CM | POA: Diagnosis not present

## 2022-03-28 DIAGNOSIS — Z471 Aftercare following joint replacement surgery: Secondary | ICD-10-CM | POA: Diagnosis not present

## 2022-03-28 DIAGNOSIS — I10 Essential (primary) hypertension: Secondary | ICD-10-CM | POA: Insufficient documentation

## 2022-03-28 DIAGNOSIS — Z9989 Dependence on other enabling machines and devices: Secondary | ICD-10-CM

## 2022-03-28 DIAGNOSIS — Z96653 Presence of artificial knee joint, bilateral: Secondary | ICD-10-CM | POA: Diagnosis not present

## 2022-03-28 DIAGNOSIS — Z01818 Encounter for other preprocedural examination: Secondary | ICD-10-CM

## 2022-03-28 DIAGNOSIS — Z79899 Other long term (current) drug therapy: Secondary | ICD-10-CM | POA: Insufficient documentation

## 2022-03-28 DIAGNOSIS — I480 Paroxysmal atrial fibrillation: Secondary | ICD-10-CM | POA: Diagnosis not present

## 2022-03-28 DIAGNOSIS — Z96642 Presence of left artificial hip joint: Secondary | ICD-10-CM | POA: Diagnosis not present

## 2022-03-28 DIAGNOSIS — E119 Type 2 diabetes mellitus without complications: Secondary | ICD-10-CM | POA: Diagnosis not present

## 2022-03-28 DIAGNOSIS — M19012 Primary osteoarthritis, left shoulder: Secondary | ICD-10-CM | POA: Diagnosis not present

## 2022-03-28 DIAGNOSIS — Z7901 Long term (current) use of anticoagulants: Secondary | ICD-10-CM | POA: Insufficient documentation

## 2022-03-28 HISTORY — PX: REVERSE SHOULDER ARTHROPLASTY: SHX5054

## 2022-03-28 LAB — GLUCOSE, CAPILLARY
Glucose-Capillary: 115 mg/dL — ABNORMAL HIGH (ref 70–99)
Glucose-Capillary: 98 mg/dL (ref 70–99)
Glucose-Capillary: 106 mg/dL — ABNORMAL HIGH (ref 70–99)

## 2022-03-28 SURGERY — ARTHROPLASTY, SHOULDER, TOTAL, REVERSE
Anesthesia: Regional | Site: Shoulder | Laterality: Left

## 2022-03-28 MED ORDER — BUPIVACAINE LIPOSOME 1.3 % IJ SUSP
INTRAMUSCULAR | Status: DC | PRN
  Administered 2022-03-28: 10 mL via PERINEURAL

## 2022-03-28 MED ORDER — DIPHENHYDRAMINE HCL 50 MG/ML IJ SOLN
INTRAMUSCULAR | Status: DC | PRN
  Administered 2022-03-28: 12.5 mg via INTRAVENOUS

## 2022-03-28 MED ORDER — DIPHENHYDRAMINE HCL 12.5 MG/5ML PO ELIX: 12.5000 mg | ORAL_SOLUTION | ORAL | Status: DC | PRN

## 2022-03-28 MED ORDER — OXYCODONE HCL 5 MG PO TABS: 5.0000 mg | ORAL_TABLET | ORAL | Status: DC | PRN

## 2022-03-28 MED ORDER — BUPIVACAINE HCL (PF) 0.25 % IJ SOLN
INTRAMUSCULAR | Status: AC
  Filled 2022-03-28: qty 30

## 2022-03-28 MED ORDER — RIVAROXABAN 10 MG PO TABS: 20.0000 mg | ORAL_TABLET | Freq: Every day | ORAL | Status: DC

## 2022-03-28 MED ORDER — CEFAZOLIN SODIUM 1 G IJ SOLR
INTRAMUSCULAR | Status: AC
  Filled 2022-03-28: qty 20

## 2022-03-28 MED ORDER — LINAGLIPTIN 5 MG PO TABS
5.0000 mg | ORAL_TABLET | Freq: Every day | ORAL | Status: DC
  Administered 2022-03-28: 5 mg via ORAL
  Filled 2022-03-28: qty 1

## 2022-03-28 MED ORDER — METHOCARBAMOL 1000 MG/10ML IJ SOLN: 500.0000 mg | Freq: Four times a day (QID) | INTRAVENOUS | Status: DC | PRN

## 2022-03-28 MED ORDER — DEXAMETHASONE SODIUM PHOSPHATE 4 MG/ML IJ SOLN
INTRAMUSCULAR | Status: DC | PRN
  Administered 2022-03-28: 5 mg via INTRAVENOUS

## 2022-03-28 MED ORDER — ACETAMINOPHEN 500 MG PO TABS
1000.0000 mg | ORAL_TABLET | Freq: Once | ORAL | Status: AC
  Administered 2022-03-28: 1000 mg via ORAL
  Filled 2022-03-28: qty 2

## 2022-03-28 MED ORDER — PROMETHAZINE HCL 25 MG/ML IJ SOLN: 6.2500 mg | INTRAMUSCULAR | Status: DC | PRN

## 2022-03-28 MED ORDER — DIPHENHYDRAMINE HCL 50 MG/ML IJ SOLN
INTRAMUSCULAR | Status: AC
  Filled 2022-03-28: qty 1

## 2022-03-28 MED ORDER — HYDROCHLOROTHIAZIDE 12.5 MG PO TABS
12.5000 mg | ORAL_TABLET | Freq: Every day | ORAL | Status: DC
  Administered 2022-03-28: 12.5 mg via ORAL
  Filled 2022-03-28: qty 1

## 2022-03-28 MED ORDER — FLEET ENEMA 7-19 GM/118ML RE ENEM: 1.0000 | ENEMA | Freq: Once | RECTAL | Status: DC | PRN

## 2022-03-28 MED ORDER — METOCLOPRAMIDE HCL 5 MG PO TABS: 5.0000 mg | ORAL_TABLET | Freq: Three times a day (TID) | ORAL | Status: DC | PRN

## 2022-03-28 MED ORDER — POLYETHYLENE GLYCOL 3350 17 G PO PACK: 17.0000 g | PACK | Freq: Every day | ORAL | Status: DC | PRN

## 2022-03-28 MED ORDER — EPHEDRINE SULFATE-NACL 50-0.9 MG/10ML-% IV SOSY
PREFILLED_SYRINGE | INTRAVENOUS | Status: DC | PRN
  Administered 2022-03-28: 7.5 mg via INTRAVENOUS
  Administered 2022-03-28: 5 mg via INTRAVENOUS

## 2022-03-28 MED ORDER — IRBESARTAN 150 MG PO TABS: 150.0000 mg | ORAL_TABLET | Freq: Every day | ORAL | Status: DC

## 2022-03-28 MED ORDER — FENTANYL CITRATE PF 50 MCG/ML IJ SOSY: 25.0000 ug | PREFILLED_SYRINGE | INTRAMUSCULAR | Status: DC | PRN

## 2022-03-28 MED ORDER — MENTHOL 3 MG MT LOZG: 1.0000 | LOZENGE | OROMUCOSAL | Status: DC | PRN

## 2022-03-28 MED ORDER — STERILE WATER FOR IRRIGATION IR SOLN
Status: DC | PRN
  Administered 2022-03-28: 2000 mL

## 2022-03-28 MED ORDER — DEXAMETHASONE SODIUM PHOSPHATE 10 MG/ML IJ SOLN
INTRAMUSCULAR | Status: AC
  Filled 2022-03-28: qty 1

## 2022-03-28 MED ORDER — ORAL CARE MOUTH RINSE: 15.0000 mL | Freq: Once | OROMUCOSAL | Status: AC

## 2022-03-28 MED ORDER — CEFAZOLIN SODIUM-DEXTROSE 2-4 GM/100ML-% IV SOLN
2.0000 g | Freq: Four times a day (QID) | INTRAVENOUS | Status: AC
  Administered 2022-03-28 – 2022-03-29 (×3): 2 g via INTRAVENOUS
  Filled 2022-03-28 (×3): qty 100

## 2022-03-28 MED ORDER — PROPOFOL 10 MG/ML IV BOLUS
INTRAVENOUS | Status: AC
  Filled 2022-03-28: qty 20

## 2022-03-28 MED ORDER — POVIDONE-IODINE 10 % EX SWAB
2.0000 "application " | Freq: Once | CUTANEOUS | Status: AC
  Administered 2022-03-28: 2 via TOPICAL

## 2022-03-28 MED ORDER — METHOCARBAMOL 500 MG PO TABS: 500.0000 mg | ORAL_TABLET | Freq: Four times a day (QID) | ORAL | Status: DC | PRN

## 2022-03-28 MED ORDER — HYDROMORPHONE HCL 1 MG/ML IJ SOLN: 0.5000 mg | INTRAMUSCULAR | Status: DC | PRN

## 2022-03-28 MED ORDER — ONDANSETRON HCL 4 MG/2ML IJ SOLN
INTRAMUSCULAR | Status: DC | PRN
  Administered 2022-03-28: 4 mg via INTRAVENOUS

## 2022-03-28 MED ORDER — ONDANSETRON HCL 4 MG/2ML IJ SOLN: 4.0000 mg | Freq: Four times a day (QID) | INTRAMUSCULAR | Status: DC | PRN

## 2022-03-28 MED ORDER — FENTANYL CITRATE PF 50 MCG/ML IJ SOSY
PREFILLED_SYRINGE | INTRAMUSCULAR | Status: AC
  Administered 2022-03-28: 50 ug
  Filled 2022-03-28: qty 2

## 2022-03-28 MED ORDER — EPHEDRINE 5 MG/ML INJ
INTRAVENOUS | Status: AC
Start: 2022-03-28 — End: ?
  Filled 2022-03-28: qty 5

## 2022-03-28 MED ORDER — ROCURONIUM BROMIDE 10 MG/ML (PF) SYRINGE
PREFILLED_SYRINGE | INTRAVENOUS | Status: DC | PRN
  Administered 2022-03-28: 80 mg via INTRAVENOUS

## 2022-03-28 MED ORDER — BISACODYL 10 MG RE SUPP: 10.0000 mg | Freq: Every day | RECTAL | Status: DC | PRN

## 2022-03-28 MED ORDER — DEXAMETHASONE SODIUM PHOSPHATE 10 MG/ML IJ SOLN
INTRAMUSCULAR | Status: AC
  Filled 2022-03-28: qty 2

## 2022-03-28 MED ORDER — MIDAZOLAM HCL 2 MG/2ML IJ SOLN
INTRAMUSCULAR | Status: AC
  Administered 2022-03-28: 1 mg
  Filled 2022-03-28: qty 2

## 2022-03-28 MED ORDER — ONDANSETRON HCL 4 MG/2ML IJ SOLN
INTRAMUSCULAR | Status: AC
  Filled 2022-03-28: qty 4

## 2022-03-28 MED ORDER — AYR SALINE NASAL NA GEL
1.0000 | Freq: Every day | NASAL | Status: DC
Start: 2022-03-28 — End: 2022-03-29
  Filled 2022-03-28: qty 14.1

## 2022-03-28 MED ORDER — LIDOCAINE HCL (PF) 2 % IJ SOLN
INTRAMUSCULAR | Status: AC
  Filled 2022-03-28: qty 15

## 2022-03-28 MED ORDER — FENTANYL CITRATE PF 50 MCG/ML IJ SOSY
50.0000 ug | PREFILLED_SYRINGE | Freq: Once | INTRAMUSCULAR | Status: DC
  Filled 2022-03-28: qty 1

## 2022-03-28 MED ORDER — ACETAMINOPHEN 325 MG PO TABS: 325.0000 mg | ORAL_TABLET | Freq: Four times a day (QID) | ORAL | Status: DC | PRN

## 2022-03-28 MED ORDER — METOPROLOL SUCCINATE ER 50 MG PO TB24
50.0000 mg | ORAL_TABLET | Freq: Two times a day (BID) | ORAL | Status: DC
  Administered 2022-03-28: 50 mg via ORAL
  Filled 2022-03-28: qty 1

## 2022-03-28 MED ORDER — ROCURONIUM BROMIDE 10 MG/ML (PF) SYRINGE
PREFILLED_SYRINGE | INTRAVENOUS | Status: AC
  Filled 2022-03-28: qty 10

## 2022-03-28 MED ORDER — POTASSIUM CHLORIDE IN NACL 20-0.9 MEQ/L-% IV SOLN
INTRAVENOUS | Status: DC
  Filled 2022-03-28 (×2): qty 1000

## 2022-03-28 MED ORDER — ALUM & MAG HYDROXIDE-SIMETH 200-200-20 MG/5ML PO SUSP: 30.0000 mL | ORAL | Status: DC | PRN

## 2022-03-28 MED ORDER — VANCOMYCIN HCL IN DEXTROSE 1-5 GM/200ML-% IV SOLN
1000.0000 mg | INTRAVENOUS | Status: AC
  Administered 2022-03-28 (×2): 1000 mg via INTRAVENOUS
  Filled 2022-03-28: qty 200

## 2022-03-28 MED ORDER — POLYVINYL ALCOHOL 1.4 % OP SOLN: 1.0000 [drp] | Freq: Every day | OPHTHALMIC | Status: DC | PRN

## 2022-03-28 MED ORDER — SUGAMMADEX SODIUM 200 MG/2ML IV SOLN
INTRAVENOUS | Status: DC | PRN
  Administered 2022-03-28: 200 mg via INTRAVENOUS

## 2022-03-28 MED ORDER — IPRATROPIUM BROMIDE 0.03 % NA SOLN
2.0000 | Freq: Two times a day (BID) | NASAL | Status: DC | PRN
Start: 2022-03-28 — End: 2022-03-29

## 2022-03-28 MED ORDER — 0.9 % SODIUM CHLORIDE (POUR BTL) OPTIME
TOPICAL | Status: DC | PRN
  Administered 2022-03-28: 1000 mL

## 2022-03-28 MED ORDER — PHENYLEPHRINE HCL-NACL 20-0.9 MG/250ML-% IV SOLN
INTRAVENOUS | Status: DC | PRN
  Administered 2022-03-28: 40 ug/min via INTRAVENOUS

## 2022-03-28 MED ORDER — ONDANSETRON HCL 4 MG/2ML IJ SOLN
INTRAMUSCULAR | Status: AC
  Filled 2022-03-28: qty 2

## 2022-03-28 MED ORDER — METOCLOPRAMIDE HCL 5 MG/ML IJ SOLN: 5.0000 mg | Freq: Three times a day (TID) | INTRAMUSCULAR | Status: DC | PRN

## 2022-03-28 MED ORDER — PROPOFOL 10 MG/ML IV BOLUS
INTRAVENOUS | Status: DC | PRN
  Administered 2022-03-28: 150 mg via INTRAVENOUS

## 2022-03-28 MED ORDER — PHENOL 1.4 % MT LIQD: 1.0000 | OROMUCOSAL | Status: DC | PRN

## 2022-03-28 MED ORDER — LACTATED RINGERS IV SOLN: INTRAVENOUS | Status: DC

## 2022-03-28 MED ORDER — AMISULPRIDE (ANTIEMETIC) 5 MG/2ML IV SOLN: 10.0000 mg | Freq: Once | INTRAVENOUS | Status: DC | PRN

## 2022-03-28 MED ORDER — LIDOCAINE HCL (PF) 2 % IJ SOLN
INTRAMUSCULAR | Status: AC
  Filled 2022-03-28: qty 5

## 2022-03-28 MED ORDER — CELECOXIB 200 MG PO CAPS
200.0000 mg | ORAL_CAPSULE | Freq: Once | ORAL | Status: AC
  Administered 2022-03-28: 200 mg via ORAL
  Filled 2022-03-28: qty 1

## 2022-03-28 MED ORDER — CEFAZOLIN SODIUM-DEXTROSE 2-3 GM-%(50ML) IV SOLR
INTRAVENOUS | Status: DC | PRN
  Administered 2022-03-28: 2 g via INTRAVENOUS

## 2022-03-28 MED ORDER — MIDAZOLAM HCL 2 MG/2ML IJ SOLN: 1.0000 mg | Freq: Once | INTRAMUSCULAR | Status: DC

## 2022-03-28 MED ORDER — ACETAMINOPHEN 500 MG PO TABS
1000.0000 mg | ORAL_TABLET | Freq: Four times a day (QID) | ORAL | Status: DC
  Administered 2022-03-28 – 2022-03-29 (×3): 1000 mg via ORAL
  Filled 2022-03-28 (×3): qty 2

## 2022-03-28 MED ORDER — ATORVASTATIN CALCIUM 40 MG PO TABS: 40.0000 mg | ORAL_TABLET | Freq: Every morning | ORAL | Status: DC

## 2022-03-28 MED ORDER — BUPIVACAINE HCL (PF) 0.5 % IJ SOLN
INTRAMUSCULAR | Status: DC | PRN
  Administered 2022-03-28: 15 mL via PERINEURAL

## 2022-03-28 MED ORDER — OXYCODONE HCL 5 MG PO TABS: 10.0000 mg | ORAL_TABLET | ORAL | Status: DC | PRN

## 2022-03-28 MED ORDER — TRANEXAMIC ACID-NACL 1000-0.7 MG/100ML-% IV SOLN
1000.0000 mg | INTRAVENOUS | Status: AC
  Administered 2022-03-28: 1000 mg via INTRAVENOUS
  Filled 2022-03-28: qty 100

## 2022-03-28 MED ORDER — ONDANSETRON HCL 4 MG PO TABS: 4.0000 mg | ORAL_TABLET | Freq: Four times a day (QID) | ORAL | Status: DC | PRN

## 2022-03-28 MED ORDER — CHLORHEXIDINE GLUCONATE 0.12 % MT SOLN
15.0000 mL | Freq: Once | OROMUCOSAL | Status: AC
  Administered 2022-03-28: 15 mL via OROMUCOSAL

## 2022-03-28 SURGICAL SUPPLY — 59 items
BAG COUNTER SPONGE SURGICOUNT (BAG) IMPLANT
BAG ZIPLOCK 12X15 (MISCELLANEOUS) ×2 IMPLANT
BASEPLATE GLENOSPHERE 25 (Plate) ×1 IMPLANT
BEARING HUMERAL SHLDER 36M STD (Shoulder) IMPLANT
BIT DRILL TWIST 2.7 (BIT) ×1 IMPLANT
BLADE SAW SAG 73X25 THK (BLADE) ×1
BLADE SAW SGTL 73X25 THK (BLADE) ×1 IMPLANT
BOOTIES KNEE HIGH SLOAN (MISCELLANEOUS) ×2 IMPLANT
CLSR STERI-STRIP ANTIMIC 1/2X4 (GAUZE/BANDAGES/DRESSINGS) ×2 IMPLANT
COVER BACK TABLE 60X90IN (DRAPES) ×2 IMPLANT
COVER SURGICAL LIGHT HANDLE (MISCELLANEOUS) ×2 IMPLANT
DRAPE ORTHO SPLIT 77X108 STRL (DRAPES) ×4
DRAPE SHEET LG 3/4 BI-LAMINATE (DRAPES) ×4 IMPLANT
DRAPE SURG 17X11 SM STRL (DRAPES) ×2 IMPLANT
DRAPE SURG ORHT 6 SPLT 77X108 (DRAPES) ×2 IMPLANT
DRAPE U-SHAPE 47X51 STRL (DRAPES) ×2 IMPLANT
DRSG MEPILEX BORDER 4X8 (GAUZE/BANDAGES/DRESSINGS) ×2 IMPLANT
DURAPREP 26ML APPLICATOR (WOUND CARE) ×4 IMPLANT
ELECT REM PT RETURN 15FT ADLT (MISCELLANEOUS) ×2 IMPLANT
FACESHIELD WRAPAROUND (MASK) IMPLANT
FACESHIELD WRAPAROUND OR TEAM (MASK) IMPLANT
GLENOID SPHERE STD STRL 36MM (Orthopedic Implant) ×1 IMPLANT
GLOVE BIO SURGEON STRL SZ 6.5 (GLOVE) ×2 IMPLANT
GLOVE BIOGEL PI IND STRL 7.0 (GLOVE) ×1 IMPLANT
GLOVE BIOGEL PI IND STRL 8 (GLOVE) ×1 IMPLANT
GLOVE BIOGEL PI INDICATOR 7.0 (GLOVE) ×1
GLOVE BIOGEL PI INDICATOR 8 (GLOVE) ×1
GLOVE ORTHO TXT STRL SZ7.5 (GLOVE) ×2 IMPLANT
GOWN STRL REIN XL XLG (GOWN DISPOSABLE) ×4 IMPLANT
HOOD PEEL AWAY FLYTE STAYCOOL (MISCELLANEOUS) ×4 IMPLANT
KIT BASIN OR (CUSTOM PROCEDURE TRAY) ×2 IMPLANT
KIT TURNOVER KIT A (KITS) IMPLANT
PACK SHOULDER (CUSTOM PROCEDURE TRAY) ×2 IMPLANT
PIN STEINMANN THREADED TIP (PIN) ×1 IMPLANT
PIN THREADED REVERSE (PIN) ×1 IMPLANT
PROTECTOR NERVE ULNAR (MISCELLANEOUS) ×2 IMPLANT
RESTRAINT HEAD UNIVERSAL NS (MISCELLANEOUS) ×2 IMPLANT
SCREW BONE LOCKING 4.75X30X3.5 (Screw) ×1 IMPLANT
SCREW BONE STRL 6.5MMX25MM (Screw) ×1 IMPLANT
SCREW LOCKING 4.75MMX15MM (Screw) ×2 IMPLANT
SCREW LOCKING NS 4.75MMX20MM (Screw) ×1 IMPLANT
SHOULDER HUMERAL BEAR 36M STD (Shoulder) ×4 IMPLANT
SLING ARM IMMOBILIZER LRG (SOFTGOODS) ×2 IMPLANT
SMARTMIX MINI TOWER (MISCELLANEOUS)
SPIKE FLUID TRANSFER (MISCELLANEOUS) IMPLANT
SPONGE T-LAP 4X18 ~~LOC~~+RFID (SPONGE) IMPLANT
STEM HUMERAL STRL 11MMX55MM (Stem) ×1 IMPLANT
SUCTION FRAZIER HANDLE 12FR (TUBING) ×2
SUCTION TUBE FRAZIER 12FR DISP (TUBING) ×1 IMPLANT
SUPPORT WRAP ARM LG (MISCELLANEOUS) ×2 IMPLANT
SUT MAXBRAID #2 CVD NDL (SUTURE) ×1 IMPLANT
SUT MAXBRAID #5 CCS-NDL 2PK (SUTURE) ×1 IMPLANT
SUT VIC AB 1 CT1 36 (SUTURE) ×2 IMPLANT
SUT VIC AB 2-0 CT1 27 (SUTURE) ×2
SUT VIC AB 2-0 CT1 TAPERPNT 27 (SUTURE) ×1 IMPLANT
SUT VIC AB 3-0 SH 8-18 (SUTURE) ×2 IMPLANT
TOWEL OR 17X26 10 PK STRL BLUE (TOWEL DISPOSABLE) ×2 IMPLANT
TOWEL OR NON WOVEN STRL DISP B (DISPOSABLE) ×2 IMPLANT
TOWER SMARTMIX MINI (MISCELLANEOUS) IMPLANT

## 2022-03-28 NOTE — Interval H&P Note (Signed)
History and Physical Interval Note:  03/28/2022 8:56 AM  Erika Kim  has presented today for surgery, with the diagnosis of left shoulder OA, DJD.  The various methods of treatment have been discussed with the patient and family. After consideration of risks, benefits and other options for treatment, the patient has consented to  Procedure(s): REVERSE SHOULDER ARTHROPLASTY (Left) as a surgical intervention.  The patient's history has been reviewed, patient examined, no change in status, stable for surgery.  I have reviewed the patient's chart and labs.  Questions were answered to the patient's satisfaction.     [name]

## 2022-03-28 NOTE — Op Note (Addendum)
03/28/2022  11:53 AM  PATIENT:  Erika Kim    PRE-OPERATIVE DIAGNOSIS: Left rotator cuff arthropathy, shoulder  POST-OPERATIVE DIAGNOSIS:  Same  PROCEDURE: LEFT reverse Total Shoulder Arthroplasty  SURGEON:  [name], MD  PHYSICIAN ASSISTANT: Merlene Pulling, PA-C, present and scrubbed throughout the case, critical for completion in a timely fashion, and for retraction, instrumentation, and closure.  ANESTHESIA:   General with interscalene block using Exparel  ESTIMATED BLOOD LOSS: 150 mL  UNIQUE ASPECTS OF THE CASE: The biceps tendon was not present.  There was advanced cuff arthropathy with eburnation on both the glenoid and the humeral head side.  It was a little bit challenging getting the glenosphere and, but ultimately it seated nicely.  The glenoid morphology was fairly small, and the reverse baseplate filled the face of the glenoid.  I reamed just slightly more anteriorly than posteriorly, and slightly more inferiorly.  PREOPERATIVE INDICATIONS:  Erika Kim is a  76 y.o. female with a diagnosis of left shoulder rotator cuff arthropathy who failed conservative measures and elected for surgical management.    The risks benefits and alternatives were discussed with the patient preoperatively including but not limited to the risks of infection, bleeding, nerve injury, cardiopulmonary complications, the need for revision surgery, dislocation, brachial plexus palsy, incomplete relief of pain, among others, and the patient was willing to proceed.  OPERATIVE IMPLANTS: Biomet size 11 micro humeral stem press-fit with a 40 + 0 mm offset reverse shoulder arthroplasty tray with a 36 mm Vivacit-E standardy polyethylene liner and a 36 mm glenosphere set on B placed with inferior offset, with a mini baseplate and 4 locking screws and one central nonlocking screw.  OPERATIVE FINDINGS: Advanced rotator cuff arthropathy.  OPERATIVE PROCEDURE: The patient was brought to the operating  room and placed in the supine position. General anesthesia was administered. IV antibiotics were given.  I gave her both vancomycin, because of a positive MRSA screen, and also Ancef, and she tolerated the Ancef without complication.  Time out was performed. The upper extremity was prepped and draped in usual sterile fashion. The patient was in a beachchair position. Deltopectoral approach was carried out.  The biceps was not present.  The subscapularis was released off of the bone.   I then performed circumferential releases of the humerus, and then dislocated the head, and then reamed with the reamer to the above named size.  I then applied the jig, and cut the humeral head in 30 of retroversion, and then turned my attention to the glenoid.  Deep retractors were placed, and I resected the labrum, and then placed a guidepin into the center position on the glenoid, with slight inferior inclination. I then reamed over the guidepin, and this created a small metaphyseal cancellus blush inferiorly, removing just the cartilage to the subchondral bone superiorly. The base plate was selected and impacted place, and then I secured it centrally with a nonlocking screw, and I had excellent purchase both inferiorly and superiorly. I placed a short locking screws on anterior and posterior aspects.  I then turned my attention to the glenosphere, and impacted this into place, placing slight inferior offset (set on B).   The glenosphere was completely seated, and had engagement of the [facility] taper. I then turned my attention back to the humerus.  I sequentially broached, and then trialed, and was found to restore soft tissue tension, and it had 3 finger tightness. Therefore the above named components were selected. The shoulder felt  stable throughout functional motion.  Before I placed the real prosthesis I had also placed a total of 3 #2 FiberWire through drill holes in the humerus for later subscapularis  repair.  I then impacted the real prosthesis into place, as well as the real humeral tray, and reduced the shoulder. The shoulder had excellent motion, and was stable, and I irrigated the wounds copiously.    I then used these to repair the subscapularis. This came down to bone.  I then irrigated the shoulder copiously once more, repaired the deltopectoral interval with Vicryl followed by subcutaneous Vicryl with Steri-Strips and sterile gauze for the skin. The patient was awakened and returned back in stable and satisfactory condition. There were no complications and She tolerated the procedure well.

## 2022-03-28 NOTE — Progress Notes (Signed)
Pt set up on cpap for the night. ?

## 2022-03-28 NOTE — Transfer of Care (Signed)
Immediate Anesthesia Transfer of Care Note  Patient: Erika Kim  Procedure(s) Performed: REVERSE SHOULDER ARTHROPLASTY (Left: Shoulder)  Patient Location: PACU  Anesthesia Type:GA combined with regional for post-op pain  Level of Consciousness: awake and patient cooperative  Airway & Oxygen Therapy: Patient Spontanous Breathing and Patient connected to face mask  Post-op Assessment: Report given to RN and Post -op Vital signs reviewed and stable  Post vital signs: Reviewed and stable  Last Vitals:  Vitals Value Taken Time  BP 155/69 03/28/22 1215  Temp    Pulse 66 03/28/22 1216  Resp 18 03/28/22 1216  SpO2 99 % 03/28/22 1216  Vitals shown include unvalidated device data.  Last Pain:  Vitals:   03/28/22 0833  TempSrc:   PainSc: 5          Complications: No notable events documented.

## 2022-03-28 NOTE — Anesthesia Postprocedure Evaluation (Signed)
Anesthesia Post Note  Patient: Erika Kim  Procedure(s) Performed: REVERSE SHOULDER ARTHROPLASTY (Left: Shoulder)     Patient location during evaluation: PACU Anesthesia Type: Regional and General Level of consciousness: sedated Pain management: pain level controlled Vital Signs Assessment: post-procedure vital signs reviewed and stable Respiratory status: spontaneous breathing and respiratory function stable Cardiovascular status: stable Postop Assessment: no apparent nausea or vomiting Anesthetic complications: no   No notable events documented.  Last Vitals:  Vitals:   03/28/22 1230 03/28/22 1245  BP: (!) 150/57 (!) 145/71  Pulse: 68 69  Resp: 17 16  Temp:    SpO2: 100% 100%    Last Pain:  Vitals:   03/28/22 1245  TempSrc:   PainSc: 0-No pain                 Genean Adamski DANIEL

## 2022-03-28 NOTE — Anesthesia Procedure Notes (Signed)
Anesthesia Regional Block: Interscalene brachial plexus block   Pre-Anesthetic Checklist: , timeout performed,  Correct Patient, Correct Site, Correct Laterality,  Correct Procedure, Correct Position, site marked,  Risks and benefits discussed,  Surgical consent,  Pre-op evaluation,  At surgeon's request and post-op pain management  Laterality: Left  Prep: chloraprep       Needles:  Injection technique: Single-shot  Needle Type: Echogenic Stimulator Needle     Needle Length: 5cm  Needle Gauge: 22     Additional Needles:   Narrative:  Start time: 03/28/2022 8:53 AM End time: 03/28/2022 9:02 AM Injection made incrementally with aspirations every 5 mL.  Performed by: Personally  Anesthesiologist: Duane Boston, MD  Additional Notes: Functioning IV was confirmed and monitors applied.  A 18m 22ga echogenic arrow stimulator was used. Sterile prep and drape,hand hygiene and sterile gloves were used.Ultrasound guidance: relevant anatomy identified, needle position confirmed, local anesthetic spread visualized around nerve(s)., vascular puncture avoided.  Image printed for medical record.  Negative aspiration and negative test dose prior to incremental administration of local anesthetic. The patient tolerated the procedure well.

## 2022-03-28 NOTE — Anesthesia Procedure Notes (Signed)
Procedure Name: Intubation Date/Time: 03/28/2022 10:20 AM Performed by: Claudia Desanctis, CRNA Pre-anesthesia Checklist: Patient identified, Emergency Drugs available, Suction available and Patient being monitored Patient Re-evaluated:Patient Re-evaluated prior to induction Oxygen Delivery Method: Circle system utilized Preoxygenation: Pre-oxygenation with 100% oxygen Induction Type: IV induction Ventilation: Mask ventilation without difficulty Laryngoscope Size: 2 and Miller Grade View: Grade II Tube type: Oral Tube size: 7.0 mm Number of attempts: 1 Airway Equipment and Method: Stylet Placement Confirmation: ETT inserted through vocal cords under direct vision, positive ETCO2 and breath sounds checked- equal and bilateral Secured at: 21 cm Tube secured with: Tape Dental Injury: Teeth and Oropharynx as per pre-operative assessment

## 2022-03-29 DIAGNOSIS — Z7901 Long term (current) use of anticoagulants: Secondary | ICD-10-CM | POA: Diagnosis not present

## 2022-03-29 DIAGNOSIS — Z96653 Presence of artificial knee joint, bilateral: Secondary | ICD-10-CM | POA: Diagnosis not present

## 2022-03-29 DIAGNOSIS — Z79899 Other long term (current) drug therapy: Secondary | ICD-10-CM | POA: Diagnosis not present

## 2022-03-29 DIAGNOSIS — M75102 Unspecified rotator cuff tear or rupture of left shoulder, not specified as traumatic: Secondary | ICD-10-CM | POA: Diagnosis not present

## 2022-03-29 DIAGNOSIS — E119 Type 2 diabetes mellitus without complications: Secondary | ICD-10-CM | POA: Diagnosis not present

## 2022-03-29 DIAGNOSIS — Z7984 Long term (current) use of oral hypoglycemic drugs: Secondary | ICD-10-CM | POA: Diagnosis not present

## 2022-03-29 DIAGNOSIS — I10 Essential (primary) hypertension: Secondary | ICD-10-CM | POA: Diagnosis not present

## 2022-03-29 DIAGNOSIS — I480 Paroxysmal atrial fibrillation: Secondary | ICD-10-CM | POA: Diagnosis not present

## 2022-03-29 LAB — BASIC METABOLIC PANEL
Anion gap: 7 (ref 5–15)
BUN: 10 mg/dL (ref 8–23)
CO2: 25 mmol/L (ref 22–32)
Calcium: 8.8 mg/dL — ABNORMAL LOW (ref 8.9–10.3)
Chloride: 108 mmol/L (ref 98–111)
Creatinine, Ser: 0.58 mg/dL (ref 0.44–1.00)
GFR, Estimated: >60 mL/min (ref >60)
Glucose, Bld: 117 mg/dL — ABNORMAL HIGH (ref 70–99)
Potassium: 4.2 mmol/L (ref 3.5–5.1)
Sodium: 140 mmol/L (ref 135–145)

## 2022-03-29 LAB — CBC
HCT: 36.1 % (ref 36.0–46.0)
Hemoglobin: 12.1 g/dL (ref 12.0–15.0)
MCH: 30.4 pg (ref 26.0–34.0)
MCHC: 33.5 g/dL (ref 30.0–36.0)
MCV: 90.7 fL (ref 80.0–100.0)
Platelets: 132 10*3/uL — ABNORMAL LOW (ref 150–400)
RBC: 3.98 MIL/uL (ref 3.87–5.11)
RDW: 12.6 % (ref 11.5–15.5)
WBC: 6.9 10*3/uL (ref 4.0–10.5)
nRBC: 0 % (ref 0.0–0.2)

## 2022-03-29 MED ORDER — ONDANSETRON HCL 4 MG PO TABS: 4.0000 mg | ORAL_TABLET | Freq: Three times a day (TID) | ORAL | 0 refills | Status: DC | PRN

## 2022-03-29 MED ORDER — OXYCODONE-ACETAMINOPHEN 5-325 MG PO TABS: 1.0000 | ORAL_TABLET | ORAL | 0 refills | Status: DC | PRN

## 2022-03-29 NOTE — Progress Notes (Signed)
Subjective: 1 Day Post-Op s/p Procedure(s): REVERSE SHOULDER ARTHROPLASTY   Patient is alert, oriented. No pain this morning, feels like block is still in place. Voiding on own. Denies chest pain, SOB. No nausea/vomiting. No other complaints.     Objective:  PE: VITALS:   Vitals:   03/28/22 1734 03/28/22 2121 03/29/22 0229 03/29/22 0546  BP: 109/73 (!) 127/51 137/64 (!) 131/54  Pulse: 67 63 93 64  Resp: '15 17 17 18  '$ Temp: 97.7 F (36.5 C) 98.4 F (36.9 C) 98.1 F (36.7 C) 98.4 F (36.9 C)  TempSrc: Oral Oral Oral Oral  SpO2: 97% 96% 95% 97%  Weight:      Height:       General: sitting up in chair, in no acute distress Resp: normal respiratory effort MSK: Sensation intact distally Intact pulses distally Incision: dressing C/D/I  LABS  Results for orders placed or performed during the hospital encounter of 03/28/22 (from the past 24 hour(s))  Glucose, capillary     Status: None   Collection Time: 03/28/22  2:56 PM  Result Value Ref Range   Glucose-Capillary 98 70 - 99 mg/dL  Glucose, capillary     Status: Abnormal   Collection Time: 03/28/22  4:48 PM  Result Value Ref Range   Glucose-Capillary 106 (H) 70 - 99 mg/dL  CBC     Status: Abnormal   Collection Time: 03/29/22  3:31 AM  Result Value Ref Range   WBC 6.9 4.0 - 10.5 K/uL   RBC 3.98 3.87 - 5.11 MIL/uL   Hemoglobin 12.1 12.0 - 15.0 g/dL   HCT 36.1 36.0 - 46.0 %   MCV 90.7 80.0 - 100.0 fL   MCH 30.4 26.0 - 34.0 pg   MCHC 33.5 30.0 - 36.0 g/dL   RDW 12.6 11.5 - 15.5 %   Platelets 132 (L) 150 - 400 K/uL   nRBC 0.0 0.0 - 0.2 %  Basic metabolic panel     Status: Abnormal   Collection Time: 03/29/22  3:31 AM  Result Value Ref Range   Sodium 140 135 - 145 mmol/L   Potassium 4.2 3.5 - 5.1 mmol/L   Chloride 108 98 - 111 mmol/L   CO2 25 22 - 32 mmol/L   Glucose, Bld 117 (H) 70 - 99 mg/dL   BUN 10 8 - 23 mg/dL   Creatinine, Ser 0.58 0.44 - 1.00 mg/dL   Calcium 8.8 (L) 8.9 - 10.3 mg/dL   GFR, Estimated  >60 >60 mL/min   Anion gap 7 5 - 15    DG Shoulder Left Port  Result Date: 03/28/2022 CLINICAL DATA:  Status post left shoulder arthroplasty. EXAM: LEFT SHOULDER COMPARISON:  Mar 14, 2022 FINDINGS: Post total left shoulder arthroplasty with normal alignment the prosthetic components. IMPRESSION: Post total left shoulder arthroplasty with normal alignment of the prosthetic components. Electronically Signed   By: Fidela Salisbury M.D.   On: 03/28/2022 12:45    Assessment/Plan: Principal Problem:   S/P reverse total shoulder arthroplasty, left    1 Day Post-Op s/p Procedure(s): REVERSE SHOULDER ARTHROPLASTY  Weightbearing: NWB LUE, ok for AROM of left wrist, elbow, and fingers. Sling at all times Insicional and dressing care: Dressings left intact until follow-up VTE prophylaxis: She will restart her Xarelto this evening Pain control: hasn't needed narcotics thus far, will send home with oxycodone Follow - up plan: 2 weeks with Dr. Mardelle Matte Dispo: doing well, will plan to discharge home after working with PT this morning  Contact information:   Merlene Pulling, Hershal Coria [id number]  After hours and holidays please check [email or web address] for group call information for [facility]  Ventura Bruns 03/29/2022, 8:46 AM

## 2022-03-29 NOTE — Discharge Instructions (Signed)
Diet: As you were doing prior to hospitalization   Shower/Dressing:  May shower but keep the wounds dry, use an occlusive plastic wrap, NO SOAKING IN TUB.  If the bandage gets wet, change with a clean dry gauze. There are sticky tapes (steri-strips) on your wounds and all the stitches are absorbable.  Leave the steri-strips in place, they will peel off with time, usually 2-3 weeks.  Activity:  Increase activity slowly as tolerated, but follow the weight bearing instructions below.  The rules on driving is that you can not be taking narcotics while you drive, and you must feel in control of the vehicle.    Weight Bearing:  No bearing weight with left arm, continue use of sling.  To prevent constipation: you may use a stool softener such as -  Colace (over the counter) 100 mg by mouth twice a day  Drink plenty of fluids (prune juice may be helpful) and high fiber foods Miralax (over the counter) for constipation as needed.    Itching:  If you experience itching with your medications, try taking only a single pain pill, or even half a pain pill at a time.  You may take up to 10 pain pills per day, and you can also use benadryl over the counter for itching or also to help with sleep.   **Due to national backorder of oxycodone, you have been sent home with Percocet (combination of oxycodone 5 mg and tylenol 325 mg), if you use tylenol with your Percocet please make sure you calculate your total tylenol dosage (including the tylenol within each Percocet) so that you do not exceed 4000 mg of tylenol in a 24 hour period**  Precautions:  If you experience chest pain or shortness of breath - call 911 immediately for transfer to the hospital emergency department!!  If you develop a fever greater that 101 F, purulent drainage from wound, increased redness or drainage from wound, or calf pain -- Call the office at [phone]                                                Follow- Up Appointment:  Please  call for an appointment to be seen in 2 weeks [place] - [phone]

## 2022-03-29 NOTE — Progress Notes (Signed)
Transition of Care [facility]) Screening Note  Patient Details  Name: Erika Kim Date of Birth: [birth year]  Transition of [facility]) CM/SW Contact:    Sherie Don, LCSW Phone Number: 03/29/2022, 9:37 AM  Transition of Care Department [facility]) has reviewed patient and no TOC needs have been identified at this time. We will continue to monitor patient advancement through interdisciplinary progression rounds. If new patient transition needs arise, please place a TOC consult.

## 2022-03-29 NOTE — Discharge Summary (Signed)
Discharge Summary  Patient ID: Erika Kim MRN: [id number] DOB/AGE: 1946-02-07 76 y.o.  Admit date: 03/28/2022 Discharge date: 03/29/2022  Admission Diagnoses:  S/P reverse total shoulder arthroplasty, left  Discharge Diagnoses:  Principal Problem:   S/P reverse total shoulder arthroplasty, left   Past Medical History:  Diagnosis Date   Acoustic neuroma ([place]) followed by dr Jariah Jarmon at [facility]   right side w/ chronic roaring noise   Anemia    hx of 2019 realted to Xarelto    Anticoagulant long-term use    xarelto   Benign paroxysmal positional vertigo    Dysrhythmia    afib- 2 ablations followed by Dr Marlou Porch - [facility] - 4/21    Endometrial polyp    Family history of adverse reaction to anesthesia    daughter- nausea    Frequency of urination    Hx of transfusion of packed red blood cells    related to small intestine in 2019    Hyperlipidemia    Hypertension    OA (osteoarthritis)    left knee   OSA on CPAP    cpap - setting at ? 8    PAF (paroxysmal atrial fibrillation) [facility]) cardiologist-  dr Marlou Porch   dx 04/ 2017   PMB (postmenopausal bleeding)    TMJ (temporomandibular joint disorder)    left side-- wears guard   Type 2 diabetes mellitus ([place])    Wears contact lenses     Surgeries: Procedure(s): REVERSE SHOULDER ARTHROPLASTY on 03/28/2022   Consultants (if any):   Discharged Condition: Improved  Hospital Course: Erika Kim is an 76 y.o. female who was admitted 03/28/2022 with a diagnosis of S/P reverse total shoulder arthroplasty, left and went to the operating room on 03/28/2022 and underwent the above named procedures.    She was given perioperative antibiotics:  Anti-infectives (From admission, onward)    Start     Dose/Rate Route Frequency Ordered Stop   03/28/22 1630  ceFAZolin (ANCEF) IVPB 2g/100 mL premix        2 g 200 mL/hr over 30 Minutes Intravenous Every 6 hours 03/28/22 1535 03/29/22 0602   03/28/22 0830  vancomycin (VANCOCIN) IVPB 1000 mg/200 mL  premix        1,000 mg 200 mL/hr over 60 Minutes Intravenous On call to O.R. 03/28/22 9381 03/28/22 1026     .  She was given sequential compression devices, early ambulation for DVT prophylaxis.  She benefited maximally from the hospital stay and there were no complications.    Recent vital signs:  Vitals:   03/29/22 0229 03/29/22 0546  BP: 137/64 (!) 131/54  Pulse: 93 64  Resp: 17 18  Temp: 98.1 F (36.7 C) 98.4 F (36.9 C)  SpO2: 95% 97%    Recent laboratory studies:  Lab Results  Component Value Date   HGB 12.1 03/29/2022   HGB 13.8 03/17/2022   HGB 14.1 01/31/2021   Lab Results  Component Value Date   WBC 6.9 03/29/2022   PLT 132 (L) 03/29/2022   Lab Results  Component Value Date   INR 1.53 01/10/2017   Lab Results  Component Value Date   NA 140 03/29/2022   K 4.2 03/29/2022   CL 108 03/29/2022   CO2 25 03/29/2022   BUN 10 03/29/2022   CREATININE 0.58 03/29/2022   GLUCOSE 117 (H) 03/29/2022    Discharge Medications:   Allergies as of 03/29/2022       Reactions   Cephalexin Itching   Received  2 grams ancef 03/28/2022 without problem   Augmentin [amoxicillin-pot Clavulanate] Itching   severe   Cephalosporins Itching   severe        Medication List     STOP taking these medications    acetaminophen 650 MG CR tablet Commonly known as: TYLENOL       TAKE these medications    atorvastatin 40 MG tablet Commonly known as: LIPITOR Take 40 mg by mouth every morning.   betamethasone valerate 0.1 % cream Commonly known as: VALISONE Apply 1 application. topically 2 (two) times a week.   CRANBERRY CONCENTRATE/VITAMINC PO Take 1 tablet by mouth daily.   docusate sodium 100 MG capsule Commonly known as: COLACE Take 100 mg by mouth 2 (two) times daily.   hydrochlorothiazide 12.5 MG tablet Commonly known as: HYDRODIURIL Take 12.5 mg by mouth daily.   ipratropium 0.03 % nasal spray Commonly known as: ATROVENT Place 2 sprays into both  nostrils every 12 (twelve) hours as needed for rhinitis.   Januvia 100 MG tablet Generic drug: sitaGLIPtin Take 100 mg by mouth daily after supper.   metoprolol succinate 50 MG 24 hr tablet Commonly known as: TOPROL-XL take 1 tablet by mouth in the morning and at bedtime with or immediately following a meal   multivitamin tablet Take 1 tablet by mouth daily.   olmesartan 20 MG tablet Commonly known as: BENICAR Take 20 mg by mouth daily.   ondansetron 4 MG tablet Commonly known as: Zofran Take 1 tablet (4 mg total) by mouth every 8 (eight) hours as needed for nausea or vomiting.   ONE TOUCH ULTRA TEST test strip Generic drug: glucose blood   OneTouch Delica Plus [id number] Misc   oxyCODONE-acetaminophen 5-325 MG tablet Commonly known as: Percocet Take 1 tablet by mouth every 4 (four) hours as needed for severe pain.   saline Gel Place 1 application into the nose at bedtime.   SYSTANE OP Place 1 drop into both eyes daily as needed (dry eyes).   triamcinolone cream 0.1 % Commonly known as: KENALOG Apply 1 application. topically daily as needed for itching.   Vitamin D3 125 MCG (5000 UT) Tabs Take 5,000 Units by mouth daily.   Xarelto 20 MG Tabs tablet Generic drug: rivaroxaban TAKE ONE TABLET BY MOUTH ONE TIME DAILY  with supper        Diagnostic Studies: CT SHOULDER LEFT WO CONTRAST  Result Date: 03/13/2022 CLINICAL DATA:  Pain, numbness EXAM: CT OF THE UPPER LEFT EXTREMITY WITHOUT CONTRAST TECHNIQUE: Multidetector CT imaging of the upper left extremity was performed according to the standard protocol. RADIATION DOSE REDUCTION: This exam was performed according to the departmental dose-optimization program which includes automated exposure control, adjustment of the mA and/or kV according to patient size and/or use of iterative reconstruction technique. COMPARISON:  None Available. FINDINGS: Bones/Joint/Cartilage There is no evidence of acute fracture. There is  moderate glenohumeral osteoarthritis. Adequate glenoid bone stock with neutral version. There is moderate acromioclavicular joint osteoarthritis. There is ossification along the lower anterior aspect of the axillary pouch which may be intra tendinous. Ligaments Suboptimally assessed by CT. Muscles and Tendons There is severe subscapularis and infraspinatus muscle atrophy. Soft tissues No focal fluid collection. IMPRESSION: Moderate glenohumeral osteoarthritis. Adequate glenoid bone stock with neutral version. Severe subscapularis and infraspinatus muscle atrophy. Moderate AC joint arthropathy. Electronically Signed   By: Maurine Simmering M.D.   On: 03/13/2022 16:24   DG Shoulder Left Port  Result Date: 03/28/2022 CLINICAL DATA:  Status post  left shoulder arthroplasty. EXAM: LEFT SHOULDER COMPARISON:  Mar 14, 2022 FINDINGS: Post total left shoulder arthroplasty with normal alignment the prosthetic components. IMPRESSION: Post total left shoulder arthroplasty with normal alignment of the prosthetic components. Electronically Signed   By: Fidela Salisbury M.D.   On: 03/28/2022 12:45    Disposition: Discharge disposition: 01-Home or Self Care          Follow-up Information     Marchia Bond, MD. Schedule an appointment as soon as possible for a visit in 2 week(s).   Specialty: Orthopedic Surgery Contact information: [address] [place] [address] [phone]                  Signed: Jola Baptist 03/29/2022, 8:56 AM

## 2022-03-29 NOTE — Evaluation (Signed)
Occupational Therapy Evaluation Patient Details Name: Erika Kim MRN: [id number] DOB: 1946-04-17 Today's Date: 03/29/2022   History of Present Illness Patient is a 76 year old female who underwent a left reverse shoulder replacement on 6/6. patient had DJD and OA of L shoulder.   Clinical Impression   s/p shoulder replacement without functional use of left non dominant upper extremity secondary to effects of surgery and interscalene block and shoulder precautions. Therapist provided education and instruction to patient and spouse in regards to exercises, precautions, positioning, donning upper extremity clothing and bathing while maintaining shoulder precautions, ice and edema management and donning/doffing sling. Patient and spouse verbalized understanding and demonstrated as needed. Patient needed assistance to donn shirt, underwear, pants, socks and shoes and provided with instruction on compensatory strategies to perform ADLs. Patient to follow up with MD for further therapy needs.        Recommendations for follow up therapy are one component of a multi-disciplinary discharge planning process, led by the attending physician.  Recommendations may be updated based on patient status, additional functional criteria and insurance authorization.   Follow Up Recommendations  Follow physician's recommendations for discharge plan and follow up therapies    Assistance Recommended at Discharge Frequent or constant Supervision/Assistance  Patient can return home with the following A little help with bathing/dressing/bathroom;A little help with walking and/or transfers;Assistance with cooking/housework;Assist for transportation;Help with stairs or ramp for entrance    Functional Status Assessment  Patient has had a recent decline in their functional status and demonstrates the ability to make significant improvements in function in a reasonable and predictable amount of time.  Equipment  Recommendations  None recommended by OT    Recommendations for Other Services       Precautions / Restrictions Precautions Precautions: Shoulder Type of Shoulder Precautions: NO ROM L shoulder, ok for hand wrist and elbow Shoulder Interventions: Shoulder sling/immobilizer;At all times;Off for dressing/bathing/exercises Precaution Booklet Issued: Yes (comment) (handout) Required Braces or Orthoses: Sling Restrictions Weight Bearing Restrictions: Yes LUE Weight Bearing: Non weight bearing      Mobility Bed Mobility Overal bed mobility: Needs Assistance Bed Mobility: Supine to Sit     Supine to sit: Supervision, HOB elevated     General bed mobility comments: with increased time    Transfers                          Balance Overall balance assessment: Mild deficits observed, not formally tested                                         ADL either performed or assessed with clinical judgement   ADL Overall ADL's : Needs assistance/impaired Eating/Feeding: Modified independent;Sitting   Grooming: Wash/dry face;Set up;Sitting           Upper Body Dressing : Moderate assistance;Sitting Upper Body Dressing Details (indicate cue type and reason): with education on proper UE placement and maintaining precautions. Lower Body Dressing: Minimal assistance;Sit to/from stand Lower Body Dressing Details (indicate cue type and reason): with pullin gup on L side last three inches for pants and undergarments. education provided on not pulling up with LUE as well multiple times during session Toilet Transfer: Supervision/safety;Ambulation;Regular Toilet   Toileting- Clothing Manipulation and Hygiene: Minimal assistance;Sit to/from stand       Functional mobility during ADLs: Supervision/safety  Vision Patient Visual Report: No change from baseline       Perception     Praxis      Pertinent Vitals/Pain Pain Assessment Pain Assessment:  No/denies pain (block still in place)     Hand Dominance Right   Extremity/Trunk Assessment Upper Extremity Assessment Upper Extremity Assessment: LUE deficits/detail LUE Deficits / Details: total reverse shoulder replacement 6/6 with no ROM of shoulder allowed per MD orders   Lower Extremity Assessment Lower Extremity Assessment: Overall WFL for tasks assessed   Cervical / Trunk Assessment Cervical / Trunk Assessment: Normal   Communication Communication Communication: No difficulties   Cognition Arousal/Alertness: Awake/alert Behavior During Therapy: WFL for tasks assessed/performed Overall Cognitive Status: Within Functional Limits for tasks assessed                                 General Comments: husband was present at end of session as well.     General Comments       Exercises     Shoulder Instructions Shoulder Instructions Donning/doffing shirt without moving shoulder: Moderate assistance;Caregiver independent with task Method for sponge bathing under operated UE: Caregiver independent with task;Patient able to independently direct caregiver Donning/doffing sling/immobilizer: Supervision/safety;Caregiver independent with task Correct positioning of sling/immobilizer: Caregiver independent with task;Modified independent ROM for elbow, wrist and digits of operated UE: Modified independent (on RUE) Sling wearing schedule (on at all times/off for ADL's): Modified independent Proper positioning of operated UE when showering: Supervision/safety Positioning of UE while sleeping: [place] expects to be discharged to:: Private residence Living Arrangements: Spouse/significant other Available Help at Discharge: Family;Available 24 hours/day                                    Prior Functioning/Environment Prior Level of Function : Independent/Modified Independent                        OT  Problem List:        OT Treatment/Interventions: Self-care/ADL training;Patient/family education;Therapeutic activities    OT Goals(Current goals can be found in the care plan section) Acute Rehab OT Goals OT Goal Formulation: All assessment and education complete, DC therapy  OT Frequency:      Co-evaluation              AM-PAC OT "6 Clicks" Daily Activity     Outcome Measure Help from another person eating meals?: A Little Help from another person taking care of personal grooming?: A Little Help from another person toileting, which includes using toliet, bedpan, or urinal?: A Little Help from another person bathing (including washing, rinsing, drying)?: A Little Help from another person to put on and taking off regular upper body clothing?: A Little Help from another person to put on and taking off regular lower body clothing?: A Little 6 Click Score: 18   End of Session Nurse Communication: Mobility status  Activity Tolerance: Patient tolerated treatment well Patient left: in bed;with call bell/phone within reach;with family/visitor present  OT Visit Diagnosis: Unsteadiness on feet (R26.81)                Time: 9528-4132 OT Time Calculation (min): 42 min Charges:  OT General Charges $OT Visit: 1 Visit OT Evaluation $OT Eval Low Complexity: 1 Low OT Treatments $Self Care/Home Management :  23-37 mins  Jackelyn Poling OTR/L, [place] Acute Rehabilitation Department Office# [phone] Pager# [phone]   Marcellina Millin 03/29/2022, 8:49 AM

## 2022-03-30 ENCOUNTER — Encounter (HOSPITAL_COMMUNITY): Payer: Self-pay | Admitting: Orthopedic Surgery

## 2022-04-01 DIAGNOSIS — G4733 Obstructive sleep apnea (adult) (pediatric): Secondary | ICD-10-CM | POA: Diagnosis not present

## 2022-04-10 DIAGNOSIS — M25512 Pain in left shoulder: Secondary | ICD-10-CM | POA: Diagnosis not present

## 2022-04-12 DIAGNOSIS — G4733 Obstructive sleep apnea (adult) (pediatric): Secondary | ICD-10-CM | POA: Diagnosis not present

## 2022-05-01 ENCOUNTER — Encounter (HOSPITAL_BASED_OUTPATIENT_CLINIC_OR_DEPARTMENT_OTHER): Payer: Self-pay | Admitting: Obstetrics and Gynecology

## 2022-05-01 ENCOUNTER — Emergency Department (HOSPITAL_BASED_OUTPATIENT_CLINIC_OR_DEPARTMENT_OTHER)
Admission: EM | Admit: 2022-05-01 | Discharge: 2022-05-01 | Disposition: A | Discharge status: Home / Self Care | Payer: Medicare HMO | Attending: Emergency Medicine | Admitting: Emergency Medicine

## 2022-05-01 ENCOUNTER — Emergency Department (HOSPITAL_BASED_OUTPATIENT_CLINIC_OR_DEPARTMENT_OTHER): Payer: Medicare HMO

## 2022-05-01 ENCOUNTER — Other Ambulatory Visit: Payer: Self-pay

## 2022-05-01 DIAGNOSIS — S0083XA Contusion of other part of head, initial encounter: Secondary | ICD-10-CM | POA: Insufficient documentation

## 2022-05-01 DIAGNOSIS — Z96612 Presence of left artificial shoulder joint: Secondary | ICD-10-CM | POA: Diagnosis not present

## 2022-05-01 DIAGNOSIS — W010XXA Fall on same level from slipping, tripping and stumbling without subsequent striking against object, initial encounter: Secondary | ICD-10-CM | POA: Insufficient documentation

## 2022-05-01 DIAGNOSIS — Z7901 Long term (current) use of anticoagulants: Secondary | ICD-10-CM | POA: Diagnosis not present

## 2022-05-01 DIAGNOSIS — Y9222 Religious institution as the place of occurrence of the external cause: Secondary | ICD-10-CM | POA: Diagnosis not present

## 2022-05-01 DIAGNOSIS — R220 Localized swelling, mass and lump, head: Secondary | ICD-10-CM | POA: Diagnosis not present

## 2022-05-01 DIAGNOSIS — W19XXXA Unspecified fall, initial encounter: Secondary | ICD-10-CM

## 2022-05-01 DIAGNOSIS — S0990XA Unspecified injury of head, initial encounter: Secondary | ICD-10-CM | POA: Diagnosis not present

## 2022-05-01 DIAGNOSIS — S0993XA Unspecified injury of face, initial encounter: Secondary | ICD-10-CM | POA: Diagnosis not present

## 2022-05-01 DIAGNOSIS — G4733 Obstructive sleep apnea (adult) (pediatric): Secondary | ICD-10-CM | POA: Diagnosis not present

## 2022-05-01 NOTE — Discharge Instructions (Signed)
Please follow up with your PCP for further evaluation. You can take Tylenol for pain.  Return to the emergency department for any worsening symptoms.

## 2022-05-01 NOTE — ED Provider Notes (Signed)
[facility] Provider Note   CSN: [id number] Arrival date & time: 05/01/22  1246     History Chief Complaint  Patient presents with   Carter Kitten is a 76 y.o. female patient who presents to the emergency department with blunt facial injury after mechanical trip and fall falling forward onto the left side of her face just prior to arrival.  Patient is anticoagulated with Xarelto secondary to atrial fibrillation.  Patient denies any loss of consciousness, dizziness, nausea, vomiting, focal weakness or numbness to the upper or lower extremities.  Of note patient had a reverse left shoulder replacement back in June and is currently in a sling. Denies any new or worsening shoulder pain currently.    Fall       Home Medications Prior to Admission medications   Medication Sig Start Date End Date Taking? Authorizing Provider  atorvastatin (LIPITOR) 40 MG tablet Take 40 mg by mouth every morning.  08/07/13   [provider]  betamethasone valerate (VALISONE) 0.1 % cream Apply 1 application. topically 2 (two) times a week.    [provider]  Cholecalciferol (VITAMIN D3) 5000 units TABS Take 5,000 Units by mouth daily.     [provider]  Cranberry-Vitamin C (CRANBERRY CONCENTRATE/VITAMINC PO) Take 1 tablet by mouth daily.     [provider]  docusate sodium (COLACE) 100 MG capsule Take 100 mg by mouth 2 (two) times daily.    [provider]  hydrochlorothiazide (HYDRODIURIL) 12.5 MG tablet Take 12.5 mg by mouth daily.     [provider]  ipratropium (ATROVENT) 0.03 % nasal spray Place 2 sprays into both nostrils every 12 (twelve) hours as needed for rhinitis.    [provider]  JANUVIA 100 MG tablet Take 100 mg by mouth daily after supper.  04/01/14   [provider]  Lancets (ONETOUCH DELICA PLUS [id number]) [place]  01/08/19   [provider]  metoprolol succinate  (TOPROL-XL) 50 MG 24 hr tablet take 1 tablet by mouth in the morning and at bedtime with or immediately following a meal 07/18/21   Camnitz, Ocie Doyne, MD  Multiple Vitamin (MULTIVITAMIN) tablet Take 1 tablet by mouth daily.    [provider]  olmesartan (BENICAR) 20 MG tablet Take 20 mg by mouth daily. 08/28/19   [provider]  ondansetron (ZOFRAN) 4 MG tablet Take 1 tablet (4 mg total) by mouth every 8 (eight) hours as needed for nausea or vomiting. 03/29/22   Ventura Bruns, PA-C  ONE TOUCH ULTRA TEST test strip  01/08/19   [provider]  oxyCODONE-acetaminophen (PERCOCET) 5-325 MG tablet Take 1 tablet by mouth every 4 (four) hours as needed for severe pain. 03/29/22 03/29/23  Merlene Pulling K, PA-C  Polyethyl Glycol-Propyl Glycol (SYSTANE OP) Place 1 drop into both eyes daily as needed (dry eyes).    [provider]  rivaroxaban (XARELTO) 20 MG TABS tablet TAKE ONE TABLET BY MOUTH ONE TIME DAILY  with supper 06/29/21   Camnitz, Ocie Doyne, MD  saline (AYR) GEL Place 1 application into the nose at bedtime.    [provider]  triamcinolone cream (KENALOG) 0.1 % Apply 1 application. topically daily as needed for itching.    [provider]      Allergies    Cephalexin, Augmentin [amoxicillin-pot clavulanate], and Cephalosporins    Review of Systems   Review of Systems  All other systems reviewed and are negative.  Physical Exam Updated Vital Signs BP (!) 160/61 (BP Location: Right Arm)   Pulse 72   Temp 97.8 F (36.6 C)   Resp 16   Ht '5\' 3"'$  (1.6 m)   Wt 95.7 kg   SpO2 100%   BMI 37.38 kg/m  Physical Exam Vitals and nursing note reviewed.  Constitutional:      Appearance: Normal appearance.  HENT:     Head: Normocephalic. No raccoon eyes or Battle's sign.   Eyes:     General:        Right eye: No discharge.        Left eye: No discharge.     Conjunctiva/sclera: Conjunctivae normal.  Pulmonary:     Effort: Pulmonary  effort is normal.  Musculoskeletal:     Comments: Left upper extremity in a sling.  Skin:    General: Skin is warm and dry.     Findings: No rash.  Neurological:     General: No focal deficit present.     Mental Status: She is alert.  Psychiatric:        Mood and Affect: Mood normal.        Behavior: Behavior normal.     ED Results / Procedures / Treatments   Labs (all labs ordered are listed, but only abnormal results are displayed) Labs Reviewed - No data to display  EKG None  Radiology CT Maxillofacial Wo Contrast  Result Date: 05/01/2022 CLINICAL DATA:  [place] forward with trauma to the head and face EXAM: CT HEAD WITHOUT CONTRAST CT MAXILLOFACIAL WITHOUT CONTRAST TECHNIQUE: Multidetector CT imaging of the head and maxillofacial structures were performed using the standard protocol without intravenous contrast. Multiplanar CT image reconstructions of the maxillofacial structures were also generated. RADIATION DOSE REDUCTION: This exam was performed according to the departmental dose-optimization program which includes automated exposure control, adjustment of the mA and/or kV according to patient size and/or use of iterative reconstruction technique. COMPARISON:  None Available. FINDINGS: CT HEAD FINDINGS Brain: The brain shows a normal appearance without evidence of malformation, atrophy, old or acute small or large vessel infarction, mass lesion, hemorrhage, hydrocephalus or extra-axial collection. Vascular: No hyperdense vessel. No evidence of atherosclerotic calcification. Skull: Normal.  No traumatic finding.  No focal bone lesion. Sinuses/Orbits: Sinuses are clear. Orbits appear normal. Mastoids are clear. Other: None significant CT MAXILLOFACIAL FINDINGS Osseous: No evidence of regional fracture. Orbits: Globes, optic nerves, orbital fat, extraocular muscles and lacrimal glands are normal. Sinuses: Clear except for an insignificant retention cyst at the inferior right maxillary  sinus. Large nasoantral windows. Soft tissues: Mild soft tissue swelling of the left cheek. IMPRESSION: Head CT: Normal for age.  No traumatic finding. Maxillofacial CT: No facial fracture. Soft tissue swelling of the left cheek. No evidence of intraorbital injury. Electronically Signed   By: Nelson Chimes M.D.   On: 05/01/2022 13:55   CT Head Wo Contrast  Result Date: 05/01/2022 CLINICAL DATA:  [place] forward with trauma to the head and face EXAM: CT HEAD WITHOUT CONTRAST CT MAXILLOFACIAL WITHOUT CONTRAST TECHNIQUE: Multidetector CT imaging of the head and maxillofacial structures were performed using the standard protocol without intravenous contrast. Multiplanar CT image reconstructions of the maxillofacial structures were also generated. RADIATION DOSE REDUCTION: This exam was performed according to the departmental dose-optimization program which includes automated exposure control, adjustment of the mA and/or kV according to patient size and/or use of iterative reconstruction technique. COMPARISON:  None Available. FINDINGS: CT HEAD FINDINGS Brain: The brain shows a  normal appearance without evidence of malformation, atrophy, old or acute small or large vessel infarction, mass lesion, hemorrhage, hydrocephalus or extra-axial collection. Vascular: No hyperdense vessel. No evidence of atherosclerotic calcification. Skull: Normal.  No traumatic finding.  No focal bone lesion. Sinuses/Orbits: Sinuses are clear. Orbits appear normal. Mastoids are clear. Other: None significant CT MAXILLOFACIAL FINDINGS Osseous: No evidence of regional fracture. Orbits: Globes, optic nerves, orbital fat, extraocular muscles and lacrimal glands are normal. Sinuses: Clear except for an insignificant retention cyst at the inferior right maxillary sinus. Large nasoantral windows. Soft tissues: Mild soft tissue swelling of the left cheek. IMPRESSION: Head CT: Normal for age.  No traumatic finding. Maxillofacial CT: No facial fracture.  Soft tissue swelling of the left cheek. No evidence of intraorbital injury. Electronically Signed   By: Nelson Chimes M.D.   On: 05/01/2022 13:55    Procedures Procedures    Medications Ordered in ED Medications - No data to display  ED Course/ Medical Decision Making/ A&P                           Medical Decision Making CARISHA KANTOR is a 76 y.o. female patient who presents to the emergency department for further evaluation of blunt trauma to the face after falling forward secondary to mechanical trip and fall.  Will evaluate with CT imaging of the face and head secondary to anticoagulation and mechanism.  I personally ordered and interpreted these images and do not see any evidence of orbital fractures or intracranial hemorrhage.  Notified patient of all laboratory findings.  She will take Tylenol for pain control.  She will follow-up with her primary care doctor.  Strict return precaution discussed.  She is safe for discharge at this time.   Amount and/or Complexity of Data Reviewed Radiology: ordered.    Final Clinical Impression(s) / ED Diagnoses Final diagnoses:  Fall, initial encounter  Contusion of face, initial encounter    Rx / DC Orders ED Discharge Orders     None         Hendricks Limes, [place] 05/01/22 1437    Lacretia Leigh, MD 05/01/22 1441

## 2022-05-01 NOTE — ED Triage Notes (Signed)
Patient reports to the ER for a fall on her face. Patient reports she tripped and fell forward picking something up at [employer]. Patient reports she is on xarelto. Patient reports minimal headache. States she has pain below her face. Denies LOC.

## 2022-05-08 DIAGNOSIS — M25512 Pain in left shoulder: Secondary | ICD-10-CM | POA: Diagnosis not present

## 2022-05-11 DIAGNOSIS — S43422D Sprain of left rotator cuff capsule, subsequent encounter: Secondary | ICD-10-CM | POA: Diagnosis not present

## 2022-05-11 DIAGNOSIS — Z96612 Presence of left artificial shoulder joint: Secondary | ICD-10-CM | POA: Diagnosis not present

## 2022-05-12 DIAGNOSIS — G4733 Obstructive sleep apnea (adult) (pediatric): Secondary | ICD-10-CM | POA: Diagnosis not present

## 2022-05-16 DIAGNOSIS — Z96612 Presence of left artificial shoulder joint: Secondary | ICD-10-CM | POA: Diagnosis not present

## 2022-05-16 DIAGNOSIS — S43422D Sprain of left rotator cuff capsule, subsequent encounter: Secondary | ICD-10-CM | POA: Diagnosis not present

## 2022-05-22 DIAGNOSIS — Z96612 Presence of left artificial shoulder joint: Secondary | ICD-10-CM | POA: Diagnosis not present

## 2022-05-22 DIAGNOSIS — S43422D Sprain of left rotator cuff capsule, subsequent encounter: Secondary | ICD-10-CM | POA: Diagnosis not present

## 2022-05-29 DIAGNOSIS — S43422D Sprain of left rotator cuff capsule, subsequent encounter: Secondary | ICD-10-CM | POA: Diagnosis not present

## 2022-05-29 DIAGNOSIS — Z96612 Presence of left artificial shoulder joint: Secondary | ICD-10-CM | POA: Diagnosis not present

## 2022-06-01 DIAGNOSIS — G4733 Obstructive sleep apnea (adult) (pediatric): Secondary | ICD-10-CM | POA: Diagnosis not present

## 2022-06-05 DIAGNOSIS — S43422D Sprain of left rotator cuff capsule, subsequent encounter: Secondary | ICD-10-CM | POA: Diagnosis not present

## 2022-06-07 DIAGNOSIS — Z96612 Presence of left artificial shoulder joint: Secondary | ICD-10-CM | POA: Diagnosis not present

## 2022-06-07 DIAGNOSIS — S43422D Sprain of left rotator cuff capsule, subsequent encounter: Secondary | ICD-10-CM | POA: Diagnosis not present

## 2022-06-09 DIAGNOSIS — L570 Actinic keratosis: Secondary | ICD-10-CM | POA: Diagnosis not present

## 2022-06-09 DIAGNOSIS — R233 Spontaneous ecchymoses: Secondary | ICD-10-CM | POA: Diagnosis not present

## 2022-06-09 DIAGNOSIS — D225 Melanocytic nevi of trunk: Secondary | ICD-10-CM | POA: Diagnosis not present

## 2022-06-09 DIAGNOSIS — L821 Other seborrheic keratosis: Secondary | ICD-10-CM | POA: Diagnosis not present

## 2022-06-09 DIAGNOSIS — L814 Other melanin hyperpigmentation: Secondary | ICD-10-CM | POA: Diagnosis not present

## 2022-06-12 DIAGNOSIS — G4733 Obstructive sleep apnea (adult) (pediatric): Secondary | ICD-10-CM | POA: Diagnosis not present

## 2022-06-12 DIAGNOSIS — M10072 Idiopathic gout, left ankle and foot: Secondary | ICD-10-CM | POA: Diagnosis not present

## 2022-06-12 DIAGNOSIS — E1151 Type 2 diabetes mellitus with diabetic peripheral angiopathy without gangrene: Secondary | ICD-10-CM | POA: Diagnosis not present

## 2022-06-12 DIAGNOSIS — E785 Hyperlipidemia, unspecified: Secondary | ICD-10-CM | POA: Diagnosis not present

## 2022-06-14 DIAGNOSIS — Z96612 Presence of left artificial shoulder joint: Secondary | ICD-10-CM | POA: Diagnosis not present

## 2022-06-14 DIAGNOSIS — S43422D Sprain of left rotator cuff capsule, subsequent encounter: Secondary | ICD-10-CM | POA: Diagnosis not present

## 2022-06-19 DIAGNOSIS — Z96612 Presence of left artificial shoulder joint: Secondary | ICD-10-CM | POA: Diagnosis not present

## 2022-06-19 DIAGNOSIS — S43422D Sprain of left rotator cuff capsule, subsequent encounter: Secondary | ICD-10-CM | POA: Diagnosis not present

## 2022-06-28 DIAGNOSIS — S43422D Sprain of left rotator cuff capsule, subsequent encounter: Secondary | ICD-10-CM | POA: Diagnosis not present

## 2022-06-28 DIAGNOSIS — Z96612 Presence of left artificial shoulder joint: Secondary | ICD-10-CM | POA: Diagnosis not present

## 2022-07-02 DIAGNOSIS — G4733 Obstructive sleep apnea (adult) (pediatric): Secondary | ICD-10-CM | POA: Diagnosis not present

## 2022-07-03 DIAGNOSIS — Z96612 Presence of left artificial shoulder joint: Secondary | ICD-10-CM | POA: Diagnosis not present

## 2022-07-03 DIAGNOSIS — S43422D Sprain of left rotator cuff capsule, subsequent encounter: Secondary | ICD-10-CM | POA: Diagnosis not present

## 2022-07-08 ENCOUNTER — Other Ambulatory Visit: Payer: Self-pay | Admitting: Cardiology

## 2022-07-10 DIAGNOSIS — Z96612 Presence of left artificial shoulder joint: Secondary | ICD-10-CM | POA: Diagnosis not present

## 2022-07-10 DIAGNOSIS — S43422D Sprain of left rotator cuff capsule, subsequent encounter: Secondary | ICD-10-CM | POA: Diagnosis not present

## 2022-07-10 NOTE — Telephone Encounter (Signed)
Prescription refill request for Xarelto received.  Indication:Afib Last office visit:4/23 Weight:95.7 kg Age:76 Scr:0.5 CrCl:149.13  ml/min  Prescription refilled

## 2022-07-11 DIAGNOSIS — G4733 Obstructive sleep apnea (adult) (pediatric): Secondary | ICD-10-CM | POA: Diagnosis not present

## 2022-08-01 DIAGNOSIS — G4733 Obstructive sleep apnea (adult) (pediatric): Secondary | ICD-10-CM | POA: Diagnosis not present

## 2022-08-06 NOTE — Progress Notes (Unsigned)
Cardiology Office Note Date:  08/06/2022  Patient ID:  Seven, [place] 01/11/1946, MRN [id number] PCP:  [name], MD  Cardiologist:  Dr. Marlou Porch Electrophysiologist: Dr. Curt Bears  ***refresh   Chief Complaint: *** 6 mo  History of Present Illness: Erika Kim is a 76 y.o. female with history of HTN, HLD, DM, OSA (w/CPAP), Acoustic neuroma w/deafness on R, AFib  She saw Dr. Marlou Porch Nov 2021, noted some detections of AFib via her wearable tech, feeling well.  No changes were made.  She sa Dr. Curt Bears April 2023, c/w some brief palpitations, bu t improved and happy with her rhythm control. No changes were made.  *** synmptoms, burden *** xarelto, dose, labs, bleeding   Afib/AAD hx Diagnosed April 2017 Flecainide started 2019 PVI ablation 05/29/2019 AFlutter noted Aug 2020 PVI/CTI ablation 10/29/2019 Flecainide stopped April 2021   Past Medical History:  Diagnosis Date   Acoustic neuroma [facility]) followed by dr brown at [facility]   right side w/ chronic roaring noise   Anemia    hx of 2019 realted to Xarelto    Anticoagulant long-term use    xarelto   Benign paroxysmal positional vertigo    Dysrhythmia    afib- 2 ablations followed by Dr Marlou Porch - [facility] - 4/21    Endometrial polyp    Family history of adverse reaction to anesthesia    daughter- nausea    Frequency of urination    Hx of transfusion of packed red blood cells    related to small intestine in 2019    Hyperlipidemia    Hypertension    OA (osteoarthritis)    left knee   OSA on CPAP    cpap - setting at ? 8    PAF (paroxysmal atrial fibrillation) [facility]) cardiologist-  dr Marlou Porch   dx 04/ 2017   PMB (postmenopausal bleeding)    TMJ (temporomandibular joint disorder)    left side-- wears guard   Type 2 diabetes mellitus ([place])    Wears contact lenses     Past Surgical History:  Procedure Laterality Date   ATRIAL FIBRILLATION ABLATION N/A 05/29/2019   Procedure: ATRIAL FIBRILLATION ABLATION;   Surgeon: [name], MD;  Location: [facility] CV LAB;  Service: Cardiovascular;  Laterality: N/A;   ATRIAL FIBRILLATION ABLATION N/A 10/29/2019   Procedure: ATRIAL FIBRILLATION ABLATION;  Surgeon: [name], MD;  Location: [place] [facility];  Service: Cardiovascular;  Laterality: N/A;   CARDIOVERSION N/A 06/24/2019   Procedure: CARDIOVERSION;  Surgeon: Skeet Latch, MD;  Location: [facility];  Service: Cardiovascular;  Laterality: N/A;   CARPAL TUNNEL RELEASE Right 04/17/2007   w/ Excision ganglion cyst and Pulley Release right thumb   DILATATION & CURETTAGE/HYSTEROSCOPY WITH MYOSURE N/A 01/12/2017   Procedure: DILATATION & CURETTAGE/HYSTEROSCOPY WITH MYOSURE;  Surgeon: Arvella Nigh, MD;  Location: [facility];  Service: Gynecology;  Laterality: N/A;   ESOPHAGOGASTRODUODENOSCOPY (EGD) WITH PROPOFOL N/A 09/16/2018   Procedure: ESOPHAGOGASTRODUODENOSCOPY (EGD) WITH PROPOFOL;  Surgeon: Ronald Lobo, MD;  Location: [facility];  Service: Endoscopy;  Laterality: N/A;   GIVENS CAPSULE STUDY N/A 09/16/2018   Procedure: GIVENS CAPSULE STUDY;  Surgeon: Ronald Lobo, MD;  Location: [facility];  Service: Endoscopy;  Laterality: N/A;   HAMMER TOE SURGERY Left 2010   left second toe   IR RADIOLOGY PERIPHERAL GUIDED IV START  10/27/2019   IR US GUIDE VASC ACCESS RIGHT  10/27/2019   KNEE ARTHROSCOPY Right 2010   KNEE SURGERY  Left 1993   LYMPH NODE BIOPSY N/A 03/01/2017   Procedure: SENTINEL LYMPH NODE BIOPSY;  Surgeon: Everitt Amber, MD;  Location: [facility];  Service: Gynecology;  Laterality: N/A;   NASAL SINUS SURGERY  1985 and 1995   REVERSE SHOULDER ARTHROPLASTY Left 03/28/2022   Procedure: REVERSE SHOULDER ARTHROPLASTY;  Surgeon: Marchia Bond, MD;  Location: [facility];  Service: Orthopedics;  Laterality: Left;   ROBOTIC ASSISTED TOTAL HYSTERECTOMY WITH BILATERAL SALPINGO OOPHERECTOMY Bilateral 03/01/2017   Procedure: XI ROBOTIC ASSISTED TOTAL HYSTERECTOMY  WITH BILATERAL SALPINGO OOPHORECTOMY;  Surgeon: Everitt Amber, MD;  Location: [facility];  Service: Gynecology;  Laterality: Bilateral;   ROTATOR CUFF REPAIR Right 04/2004   TOE SURGERY Right 2012   Great toe and second toe   TOTAL ELBOW REPLACEMENT Right 2000   TOTAL KNEE ARTHROPLASTY Right 01/12/2010   TOTAL KNEE ARTHROPLASTY Left 03/02/2020   Procedure: TOTAL KNEE ARTHROPLASTY;  Surgeon: Renette Butters, MD;  Location: [facility];  Service: Orthopedics;  Laterality: Left;   TRANSTHORACIC ECHOCARDIOGRAM  03/28/2016   ef 60-65%/ mild AR and MR   TUBAL LIGATION Bilateral 1978   ULNAR NERVE TRANSPOSITION Right 04/21/2021   Procedure: RIGHT ULNAR NERVE DECOMPRESSION ELBOW;  Surgeon: Leanora Cover, MD;  Location: [facility];  Service: Orthopedics;  Laterality: Right;  axillary block    Current Outpatient Medications  Medication Sig Dispense Refill   atorvastatin (LIPITOR) 40 MG tablet Take 40 mg by mouth every morning.      betamethasone valerate (VALISONE) 0.1 % cream Apply 1 application. topically 2 (two) times a week.     Cholecalciferol (VITAMIN D3) 5000 units TABS Take 5,000 Units by mouth daily.      Cranberry-Vitamin C (CRANBERRY CONCENTRATE/VITAMINC PO) Take 1 tablet by mouth daily.      docusate sodium (COLACE) 100 MG capsule Take 100 mg by mouth 2 (two) times daily.     hydrochlorothiazide (HYDRODIURIL) 12.5 MG tablet Take 12.5 mg by mouth daily.      ipratropium (ATROVENT) 0.03 % nasal spray Place 2 sprays into both nostrils every 12 (twelve) hours as needed for rhinitis.     JANUVIA 100 MG tablet Take 100 mg by mouth daily after supper.      Lancets (ONETOUCH DELICA PLUS [id number]) MISC      metoprolol succinate (TOPROL-XL) 50 MG 24 hr tablet take 1 tablet by mouth in the morning and at bedtime with or immediately following a meal 180 tablet 2   Multiple Vitamin (MULTIVITAMIN) tablet Take 1 tablet by mouth daily.     olmesartan (BENICAR) 20 MG tablet Take 20 mg by mouth  daily.     ondansetron (ZOFRAN) 4 MG tablet Take 1 tablet (4 mg total) by mouth every 8 (eight) hours as needed for nausea or vomiting. 10 tablet 0   ONE TOUCH ULTRA TEST test strip      oxyCODONE-acetaminophen (PERCOCET) 5-325 MG tablet Take 1 tablet by mouth every 4 (four) hours as needed for severe pain. 30 tablet 0   Polyethyl Glycol-Propyl Glycol (SYSTANE OP) Place 1 drop into both eyes daily as needed (dry eyes).     rivaroxaban (XARELTO) 20 MG TABS tablet TAKE ONE TABLET BY MOUTH ONE TIME DAILY WITH SUPPER 90 tablet 1   saline (AYR) GEL Place 1 application into the nose at bedtime.     triamcinolone cream (KENALOG) 0.1 % Apply 1 application. topically daily as needed for itching.     No current facility-administered medications for this visit.  Allergies:   Cephalexin, Augmentin [amoxicillin-pot clavulanate], and Cephalosporins   Social History:  The patient  reports that she has never smoked. She has never been exposed to tobacco smoke. She has never used smokeless tobacco. She reports that she does not drink alcohol and does not use drugs.   Family History:  The patient's family history includes Heart failure in her father and mother; Sleep apnea in her brother.***  ROS:  Please see the history of present illness.    All other systems are reviewed and otherwise negative.   PHYSICAL EXAM:  VS:  There were no vitals taken for this visit. BMI: There is no height or weight on file to calculate BMI. Well nourished, well developed, in no acute distress HEENT: normocephalic, atraumatic Neck: no JVD, carotid bruits or masses Cardiac:  *** RRR; no significant murmurs, no rubs, or gallops Lungs:  *** CTA b/l, no wheezing, rhonchi or rales Abd: soft, nontender MS: no deformity or *** atrophy Ext: *** no edema Skin: warm and dry, no rash Neuro:  No gross deficits appreciated Psych: euthymic mood, full affect    EKG:  Done today and reviewed by myself shows  ***   10/29/2019:  EPS/ablation CONCLUSIONS: 1. Sinus rhythm upon presentation.   2. Successful electrical isolation and anatomical encircling of all four pulmonary veins with radiofrequency current.    3. Cavo-tricuspid isthmus ablation was performed with complete bidirectional isthmus block achieved.  4. No inducible arrhythmias following ablation both on and off of dobutamine 5. No early apparent complications.  05/29/2019: EPS/ablation CONCLUSIONS: 1. Sinus rhythm upon presentation.   2. Successful electrical isolation and anatomical encircling of all four pulmonary veins with radiofrequency current. 3. No inducible arrhythmias following ablation both on and off of dobutamine 4. No early apparent complications   01/26/2702: ETT Blood pressure demonstrated a normal response to exercise. There was no ST segment deviation noted during stress.   Negative ETT but patient only achieved 78% of PMHR at 118 bpm Normal hemodynamic response Rare PVCls with stress    03/28/2016: TTE Study Conclusions  - Left ventricle: The cavity size was normal. Systolic function was    normal. The estimated ejection fraction was in the range of 60%    to 65%. Wall motion was normal; there were no regional wall    motion abnormalities. The transmitral flow pattern was normal.    Left ventricular diastolic function parameters were normal.  - Aortic valve: Moderate focal calcification, consistent with    sclerosis. There was mild regurgitation.  - Mitral valve: There was mild regurgitation.   Recent Labs: 03/29/2022: BUN 10; Creatinine, Ser 0.58; Hemoglobin 12.1; Platelets 132; Potassium 4.2; Sodium 140  No results found for requested labs within last 365 days.   CrCl cannot be calculated (Patient's most recent lab result is older than the maximum 21 days allowed.).   Wt Readings from Last 3 Encounters:  05/01/22 211 lb (95.7 kg)  03/28/22 210 lb 15.7 oz (95.7 kg)  03/17/22 211 lb (95.7 kg)     Other studies  reviewed: Additional studies/records reviewed today include: summarized above  ASSESSMENT AND PLAN:  Paroxysmal AFib Typical Aflutter CHA2DS2Vasc is 5, on *** xarelto appropriately dosed *** burden by symptoms  HTN ***  OSA *** compliant with her CPAP  Disposition: F/u with ***  Current medicines are reviewed at length with the patient today.  The patient did not have any concerns regarding medicines.  Venetia Night, PA-C 08/06/2022 8:46 AM  [facility]  [place] [address] [phone] (office)  [phone] (fax)

## 2022-08-07 ENCOUNTER — Ambulatory Visit: Payer: Medicare HMO | Attending: Physician Assistant | Admitting: Physician Assistant

## 2022-08-07 ENCOUNTER — Encounter: Payer: Self-pay | Admitting: Physician Assistant

## 2022-08-07 VITALS — BP 118/60 | HR 62 | Ht 63.0 in | Wt 212.6 lb

## 2022-08-07 DIAGNOSIS — I10 Essential (primary) hypertension: Secondary | ICD-10-CM

## 2022-08-07 DIAGNOSIS — S43422D Sprain of left rotator cuff capsule, subsequent encounter: Secondary | ICD-10-CM | POA: Diagnosis not present

## 2022-08-07 DIAGNOSIS — I483 Typical atrial flutter: Secondary | ICD-10-CM

## 2022-08-07 DIAGNOSIS — I480 Paroxysmal atrial fibrillation: Secondary | ICD-10-CM | POA: Diagnosis not present

## 2022-08-07 NOTE — Patient Instructions (Addendum)
Medication Instructions:   Your physician recommends that you continue on your current medications as directed. Please refer to the Current Medication list given to you today.  *If you need a refill on your cardiac medications before your next appointment, please call your pharmacy*   Lab Work: [place]   If you have labs (blood work) drawn today and your tests are completely normal, you will receive your results only by: [place] (if you have MyChart) OR A paper copy in the mail If you have any lab test that is abnormal or we need to change your treatment, we will call you to review the results.   Testing/Procedures: NONE ORDERED  TODAY     Follow-Up: At [facility], you and your health needs are our priority.  As part of our continuing mission to provide you with exceptional heart care, we have created designated Provider Care Teams.  These Care Teams include your primary Cardiologist (physician) and Advanced Practice Providers (APPs -  Physician Assistants and Nurse Practitioners) who all work together to provide you with the care you need, when you need it.  We recommend signing up for the patient portal called "MyChart".  Sign up information is provided on this After Visit Summary.  MyChart is used to connect with patients for Virtual Visits (Telemedicine).  Patients are able to view lab/test results, encounter notes, upcoming appointments, etc.  Non-urgent messages can be sent to your provider as well.   To learn more about what you can do with MyChart, go to [email or web address].    Your next appointment:   6 month(s)  The format for your next appointment:   In Person  Provider:   Tommye Standard, PA-C / Curt Bears, [place]   Other Instructions   Important Information About Sugar

## 2022-08-08 ENCOUNTER — Encounter: Payer: Self-pay | Admitting: Podiatry

## 2022-08-08 ENCOUNTER — Ambulatory Visit: Payer: Medicare HMO | Admitting: Podiatry

## 2022-08-08 DIAGNOSIS — G5792 Unspecified mononeuropathy of left lower limb: Secondary | ICD-10-CM

## 2022-08-08 DIAGNOSIS — M778 Other enthesopathies, not elsewhere classified: Secondary | ICD-10-CM

## 2022-08-08 DIAGNOSIS — M722 Plantar fascial fibromatosis: Secondary | ICD-10-CM

## 2022-08-08 DIAGNOSIS — M7752 Other enthesopathy of left foot: Secondary | ICD-10-CM | POA: Diagnosis not present

## 2022-08-08 NOTE — Progress Notes (Signed)
She presents today for follow-up of her capsulitis bilateral foot and ankle.  States that they really have not been doing very badly lately and feeling much better.  Objective: Vital signs stable alert oriented x3.  Pulses are palpable.  No reproducible pain on palpation today no open lesions or wounds noted.  Assessment: Significant osteoarthritis of the midfoot bilateral.  Subtalar joint capsulitis right foot.  Plan: No injections today follow-up with me on as-needed basis.

## 2022-08-09 DIAGNOSIS — Z6838 Body mass index (BMI) 38.0-38.9, adult: Secondary | ICD-10-CM | POA: Diagnosis not present

## 2022-08-09 DIAGNOSIS — Z01419 Encounter for gynecological examination (general) (routine) without abnormal findings: Secondary | ICD-10-CM | POA: Diagnosis not present

## 2022-08-09 DIAGNOSIS — Z1272 Encounter for screening for malignant neoplasm of vagina: Secondary | ICD-10-CM | POA: Diagnosis not present

## 2022-08-10 DIAGNOSIS — G4733 Obstructive sleep apnea (adult) (pediatric): Secondary | ICD-10-CM | POA: Diagnosis not present

## 2022-08-18 DIAGNOSIS — H43812 Vitreous degeneration, left eye: Secondary | ICD-10-CM | POA: Diagnosis not present

## 2022-08-18 DIAGNOSIS — E119 Type 2 diabetes mellitus without complications: Secondary | ICD-10-CM | POA: Diagnosis not present

## 2022-08-18 DIAGNOSIS — H25813 Combined forms of age-related cataract, bilateral: Secondary | ICD-10-CM | POA: Diagnosis not present

## 2022-08-18 DIAGNOSIS — H04123 Dry eye syndrome of bilateral lacrimal glands: Secondary | ICD-10-CM | POA: Diagnosis not present

## 2022-08-21 ENCOUNTER — Ambulatory Visit: Payer: Medicare HMO | Attending: Cardiology | Admitting: Cardiology

## 2022-08-21 ENCOUNTER — Encounter: Payer: Self-pay | Admitting: Cardiology

## 2022-08-21 VITALS — BP 140/60 | HR 64 | Ht 63.0 in | Wt 210.0 lb

## 2022-08-21 DIAGNOSIS — I10 Essential (primary) hypertension: Secondary | ICD-10-CM | POA: Diagnosis not present

## 2022-08-21 DIAGNOSIS — R931 Abnormal findings on diagnostic imaging of heart and coronary circulation: Secondary | ICD-10-CM

## 2022-08-21 DIAGNOSIS — I480 Paroxysmal atrial fibrillation: Secondary | ICD-10-CM | POA: Diagnosis not present

## 2022-08-21 DIAGNOSIS — G4733 Obstructive sleep apnea (adult) (pediatric): Secondary | ICD-10-CM | POA: Diagnosis not present

## 2022-08-21 NOTE — Progress Notes (Signed)
Cardiology Office Note:    Date:  08/21/2022   ID:  Erika Kim, DOB 26-Jun-1946, MRN [id number]  PCP:  [name], MD   [place]  Cardiologist:  [name], MD  Advanced Practice Provider:  No care team member to display Electrophysiologist:  Will Meredith Leeds, MD      Referring MD: [name], MD   History of Present Illness:    Erika Kim is a 76 y.o. female here for the follow-up of atrial fibrillation  05/29/2019-cardioversion.  Had ablation repeat on 10/29/2019.  No return of atrial fibrillation.  Doing well.  No chest pain.  No recurrence of GI bleeding.  Colonoscopy reassuring recently.  Has acoustic neuroma, right sided hearing loss.  Followed at [facility].  Patient was doing well at her last visit with me on 08/26/2021 apart from having 1 severe episode of A-fib. She reported that her Fit Bit detected asymptomatic episodes of A-fib once daily at that time. She was seen by Dr. Curt Bears on 01/31/2022 and was doing well without any bothersome symptoms or changes to her medication regimen. Patient was last seen by PA Tommye Standard on 08/07/2022 and noted that she was doing well and walking 2 miles a day with good exercise tolerance.  Today, she states that she has been doing well overall. She denies having any palpitations apart from a mild episode of A-fib after taking 1 pain pill following left reverse TSA on 03/28/22. She reports being told by PA Charlcie Cradle that she had an irregular heartbeat at her last visit. She remains on Xarelto.  Patient notes some occasional shortness of breath but nothing that is limiting for her. She is able to walk 2 miles daily without any issue.  She checks her blood pressure at home and finds it to be well controlled. She notes taking Prednisone last week for her sciatica and feels that this may be contributing to her elevated blood pressure of 140/60.  She is scheduled for routine lab work later this week with  her PCP.  She denies any chest pain, lightheadedness, headaches, syncope, orthopnea, PND, lower extremity edema or exertional symptoms.   Past Medical History:  Diagnosis Date   Acoustic neuroma ([place]) followed by dr brown at [facility]   right side w/ chronic roaring noise   Anemia    hx of 2019 realted to Xarelto    Anticoagulant long-term use    xarelto   Benign paroxysmal positional vertigo    Dysrhythmia    afib- 2 ablations followed by Dr Marlou Porch - [facility] - 4/21    Endometrial polyp    Family history of adverse reaction to anesthesia    daughter- nausea    Frequency of urination    Hx of transfusion of packed red blood cells    related to small intestine in 2019    Hyperlipidemia    Hypertension    OA (osteoarthritis)    left knee   OSA on CPAP    cpap - setting at ? 8    PAF (paroxysmal atrial fibrillation) [facility]) cardiologist-  dr Marlou Porch   dx 04/ 2017   PMB (postmenopausal bleeding)    TMJ (temporomandibular joint disorder)    left side-- wears guard   Type 2 diabetes mellitus ([place])    Wears contact lenses     Past Surgical History:  Procedure Laterality Date   ATRIAL FIBRILLATION ABLATION N/A 05/29/2019   Procedure: ATRIAL FIBRILLATION ABLATION;  Surgeon: Curt Bears, Will  Hassell Done, MD;  Location: [place] [facility];  Service: Cardiovascular;  Laterality: N/A;   ATRIAL FIBRILLATION ABLATION N/A 10/29/2019   Procedure: ATRIAL FIBRILLATION ABLATION;  Surgeon: [name], MD;  Location: [place] [facility];  Service: Cardiovascular;  Laterality: N/A;   CARDIOVERSION N/A 06/24/2019   Procedure: CARDIOVERSION;  Surgeon: Skeet Latch, MD;  Location: [place];  Service: Cardiovascular;  Laterality: N/A;   CARPAL TUNNEL RELEASE Right 04/17/2007   w/ Excision ganglion cyst and Pulley Release right thumb   DILATATION & CURETTAGE/HYSTEROSCOPY WITH MYOSURE N/A 01/12/2017   Procedure: DILATATION & CURETTAGE/HYSTEROSCOPY WITH MYOSURE;  Surgeon: Arvella Nigh, MD;   Location: [place];  Service: Gynecology;  Laterality: N/A;   ESOPHAGOGASTRODUODENOSCOPY (EGD) WITH PROPOFOL N/A 09/16/2018   Procedure: ESOPHAGOGASTRODUODENOSCOPY (EGD) WITH PROPOFOL;  Surgeon: Ronald Lobo, MD;  Location: [place];  Service: Endoscopy;  Laterality: N/A;   GIVENS CAPSULE STUDY N/A 09/16/2018   Procedure: GIVENS CAPSULE STUDY;  Surgeon: Ronald Lobo, MD;  Location: [place];  Service: Endoscopy;  Laterality: N/A;   HAMMER TOE SURGERY Left 2010   left second toe   IR RADIOLOGY PERIPHERAL GUIDED IV START  10/27/2019   IR US GUIDE VASC ACCESS RIGHT  10/27/2019   KNEE ARTHROSCOPY Right 2010   KNEE SURGERY Left 1993   LYMPH NODE BIOPSY N/A 03/01/2017   Procedure: SENTINEL LYMPH NODE BIOPSY;  Surgeon: Everitt Amber, MD;  Location: [facility];  Service: Gynecology;  Laterality: N/A;   NASAL SINUS SURGERY  1985 and 1995   REVERSE SHOULDER ARTHROPLASTY Left 03/28/2022   Procedure: REVERSE SHOULDER ARTHROPLASTY;  Surgeon: Marchia Bond, MD;  Location: [facility];  Service: Orthopedics;  Laterality: Left;   ROBOTIC ASSISTED TOTAL HYSTERECTOMY WITH BILATERAL SALPINGO OOPHERECTOMY Bilateral 03/01/2017   Procedure: XI ROBOTIC ASSISTED TOTAL HYSTERECTOMY WITH BILATERAL SALPINGO OOPHORECTOMY;  Surgeon: Everitt Amber, MD;  Location: [facility];  Service: Gynecology;  Laterality: Bilateral;   ROTATOR CUFF REPAIR Right 04/2004   TOE SURGERY Right 2012   Great toe and second toe   TOTAL ELBOW REPLACEMENT Right 2000   TOTAL KNEE ARTHROPLASTY Right 01/12/2010   TOTAL KNEE ARTHROPLASTY Left 03/02/2020   Procedure: TOTAL KNEE ARTHROPLASTY;  Surgeon: Renette Butters, MD;  Location: [facility];  Service: Orthopedics;  Laterality: Left;   TRANSTHORACIC ECHOCARDIOGRAM  03/28/2016   ef 60-65%/ mild AR and MR   TUBAL LIGATION Bilateral 1978   ULNAR NERVE TRANSPOSITION Right 04/21/2021   Procedure: RIGHT ULNAR NERVE DECOMPRESSION ELBOW;  Surgeon: Leanora Cover, MD;  Location: [place];  Service: Orthopedics;  Laterality: Right;  axillary block    Current Medications: Current Meds  Medication Sig   allopurinol (ZYLOPRIM) 100 MG tablet Take 100 mg by mouth daily.   atorvastatin (LIPITOR) 40 MG tablet Take 40 mg by mouth every morning.    betamethasone valerate (VALISONE) 0.1 % cream Apply 1 application. topically 2 (two) times a week.   Cholecalciferol (VITAMIN D3) 5000 units TABS Take 5,000 Units by mouth daily.    Cranberry-Vitamin C (CRANBERRY CONCENTRATE/VITAMINC PO) Take 1 tablet by mouth daily.    docusate sodium (COLACE) 100 MG capsule Take 100 mg by mouth 2 (two) times daily.   hydrochlorothiazide (HYDRODIURIL) 12.5 MG tablet Take 12.5 mg by mouth daily.    ipratropium (ATROVENT) 0.03 % nasal spray Place 2 sprays into both nostrils every 12 (twelve) hours as needed for rhinitis.   JANUVIA 100 MG tablet Take 100 mg by mouth daily after supper.  Lancets (ONETOUCH DELICA PLUS [id number]) MISC    metoprolol succinate (TOPROL-XL) 50 MG 24 hr tablet take 1 tablet by mouth in the morning and at bedtime with or immediately following a meal   Multiple Vitamin (MULTIVITAMIN) tablet Take 1 tablet by mouth daily.   olmesartan (BENICAR) 20 MG tablet Take 20 mg by mouth daily.   ONE TOUCH ULTRA TEST test strip    Polyethyl Glycol-Propyl Glycol (SYSTANE OP) Place 1 drop into both eyes daily as needed (dry eyes).   rivaroxaban (XARELTO) 20 MG TABS tablet TAKE ONE TABLET BY MOUTH ONE TIME DAILY WITH SUPPER   saline (AYR) GEL Place 1 application into the nose at bedtime.   triamcinolone cream (KENALOG) 0.1 % Apply 1 application. topically daily as needed for itching.     Allergies:   Cephalexin, Augmentin [amoxicillin-pot clavulanate], and Cephalosporins   Social History   Socioeconomic History   Marital status: Married    Spouse name: Not on file   Number of children: 2   Years of education: 12   Highest education level: Not on file  Occupational History    Not on file  Tobacco Use   Smoking status: Never    Passive exposure: Never   Smokeless tobacco: Never  Vaping Use   Vaping Use: Never used  Substance and Sexual Activity   Alcohol use: No   Drug use: No   Sexual activity: Yes  Other Topics Concern   Not on file  Social History Narrative   Married w/2 children (boy and girl).  Retired.   Social Determinants of Health   Financial Resource Strain: Not on file  Food Insecurity: Not on file  Transportation Needs: Not on file  Physical Activity: Not on file  Stress: Not on file  Social Connections: Not on file     Family History: The patient's family history includes Heart failure in her father and mother; Sleep apnea in her brother.  ROS:   Please see the history of present illness.    All other systems reviewed and are negative.  EKGs/Labs/Other Studies Reviewed:    Atrial Fibrillation Ablation 10/29/2019: CONCLUSIONS: 1. Sinus rhythm upon presentation.   2. Successful electrical isolation and anatomical encircling of all four pulmonary veins with radiofrequency current.    3. Cavo-tricuspid isthmus ablation was performed with complete bidirectional isthmus block achieved.  4. No inducible arrhythmias following ablation both on and off of dobutamine 5. No early apparent complications.  Atrial Fibrillation Ablation 05/29/2019: CONCLUSIONS: 1. Sinus rhythm upon presentation.   2. Successful electrical isolation and anatomical encircling of all four pulmonary veins with radiofrequency current. 3. No inducible arrhythmias following ablation both on and off of dobutamine 4. No early apparent complications.  ETT 06/28/2018: Blood pressure demonstrated a normal response to exercise. There was no ST segment deviation noted during stress.  Negative ETT but patient only achieved 78% of PMHR at 118 bpm Normal hemodynamic response Rare PVCls with stress    Echo 03/28/2016: - Left ventricle: The cavity size was normal. Systolic  function was    normal. The estimated ejection fraction was in the range of 60%    to 65%. Wall motion was normal; there were no regional wall    motion abnormalities. The transmitral flow pattern was normal.    Left ventricular diastolic function parameters were normal.  - Aortic valve: Moderate focal calcification, consistent with    sclerosis. There was mild regurgitation.  - Mitral valve: There was mild regurgitation.   EKG:  EKG is personally reviewed and interpreted. 08/21/2022: EKG was not ordered today. 08/26/2021: EKG was not ordered. 12/23/2020: EKG was not ordered.  Recent Labs: 03/29/2022: BUN 10; Creatinine, Ser 0.58; Hemoglobin 12.1; Platelets 132; Potassium 4.2; Sodium 140   Recent Lipid Panel No results found for: "CHOL", "TRIG", "HDL", "CHOLHDL", "VLDL", "LDLCALC", "LDLDIRECT"   Risk Assessment/Calculations:      Physical Exam:    VS:  BP (!) 140/60 (BP Location: Left Arm, Patient Position: Sitting, Cuff Size: Normal)   Pulse 64   Ht '5\' 3"'$  (1.6 m)   Wt 210 lb (95.3 kg)   SpO2 98%   BMI 37.20 kg/m     Wt Readings from Last 3 Encounters:  08/21/22 210 lb (95.3 kg)  08/07/22 212 lb 9.6 oz (96.4 kg)  05/01/22 211 lb (95.7 kg)     GEN:  Well nourished, well developed in no acute distress HEENT: Normal NECK: No JVD; No carotid bruits LYMPHATICS: No lymphadenopathy CARDIAC: RRR, no murmurs, rubs, gallops RESPIRATORY:  Clear to auscultation without rales, wheezing or rhonchi  ABDOMEN: Soft, non-tender, non-distended MUSCULOSKELETAL:  No edema; No deformity  SKIN: Warm and dry NEUROLOGIC:  Alert and oriented x 3 PSYCHIATRIC:  Normal affect   ASSESSMENT:    1. Paroxysmal atrial fibrillation (HCC)   2. Elevated coronary artery calcium score   3. Primary hypertension   4. OSA (obstructive sleep apnea)      PLAN:    In order of problems listed above:    Paroxysmal atrial fibrillation flutter -Repeat ablation 10/29/2019.  Remaining in sinus rhythm.   Flecainide was stopped in April 2021.  Continue with Toprol in the evening.  Dr. Curt Bears.  Doing well.  No recent episodes.  Chronic anticoagulation - Xarelto 20 mg.  Dr. Sharlett Iles about to check labs.  Last hemoglobin 11.9, creatinine 0.7  Obstructive sleep apnea -CPAP compliance encouraged.  No changes  Essential hypertension -Well-controlled on current medications which include HCTZ Toprol 50 Benicar 20.  Slightly elevated at this clinic visit however at prior clinic visit was 962 systolic.  Excellent.  Prior GI bleed --3 years ago, stable.  No further recurrence on Xarelto.   Follow-up: Follow up in 1 year   Medication Adjustments/Labs and Tests Ordered: Current medicines are reviewed at length with the patient today.  Concerns regarding medicines are outlined above.   No orders of the defined types were placed in this encounter.   No orders of the defined types were placed in this encounter.   Patient Instructions  Medication Instructions:  The current medical regimen is effective;  continue present plan and medications.  *If you need a refill on your cardiac medications before your next appointment, please call your pharmacy*  Follow-Up: At [facility], you and your health needs are our priority.  As part of our continuing mission to provide you with exceptional heart care, we have created designated Provider Care Teams.  These Care Teams include your primary Cardiologist (physician) and Advanced Practice Providers (APPs -  Physician Assistants and Nurse Practitioners) who all work together to provide you with the care you need, when you need it.  We recommend signing up for the patient portal called "MyChart".  Sign up information is provided on this After Visit Summary.  MyChart is used to connect with patients for Virtual Visits (Telemedicine).  Patients are able to view lab/test results, encounter notes, upcoming appointments, etc.  Non-urgent messages can be  sent to your provider as well.   To  learn more about what you can do with MyChart, go to [email or web address].    Your next appointment:   1 year(s)  The format for your next appointment:   In Person  Provider:   [name], MD     Important Information About Sugar          I,Alexis Herring,acting as a scribe for [name], MD.,have documented all relevant documentation on the behalf of [name], MD,as directed by  [name], MD while in the presence of [name], MD.  I, [name], MD, have reviewed all documentation for this visit. The documentation on 08/21/22 for the exam, diagnosis, procedures, and orders are all accurate and complete.   Signed, [name], MD  08/21/2022 9:49 AM    [facility]

## 2022-08-21 NOTE — Patient Instructions (Signed)
Medication Instructions:  The current medical regimen is effective;  continue present plan and medications.  *If you need a refill on your cardiac medications before your next appointment, please call your pharmacy*  Follow-Up: At [facility], you and your health needs are our priority.  As part of our continuing mission to provide you with exceptional heart care, we have created designated Provider Care Teams.  These Care Teams include your primary Cardiologist (physician) and Advanced Practice Providers (APPs -  Physician Assistants and Nurse Practitioners) who all work together to provide you with the care you need, when you need it.  We recommend signing up for the patient portal called "MyChart".  Sign up information is provided on this After Visit Summary.  MyChart is used to connect with patients for Virtual Visits (Telemedicine).  Patients are able to view lab/test results, encounter notes, upcoming appointments, etc.  Non-urgent messages can be sent to your provider as well.   To learn more about what you can do with MyChart, go to [email or web address].    Your next appointment:   1 year(s)  The format for your next appointment:   In Person  Provider:   Mark Skains, MD      Important Information About Sugar       

## 2022-08-24 DIAGNOSIS — M5451 Vertebrogenic low back pain: Secondary | ICD-10-CM | POA: Diagnosis not present

## 2022-08-24 DIAGNOSIS — M5416 Radiculopathy, lumbar region: Secondary | ICD-10-CM | POA: Diagnosis not present

## 2022-08-28 DIAGNOSIS — E785 Hyperlipidemia, unspecified: Secondary | ICD-10-CM | POA: Diagnosis not present

## 2022-08-28 DIAGNOSIS — I10 Essential (primary) hypertension: Secondary | ICD-10-CM | POA: Diagnosis not present

## 2022-08-28 DIAGNOSIS — E1151 Type 2 diabetes mellitus with diabetic peripheral angiopathy without gangrene: Secondary | ICD-10-CM | POA: Diagnosis not present

## 2022-08-28 DIAGNOSIS — R7989 Other specified abnormal findings of blood chemistry: Secondary | ICD-10-CM | POA: Diagnosis not present

## 2022-08-28 LAB — COMPLETE METABOLIC PANEL WITH GFR: EGFR: 81.8

## 2022-08-28 LAB — LAB REPORT - SCANNED: A1c: 5.8

## 2022-08-30 DIAGNOSIS — M5451 Vertebrogenic low back pain: Secondary | ICD-10-CM | POA: Diagnosis not present

## 2022-08-30 DIAGNOSIS — M5416 Radiculopathy, lumbar region: Secondary | ICD-10-CM | POA: Diagnosis not present

## 2022-09-01 DIAGNOSIS — G4733 Obstructive sleep apnea (adult) (pediatric): Secondary | ICD-10-CM | POA: Diagnosis not present

## 2022-09-04 DIAGNOSIS — G4733 Obstructive sleep apnea (adult) (pediatric): Secondary | ICD-10-CM | POA: Diagnosis not present

## 2022-09-04 DIAGNOSIS — I739 Peripheral vascular disease, unspecified: Secondary | ICD-10-CM | POA: Diagnosis not present

## 2022-09-04 DIAGNOSIS — M5416 Radiculopathy, lumbar region: Secondary | ICD-10-CM | POA: Diagnosis not present

## 2022-09-04 DIAGNOSIS — Z1212 Encounter for screening for malignant neoplasm of rectum: Secondary | ICD-10-CM | POA: Diagnosis not present

## 2022-09-04 DIAGNOSIS — E1151 Type 2 diabetes mellitus with diabetic peripheral angiopathy without gangrene: Secondary | ICD-10-CM | POA: Diagnosis not present

## 2022-09-04 DIAGNOSIS — Z1231 Encounter for screening mammogram for malignant neoplasm of breast: Secondary | ICD-10-CM | POA: Diagnosis not present

## 2022-09-04 DIAGNOSIS — Z1331 Encounter for screening for depression: Secondary | ICD-10-CM | POA: Diagnosis not present

## 2022-09-04 DIAGNOSIS — I480 Paroxysmal atrial fibrillation: Secondary | ICD-10-CM | POA: Diagnosis not present

## 2022-09-04 DIAGNOSIS — R82998 Other abnormal findings in urine: Secondary | ICD-10-CM | POA: Diagnosis not present

## 2022-09-04 DIAGNOSIS — Z1339 Encounter for screening examination for other mental health and behavioral disorders: Secondary | ICD-10-CM | POA: Diagnosis not present

## 2022-09-04 DIAGNOSIS — Z0000 Encounter for general adult medical examination without abnormal findings: Secondary | ICD-10-CM | POA: Diagnosis not present

## 2022-09-04 DIAGNOSIS — I10 Essential (primary) hypertension: Secondary | ICD-10-CM | POA: Diagnosis not present

## 2022-09-05 DIAGNOSIS — M5451 Vertebrogenic low back pain: Secondary | ICD-10-CM | POA: Diagnosis not present

## 2022-09-05 DIAGNOSIS — M5416 Radiculopathy, lumbar region: Secondary | ICD-10-CM | POA: Diagnosis not present

## 2022-09-07 DIAGNOSIS — M5451 Vertebrogenic low back pain: Secondary | ICD-10-CM | POA: Diagnosis not present

## 2022-09-07 DIAGNOSIS — M5416 Radiculopathy, lumbar region: Secondary | ICD-10-CM | POA: Diagnosis not present

## 2022-09-10 DIAGNOSIS — G4733 Obstructive sleep apnea (adult) (pediatric): Secondary | ICD-10-CM | POA: Diagnosis not present

## 2022-09-11 ENCOUNTER — Telehealth: Payer: Self-pay | Admitting: Podiatry

## 2022-09-11 DIAGNOSIS — M5416 Radiculopathy, lumbar region: Secondary | ICD-10-CM | POA: Diagnosis not present

## 2022-09-11 DIAGNOSIS — M5451 Vertebrogenic low back pain: Secondary | ICD-10-CM | POA: Diagnosis not present

## 2022-09-11 NOTE — Telephone Encounter (Signed)
Patient stated that she is experiencing some right foot pain on the top of foot. She stated that Dr. Milinda Pointer told her to call the office if it started again. I did check to see if we had some avail to get her scheduled but there is none this week, Is there anything she can do in the mean time.   Please advise

## 2022-09-12 NOTE — Telephone Encounter (Signed)
You can see if someone can see her tomorrow, if she wants

## 2022-09-12 NOTE — Telephone Encounter (Signed)
Pt called again to check if any availability to be seen before she goes out of town on Thursday. Pt thought she only had to call to be seen same day for injectionam.

## 2022-09-13 DIAGNOSIS — M5451 Vertebrogenic low back pain: Secondary | ICD-10-CM | POA: Diagnosis not present

## 2022-09-13 DIAGNOSIS — M5416 Radiculopathy, lumbar region: Secondary | ICD-10-CM | POA: Diagnosis not present

## 2022-09-18 DIAGNOSIS — M5451 Vertebrogenic low back pain: Secondary | ICD-10-CM | POA: Diagnosis not present

## 2022-09-18 DIAGNOSIS — M5416 Radiculopathy, lumbar region: Secondary | ICD-10-CM | POA: Diagnosis not present

## 2022-09-18 NOTE — Telephone Encounter (Signed)
Pt called stating she needed an appt for right foot pain possibly needing an injection. Offered for next week and she stated she was leaving town this Thursday for 3 months. I told pt to call this afternoon to see if we had any cancellations for Dr Milinda Pointer. I did offer appt with Dr Paulla Dolly for Thursday as well but she is leaving that morning. She asked me to send a message to Dr Milinda Pointer and I did tell her he is not in the office today.

## 2022-09-18 NOTE — Telephone Encounter (Signed)
Pt called this morning to see if she can be seen before Thurs of this week due to she is leaving to go out of town for 3 mths. I let her know that Dr. Milinda Pointer is not in office today.  Please advise

## 2022-09-19 ENCOUNTER — Ambulatory Visit: Payer: Medicare HMO | Admitting: Podiatry

## 2022-09-19 ENCOUNTER — Encounter: Payer: Self-pay | Admitting: Podiatry

## 2022-09-19 DIAGNOSIS — M778 Other enthesopathies, not elsewhere classified: Secondary | ICD-10-CM

## 2022-09-19 MED ORDER — TRIAMCINOLONE ACETONIDE 40 MG/ML IJ SUSP
20.0000 mg | Freq: Once | INTRAMUSCULAR | Status: AC
  Administered 2022-09-19: 20 mg

## 2022-09-19 NOTE — Progress Notes (Signed)
She presents today states that her right foot and ankle started hurting recently.  She is about to leave for [place] for 3 months so she would like to have it taken care of.  She states that the arthritis is acting up.  Objective: Vital signs are stable she is alert and oriented x 3.  She has severe pain from plane range of motion of the palpation of the dorsal aspect of the foot at the tarsometatarsal joints right.  Assessment: Capsulitis osteoarthritis tarsometatarsal joints right.  Plan: Discussed etiology pathology and surgical therapies I injected 20 mg of Kenalog and local anesthetic across the dorsum of the foot today.  She tolerated procedure well without complications we will follow-up with her in April or May when she returns from [place].

## 2022-09-20 DIAGNOSIS — M5451 Vertebrogenic low back pain: Secondary | ICD-10-CM | POA: Diagnosis not present

## 2022-09-20 DIAGNOSIS — M5416 Radiculopathy, lumbar region: Secondary | ICD-10-CM | POA: Diagnosis not present

## 2022-10-01 DIAGNOSIS — G4733 Obstructive sleep apnea (adult) (pediatric): Secondary | ICD-10-CM | POA: Diagnosis not present

## 2022-10-09 DIAGNOSIS — Z8249 Family history of ischemic heart disease and other diseases of the circulatory system: Secondary | ICD-10-CM | POA: Diagnosis not present

## 2022-10-09 DIAGNOSIS — Z7901 Long term (current) use of anticoagulants: Secondary | ICD-10-CM | POA: Diagnosis not present

## 2022-10-09 DIAGNOSIS — M1990 Unspecified osteoarthritis, unspecified site: Secondary | ICD-10-CM | POA: Diagnosis not present

## 2022-10-09 DIAGNOSIS — K5900 Constipation, unspecified: Secondary | ICD-10-CM | POA: Diagnosis not present

## 2022-10-09 DIAGNOSIS — D6869 Other thrombophilia: Secondary | ICD-10-CM | POA: Diagnosis not present

## 2022-10-09 DIAGNOSIS — R32 Unspecified urinary incontinence: Secondary | ICD-10-CM | POA: Diagnosis not present

## 2022-10-09 DIAGNOSIS — I10 Essential (primary) hypertension: Secondary | ICD-10-CM | POA: Diagnosis not present

## 2022-10-09 DIAGNOSIS — E119 Type 2 diabetes mellitus without complications: Secondary | ICD-10-CM | POA: Diagnosis not present

## 2022-10-09 DIAGNOSIS — I4891 Unspecified atrial fibrillation: Secondary | ICD-10-CM | POA: Diagnosis not present

## 2022-10-09 DIAGNOSIS — Z7984 Long term (current) use of oral hypoglycemic drugs: Secondary | ICD-10-CM | POA: Diagnosis not present

## 2022-10-09 DIAGNOSIS — M109 Gout, unspecified: Secondary | ICD-10-CM | POA: Diagnosis not present

## 2022-10-30 DIAGNOSIS — G4733 Obstructive sleep apnea (adult) (pediatric): Secondary | ICD-10-CM | POA: Diagnosis not present

## 2022-11-30 DIAGNOSIS — G4733 Obstructive sleep apnea (adult) (pediatric): Secondary | ICD-10-CM | POA: Diagnosis not present

## 2022-12-14 DIAGNOSIS — J069 Acute upper respiratory infection, unspecified: Secondary | ICD-10-CM | POA: Diagnosis not present

## 2022-12-29 DIAGNOSIS — G4733 Obstructive sleep apnea (adult) (pediatric): Secondary | ICD-10-CM | POA: Diagnosis not present

## 2023-01-01 DIAGNOSIS — S43422D Sprain of left rotator cuff capsule, subsequent encounter: Secondary | ICD-10-CM | POA: Diagnosis not present

## 2023-01-28 DIAGNOSIS — G4733 Obstructive sleep apnea (adult) (pediatric): Secondary | ICD-10-CM | POA: Diagnosis not present

## 2023-02-06 ENCOUNTER — Encounter: Payer: Self-pay | Admitting: Podiatry

## 2023-02-06 ENCOUNTER — Ambulatory Visit: Payer: Medicare HMO | Admitting: Podiatry

## 2023-02-06 DIAGNOSIS — D649 Anemia, unspecified: Secondary | ICD-10-CM | POA: Insufficient documentation

## 2023-02-06 DIAGNOSIS — G4733 Obstructive sleep apnea (adult) (pediatric): Secondary | ICD-10-CM | POA: Insufficient documentation

## 2023-02-06 DIAGNOSIS — M778 Other enthesopathies, not elsewhere classified: Secondary | ICD-10-CM

## 2023-02-06 MED ORDER — TRIAMCINOLONE ACETONIDE 40 MG/ML IJ SUSP
40.0000 mg | Freq: Once | INTRAMUSCULAR | Status: AC
  Administered 2023-02-06: 40 mg

## 2023-02-06 NOTE — Progress Notes (Signed)
She presents today for follow-up of her capsulitis of the right foot states that it is better than it was but my left foot is really starting to act up to she states that it feels like he will Fenamide shoes very easily.  States that it feels swollen and this makes my shoes tight.  Objective: Vital signs are stable alert oriented x 3.  Pulses are palpable.  She has severe osteoarthritis of the bilateral tarsometatarsal joints.  This is/significant on the left foot that not only can you feel it on palpation dorsally but she can feel it through the arch.  The right foot is a little less thick with the osteoarthritic changes but dorsally and dorsal laterally does demonstrate significant osteoarthritic change with spurring.  Assessment: Osteoarthritis tarsometatarsal joints bilateral.  Plan: I injected the dorsal aspect the tarsometatarsal joints today hoping to alleviate some of her symptomatology may need to consider arch injections at some point in the near future.

## 2023-02-09 ENCOUNTER — Other Ambulatory Visit: Payer: Self-pay

## 2023-02-09 MED ORDER — RIVAROXABAN 20 MG PO TABS: ORAL_TABLET | ORAL | 1 refills | Status: DC

## 2023-02-09 NOTE — Telephone Encounter (Signed)
Faxed refill request for Xarelto received from [facility].  Pt last saw Dr Anne Fu 08/21/22, last labs 03/29/22 Creat 0.58, age 77, weight 95.3kg, CrCl 126.09 based on CrCl pt is on appropriate dosage of Xarelto  QD for afib.  Will refill rx.

## 2023-02-11 NOTE — Progress Notes (Signed)
Cardiology Office Note Date:  02/11/2023  Patient ID:  Erika Kim, Erika Kim 09/28/1946, MRN [id number] PCP:  Garlan Fillers, MD  Cardiologist:  Dr. Anne Fu Electrophysiologist: Dr. Elberta Fortis    Chief Complaint:  6 mo  History of Present Illness: Erika Kim is a 77 y.o. female with history of HTN, HLD, DM, OSA (w/CPAP), Acoustic neuroma w/deafness on R, AFib  She saw Dr. Anne Fu Nov 2021, noted some detections of AFib via her wearable tech, feeling well.  No changes were made.  She saw Dr. Elberta Fortis April 2023, c/w some brief palpitations, bu t improved and happy with her rhythm control. No changes were made.  I saw her 08/07/22 She continues to do quite well. No CP, palpitations or cardiac awareness of any kind. She walks 2mi/day, feels well with good exertional capacity No SOB No dizzy spells, near syncope or syncope. No bleeding or signs of bleeding She continues to observe some elevated Hrs via her watch data but no symptoms/awareness of them In review of her data, these are brief spikes in HRs probably towards 120's, though do not appear to be of any significant duration They are not daily She was quite happy with her management/symptom burden and no changes were made.  She saw Dr. Anne Fu 08/21/22, occ SOB but walking 2mi daily, on prednisone for sciatica.  No changes were made  TODAY She is doing well Walks most days 2 miles or so. Gets a little winded when she is at her faster pace, great exertional capacity, no intolerances to her ADLs No CP Occasionally when she first gets in bed at noght and is still she gets the sense her heart is fats/beating hard, but her watch tells her HR 60's. No associated symptoms otherwise and settles within a few minutes. No dizzy spells, near syncope or syncope. No CP No bleeding or signs of bleeding   Afib/AAD hx Diagnosed April 2017 Flecainide started 2019 PVI ablation 05/29/2019 AFlutter noted Aug 2020 PVI/CTI ablation  10/29/2019 Flecainide stopped April 2021   Past Medical History:  Diagnosis Date   Acoustic neuroma followed by dr brown at [facility]   right side w/ chronic roaring noise   Anemia    hx of 2019 realted to Xarelto    Anticoagulant long-term use    xarelto   Benign paroxysmal positional vertigo    Dysrhythmia    afib- 2 ablations followed by Dr Anne Fu - [facility] - 4/21    Endometrial polyp    Family history of adverse reaction to anesthesia    daughter- nausea    Frequency of urination    Hx of transfusion of packed red blood cells    related to small intestine in 2019    Hyperlipidemia    Hypertension    OA (osteoarthritis)    left knee   OSA on CPAP    cpap - setting at ? 8    PAF (paroxysmal atrial fibrillation) cardiologist-  dr Anne Fu   dx 04/ 2017   PMB (postmenopausal bleeding)    TMJ (temporomandibular joint disorder)    left side-- wears guard   Type 2 diabetes mellitus    Wears contact lenses     Past Surgical History:  Procedure Laterality Date   ATRIAL FIBRILLATION ABLATION N/A 05/29/2019   Procedure: ATRIAL FIBRILLATION ABLATION;  Surgeon: Regan Lemming, MD;  [facility];  Service: Cardiovascular;  Laterality: N/A;   ATRIAL FIBRILLATION ABLATION N/A 10/29/2019   Procedure: ATRIAL  FIBRILLATION ABLATION;  Surgeon: Regan Lemming, MD;  Location: [facility] INVASIVE CV LAB;  Service: Cardiovascular;  Laterality: N/A;   CARDIOVERSION N/A 06/24/2019   Procedure: CARDIOVERSION;  Surgeon: Chilton Si, MD;  Location: [facility] ENDOSCOPY;  Service: Cardiovascular;  Laterality: N/A;   CARPAL TUNNEL RELEASE Right 04/17/2007   w/ Excision ganglion cyst and Pulley Release right thumb   DILATATION & CURETTAGE/HYSTEROSCOPY WITH MYOSURE N/A 01/12/2017   Procedure: DILATATION & CURETTAGE/HYSTEROSCOPY WITH MYOSURE;  Surgeon: Richardean Chimera, MD;  Location: [facility];  Service: Gynecology;  Laterality: N/A;   ESOPHAGOGASTRODUODENOSCOPY (EGD) WITH  PROPOFOL N/A 09/16/2018   Procedure: ESOPHAGOGASTRODUODENOSCOPY (EGD) WITH PROPOFOL;  Surgeon: Bernette Redbird, MD;  Location: [facility] ENDOSCOPY;  Service: Endoscopy;  Laterality: N/A;   GIVENS CAPSULE STUDY N/A 09/16/2018   Procedure: GIVENS CAPSULE STUDY;  Surgeon: Bernette Redbird, MD;  Location: [facility] ENDOSCOPY;  Service: Endoscopy;  Laterality: N/A;   HAMMER TOE SURGERY Left 2010   left second toe   IR RADIOLOGY PERIPHERAL GUIDED IV START  10/27/2019   IR US GUIDE VASC ACCESS RIGHT  10/27/2019   KNEE ARTHROSCOPY Right 2010   KNEE SURGERY Left 1993   LYMPH NODE BIOPSY N/A 03/01/2017   Procedure: SENTINEL LYMPH NODE BIOPSY;  Surgeon: Adolphus Birchwood, MD;  Location: [facility];  Service: Gynecology;  Laterality: N/A;   NASAL SINUS SURGERY  1985 and 1995   REVERSE SHOULDER ARTHROPLASTY Left 03/28/2022   Procedure: REVERSE SHOULDER ARTHROPLASTY;  Surgeon: Teryl Lucy, MD;  Location: [facility];  Service: Orthopedics;  Laterality: Left;   ROBOTIC ASSISTED TOTAL HYSTERECTOMY WITH BILATERAL SALPINGO OOPHERECTOMY Bilateral 03/01/2017   Procedure: XI ROBOTIC ASSISTED TOTAL HYSTERECTOMY WITH BILATERAL SALPINGO OOPHORECTOMY;  Surgeon: Adolphus Birchwood, MD;  Location: [facility];  Service: Gynecology;  Laterality: Bilateral;   ROTATOR CUFF REPAIR Right 04/2004   TOE SURGERY Right 2012   Great toe and second toe   TOTAL ELBOW REPLACEMENT Right 2000   TOTAL KNEE ARTHROPLASTY Right 01/12/2010   TOTAL KNEE ARTHROPLASTY Left 03/02/2020   Procedure: TOTAL KNEE ARTHROPLASTY;  Surgeon: Sheral Apley, MD;  Location: [facility];  Service: Orthopedics;  Laterality: Left;   TRANSTHORACIC ECHOCARDIOGRAM  03/28/2016   ef 60-65%/ mild AR and MR   TUBAL LIGATION Bilateral 1978   ULNAR NERVE TRANSPOSITION Right 04/21/2021   Procedure: RIGHT ULNAR NERVE DECOMPRESSION ELBOW;  Surgeon: Betha Loa, MD;  Location: [facility];  Service: Orthopedics;  Laterality: Right;  axillary block    Current Outpatient Medications   Medication Sig Dispense Refill   allopurinol (ZYLOPRIM) 100 MG tablet Take 100 mg by mouth daily.     atorvastatin (LIPITOR) 40 MG tablet Take 40 mg by mouth every morning.      betamethasone valerate (VALISONE) 0.1 % cream Apply 1 application. topically 2 (two) times a week.     Cholecalciferol (VITAMIN D3) 5000 units TABS Take 5,000 Units by mouth daily.      Cranberry-Vitamin C (CRANBERRY CONCENTRATE/VITAMINC PO) Take 1 tablet by mouth daily.      docusate sodium (COLACE) 100 MG capsule Take 100 mg by mouth 2 (two) times daily.     hydrochlorothiazide (HYDRODIURIL) 12.5 MG tablet Take 12.5 mg by mouth daily.      ipratropium (ATROVENT) 0.03 % nasal spray Place 2 sprays into both nostrils every 12 (twelve) hours as needed for rhinitis.     JANUVIA 100 MG tablet Take 100 mg by mouth daily after supper.      Lancets [facility] DELICA PLUS  LANCET33G) MISC      metoprolol succinate (TOPROL-XL) 50 MG 24 hr tablet take 1 tablet by mouth in the morning and at bedtime with or immediately following a meal 180 tablet 2   Multiple Vitamin (MULTIVITAMIN) tablet Take 1 tablet by mouth daily.     olmesartan (BENICAR) 20 MG tablet Take 20 mg by mouth daily.     ONE TOUCH ULTRA TEST test strip      Polyethyl Glycol-Propyl Glycol (SYSTANE OP) Place 1 drop into both eyes daily as needed (dry eyes).     rivaroxaban (XARELTO) 20 MG TABS tablet TAKE ONE TABLET BY MOUTH ONE TIME DAILY WITH SUPPER 90 tablet 1   saline (AYR) GEL Place 1 application into the nose at bedtime.     triamcinolone cream (KENALOG) 0.1 % Apply 1 application. topically daily as needed for itching.     No current facility-administered medications for this visit.    Allergies:   Cephalexin, Augmentin [amoxicillin-pot clavulanate], and Cephalosporins   Social History:  The patient  reports that she has never smoked. She has never been exposed to tobacco smoke. She has never used smokeless tobacco. She reports that she does not drink alcohol  and does not use drugs.   Family History:  The patient's family history includes Heart failure in her father and mother; Sleep apnea in her brother.  ROS:  Please see the history of present illness.    All other systems are reviewed and otherwise negative.   PHYSICAL EXAM:  VS:  There were no vitals taken for this visit. BMI: There is no height or weight on file to calculate BMI. Well nourished, well developed, in no acute distress HEENT: normocephalic, atraumatic Neck: no JVD, carotid bruits or masses Cardiac:  RRR; no significant murmurs, no rubs, or gallops Lungs: CTA b/l, no wheezing, rhonchi or rales Abd: soft, nontender MS: no deformity or atrophy Ext: no edema Skin: warm and dry, no rash Neuro:  No gross deficits appreciated Psych: euthymic mood, full affect    EKG:  done today and reviewed by myself SB 56bpm   10/29/2019: EPS/ablation CONCLUSIONS: 1. Sinus rhythm upon presentation.   2. Successful electrical isolation and anatomical encircling of all four pulmonary veins with radiofrequency current.    3. Cavo-tricuspid isthmus ablation was performed with complete bidirectional isthmus block achieved.  4. No inducible arrhythmias following ablation both on and off of dobutamine 5. No early apparent complications.  05/29/2019: EPS/ablation CONCLUSIONS: 1. Sinus rhythm upon presentation.   2. Successful electrical isolation and anatomical encircling of all four pulmonary veins with radiofrequency current. 3. No inducible arrhythmias following ablation both on and off of dobutamine 4. No early apparent complications   06/28/2018: ETT Blood pressure demonstrated a normal response to exercise. There was no ST segment deviation noted during stress.   Negative ETT but patient only achieved 78% of PMHR at 118 bpm Normal hemodynamic response Rare PVCls with stress    03/28/2016: TTE Study Conclusions  - Left ventricle: The cavity size was normal. Systolic function was     normal. The estimated ejection fraction was in the range of 60%    to 65%. Wall motion was normal; there were no regional wall    motion abnormalities. The transmitral flow pattern was normal.    Left ventricular diastolic function parameters were normal.  - Aortic valve: Moderate focal calcification, consistent with    sclerosis. There was mild regurgitation.  - Mitral valve: There was mild regurgitation.  Recent Labs: 03/29/2022: BUN 10; Creatinine, Ser 0.58; Hemoglobin 12.1; Platelets 132; Potassium 4.2; Sodium 140  No results found for requested labs within last 365 days.   CrCl cannot be calculated (Patient's most recent lab result is older than the maximum 21 days allowed.).   Wt Readings from Last 3 Encounters:  08/21/22 210 lb (95.3 kg)  08/07/22 212 lb 9.6 oz (96.4 kg)  05/01/22 211 lb (95.7 kg)     Other studies reviewed: Additional studies/records reviewed today include: summarized above  ASSESSMENT AND PLAN:  Paroxysmal AFib Typical Aflutter CHA2DS2Vasc is 5, on xarelto appropriately dosed no burden by symptoms She is quite happy No changes  Occasional awareness of her heart beat when 1st in bed, watch tells her her HR 60s', no associated symptoms Discussed a few days of monitoring, she will hold off for now, let us know if there is any escalation/change in symptoms, we can pursue monitoring   HTN Looks good  4. Secondary hypercoagulable state Labs today   Disposition: we can see her back in a year, sooner if needed.  she will see Der. Skains in the fall  Current medicines are reviewed at length with the patient today.  The patient did not have any concerns regarding medicines.  Norma Fredrickson, PA-C 02/11/2023 9:43 AM     [facility] [address] [place] [address] [phone] (office)  [phone] (fax)

## 2023-02-12 ENCOUNTER — Ambulatory Visit: Payer: Medicare HMO | Attending: Physician Assistant | Admitting: Physician Assistant

## 2023-02-12 ENCOUNTER — Encounter: Payer: Self-pay | Admitting: Physician Assistant

## 2023-02-12 VITALS — BP 128/76 | HR 56 | Ht 63.0 in | Wt 204.2 lb

## 2023-02-12 DIAGNOSIS — I483 Typical atrial flutter: Secondary | ICD-10-CM | POA: Diagnosis not present

## 2023-02-12 DIAGNOSIS — I480 Paroxysmal atrial fibrillation: Secondary | ICD-10-CM | POA: Diagnosis not present

## 2023-02-12 DIAGNOSIS — I10 Essential (primary) hypertension: Secondary | ICD-10-CM | POA: Diagnosis not present

## 2023-02-12 DIAGNOSIS — D6869 Other thrombophilia: Secondary | ICD-10-CM

## 2023-02-12 LAB — BASIC METABOLIC PANEL
BUN/Creatinine Ratio: 26 (ref 12–28)
BUN: 18 mg/dL (ref 8–27)
CO2: 27 mmol/L (ref 20–29)
Calcium: 9.8 mg/dL (ref 8.7–10.3)
Chloride: 102 mmol/L (ref 96–106)
Creatinine, Ser: 0.68 mg/dL (ref 0.57–1.00)
Glucose: 100 mg/dL — ABNORMAL HIGH (ref 70–99)
Potassium: 4.9 mmol/L (ref 3.5–5.2)
Sodium: 139 mmol/L (ref 134–144)
eGFR: 91 mL/min/{1.73_m2} (ref >59)

## 2023-02-12 LAB — CBC
Hematocrit: 43.2 % (ref 34.0–46.6)
Hemoglobin: 14.7 g/dL (ref 11.1–15.9)
MCH: 29.2 pg (ref 26.6–33.0)
MCHC: 34 g/dL (ref 31.5–35.7)
MCV: 86 fL (ref 79–97)
Platelets: 180 10*3/uL (ref 150–450)
RBC: 5.03 x10E6/uL (ref 3.77–5.28)
RDW: 14.6 % (ref 11.7–15.4)
WBC: 8.4 10*3/uL (ref 3.4–10.8)

## 2023-02-12 NOTE — Patient Instructions (Signed)
Medication Instructions:   Your physician recommends that you continue on your current medications as directed. Please refer to the Current Medication list given to you today.   *If you need a refill on your cardiac medications before your next appointment, please call your pharmacy*    Lab Work:  BMET AND CBC TODAY    If you have labs (blood work) drawn today and your tests are completely normal, you will receive your results only by: MyChart Message (if you have MyChart) OR A paper copy in the mail If you have any lab test that is abnormal or we need to change your treatment, we will call you to review the results.    Testing/Procedures:  NONE ORDERED  TODAY   Follow-Up: At [facility], you and your health needs are our priority.  As part of our continuing mission to provide you with exceptional heart care, we have created designated Provider Care Teams.  These Care Teams include your primary Cardiologist (physician) and Advanced Practice Providers (APPs -  Physician Assistants and Nurse Practitioners) who all work together to provide you with the care you need, when you need it.  We recommend signing up for the patient portal called "MyChart".  Sign up information is provided on this After Visit Summary.  MyChart is used to connect with patients for Virtual Visits (Telemedicine).  Patients are able to view lab/test results, encounter notes, upcoming appointments, etc.  Non-urgent messages can be sent to your provider as well.   To learn more about what you can do with MyChart, go to [email or web address].    Your next appointment:    1 year(s)   Provider:    You may see Will Jorja Loa, MD or one of the following Advanced Practice Providers on your designated Care Team:    Francis Dowse, PA-CMichael "Otilio Saber, [place]  Other Instructions

## 2023-02-19 DIAGNOSIS — R2689 Other abnormalities of gait and mobility: Secondary | ICD-10-CM | POA: Diagnosis not present

## 2023-02-19 DIAGNOSIS — R69 Illness, unspecified: Secondary | ICD-10-CM | POA: Diagnosis not present

## 2023-02-19 DIAGNOSIS — H903 Sensorineural hearing loss, bilateral: Secondary | ICD-10-CM | POA: Diagnosis not present

## 2023-02-19 DIAGNOSIS — D333 Benign neoplasm of cranial nerves: Secondary | ICD-10-CM | POA: Diagnosis not present

## 2023-02-27 DIAGNOSIS — H04123 Dry eye syndrome of bilateral lacrimal glands: Secondary | ICD-10-CM | POA: Diagnosis not present

## 2023-02-27 DIAGNOSIS — E119 Type 2 diabetes mellitus without complications: Secondary | ICD-10-CM | POA: Diagnosis not present

## 2023-02-27 DIAGNOSIS — H25813 Combined forms of age-related cataract, bilateral: Secondary | ICD-10-CM | POA: Diagnosis not present

## 2023-02-27 DIAGNOSIS — H43812 Vitreous degeneration, left eye: Secondary | ICD-10-CM | POA: Diagnosis not present

## 2023-02-27 DIAGNOSIS — G4733 Obstructive sleep apnea (adult) (pediatric): Secondary | ICD-10-CM | POA: Diagnosis not present

## 2023-03-05 DIAGNOSIS — D6869 Other thrombophilia: Secondary | ICD-10-CM | POA: Diagnosis not present

## 2023-03-05 DIAGNOSIS — E785 Hyperlipidemia, unspecified: Secondary | ICD-10-CM | POA: Diagnosis not present

## 2023-03-05 DIAGNOSIS — G4733 Obstructive sleep apnea (adult) (pediatric): Secondary | ICD-10-CM | POA: Diagnosis not present

## 2023-03-05 DIAGNOSIS — M5416 Radiculopathy, lumbar region: Secondary | ICD-10-CM | POA: Diagnosis not present

## 2023-03-05 DIAGNOSIS — E1151 Type 2 diabetes mellitus with diabetic peripheral angiopathy without gangrene: Secondary | ICD-10-CM | POA: Diagnosis not present

## 2023-03-05 DIAGNOSIS — I480 Paroxysmal atrial fibrillation: Secondary | ICD-10-CM | POA: Diagnosis not present

## 2023-03-05 DIAGNOSIS — I739 Peripheral vascular disease, unspecified: Secondary | ICD-10-CM | POA: Diagnosis not present

## 2023-03-05 LAB — HEMOGLOBIN A1C: A1c: 5.8

## 2023-03-07 NOTE — Progress Notes (Unsigned)
PATIENT: Erika Kim DOB: [birth year]  REASON FOR VISIT: follow up HISTORY FROM: patient PRIMARY NEUROLOGIST: Dr. Frances Furbish  Chief Complaint  Patient presents with   Follow-up    Rm 18, alone.   cpap follow up.  ESS 5.  Does not sleep as well.       HISTORY OF PRESENT ILLNESS: Today 03/08/23:  Erika Kim is a 77 y.o. female with a history of obstructive sleep apnea on CPAP. Returns today for follow-up.  Her CPAP download is below.  She reports that the CPAP continues to work well for her.  She states that some nights she does not sleep well but this is not always related to the machine.  Sometimes she has to get up several times at night to urinate.       REVIEW OF SYSTEMS: Out of a complete 14 system review of symptoms, the patient complains only of the following symptoms, and all other reviewed systems are negative.   ESS 5  ALLERGIES: Allergies  Allergen Reactions   Cephalexin Itching    Received 2 grams ancef 03/28/2022 without problem   Augmentin [Amoxicillin-Pot Clavulanate] Itching    severe     Cephalosporins Itching    severe    HOME MEDICATIONS: Outpatient Medications Prior to Visit  Medication Sig Dispense Refill   allopurinol (ZYLOPRIM) 100 MG tablet Take 100 mg by mouth daily.     atorvastatin (LIPITOR) 40 MG tablet Take 40 mg by mouth every morning.      betamethasone valerate (VALISONE) 0.1 % cream Apply 1 application. topically 2 (two) times a week.     Cholecalciferol (VITAMIN D3) 5000 units TABS Take 5,000 Units by mouth daily.      Cranberry-Vitamin C (CRANBERRY CONCENTRATE/VITAMINC PO) Take 1 tablet by mouth daily.      docusate sodium (COLACE) 100 MG capsule Take 100 mg by mouth 2 (two) times daily.     hydrochlorothiazide (HYDRODIURIL) 12.5 MG tablet Take 12.5 mg by mouth daily.      ipratropium (ATROVENT) 0.03 % nasal spray Place 2 sprays into both nostrils every 12 (twelve) hours as needed for rhinitis.     JANUVIA 100 MG tablet Take  100 mg by mouth daily after supper.      Lancets (ONETOUCH DELICA PLUS LANCET33G) MISC      metoprolol succinate (TOPROL-XL) 50 MG 24 hr tablet take 1 tablet by mouth in the morning and at bedtime with or immediately following a meal 180 tablet 2   Multiple Vitamin (MULTIVITAMIN) tablet Take 1 tablet by mouth daily.     olmesartan (BENICAR) 20 MG tablet Take 20 mg by mouth daily.     ONE TOUCH ULTRA TEST test strip      Polyethyl Glycol-Propyl Glycol (SYSTANE OP) Place 1 drop into both eyes daily as needed (dry eyes).     rivaroxaban (XARELTO) 20 MG TABS tablet TAKE ONE TABLET BY MOUTH ONE TIME DAILY WITH SUPPER 90 tablet 1   saline (AYR) GEL Place 1 application into the nose at bedtime.     triamcinolone cream (KENALOG) 0.1 % Apply 1 application. topically daily as needed for itching.     No facility-administered medications prior to visit.    PAST MEDICAL HISTORY: Past Medical History:  Diagnosis Date   Acoustic neuroma (HCC) followed by dr brown at [facility]   right side w/ chronic roaring noise   Anemia    hx of 2019 realted to Xarelto    Anticoagulant long-term  use    xarelto   Benign paroxysmal positional vertigo    Dysrhythmia    afib- 2 ablations followed by Dr Anne Fu - lov - 4/21    Endometrial polyp    Family history of adverse reaction to anesthesia    daughter- nausea    Frequency of urination    Hx of transfusion of packed red blood cells    related to small intestine in 2019    Hyperlipidemia    Hypertension    OA (osteoarthritis)    left knee   OSA on CPAP    cpap - setting at ? 8    PAF (paroxysmal atrial fibrillation) [facility]) cardiologist-  dr Anne Fu   dx 04/ 2017   PMB (postmenopausal bleeding)    TMJ (temporomandibular joint disorder)    left side-- wears guard   Type 2 diabetes mellitus (HCC)    Wears contact lenses     PAST SURGICAL HISTORY: Past Surgical History:  Procedure Laterality Date   ATRIAL FIBRILLATION ABLATION N/A 05/29/2019   Procedure:  ATRIAL FIBRILLATION ABLATION;  Surgeon: Regan Lemming, MD;  Location: [facility];  Service: Cardiovascular;  Laterality: N/A;   ATRIAL FIBRILLATION ABLATION N/A 10/29/2019   Procedure: ATRIAL FIBRILLATION ABLATION;  Surgeon: Regan Lemming, MD;  Location: [facility];  Service: Cardiovascular;  Laterality: N/A;   CARDIOVERSION N/A 06/24/2019   Procedure: CARDIOVERSION;  Surgeon: Chilton Si, MD;  Location: [facility] ENDOSCOPY;  Service: Cardiovascular;  Laterality: N/A;   CARPAL TUNNEL RELEASE Right 04/17/2007   w/ Excision ganglion cyst and Pulley Release right thumb   DILATATION & CURETTAGE/HYSTEROSCOPY WITH MYOSURE N/A 01/12/2017   Procedure: DILATATION & CURETTAGE/HYSTEROSCOPY WITH MYOSURE;  Surgeon: Richardean Chimera, MD;  Location: [facility];  Service: Gynecology;  Laterality: N/A;   ESOPHAGOGASTRODUODENOSCOPY (EGD) WITH PROPOFOL N/A 09/16/2018   Procedure: ESOPHAGOGASTRODUODENOSCOPY (EGD) WITH PROPOFOL;  Surgeon: Bernette Redbird, MD;  Location: [facility] ENDOSCOPY;  Service: Endoscopy;  Laterality: N/A;   GIVENS CAPSULE STUDY N/A 09/16/2018   Procedure: GIVENS CAPSULE STUDY;  Surgeon: Bernette Redbird, MD;  Location: [facility] ENDOSCOPY;  Service: Endoscopy;  Laterality: N/A;   HAMMER TOE SURGERY Left 2010   left second toe   IR RADIOLOGY PERIPHERAL GUIDED IV START  10/27/2019   IR US GUIDE VASC ACCESS RIGHT  10/27/2019   KNEE ARTHROSCOPY Right 2010   KNEE SURGERY Left 1993   LYMPH NODE BIOPSY N/A 03/01/2017   Procedure: SENTINEL LYMPH NODE BIOPSY;  Surgeon: Adolphus Birchwood, MD;  Location: [facility];  Service: Gynecology;  Laterality: N/A;   NASAL SINUS SURGERY  1985 and 1995   REVERSE SHOULDER ARTHROPLASTY Left 03/28/2022   Procedure: REVERSE SHOULDER ARTHROPLASTY;  Surgeon: Teryl Lucy, MD;  Location: [facility];  Service: Orthopedics;  Laterality: Left;   ROBOTIC ASSISTED TOTAL HYSTERECTOMY WITH BILATERAL SALPINGO OOPHERECTOMY Bilateral 03/01/2017   Procedure: XI  ROBOTIC ASSISTED TOTAL HYSTERECTOMY WITH BILATERAL SALPINGO OOPHORECTOMY;  Surgeon: Adolphus Birchwood, MD;  Location: [facility];  Service: Gynecology;  Laterality: Bilateral;   ROTATOR CUFF REPAIR Right 04/2004   TOE SURGERY Right 2012   Great toe and second toe   TOTAL ELBOW REPLACEMENT Right 2000   TOTAL KNEE ARTHROPLASTY Right 01/12/2010   TOTAL KNEE ARTHROPLASTY Left 03/02/2020   Procedure: TOTAL KNEE ARTHROPLASTY;  Surgeon: Sheral Apley, MD;  Location: [facility];  Service: Orthopedics;  Laterality: Left;   TRANSTHORACIC ECHOCARDIOGRAM  03/28/2016   ef 60-65%/ mild AR and MR   TUBAL LIGATION Bilateral 1978  ULNAR NERVE TRANSPOSITION Right 04/21/2021   Procedure: RIGHT ULNAR NERVE DECOMPRESSION ELBOW;  Surgeon: Betha Loa, MD;  Location:  SURGERY CENTER;  Service: Orthopedics;  Laterality: Right;  axillary block    FAMILY HISTORY: Family History  Problem Relation Age of Onset   Heart failure Mother    Heart failure Father    Sleep apnea Brother     SOCIAL HISTORY: Social History   Socioeconomic History   Marital status: Married    Spouse name: Not on file   Number of children: 2   Years of education: 12   Highest education level: Not on file  Occupational History   Not on file  Tobacco Use   Smoking status: Never    Passive exposure: Never   Smokeless tobacco: Never  Vaping Use   Vaping Use: Never used  Substance and Sexual Activity   Alcohol use: No   Drug use: No   Sexual activity: Yes  Other Topics Concern   Not on file  Social History Narrative   Married w/2 children (boy and girl).  Retired.   Social Determinants of Health   Financial Resource Strain: Not on file  Food Insecurity: Not on file  Transportation Needs: Not on file  Physical Activity: Not on file  Stress: Not on file  Social Connections: Not on file  Intimate Partner Violence: Not on file      PHYSICAL EXAM  Vitals:   03/08/23 0805  BP: 136/65  Pulse: (!) 57  Weight: 209 lb  (94.8 kg)  Height: 5' 2.5" (1.588 m)   Body mass index is 37.62 kg/m.  Generalized: Well developed, in no acute distress  Chest: Lungs clear to auscultation bilaterally  Neurological examination  Mentation: Alert oriented to time, place, history taking. Follows all commands speech and language fluent Cranial nerve II-XII: Facial symmetry noted   DIAGNOSTIC DATA (LABS, IMAGING, TESTING) - I reviewed patient records, labs, notes, testing and imaging myself where available.  Lab Results  Component Value Date   WBC 8.4 02/12/2023   HGB 14.7 02/12/2023   HCT 43.2 02/12/2023   MCV 86 02/12/2023   PLT 180 02/12/2023      Component Value Date/Time   NA 139 02/12/2023 0949   K 4.9 02/12/2023 0949   CL 102 02/12/2023 0949   CO2 27 02/12/2023 0949   GLUCOSE 100 (H) 02/12/2023 0949   GLUCOSE 117 (H) 03/29/2022 0331   BUN 18 02/12/2023 0949   CREATININE 0.68 02/12/2023 0949   CALCIUM 9.8 02/12/2023 0949   PROT 5.0 (L) 09/16/2018 0500   ALBUMIN 3.1 (L) 09/16/2018 0500   AST 18 09/16/2018 0500   ALT 20 09/16/2018 0500   ALKPHOS 29 (L) 09/16/2018 0500   BILITOT 2.7 (H) 09/16/2018 0500   GFRNONAA >60 03/29/2022 0331   GFRAA >60 02/23/2020 1101    Lab Results  Component Value Date   HGBA1C 5.9 (H) 03/17/2022   Lab Results  Component Value Date   VITAMINB12 222 09/17/2018   No results found for: "TSH"    ASSESSMENT AND PLAN 77 y.o. year old female  has a past medical history of Acoustic neuroma (HCC) (followed by dr brown at [facility]), Anemia, Anticoagulant long-term use, Benign paroxysmal positional vertigo, Dysrhythmia, Endometrial polyp, Family history of adverse reaction to anesthesia, Frequency of urination, transfusion of packed red blood cells, Hyperlipidemia, Hypertension, OA (osteoarthritis), OSA on CPAP, PAF (paroxysmal atrial fibrillation) (HCC) (cardiologist-  dr Anne Fu), PMB (postmenopausal bleeding), TMJ (temporomandibular joint disorder), Type 2  diabetes mellitus  (HCC), and Wears contact lenses. here with:  OSA on CPAP  - CPAP compliance excellent - Good treatment of AHI  - Encourage patient to use CPAP nightly and > 4 hours each night - F/U in 1 year or sooner if needed     Butch Penny, MSN, NP-C 03/08/2023, 8:51 AM [facility] [address] [place], [place] [address] [phone]

## 2023-03-08 ENCOUNTER — Encounter: Payer: Self-pay | Admitting: Adult Health

## 2023-03-08 ENCOUNTER — Ambulatory Visit: Payer: Medicare HMO | Admitting: Adult Health

## 2023-03-08 VITALS — BP 136/65 | HR 57 | Ht 62.5 in | Wt 209.0 lb

## 2023-03-08 DIAGNOSIS — G4733 Obstructive sleep apnea (adult) (pediatric): Secondary | ICD-10-CM

## 2023-03-08 NOTE — Patient Instructions (Signed)
Continue using CPAP nightly and greater than 4 hours each night °If your symptoms worsen or you develop new symptoms please let us know.  ° °

## 2023-03-30 DIAGNOSIS — G4733 Obstructive sleep apnea (adult) (pediatric): Secondary | ICD-10-CM | POA: Diagnosis not present

## 2023-04-10 DIAGNOSIS — H25811 Combined forms of age-related cataract, right eye: Secondary | ICD-10-CM | POA: Diagnosis not present

## 2023-04-28 DIAGNOSIS — G4733 Obstructive sleep apnea (adult) (pediatric): Secondary | ICD-10-CM | POA: Diagnosis not present

## 2023-05-15 DIAGNOSIS — H25811 Combined forms of age-related cataract, right eye: Secondary | ICD-10-CM | POA: Diagnosis not present

## 2023-05-15 DIAGNOSIS — H268 Other specified cataract: Secondary | ICD-10-CM | POA: Diagnosis not present

## 2023-05-21 DIAGNOSIS — H25812 Combined forms of age-related cataract, left eye: Secondary | ICD-10-CM | POA: Diagnosis not present

## 2023-05-29 DIAGNOSIS — G4733 Obstructive sleep apnea (adult) (pediatric): Secondary | ICD-10-CM | POA: Diagnosis not present

## 2023-06-05 DIAGNOSIS — H25812 Combined forms of age-related cataract, left eye: Secondary | ICD-10-CM | POA: Diagnosis not present

## 2023-06-05 DIAGNOSIS — H268 Other specified cataract: Secondary | ICD-10-CM | POA: Diagnosis not present

## 2023-06-13 DIAGNOSIS — S62632A Displaced fracture of distal phalanx of right middle finger, initial encounter for closed fracture: Secondary | ICD-10-CM | POA: Diagnosis not present

## 2023-06-13 DIAGNOSIS — M79644 Pain in right finger(s): Secondary | ICD-10-CM | POA: Diagnosis not present

## 2023-06-14 DIAGNOSIS — S62632A Displaced fracture of distal phalanx of right middle finger, initial encounter for closed fracture: Secondary | ICD-10-CM | POA: Diagnosis not present

## 2023-06-26 DIAGNOSIS — M25642 Stiffness of left hand, not elsewhere classified: Secondary | ICD-10-CM | POA: Diagnosis not present

## 2023-06-26 DIAGNOSIS — M79642 Pain in left hand: Secondary | ICD-10-CM | POA: Diagnosis not present

## 2023-06-26 DIAGNOSIS — S62632A Displaced fracture of distal phalanx of right middle finger, initial encounter for closed fracture: Secondary | ICD-10-CM | POA: Diagnosis not present

## 2023-06-26 DIAGNOSIS — M20012 Mallet finger of left finger(s): Secondary | ICD-10-CM | POA: Diagnosis not present

## 2023-06-26 DIAGNOSIS — S62633A Displaced fracture of distal phalanx of left middle finger, initial encounter for closed fracture: Secondary | ICD-10-CM | POA: Diagnosis not present

## 2023-06-27 DIAGNOSIS — L814 Other melanin hyperpigmentation: Secondary | ICD-10-CM | POA: Diagnosis not present

## 2023-06-27 DIAGNOSIS — L853 Xerosis cutis: Secondary | ICD-10-CM | POA: Diagnosis not present

## 2023-06-27 DIAGNOSIS — L570 Actinic keratosis: Secondary | ICD-10-CM | POA: Diagnosis not present

## 2023-06-27 DIAGNOSIS — D225 Melanocytic nevi of trunk: Secondary | ICD-10-CM | POA: Diagnosis not present

## 2023-06-27 DIAGNOSIS — L821 Other seborrheic keratosis: Secondary | ICD-10-CM | POA: Diagnosis not present

## 2023-06-29 DIAGNOSIS — G4733 Obstructive sleep apnea (adult) (pediatric): Secondary | ICD-10-CM | POA: Diagnosis not present

## 2023-07-10 DIAGNOSIS — S62632A Displaced fracture of distal phalanx of right middle finger, initial encounter for closed fracture: Secondary | ICD-10-CM | POA: Diagnosis not present

## 2023-07-10 DIAGNOSIS — M79642 Pain in left hand: Secondary | ICD-10-CM | POA: Diagnosis not present

## 2023-07-10 DIAGNOSIS — M25642 Stiffness of left hand, not elsewhere classified: Secondary | ICD-10-CM | POA: Diagnosis not present

## 2023-07-19 DIAGNOSIS — S62632A Displaced fracture of distal phalanx of right middle finger, initial encounter for closed fracture: Secondary | ICD-10-CM | POA: Diagnosis not present

## 2023-07-19 DIAGNOSIS — M79642 Pain in left hand: Secondary | ICD-10-CM | POA: Diagnosis not present

## 2023-07-19 DIAGNOSIS — M25642 Stiffness of left hand, not elsewhere classified: Secondary | ICD-10-CM | POA: Diagnosis not present

## 2023-07-27 DIAGNOSIS — G4733 Obstructive sleep apnea (adult) (pediatric): Secondary | ICD-10-CM | POA: Diagnosis not present

## 2023-08-01 DIAGNOSIS — M25642 Stiffness of left hand, not elsewhere classified: Secondary | ICD-10-CM | POA: Diagnosis not present

## 2023-08-01 DIAGNOSIS — S62632A Displaced fracture of distal phalanx of right middle finger, initial encounter for closed fracture: Secondary | ICD-10-CM | POA: Diagnosis not present

## 2023-08-01 DIAGNOSIS — M79642 Pain in left hand: Secondary | ICD-10-CM | POA: Diagnosis not present

## 2023-08-07 ENCOUNTER — Other Ambulatory Visit: Payer: Self-pay | Admitting: Cardiology

## 2023-08-08 DIAGNOSIS — S62633A Displaced fracture of distal phalanx of left middle finger, initial encounter for closed fracture: Secondary | ICD-10-CM | POA: Diagnosis not present

## 2023-08-08 DIAGNOSIS — M20012 Mallet finger of left finger(s): Secondary | ICD-10-CM | POA: Diagnosis not present

## 2023-08-08 NOTE — Telephone Encounter (Signed)
Prescription refill request for Xarelto received.  Indication: PAF Last office visit: 02/12/23  Almyra Free PA-C Weight: 92.6kg Age: 77 Scr: 0.68 on 02/12/23  Epic CrCl: 104.50  Based on above findings Xarelto 20mg  daily is the appropriate dose.  Refill approved.

## 2023-08-09 ENCOUNTER — Ambulatory Visit: Payer: Medicare HMO | Admitting: Podiatry

## 2023-08-15 DIAGNOSIS — M79642 Pain in left hand: Secondary | ICD-10-CM | POA: Diagnosis not present

## 2023-08-15 DIAGNOSIS — M25642 Stiffness of left hand, not elsewhere classified: Secondary | ICD-10-CM | POA: Diagnosis not present

## 2023-08-15 DIAGNOSIS — S62632A Displaced fracture of distal phalanx of right middle finger, initial encounter for closed fracture: Secondary | ICD-10-CM | POA: Diagnosis not present

## 2023-08-30 ENCOUNTER — Encounter: Payer: Self-pay | Admitting: Podiatry

## 2023-08-30 ENCOUNTER — Ambulatory Visit: Payer: Medicare HMO | Admitting: Podiatry

## 2023-08-30 DIAGNOSIS — M19072 Primary osteoarthritis, left ankle and foot: Secondary | ICD-10-CM | POA: Diagnosis not present

## 2023-08-30 DIAGNOSIS — M19071 Primary osteoarthritis, right ankle and foot: Secondary | ICD-10-CM

## 2023-08-30 MED ORDER — TRIAMCINOLONE ACETONIDE 40 MG/ML IJ SUSP
40.0000 mg | Freq: Once | INTRAMUSCULAR | Status: AC
Start: 2023-08-30 — End: 2023-08-30
  Administered 2023-08-30: 40 mg

## 2023-08-30 NOTE — Progress Notes (Signed)
Presents today for follow-up of her osteoarthritis midfoot bilateral.  Objective: Vital signs are stable alert and oriented x 3.  There is no erythema edema cellulitis drainage or odor.  Dorsal exostosis of the first through the fifth tarsometatarsal joints exquisitely painful bilateral frontal plane range of motion inversion and eversion exquisitely tender.  Assessment: Capsulitis osteoarthritis dorsal aspect bilateral.  Plan: Injection of the dorsal aspect of the bilateral foot Kenalog and local anesthetic.

## 2023-09-03 ENCOUNTER — Ambulatory Visit: Payer: Medicare HMO | Attending: Cardiology | Admitting: Cardiology

## 2023-09-03 ENCOUNTER — Encounter: Payer: Self-pay | Admitting: Cardiology

## 2023-09-03 VITALS — BP 158/76 | HR 59 | Ht 62.5 in | Wt 202.8 lb

## 2023-09-03 DIAGNOSIS — E7800 Pure hypercholesterolemia, unspecified: Secondary | ICD-10-CM

## 2023-09-03 DIAGNOSIS — I10 Essential (primary) hypertension: Secondary | ICD-10-CM

## 2023-09-03 DIAGNOSIS — G4733 Obstructive sleep apnea (adult) (pediatric): Secondary | ICD-10-CM | POA: Diagnosis not present

## 2023-09-03 DIAGNOSIS — R931 Abnormal findings on diagnostic imaging of heart and coronary circulation: Secondary | ICD-10-CM

## 2023-09-03 DIAGNOSIS — I480 Paroxysmal atrial fibrillation: Secondary | ICD-10-CM

## 2023-09-03 DIAGNOSIS — D6869 Other thrombophilia: Secondary | ICD-10-CM

## 2023-09-03 DIAGNOSIS — I483 Typical atrial flutter: Secondary | ICD-10-CM | POA: Diagnosis not present

## 2023-09-03 NOTE — Progress Notes (Signed)
Cardiology Office Note:  .   Date:  09/03/2023  ID:  Erika Kim, DOB [birth year], MRN [id number] PCP: Garlan Fillers, MD   HeartCare Providers Cardiologist:  Donato Schultz, MD Electrophysiologist:  Regan Lemming, MD     History of Present Illness: .   Erika Kim is a 77 y.o. female Discussed with the use of AI scribe software  History of Present Illness   The 77 year old patient with a history of atrial fibrillation, diagnosed in 2017, underwent ablation in August 2020 and a repeat ablation in 2021. She also had atrial flutter noted at the time of the first ablation and underwent a flutter ablation. Flecainide, started in 2019, was discontinued in April 2021.   The patient also has a history of acoustic neuroma with deafness, hypertension, hyperlipidemia, diabetes, and obstructive sleep apnea, which is treated with CPAP.   She has had no recurrence of GI bleeding and a recent colonoscopy was reassuring. She is on chronic anticoagulation with Xarelto 20 mg (worried about cost), monitored by Dr. Jarold Motto.   Recent labs showed hemoglobin of 12.1, creatinine of 0.68, hemoglobin A1c of 5.8, and triglycerides of 247. For hyperlipidemia, she takes atorvastatin 40 mg daily without myalgias.   For hypertension, she takes HCTZ 12.5 mg daily, metoprolol 50 mg daily, and Benicar 20 mg daily. For diabetes, she takes Januvia. The patient reports feeling well overall, with no new symptoms. She has been maintaining an active lifestyle, walking three miles every morning. Her blood pressure was slightly elevated at the visit, but she attributes this to walking in. She had a headache last week but checked her blood pressure at home, which was within normal limits.          Studies Reviewed: Marland Kitchen        ECHO 2017 - EF 65% Mild MR       Physical Exam:   VS:  BP (!) 158/76   Pulse (!) 59   Ht 5' 2.5" (1.588 m)   Wt 202 lb 12.8 oz (92 kg)   SpO2 97%   BMI 36.50 kg/m     Wt Readings from Last 3 Encounters:  09/03/23 202 lb 12.8 oz (92 kg)  03/08/23 209 lb (94.8 kg)  02/12/23 204 lb 3.2 oz (92.6 kg)    GEN: Well nourished, well developed in no acute distress NECK: No JVD; No carotid bruits CARDIAC: RRR, no murmurs, no rubs, no gallops RESPIRATORY:  Clear to auscultation without rales, wheezing or rhonchi  ABDOMEN: Soft, non-tender, non-distended EXTREMITIES:  No edema; No deformity   ASSESSMENT AND PLAN: .    Assessment and Plan    Atrial Fibrillation Diagnosed in 2017, underwent ablation in August 2020 and redo ablation in 2021. On chronic anticoagulation with Xarelto 20 mg daily. No recurrence of GI bleeding. Recent hemoglobin 12.1, creatinine 0.68. Discussed high cost of Xarelto and Medicare policies on drug pricing. No recent bleeding events, protected against strokes. - Continue Xarelto 20 mg daily - Annual follow-up  Hypertension Managed with HCTZ 12.5 mg daily, Metoprolol 50 mg daily, and Benicar 20 mg daily. Blood pressure slightly elevated today, likely due to recent activity. Monitors blood pressure at home. - Continue current antihypertensive regimen - Monitor blood pressure periodically at home  Hyperlipidemia Managed with atorvastatin 40 mg daily. Recent triglycerides 247. No myalgias reported. - Continue atorvastatin 40 mg daily  Diabetes Mellitus Type 2 Managed with Januvia. Recent hemoglobin A1c 5.8. Blood glucose levels well-controlled, recent readings 102-107. [place]  to keep blood glucose under 120. - Continue Januvia - Monitor blood glucose levels regularly  Obstructive Sleep Apnea Treated with CPAP. No new issues reported. - Continue CPAP therapy  General Health Maintenance Active, walking three miles daily. Diet includes vegetables, fruits, lean meats, minimal processed foods, no fast food consumption. - Encourage continued physical activity and healthy diet  Follow-up - Schedule annual follow-up appointment.                Signed, Donato Schultz, MD

## 2023-09-03 NOTE — Patient Instructions (Signed)

## 2023-09-05 DIAGNOSIS — E785 Hyperlipidemia, unspecified: Secondary | ICD-10-CM | POA: Diagnosis not present

## 2023-09-05 DIAGNOSIS — E1151 Type 2 diabetes mellitus with diabetic peripheral angiopathy without gangrene: Secondary | ICD-10-CM | POA: Diagnosis not present

## 2023-09-05 DIAGNOSIS — Z1212 Encounter for screening for malignant neoplasm of rectum: Secondary | ICD-10-CM | POA: Diagnosis not present

## 2023-09-05 DIAGNOSIS — I10 Essential (primary) hypertension: Secondary | ICD-10-CM | POA: Diagnosis not present

## 2023-09-06 DIAGNOSIS — Z1231 Encounter for screening mammogram for malignant neoplasm of breast: Secondary | ICD-10-CM | POA: Diagnosis not present

## 2023-09-10 DIAGNOSIS — Z1331 Encounter for screening for depression: Secondary | ICD-10-CM | POA: Diagnosis not present

## 2023-09-10 DIAGNOSIS — M19071 Primary osteoarthritis, right ankle and foot: Secondary | ICD-10-CM | POA: Diagnosis not present

## 2023-09-10 DIAGNOSIS — Z1339 Encounter for screening examination for other mental health and behavioral disorders: Secondary | ICD-10-CM | POA: Diagnosis not present

## 2023-09-10 DIAGNOSIS — M19072 Primary osteoarthritis, left ankle and foot: Secondary | ICD-10-CM | POA: Diagnosis not present

## 2023-09-10 DIAGNOSIS — I739 Peripheral vascular disease, unspecified: Secondary | ICD-10-CM | POA: Diagnosis not present

## 2023-09-10 DIAGNOSIS — M5416 Radiculopathy, lumbar region: Secondary | ICD-10-CM | POA: Diagnosis not present

## 2023-09-10 DIAGNOSIS — R82998 Other abnormal findings in urine: Secondary | ICD-10-CM | POA: Diagnosis not present

## 2023-09-10 DIAGNOSIS — G4733 Obstructive sleep apnea (adult) (pediatric): Secondary | ICD-10-CM | POA: Diagnosis not present

## 2023-09-10 DIAGNOSIS — I10 Essential (primary) hypertension: Secondary | ICD-10-CM | POA: Diagnosis not present

## 2023-09-10 DIAGNOSIS — I480 Paroxysmal atrial fibrillation: Secondary | ICD-10-CM | POA: Diagnosis not present

## 2023-09-10 DIAGNOSIS — Z0000 Encounter for general adult medical examination without abnormal findings: Secondary | ICD-10-CM | POA: Diagnosis not present

## 2023-09-10 DIAGNOSIS — D6869 Other thrombophilia: Secondary | ICD-10-CM | POA: Diagnosis not present

## 2023-09-10 DIAGNOSIS — H8110 Benign paroxysmal vertigo, unspecified ear: Secondary | ICD-10-CM | POA: Diagnosis not present

## 2023-09-10 DIAGNOSIS — E1151 Type 2 diabetes mellitus with diabetic peripheral angiopathy without gangrene: Secondary | ICD-10-CM | POA: Diagnosis not present

## 2023-09-12 DIAGNOSIS — M20012 Mallet finger of left finger(s): Secondary | ICD-10-CM | POA: Diagnosis not present

## 2023-09-12 DIAGNOSIS — S62633A Displaced fracture of distal phalanx of left middle finger, initial encounter for closed fracture: Secondary | ICD-10-CM | POA: Diagnosis not present

## 2023-10-28 DIAGNOSIS — G4733 Obstructive sleep apnea (adult) (pediatric): Secondary | ICD-10-CM | POA: Diagnosis not present

## 2023-11-14 DIAGNOSIS — J029 Acute pharyngitis, unspecified: Secondary | ICD-10-CM | POA: Diagnosis not present

## 2023-11-14 DIAGNOSIS — R051 Acute cough: Secondary | ICD-10-CM | POA: Diagnosis not present

## 2023-11-14 DIAGNOSIS — E119 Type 2 diabetes mellitus without complications: Secondary | ICD-10-CM | POA: Diagnosis not present

## 2023-11-14 DIAGNOSIS — R591 Generalized enlarged lymph nodes: Secondary | ICD-10-CM | POA: Diagnosis not present

## 2023-11-14 DIAGNOSIS — I10 Essential (primary) hypertension: Secondary | ICD-10-CM | POA: Diagnosis not present

## 2023-11-14 DIAGNOSIS — Z20822 Contact with and (suspected) exposure to covid-19: Secondary | ICD-10-CM | POA: Diagnosis not present

## 2024-01-08 DIAGNOSIS — M20032 Swan-neck deformity of left finger(s): Secondary | ICD-10-CM | POA: Diagnosis not present

## 2024-01-08 DIAGNOSIS — S62633A Displaced fracture of distal phalanx of left middle finger, initial encounter for closed fracture: Secondary | ICD-10-CM | POA: Diagnosis not present

## 2024-01-08 DIAGNOSIS — M20012 Mallet finger of left finger(s): Secondary | ICD-10-CM | POA: Diagnosis not present

## 2024-01-09 DIAGNOSIS — M79645 Pain in left finger(s): Secondary | ICD-10-CM | POA: Diagnosis not present

## 2024-01-09 DIAGNOSIS — M25642 Stiffness of left hand, not elsewhere classified: Secondary | ICD-10-CM | POA: Diagnosis not present

## 2024-01-23 DIAGNOSIS — M79642 Pain in left hand: Secondary | ICD-10-CM | POA: Diagnosis not present

## 2024-01-23 DIAGNOSIS — M25642 Stiffness of left hand, not elsewhere classified: Secondary | ICD-10-CM | POA: Diagnosis not present

## 2024-01-23 DIAGNOSIS — S62632A Displaced fracture of distal phalanx of right middle finger, initial encounter for closed fracture: Secondary | ICD-10-CM | POA: Diagnosis not present

## 2024-01-23 DIAGNOSIS — M79645 Pain in left finger(s): Secondary | ICD-10-CM | POA: Diagnosis not present

## 2024-01-28 DIAGNOSIS — S62633A Displaced fracture of distal phalanx of left middle finger, initial encounter for closed fracture: Secondary | ICD-10-CM | POA: Diagnosis not present

## 2024-01-28 DIAGNOSIS — M20032 Swan-neck deformity of left finger(s): Secondary | ICD-10-CM | POA: Diagnosis not present

## 2024-01-28 DIAGNOSIS — M20012 Mallet finger of left finger(s): Secondary | ICD-10-CM | POA: Diagnosis not present

## 2024-02-01 ENCOUNTER — Other Ambulatory Visit: Payer: Self-pay | Admitting: Cardiology

## 2024-02-01 DIAGNOSIS — I480 Paroxysmal atrial fibrillation: Secondary | ICD-10-CM

## 2024-02-01 NOTE — Telephone Encounter (Signed)
 Xarelto 20mg  refill request received. Pt is 78 years old, weight-92kg, Crea-0.68 on 02/12/23, last seen by Dr. Anne Fu on 09/03/23, Diagnosis-Afib, CrCl-102.22 mL/min; Dose is appropriate based on dosing criteria. Will send in refill to requested pharmacy.

## 2024-02-06 DIAGNOSIS — M25642 Stiffness of left hand, not elsewhere classified: Secondary | ICD-10-CM | POA: Diagnosis not present

## 2024-02-06 DIAGNOSIS — M79642 Pain in left hand: Secondary | ICD-10-CM | POA: Diagnosis not present

## 2024-02-06 DIAGNOSIS — M79645 Pain in left finger(s): Secondary | ICD-10-CM | POA: Diagnosis not present

## 2024-02-06 DIAGNOSIS — S62632A Displaced fracture of distal phalanx of right middle finger, initial encounter for closed fracture: Secondary | ICD-10-CM | POA: Diagnosis not present

## 2024-02-12 ENCOUNTER — Encounter: Payer: Self-pay | Admitting: Cardiology

## 2024-02-12 ENCOUNTER — Ambulatory Visit: Payer: Medicare HMO | Attending: Cardiology | Admitting: Cardiology

## 2024-02-12 VITALS — BP 124/74 | HR 63 | Ht 62.5 in | Wt 210.6 lb

## 2024-02-12 DIAGNOSIS — I480 Paroxysmal atrial fibrillation: Secondary | ICD-10-CM

## 2024-02-12 DIAGNOSIS — D6869 Other thrombophilia: Secondary | ICD-10-CM

## 2024-02-12 DIAGNOSIS — Z79899 Other long term (current) drug therapy: Secondary | ICD-10-CM

## 2024-02-12 DIAGNOSIS — I10 Essential (primary) hypertension: Secondary | ICD-10-CM | POA: Diagnosis not present

## 2024-02-12 MED ORDER — CHLORTHALIDONE 25 MG PO TABS: 25.0000 mg | ORAL_TABLET | Freq: Every day | ORAL | 3 refills | Status: DC

## 2024-02-12 NOTE — Progress Notes (Signed)
  Electrophysiology Office Note:   Date:  02/12/2024  ID:  Erika Kim, DOB September 23, 1946, MRN [id number]  Primary Cardiologist: Dorothye Gathers, MD Primary Heart Failure: None Electrophysiologist: Marcquis Ridlon Cortland Ding, MD      History of Present Illness:   Erika Kim is a 78 y.o. female with h/o atrial fibrillation, hypertension, hyperlipidemia, diabetes, sleep apnea seen today for routine electrophysiology followup.   Since last being seen in our clinic the patient reports doing overall well.  She notes a few short episodes of tachycardia noted on her watch, but she was minimally symptomatic.  She was sitting down and her heart rate was in the 120s.  This lasted for a very short period of time.  Aside from that, she has no acute complaints other than some lower extremity edema that has been there for a few weeks.  she denies chest pain, palpitations, dyspnea, PND, orthopnea, nausea, vomiting, dizziness, syncope, weight gain, or early satiety.   Review of systems complete and found to be negative unless listed in HPI.   EP Information / Studies Reviewed:    EKG is ordered today. Personal review as below.  EKG Interpretation Date/Time:  Tuesday February 12 2024 12:01:19 EDT Ventricular Rate:  63 PR Interval:  180 QRS Duration:  82 QT Interval:  384 QTC Calculation: 392 R Axis:   -22  Text Interpretation: Normal sinus rhythm Normal ECG When compared with ECG of 21-Nov-2019 08:39, No significant change since last tracing Confirmed by Kayleanna Lorman ([id number]) on 02/12/2024 12:06:54 PM     Risk Assessment/Calculations:    CHA2DS2-VASc Score = 5   This indicates a 7.2% annual risk of stroke. The patient's score is based upon: CHF History: 0 HTN History: 1 Diabetes History: 1 Stroke History: 0 Vascular Disease History: 0 Age Score: 2 Gender Score: 1             Physical Exam:   VS:  BP 124/74 (BP Location: Left Arm, Patient Position: Sitting, Cuff Size: Large)   Pulse 63   Ht 5'  2.5" (1.588 m)   Wt 210 lb 9.6 oz (95.5 kg)   SpO2 97%   BMI 37.91 kg/m    Wt Readings from Last 3 Encounters:  02/12/24 210 lb 9.6 oz (95.5 kg)  09/03/23 202 lb 12.8 oz (92 kg)  03/08/23 209 lb (94.8 kg)     GEN: Well nourished, well developed in no acute distress NECK: No JVD; No carotid bruits CARDIAC: Regular rate and rhythm, no murmurs, rubs, gallops RESPIRATORY:  Clear to auscultation without rales, wheezing or rhonchi  ABDOMEN: Soft, non-tender, non-distended EXTREMITIES:  1+ edema; No deformity   ASSESSMENT AND PLAN:    1.  Paroxysmal atrial fibrillation: Post ablation 2020 with repeat ablation 10/29/2019.  She remains in sinus rhythm.  She feels that she may have had some short recurrences.  She is happy with her control.  No changes.  2.  Obstructive sleep apnea: CPAP compliance encouraged  3.  Hypertension: Well-controlled.  She has lower extremity edema.  Percilla Tweten stop hydrochlorothiazide  and start chlorthalidone  25 mg daily.  4.  Obesity: Lifestyle modification encouraged  5.  Secondary hypercoagulable state: Currently on Xarelto  for atrial fibrillation  Follow up with EP APP in 12 months  Signed, Shakeela Rabadan Cortland Ding, MD

## 2024-02-12 NOTE — Patient Instructions (Signed)
 Medication Instructions:  Your physician has recommended you make the following change in your medication: STOP Hydrochlorothiazide  START Chlorthalidone  25 mg once daily  *If you need a refill on your cardiac medications before your next appointment, please call your pharmacy*   Lab Work: Your physician recommends that you return for lab work in: 2 weeks for BMP  If you have labs (blood work) drawn today and your tests are completely normal, you will receive your results only by: MyChart Message (if you have MyChart) OR A paper copy in the mail If you have any lab test that is abnormal or we need to change your treatment, we will call you to review the results.   Testing/Procedures: None ordered   Follow-Up: At [facility], you and your health needs are our priority.  As part of our continuing mission to provide you with exceptional heart care, we have created designated Provider Care Teams.  These Care Teams include your primary Cardiologist (physician) and Advanced Practice Providers (APPs -  Physician Assistants and Nurse Practitioners) who all work together to provide you with the care you need, when you need it.  We recommend signing up for the patient portal called "MyChart".  Sign up information is provided on this After Visit Summary.  MyChart is used to connect with patients for Virtual Visits (Telemedicine).  Patients are able to view lab/test results, encounter notes, upcoming appointments, etc.  Non-urgent messages can be sent to your provider as well.   To learn more about what you can do with MyChart, go to [email or web address].    Your next appointment:   1 year(s)  The format for your next appointment:   In Person  Provider:   You will see one of the following Advanced Practice Providers on your designated Care Team:   Mertha Abrahams, Kennard Pea [id number]" Hauser, PA-C Suzann Riddle, NP Creighton Doffing, NP    Thank you for choosing [facility]!!   Reece Cane, RN [phone]  Other Instructions

## 2024-02-19 DIAGNOSIS — G4733 Obstructive sleep apnea (adult) (pediatric): Secondary | ICD-10-CM | POA: Diagnosis not present

## 2024-02-20 DIAGNOSIS — M25642 Stiffness of left hand, not elsewhere classified: Secondary | ICD-10-CM | POA: Diagnosis not present

## 2024-02-20 DIAGNOSIS — M79645 Pain in left finger(s): Secondary | ICD-10-CM | POA: Diagnosis not present

## 2024-02-20 DIAGNOSIS — M79642 Pain in left hand: Secondary | ICD-10-CM | POA: Diagnosis not present

## 2024-02-20 DIAGNOSIS — S62632A Displaced fracture of distal phalanx of right middle finger, initial encounter for closed fracture: Secondary | ICD-10-CM | POA: Diagnosis not present

## 2024-02-28 ENCOUNTER — Ambulatory Visit: Payer: Medicare HMO | Admitting: Podiatry

## 2024-02-28 ENCOUNTER — Encounter: Payer: Self-pay | Admitting: Podiatry

## 2024-02-28 DIAGNOSIS — M19072 Primary osteoarthritis, left ankle and foot: Secondary | ICD-10-CM | POA: Diagnosis not present

## 2024-02-28 DIAGNOSIS — D6869 Other thrombophilia: Secondary | ICD-10-CM | POA: Insufficient documentation

## 2024-02-28 DIAGNOSIS — I10 Essential (primary) hypertension: Secondary | ICD-10-CM | POA: Diagnosis not present

## 2024-02-28 DIAGNOSIS — M722 Plantar fascial fibromatosis: Secondary | ICD-10-CM | POA: Insufficient documentation

## 2024-02-28 DIAGNOSIS — M19071 Primary osteoarthritis, right ankle and foot: Secondary | ICD-10-CM | POA: Diagnosis not present

## 2024-02-28 DIAGNOSIS — Z79899 Other long term (current) drug therapy: Secondary | ICD-10-CM | POA: Diagnosis not present

## 2024-02-28 MED ORDER — TRIAMCINOLONE ACETONIDE 40 MG/ML IJ SUSP
40.0000 mg | Freq: Once | INTRAMUSCULAR | Status: AC
Start: 2024-02-28 — End: 2024-02-28
  Administered 2024-02-28: 40 mg

## 2024-02-28 NOTE — Progress Notes (Signed)
 She presents today for follow-up of her osteoarthritis and dorsal capsulitis midfoot bilateral.  She states that her plantar fasciitis is doing good she states that she fell last Friday fell straight down on her but not affecting her foot or leg went to her doctor Dr. Donnita Gales she was mobic for 1 month for his recently been put on a new diuretic.  States that she is doing fine and that her blood sugar has been maintaining same levels.  Objective: Vital signs are stable and oriented x 3 pulses are palpable.  She has bilateral pitting edema in the legs +1.  She has some tenderness over the dorsal aspect of the bilateral foot with frontal plane range of motion being painful in the midfoot.  Assessment: Osteoarthritis midfoot bilateral dorsum.  Plan: Injected 10 mg of Kenalog  with 2 mg of dexamethasone  5 mg of Marcaine  bilaterally dorsal aspect of the bilateral foot we will follow-up with her in 6 months.  Call sooner if she has problems with that foot or leg.

## 2024-02-29 LAB — BASIC METABOLIC PANEL WITH GFR
BUN/Creatinine Ratio: 19 (ref 12–28)
BUN: 12 mg/dL (ref 8–27)
CO2: 25 mmol/L (ref 20–29)
Calcium: 9.7 mg/dL (ref 8.7–10.3)
Chloride: 97 mmol/L (ref 96–106)
Creatinine, Ser: 0.63 mg/dL (ref 0.57–1.00)
Glucose: 116 mg/dL — ABNORMAL HIGH (ref 70–99)
Potassium: 3.8 mmol/L (ref 3.5–5.2)
Sodium: 137 mmol/L (ref 134–144)
eGFR: 92 mL/min/{1.73_m2} (ref >59)

## 2024-03-06 NOTE — Progress Notes (Signed)
 Erika Kim

## 2024-03-10 ENCOUNTER — Ambulatory Visit: Payer: Medicare HMO | Admitting: Neurology

## 2024-03-10 ENCOUNTER — Encounter: Payer: Self-pay | Admitting: Neurology

## 2024-03-10 VITALS — BP 118/53 | HR 57 | Ht 62.0 in | Wt 208.0 lb

## 2024-03-10 DIAGNOSIS — R351 Nocturia: Secondary | ICD-10-CM

## 2024-03-10 DIAGNOSIS — G4733 Obstructive sleep apnea (adult) (pediatric): Secondary | ICD-10-CM

## 2024-03-10 NOTE — Progress Notes (Signed)
 Subjective:    Patient ID: Erika Kim is a 78 y.o. female.  HPI    Interim history:   [name] is a very pleasant 78 year old right-handed woman with an underlying medical history of hyperlipidemia, acoustic neuroma, hypertension, DM, recurrent headaches, positional vertigo, left shoulder pain, and sinus congestion, status post sinus surgery, left knee surgery, tubal ligation, right rotator cuff surgery, right total knee replacement surgery, right great toe surgery, status post hysterectomy in 2018, paroxysmal A. fib with status post cardioversion in September 2020, who presents for follow-up consultation of her obstructive sleep apnea, on CPAP therapy.  The patient is unaccompanied today.  She was last seen in our clinic by Colby Daub, NP on 03/08/2019 for, at which time she was compliant with her CPAP of 8 cm.  She reported not sleeping as well.  Today, 03/10/2024: I reviewed her CPAP compliance data from 02/09/2004 through 03/09/2020 which is total of 30 days, during which time she used her machine every night with percent use days greater than 4 hours at 100%, indicating support, findings, average usage of 6 is not 39 minutes residual AHI.  0.9/h, leak acceptable with the 95th percentile at 7.7 L/min on a pressure of 8 cm with EPR of 2.  She reports overall doing well with her CPAP.  She is up-to-date with her supplies.  Her primary care changed her diuretic from hydrochlorothiazide  to chlorthalidone  to improve her lower extremity swelling which is better.  She has intermittent dull headaches in the afternoon sometimes and blood pressure tends to fluctuate quite a bit.  She has not had any sudden onset of one-sided weakness or numbness or tingling or droopy face or slurring of speech.  Sometimes she has a tremor at night but not always and it does not typically bother her.  Her husband has noticed it.  She has fallen twice within the past year but does not use a cane or walking aid.  1 fall  happened [place] the [place]wash when she slipped.  The patient's allergies, current medications, family history, past medical history, past social history, past surgical history and problem list were reviewed and updated as appropriate.     Previously:  03/08/23 Clem Currier, NP): <<Erika Kim is a 78 y.o. female with a history of obstructive sleep apnea on CPAP. Returns today for follow-up.  Her CPAP download is below.  She reports that the CPAP continues to work well for her.  She states that some nights she does not sleep well but this is not always related to the machine.  Sometimes she has to get up several times at night to urinate.>>  03/02/2022: I reviewed her CPAP compliance data from 12/30/2021 through 01/28/2022, which is a total of 30 days, during which time she used her machine every night with percent use days greater than 4 hours at 100%, indicating superb compliance with an average usage of 7 hours and 43 minutes, residual AHI at goal at 1.5/h, leak on the low side with the 95th percentile at 3.2 L/min on a pressure of 8 cm with EPR of 2.  She reports doing well with her new machine, no new issues.  She uses nasal pillows, sometimes she has nasal dryness and uses saline gel.  She tries to hydrate well.  She has more issues with her left shoulder and may need a shoulder replacement, she sees Dr. Agatha Horsfall.  She had right shoulder arthroscopic surgery some 20 years ago.  She has a checkup today  with her primary care for diabetes control. She feels well rested typically.  Sometimes she has trouble going to sleep.  She would rather not try any medication for fear of side effects or interaction with her Xarelto .  She has not tried melatonin. She is s/p left total knee replacement on 03/02/2020. She had A. fib ablation on 10/29/2019. She had a right total knee replacement some 12 years ago.   I saw her on 09/01/2020, at which time she was compliant with her CPAP and doing well.     She requested a new  machine in the interim which I prescribed. Her set up date was 12/30/2021 through [employer].  She has a ResMed air sense 11 AutoSet machine.     I saw her on 09/02/19, at which time she was compliant with her CPAP and doing well.  She had a cardiac ablation in August 2020 and cardioversion in September 2020.   I reviewed her CPAP compliance data from 07/31/2020 through 08/29/2020, which is a total of 30 days, during which time she used her machine every night with percent use days greater than 4 hours at 100%, indicating superb compliance with an average usage of 7 hours and 23 minutes, residual AHI at goal at 0.7/h, leak on the low side with a 95th percentile at 3.2 L/min on a pressure of 8 cm with EPR of 2.  Set up date was 02/25/2014.     I saw her on 08/27/2017, at which time she was doing well with CPAP and fully compliant.    She saw Clem Currier, in the interim, on 08/27/2018, at which time she was fully compliant with CPAP and advised to follow-up in 1 year.    I reviewed her CPAP compliance data from 08/02/2019 through 08/31/2019 which is a total of 30 days, during which time she used her machine every night with percent use days greater than 4 hours at 100%, indicating superb compliance with an average usage of 7 hours and 47 minutes, residual AHI at goal at 0.8/h, leak is low with a 95th percentile at 4.7 L/min on a pressure of 8 cm with EPR of 2.       I saw her on 08/22/2016, at which time she reported doing well. She was fully compliant with CPAP. She had no recent episode of headache or vertigo. She had a bout of A. fib some 6 months prior. She went to the ER and was diagnosed with paroxysmal A. fib, started on Xarelto  and had a follow-up with cardiology.    I reviewed her CPAP compliance data from 07/28/2017 through 08/26/2017 which is a total of 30 days, during which time she used her machine every night with percent used days greater than 4 hours at 100%, indicating superb compliance with an  average usage of 7 hours and 39 minutes, residual AHI at goal at 1.2 per hour, leak on the low side with the 95th percentile at 3.1 L/m on a pressure of 8 cm with EPR of 2.    I saw her on 09/02/2015 for vertigo. She had seen Dr. Franklin Ito on 07/20/2015 and he voiced concern that she may have vertiginous migraines. She reported 3 episodes of vertigo in 2016. She had some headaches which are infrequent, some associated with nausea but no significant photophobia and she did report that it would help to lie down and rest and she will take over-the-counter Advil . She felt that her headaches actually improved after she started CPAP therapy  and she was fully compliant with CPAP. Her exam is nonfocal at the time and she followed regularly with ENT. I suggested she continue with as needed Aleve for headaches and talked to her ENT about potentially pursuing vestibular rehabilitation for vertigo. She was fully compliant with CPAP therapy. I suggested a one-year checkup.   I reviewed her CPAP compliance data from 07/22/2016 through 08/20/2016 which is a total of 30 days, during which time she used her machine every night with percent used days greater than 4 hours at 100%, indicating superb compliance with an average usage of 7 hours and 36 minutes, residual AHI 1.3 per hour, leaked low with the 95th percentile at 6.1 L/m on a pressure of 8 cm with EPR of 2.    I saw her on 10/08/2014, at which time she was doing well on CPAP therapy with full compliance and great results. She had a recent brain MRI for her acoustic neuroma at the time and her MRI was felt to be stable per her report.   I reviewed her CPAP compliance data from 07/17/2015 through 08/15/2015 which is a total of 30 days during which time she used her machine every day with percent used days greater than 100%, indicating superb compliance with an average usage of 7 hours and 27 minutes, residual AHI low at 1.3 per hour, leak low with the 95th percentile at  4 L/m on a pressure of 8 cm with EPR of 2.   I saw her on 04/08/2014, at which time she reported adjusting well to CPAP and endorse sleeping deeply and without is many interruptions. She felt memory was stable. She had started her diabetes medication.   I reviewed her compliance data from 09/05/2014 to 10/04/2014 which is a total of 30 days during which time she used her machine every night with percent used days greater than 4 hours of 100%, indicating superb compliance. Average usage of 7 hours and 9 minutes, residual AHI low at 1.8 per hour and leak low at 6 L/m for the 95th percentile with the pressure currently at 8 cm with EPR of 2.   I saw her on 10/09/2013, at which time we discussed her recent baseline sleep study which confirmed mild to moderate sleep apnea. I felt that because of her underlying sleep related complaints and her medical history she would benefit from treatment with CPAP and suggested a repeat sleep study with CPAP titration. She had the study on 01/26/2014 and I went over her test results with her in detail today. Sleep efficiency was markedly reduced at 47.1% with a long latency to sleep of 48.5 minutes and wake after sleep onset of 165 minutes with moderate sleep fragmentation noted. She had an increased percentage of slow-wave sleep and a normal percentage of REM sleep with a prolonged REM latency. She had no significant periodic leg movements of sleep and no significant EKG changes. She had a baseline oxygen  saturation of 93% with a nadir of 84%. Snoring was eliminated with CPAP which was started at 5 cm and titrated to 8 cm with elimination of her sleep disordered breathing and her snoring. REM sleep was achieved supine REM sleep was not achieved. Based on her test results I prescribed CPAP for her.   I reviewed the patient's CPAP compliance data from 03/08/2014 to 04/06/2014, which is a total of 30 days, during which time the patient used CPAP every day. The average usage for  all days was 7 hours and 21 minutes.  The percent used days greater than 4 hours was 100 %, indicating superb compliance. The residual AHI was low at 1.5 per hour, indicating an appropriate treatment pressure of 8 cwp with EPR of 2. Air leak from the mask was low with the 95th percentile at 5.4 L per minute.   I first met her on 08/08/2013 at which time she complained of chronic poor quality sleep, snoring, and recent onset of memory problems, as well as witnessed apneas.    She had a baseline sleep study on 09/04/2013. Her sleep efficiency was reduced at 66.1% with a prolonged latency to sleep of 34.5 minutes. Wake after sleep onset was 91 minutes with moderate sleep fragmentation noted. She had an increased percentage of stage I and stage II sleep, and near normal percentage of deep sleep and a decreased percentage of REM sleep at 13.3% with a high normal REM latency of 117 minutes. She had no significant periodic leg movements of sleep. She had moderate to at times loud snoring. She had a total AHI of 6.4 per hour with further elevation to 20.9 per hour in the supine position. Her baseline oxygen  saturation was 93%, her nadir was 88%. She spent 10 minutes and 14 seconds below the saturation of 90% for the night.   We called her with the test results in the interim and asked her to consider returning for CPAP titration study and she reported that she would like to discuss this with her husband first, which is why I saw her back in follow up.      Her Past Medical History Is Significant For: Past Medical History:  Diagnosis Date   Acoustic neuroma (HCC) followed by dr brown at [facility]   right side w/ chronic roaring noise   Anemia    hx of 2019 realted to Xarelto     Anticoagulant long-term use    xarelto    Benign paroxysmal positional vertigo    Dysrhythmia    afib- 2 ablations followed by Dr Renna Cary - [facility] - 4/21    Endometrial polyp    Family history of adverse reaction to anesthesia     daughter- nausea    Frequency of urination    Hx of transfusion of packed red blood cells    related to small intestine in 2019    Hyperlipidemia    Hypertension    OA (osteoarthritis)    left knee   OSA on CPAP    cpap - setting at ? 8    PAF (paroxysmal atrial fibrillation) [facility]) cardiologist-  dr Renna Cary   dx 04/ 2017   PMB (postmenopausal bleeding)    TMJ (temporomandibular joint disorder)    left side-- wears guard   Type 2 diabetes mellitus (HCC)    Wears contact lenses     Her Past Surgical History Is Significant For: Past Surgical History:  Procedure Laterality Date   ATRIAL FIBRILLATION ABLATION N/A 05/29/2019   Procedure: ATRIAL FIBRILLATION ABLATION;  Surgeon: Lei Pump, MD;  Location: [facility] IN[facility]IVE [facility];  Service: Cardiovascular;  Laterality: N/A;   ATRIAL FIBRILLATION ABLATION N/A 10/29/2019   Procedure: ATRIAL FIBRILLATION ABLATION;  Surgeon: Lei Pump, MD;  Location: [facility] CV [facility];  Service: Cardiovascular;  Laterality: N/A;   CARDIOVERSION N/A 06/24/2019   Procedure: CARDIOVERSION;  Surgeon: Maudine Sos, MD;  Location: [facility] ENDOSCOPY;  Service: Cardiovascular;  Laterality: N/A;   CARPAL TUNNEL RELEASE Right 04/17/2007   w/ Excision ganglion cyst and Pulley Release right thumb  DILATATION & CURETTAGE/HYSTEROSCOPY WITH MYOSURE N/A 01/12/2017   Procedure: DILATATION & CURETTAGE/HYSTEROSCOPY WITH MYOSURE;  Surgeon: Merryl Abraham, MD;  Location: [facility];  Service: Gynecology;  Laterality: N/A;   ESOPHAGOGASTRODUODENOSCOPY (EGD) WITH PROPOFOL  N/A 09/16/2018   Procedure: ESOPHAGOGASTRODUODENOSCOPY (EGD) WITH PROPOFOL ;  Surgeon: Lanita Pitman, MD;  Location: [facility] ENDOSCOPY;  Service: Endoscopy;  Laterality: N/A;   GIVENS CAPSULE STUDY N/A 09/16/2018   Procedure: GIVENS CAPSULE STUDY;  Surgeon: Lanita Pitman, MD;  Location: [facility] ENDOSCOPY;  Service: Endoscopy;  Laterality: N/A;   HAMMER TOE SURGERY Left 2010   left second toe    IR RADIOLOGY PERIPHERAL GUIDED IV START  10/27/2019   IR US  GUIDE VASC ACCESS RIGHT  10/27/2019   KNEE ARTHROSCOPY Right 2010   KNEE SURGERY Left 1993   LYMPH NODE BIOPSY N/A 03/01/2017   Procedure: SENTINEL LYMPH NODE BIOPSY;  Surgeon: Alphonso Aschoff, MD;  Location: [facility];  Service: Gynecology;  Laterality: N/A;   NASAL SINUS SURGERY  1985 and 1995   REVERSE SHOULDER ARTHROPLASTY Left 03/28/2022   Procedure: REVERSE SHOULDER ARTHROPLASTY;  Surgeon: Osa Blase, MD;  Location: [facility];  Service: Orthopedics;  Laterality: Left;   ROBOTIC ASSISTED TOTAL HYSTERECTOMY WITH BILATERAL SALPINGO OOPHERECTOMY Bilateral 03/01/2017   Procedure: XI ROBOTIC ASSISTED TOTAL HYSTERECTOMY WITH BILATERAL SALPINGO OOPHORECTOMY;  Surgeon: Alphonso Aschoff, MD;  Location: [facility];  Service: Gynecology;  Laterality: Bilateral;   ROTATOR CUFF REPAIR Right 04/2004   TOE SURGERY Right 2012   Great toe and second toe   TOTAL ELBOW REPLACEMENT Right 2000   TOTAL KNEE ARTHROPLASTY Right 01/12/2010   TOTAL KNEE ARTHROPLASTY Left 03/02/2020   Procedure: TOTAL KNEE ARTHROPLASTY;  Surgeon: Saundra Curl, MD;  Location: [facility];  Service: Orthopedics;  Laterality: Left;   TRANSTHORACIC ECHOCARDIOGRAM  03/28/2016   ef 60-65%/ mild AR and MR   TUBAL LIGATION Bilateral 1978   ULNAR NERVE TRANSPOSITION Right 04/21/2021   Procedure: RIGHT ULNAR NERVE DECOMPRESSION ELBOW;  Surgeon: Brunilda Capra, MD;  Location: [facility];  Service: Orthopedics;  Laterality: Right;  axillary block    Her Family History Is Significant For: Family History  Problem Relation Age of Onset   Heart failure Mother    Heart failure Father    Sleep apnea Brother     Her Social History Is Significant For: Social History   Socioeconomic History   Marital status: Married    Spouse name: Not on file   Number of children: 2   Years of education: 12   Highest education level: Not on file  Occupational History   Not on file  Tobacco  Use   Smoking status: Never    Passive exposure: Never   Smokeless tobacco: Never  Vaping Use   Vaping status: Never Used  Substance and Sexual Activity   Alcohol  use: No   Drug use: No   Sexual activity: Yes  Other Topics Concern   Not on file  Social History Narrative   Married w/2 children (boy and girl).  Retired.   Social Drivers of Corporate investment banker Strain: Not on file  Food Insecurity: Low Risk  (01/08/2024)   Received from [facility]   Hunger Vital Sign    Worried About Running Out of Food in the Last Year: Never true    Ran Out of Food in the Last Year: Never true  Transportation Needs: No Transportation Needs (01/08/2024)   Received from [facility]  In the past 12 months, has lack of reliable transportation kept you from medical appointments, meetings, work or from getting things needed for daily living? : No  Physical Activity: Not on file  Stress: Not on file  Social Connections: Not on file    Her Allergies Are:  Allergies  Allergen Reactions   Cephalexin Itching    Received 2 grams ancef  03/28/2022 without problem   Augmentin [Amoxicillin-Pot Clavulanate] Itching    severe     Cephalosporins Itching    severe  :   Her Current Medications Are:  Outpatient Encounter Medications as of 03/10/2024  Medication Sig   allopurinol (ZYLOPRIM) 100 MG tablet Take 100 mg by mouth daily.   atorvastatin  (LIPITOR) 40 MG tablet Take 40 mg by mouth every morning.    betamethasone valerate (VALISONE) 0.1 % cream Apply 1 application. topically 2 (two) times a week.   chlorthalidone  (HYGROTON ) 25 MG tablet Take 1 tablet (25 mg total) by mouth daily.   Cholecalciferol (VITAMIN D3) 5000 units TABS Take 5,000 Units by mouth daily.    Cranberry-Vitamin C (CRANBERRY CONCENTRATE/VITAMINC PO) Take 1 tablet by mouth daily.    docusate sodium  (COLACE) 100 MG capsule Take 100 mg by mouth 2 (two) times daily.   ipratropium (ATROVENT ) 0.03 % nasal  spray Place 2 sprays into both nostrils every 12 (twelve) hours as needed for rhinitis.   JANUVIA 100 MG tablet Take 100 mg by mouth daily after supper.    Lancets (ONETOUCH DELICA PLUS LANCET33G) MISC    metoprolol  succinate (TOPROL -XL) 50 MG 24 hr tablet take 1 tablet by mouth in the morning and at bedtime with or immediately following a meal   Multiple Vitamin (MULTIVITAMIN) tablet Take 1 tablet by mouth daily.   olmesartan (BENICAR) 20 MG tablet Take 20 mg by mouth daily.   ONE TOUCH ULTRA TEST test strip    Polyethyl Glycol-Propyl Glycol (SYSTANE OP) Place 1 drop into both eyes daily as needed (dry eyes).   rivaroxaban  (XARELTO ) 20 MG TABS tablet TAKE ONE TABLET BY MOUTH ONCE A DAY WITH SUPPER   saline (AYR) GEL Place 1 application into the nose at bedtime.   No facility-administered encounter medications on file as of 03/10/2024.  :  Review of Systems:  Out of a complete 14 point review of systems, all are reviewed and negative with the exception of these symptoms as listed below:  Review of Systems  Neurological:        Pt here for cpap f/u Pt states increased headaches, and tremors in both hands while sleeping Pt states 2 falls in last year    FSS:18 ESS:9    Objective:  Neurological Exam  Physical Exam Physical Examination:   Vitals:   03/10/24 1320  BP: (!) 118/53  Pulse: (!) 57    General Examination: The patient is a very pleasant 78 y.o. female in no acute distress. She appears well-developed and well-nourished and well groomed.   HEENT: Normocephalic, atraumatic, pupils are equal, round and reactive to light, extraocular tracking is preserved. Hearing is grossly intact. Face is symmetric with normal facial animation. Speech is clear with no dysarthria noted, slight intermittent voice trembling, stable. There is no hypophonia. There is no lip, neck/head, or jaw tremor.   Neck with full range of motion.  There are no carotid bruits on auscultation. Oropharynx exam  reveals: mild to moderate mouth dryness, adequate dental hygiene and mild airway crowding. Tongue protrudes centrally and palate elevates symmetrically. Tonsils are absent.  Chest: Clear to auscultation without wheezing, rhonchi or crackles noted.   Heart: S1+S2+0, regular and normal without murmurs, rubs or gallops noted.    Abdomen: Soft, non-tender and non-distended.   Extremities: There is trace edema bilateral LEs.    Skin: Warm and dry without trophic changes noted.    Musculoskeletal: exam reveals no obvious joint deformities, she is s/p bilateral knee replacement surgeries with unremarkable scars noted.     Neurologically:  Mental status: The patient is awake, alert and oriented in all 4 spheres. Her memory, attention, language and knowledge are appropriate. There is no aphasia, agnosia, apraxia or anomia. Speech is clear with normal prosody and enunciation. Thought process is linear. Mood is congruent and affect is normal.  Cranial nerves are as described above under HEENT exam. Motor exam: Normal bulk, strength and tone is noted. There is no postural or action tremor. Fine motor skills are grossly intact.  Cerebellar testing shows no dysmetria or intention tremor.  Sensory exam is intact to light touch in the upper and lower extremities.  Gait, station and balance: She stands without problems. Posture is age-appropriate and gait is unremarkable, but walks slowly. No walking aid.      Assessment and Plan:    In summary, Erika Kim is a very pleasant 78 year old female with an underlying medical history of hyperlipidemia, PAF (on Xarelto ), with s/p cardiac ablation in 01/21, status post cardioversion, Hx of acoustic neuroma (followed by Dr. Bevin Bucks at [facility]), hypertension, DM, recurrent headaches, positional vertigo, left shoulder pain, and sinus congestion, status multiple surgeries including sinus surgery, left knee surgery, tubal ligation, right rotator cuff  surgery, right total knee replacement surgery, right great toe surgery, and L TKA in 05/21, and obesity, who presents for follow-up consultation of her obstructive sleep apnea, on CPAP therapy with a set pressure of 8 cm with EPR of 2 via nasal pillows. She has full compliance and ongoing good results. She established with a new CPAP machine in March 2023. She has had some trouble staying asleep, due to nocturia. Physical exam and neurological exam are stable and nonfocal.  She is doing well.  She is advised to follow-up routinely in 1 year to see one of our nurse practitioners.  I answered all her questions today and she was in agreement.

## 2024-03-11 DIAGNOSIS — M20032 Swan-neck deformity of left finger(s): Secondary | ICD-10-CM | POA: Diagnosis not present

## 2024-03-11 DIAGNOSIS — M20012 Mallet finger of left finger(s): Secondary | ICD-10-CM | POA: Diagnosis not present

## 2024-03-11 DIAGNOSIS — S62633A Displaced fracture of distal phalanx of left middle finger, initial encounter for closed fracture: Secondary | ICD-10-CM | POA: Diagnosis not present

## 2024-03-24 ENCOUNTER — Ambulatory Visit: Admission: EM | Admit: 2024-03-24 | Discharge: 2024-03-24 | Disposition: A | Discharge status: Home / Self Care

## 2024-03-24 DIAGNOSIS — R251 Tremor, unspecified: Secondary | ICD-10-CM | POA: Diagnosis not present

## 2024-03-24 DIAGNOSIS — L600 Ingrowing nail: Secondary | ICD-10-CM | POA: Diagnosis not present

## 2024-03-24 MED ORDER — DOXYCYCLINE HYCLATE 100 MG PO TABS: 100.0000 mg | ORAL_TABLET | Freq: Two times a day (BID) | ORAL | 0 refills | Status: DC

## 2024-03-24 MED ORDER — MUPIROCIN 2 % EX OINT: 1.0000 | TOPICAL_OINTMENT | Freq: Two times a day (BID) | CUTANEOUS | 0 refills | Status: AC

## 2024-03-24 NOTE — Discharge Instructions (Signed)
 It is unclear what is causing the shaking episodes.  We are checking some basic labs and contact you if urgent follow-up is needed.  Please follow-up with your doctor who manages these medications for further guidance.  If your husband suspects you are having a seizure, please have him call 911.  You have an ingrown toenail to your left foot.  Take the doxycycline  twice daily for the next 7 days.  Soak the foot in warm water  with antibacterial solution such as Hibiclens  or Epsom salt.  You can do this twice daily.  Afterwards clean and dry the foot completely and apply the topical antibacterial ointment.  Follow-up with your podiatrist within the next week for potential removal.  Return to clinic for new or urgent symptoms.

## 2024-03-24 NOTE — ED Triage Notes (Addendum)
 Pt c/o ingrown toenail on left middle toe for about 1 week.  States her MD also changed her bp medication and since then her husband has seen has shaking during the night

## 2024-03-24 NOTE — ED Provider Notes (Signed)
 [facility]    CSN: [id number] Arrival date & time: 03/24/24  1435      History   Chief Complaint Chief Complaint  Patient presents with   Nail Problem    HPI Erika Kim is a 78 y.o. female.   Patient presents to clinic over concern of a potential infected nail to her left foot.  She has also had a few episodes of shaking throughout the night for the past 3 or 4 nights.  She did recently get her nails done at the beginning of May at a salon.  Noticed over the past week she has had erythema and redness with slight drainage to her fourth left toe.  She has been soaking the area in warm water  with Epsom salt without much improvement.  Has not had any drainage.  She is a type II diabetic and follows with podiatry.  Recently one of her providers changed her from hydrochlorothiazide  to chlorthalidone .  Her husband has noticed the past few nights she is having episodes where she is shaking throughout the night.  He is worried that she may be having seizures.  Patient would not have any incontinence that she would not be postictal.  Patient provides her history, she presents to clinic alone.    The history is provided by the patient and medical records.    Past Medical History:  Diagnosis Date   Acoustic neuroma (HCC) followed by dr brown at [facility]   right side w/ chronic roaring noise   Anemia    hx of 2019 realted to Xarelto     Anticoagulant long-term use    xarelto    Benign paroxysmal positional vertigo    Dysrhythmia    afib- 2 ablations followed by Dr Renna Cary - lov - 4/21    Endometrial polyp    Family history of adverse reaction to anesthesia    daughter- nausea    Frequency of urination    Hx of transfusion of packed red blood cells    related to small intestine in 2019    Hyperlipidemia    Hypertension    OA (osteoarthritis)    left knee   OSA on CPAP    cpap - setting at ? 8    PAF (paroxysmal atrial fibrillation) [facility]) cardiologist-  dr Renna Cary    dx 04/ 2017   PMB (postmenopausal bleeding)    TMJ (temporomandibular joint disorder)    left side-- wears guard   Type 2 diabetes mellitus (HCC)    Wears contact lenses     Patient Active Problem List   Diagnosis Date Noted   Acquired thrombophilia (HCC) 02/28/2024   Plantar fasciitis of right foot 02/28/2024   Anemia 02/06/2023   Obstructive sleep apnea syndrome 02/06/2023   S/P reverse total shoulder arthroplasty, left 03/28/2022   Elevated coronary artery calcium  score 02/20/2022   Essential hypertension 08/26/2021   Primary osteoarthritis of left knee 02/06/2020   Secondary hypercoagulable state (HCC) 11/21/2019   Lumbar radiculopathy 10/09/2019   Hyperlipidemia 07/09/2019   Typical atrial flutter (HCC) 06/20/2019   Atrial fibrillation (HCC) 05/29/2019   Arthralgia of left knee 02/24/2019   Encounter for general adult medical examination without abnormal findings 02/24/2019   Acute posthemorrhagic anemia 09/24/2018   Chronic anticoagulation 09/16/2018   GI bleed 09/15/2018   Disequilibrium 06/04/2018   Pain in left foot 05/01/2017   PD (perceptive deafness), asymmetrical 02/12/2017   Diabetes (HCC) 02/12/2017   Endometrial cancer (HCC) 02/12/2017   Endometrial polyp 01/12/2017  Class: Present on Admission   Headache 06/29/2016   Tear film insufficiency 06/29/2016   Paroxysmal atrial fibrillation (HCC) 03/13/2016   Benign neoplasm of cranial nerve (HCC) 08/19/2015   Backache 02/10/2015   Peripheral vascular disease (HCC) 04/01/2014   Type 2 diabetes mellitus with peripheral angiopathy (HCC) 04/01/2014   Acute maxillary sinusitis 09/22/2013   Snoring 08/08/2013   Obesity 08/08/2013   Adiposity 08/08/2013   Psychophysiologic insomnia 08/06/2013   Benign paroxysmal positional vertigo 09/16/2012   Right acoustic neuroma (HCC) 04/29/2012   Follow-up examination following treatment with high-risk medication 07/19/2011   Shoulder joint pain 11/19/2009    Past  Surgical History:  Procedure Laterality Date   ATRIAL FIBRILLATION ABLATION N/A 05/29/2019   Procedure: ATRIAL FIBRILLATION ABLATION;  Surgeon: Lei Pump, MD;  Location: [facility];  Service: Cardiovascular;  Laterality: N/A;   ATRIAL FIBRILLATION ABLATION N/A 10/29/2019   Procedure: ATRIAL FIBRILLATION ABLATION;  Surgeon: Lei Pump, MD;  Location: [facility];  Service: Cardiovascular;  Laterality: N/A;   CARDIOVERSION N/A 06/24/2019   Procedure: CARDIOVERSION;  Surgeon: Maudine Sos, MD;  Location: [facility] ENDOSCOPY;  Service: Cardiovascular;  Laterality: N/A;   CARPAL TUNNEL RELEASE Right 04/17/2007   w/ Excision ganglion cyst and Pulley Release right thumb   DILATATION & CURETTAGE/HYSTEROSCOPY WITH MYOSURE N/A 01/12/2017   Procedure: DILATATION & CURETTAGE/HYSTEROSCOPY WITH MYOSURE;  Surgeon: Merryl Abraham, MD;  Location: [facility];  Service: Gynecology;  Laterality: N/A;   ESOPHAGOGASTRODUODENOSCOPY (EGD) WITH PROPOFOL  N/A 09/16/2018   Procedure: ESOPHAGOGASTRODUODENOSCOPY (EGD) WITH PROPOFOL ;  Surgeon: Lanita Pitman, MD;  Location: [facility] ENDOSCOPY;  Service: Endoscopy;  Laterality: N/A;   GIVENS CAPSULE STUDY N/A 09/16/2018   Procedure: GIVENS CAPSULE STUDY;  Surgeon: Lanita Pitman, MD;  Location: [facility] ENDOSCOPY;  Service: Endoscopy;  Laterality: N/A;   HAMMER TOE SURGERY Left 2010   left second toe   IR RADIOLOGY PERIPHERAL GUIDED IV START  10/27/2019   IR US  GUIDE VASC ACCESS RIGHT  10/27/2019   KNEE ARTHROSCOPY Right 2010   KNEE SURGERY Left 1993   LYMPH NODE BIOPSY N/A 03/01/2017   Procedure: SENTINEL LYMPH NODE BIOPSY;  Surgeon: Alphonso Aschoff, MD;  Location: [facility];  Service: Gynecology;  Laterality: N/A;   NASAL SINUS SURGERY  1985 and 1995   REVERSE SHOULDER ARTHROPLASTY Left 03/28/2022   Procedure: REVERSE SHOULDER ARTHROPLASTY;  Surgeon: Osa Blase, MD;  Location: [facility];  Service: Orthopedics;  Laterality: Left;   ROBOTIC  ASSISTED TOTAL HYSTERECTOMY WITH BILATERAL SALPINGO OOPHERECTOMY Bilateral 03/01/2017   Procedure: XI ROBOTIC ASSISTED TOTAL HYSTERECTOMY WITH BILATERAL SALPINGO OOPHORECTOMY;  Surgeon: Alphonso Aschoff, MD;  Location: [facility];  Service: Gynecology;  Laterality: Bilateral;   ROTATOR CUFF REPAIR Right 04/2004   TOE SURGERY Right 2012   Great toe and second toe   TOTAL ELBOW REPLACEMENT Right 2000   TOTAL KNEE ARTHROPLASTY Right 01/12/2010   TOTAL KNEE ARTHROPLASTY Left 03/02/2020   Procedure: TOTAL KNEE ARTHROPLASTY;  Surgeon: Saundra Curl, MD;  Location: [facility];  Service: Orthopedics;  Laterality: Left;   TRANSTHORACIC ECHOCARDIOGRAM  03/28/2016   ef 60-65%/ mild AR and MR   TUBAL LIGATION Bilateral 1978   ULNAR NERVE TRANSPOSITION Right 04/21/2021   Procedure: RIGHT ULNAR NERVE DECOMPRESSION ELBOW;  Surgeon: Brunilda Capra, MD;  Location: [facility];  Service: Orthopedics;  Laterality: Right;  axillary block    OB History     Gravida  2   Para  Term      Preterm      AB      Living  2      SAB      IAB      Ectopic      Multiple      Live Births  2            Home Medications    Prior to Admission medications   Medication Sig Start Date End Date Taking? Authorizing Provider  clindamycin  (CLEOCIN ) 150 MG capsule SMARTSIG:4 Capsule(s) By Mouth 02/28/24  Yes [provider]  doxycycline  (VIBRA -TABS) 100 MG tablet Take 1 tablet (100 mg total) by mouth 2 (two) times daily for 7 days. 03/24/24 03/31/24 Yes Emery Binz  N, FNP  mupirocin ointment (BACTROBAN) 2 % Apply 1 Application topically 2 (two) times daily. 03/24/24  Yes Marthe Dant  N, FNP  allopurinol (ZYLOPRIM) 100 MG tablet Take 100 mg by mouth daily.    [provider]  atorvastatin  (LIPITOR) 40 MG tablet Take 40 mg by mouth every morning.  08/07/13   [provider]  betamethasone valerate (VALISONE) 0.1 % cream Apply 1 application. topically 2 (two) times a  week.    [provider]  chlorthalidone  (HYGROTON ) 25 MG tablet Take 1 tablet (25 mg total) by mouth daily. 02/12/24 05/12/24  Camnitz, Babetta Lesch, MD  Cholecalciferol (VITAMIN D3) 5000 units TABS Take 5,000 Units by mouth daily.     [provider]  Cranberry-Vitamin C (CRANBERRY CONCENTRATE/VITAMINC PO) Take 1 tablet by mouth daily.     [provider]  docusate sodium  (COLACE) 100 MG capsule Take 100 mg by mouth 2 (two) times daily.    [provider]  ipratropium (ATROVENT ) 0.03 % nasal spray Place 2 sprays into both nostrils every 12 (twelve) hours as needed for rhinitis.    [provider]  JANUVIA 100 MG tablet Take 100 mg by mouth daily after supper.  04/01/14   [provider]  Lancets [facility] Jewelene Morton PLUS [address]) MISC  01/08/19   [provider]  metoprolol  succinate (TOPROL -XL) 50 MG 24 hr tablet take 1 tablet by mouth in the morning and at bedtime with or immediately following a meal 07/18/21   Camnitz, Babetta Lesch, MD  Multiple Vitamin (MULTIVITAMIN) tablet Take 1 tablet by mouth daily.    [provider]  olmesartan (BENICAR) 20 MG tablet Take 20 mg by mouth daily. 08/28/19   [provider]  ONE TOUCH ULTRA TEST test strip  01/08/19   [provider]  Polyethyl Glycol-Propyl Glycol (SYSTANE OP) Place 1 drop into both eyes daily as needed (dry eyes).    [provider]  rivaroxaban  (XARELTO ) 20 MG TABS tablet TAKE ONE TABLET BY MOUTH ONCE A DAY WITH SUPPER 02/01/24   Hugh Madura, MD  saline (AYR) GEL Place 1 application into the nose at bedtime.    [provider]    Family History Family History  Problem Relation Age of Onset   Heart failure Mother    Heart failure Father    Sleep apnea Brother     Social History Social History   Tobacco Use   Smoking status: Never    Passive exposure: Never   Smokeless tobacco: Never  Vaping Use   Vaping status: Never Used   Substance Use Topics   Alcohol  use: No   Drug use: No     Allergies   Cephalexin, Augmentin [amoxicillin-pot clavulanate], and Cephalosporins   Review  of Systems Review of Systems  Per HPI  Physical Exam Triage Vital Signs ED Triage Vitals  Encounter Vitals Group     BP 03/24/24 1508 (!) 147/78     Systolic BP Percentile --      Diastolic BP Percentile --      Pulse Rate 03/24/24 1508 63     Resp 03/24/24 1508 17     Temp 03/24/24 1508 97.9 F (36.6 C)     Temp Source 03/24/24 1508 Oral     SpO2 03/24/24 1508 98 %     Weight --      Height --      Head Circumference --      Peak Flow --      Pain Score 03/24/24 1513 3     Pain Loc --      Pain Education --      Exclude from Growth Chart --    No data found.  Updated Vital Signs BP (!) 147/78 (BP Location: Right Arm)   Pulse 63   Temp 97.9 F (36.6 C) (Oral)   Resp 17   SpO2 98%   Visual Acuity Right Eye Distance:   Left Eye Distance:   Bilateral Distance:    Right Eye Near:   Left Eye Near:    Bilateral Near:     Physical Exam Vitals and nursing note reviewed.  Constitutional:      Appearance: Normal appearance.  HENT:     Head: Normocephalic and atraumatic.     Right Ear: External ear normal.     Left Ear: External ear normal.     Nose: Nose normal.     Mouth/Throat:     Mouth: Mucous membranes are moist.  Eyes:     Conjunctiva/sclera: Conjunctivae normal.  Cardiovascular:     Rate and Rhythm: Normal rate.  Pulmonary:     Effort: Pulmonary effort is normal. No respiratory distress.  Musculoskeletal:        General: Normal range of motion.       Feet:  Skin:    General: Skin is warm and dry.     Findings: Erythema present.  Neurological:     General: No focal deficit present.     Mental Status: She is alert and oriented to person, place, and time.  Psychiatric:        Mood and Affect: Mood normal.      UC Treatments / Results  Labs (all labs ordered are listed, but only  abnormal results are displayed) Labs Reviewed  COMPREHENSIVE METABOLIC PANEL WITH GFR  CBC WITH DIFFERENTIAL/PLATELET  MAGNESIUM     EKG   Radiology No results found.  Procedures Procedures (including critical care time)  Medications Ordered in UC Medications - No data to display  Initial Impression / Assessment and Plan / UC Course  I have reviewed the triage vital signs and the nursing notes.  Pertinent labs & imaging results that were available during my care of the patient were reviewed by me and considered in my medical decision making (see chart for details).  Vitals in triage reviewed, patient is hemodynamically stable.  Appears to have an infected ingrown toenail of the left foot, will cover with doxycycline  due to medication allergies.  Is established with podiatry and will follow-up within the next week for potential ingrown toenail removal.  Episodes of shaking at night.  Will check basic labs to ensure no electrolyte abnormalities with recent chlorthalidone  addition to medication regimen.  Encouraged strict  emergency precautions and PCP follow-up.  Plan of care, follow-up care return precautions given, no questions at this time.    Final Clinical Impressions(s) / UC Diagnoses   Final diagnoses:  Episode of shaking  Ingrown toenail of left foot with infection     Discharge Instructions      It is unclear what is causing the shaking episodes.  We are checking some basic labs and contact you if urgent follow-up is needed.  Please follow-up with your doctor who manages these medications for further guidance.  If your husband suspects you are having a seizure, please have him call 911.  You have an ingrown toenail to your left foot.  Take the doxycycline  twice daily for the next 7 days.  Soak the foot in warm water  with antibacterial solution such as Hibiclens  or Epsom salt.  You can do this twice daily.  Afterwards clean and dry the foot completely and apply the  topical antibacterial ointment.  Follow-up with your podiatrist within the next week for potential removal.  Return to clinic for new or urgent symptoms.   ED Prescriptions     Medication Sig Dispense Auth. Provider   doxycycline  (VIBRA -TABS) 100 MG tablet Take 1 tablet (100 mg total) by mouth 2 (two) times daily for 7 days. 14 tablet Harlow Lighter, Babacar Haycraft  N, FNP   mupirocin ointment (BACTROBAN) 2 % Apply 1 Application topically 2 (two) times daily. 22 g Harlow Lighter, Cherilynn Schomburg  N, FNP      PDMP not reviewed this encounter.   Wheeler Hammonds, FNP 03/24/24 1544

## 2024-03-25 ENCOUNTER — Ambulatory Visit (HOSPITAL_COMMUNITY): Payer: Self-pay

## 2024-03-25 LAB — COMPREHENSIVE METABOLIC PANEL WITH GFR
ALT: 25 IU/L (ref 0–32)
AST: 22 IU/L (ref 0–40)
Albumin: 4.6 g/dL (ref 3.8–4.8)
Alkaline Phosphatase: 82 IU/L (ref 44–121)
BUN/Creatinine Ratio: 18 (ref 12–28)
BUN: 9 mg/dL (ref 8–27)
Bilirubin Total: 0.5 mg/dL (ref 0.0–1.2)
CO2: 22 mmol/L (ref 20–29)
Calcium: 10 mg/dL (ref 8.7–10.3)
Chloride: 95 mmol/L — ABNORMAL LOW (ref 96–106)
Creatinine, Ser: 0.49 mg/dL — ABNORMAL LOW (ref 0.57–1.00)
Globulin, Total: 2.1 g/dL (ref 1.5–4.5)
Glucose: 170 mg/dL — ABNORMAL HIGH (ref 70–99)
Potassium: 4.4 mmol/L (ref 3.5–5.2)
Sodium: 134 mmol/L (ref 134–144)
Total Protein: 6.7 g/dL (ref 6.0–8.5)
eGFR: 98 mL/min/{1.73_m2} (ref >59)

## 2024-03-25 LAB — CBC WITH DIFFERENTIAL/PLATELET
Basophils Absolute: 0 10*3/uL (ref 0.0–0.2)
Basos: 1 %
EOS (ABSOLUTE): 0.2 10*3/uL (ref 0.0–0.4)
Eos: 2 %
Hematocrit: 41.8 % (ref 34.0–46.6)
Hemoglobin: 13.7 g/dL (ref 11.1–15.9)
Immature Grans (Abs): 0 10*3/uL (ref 0.0–0.1)
Immature Granulocytes: 0 %
Lymphocytes Absolute: 2 10*3/uL (ref 0.7–3.1)
Lymphs: 26 %
MCH: 29.1 pg (ref 26.6–33.0)
MCHC: 32.8 g/dL (ref 31.5–35.7)
MCV: 89 fL (ref 79–97)
Monocytes Absolute: 0.6 10*3/uL (ref 0.1–0.9)
Monocytes: 8 %
Neutrophils Absolute: 4.7 10*3/uL (ref 1.4–7.0)
Neutrophils: 63 %
Platelets: 192 10*3/uL (ref 150–450)
RBC: 4.7 x10E6/uL (ref 3.77–5.28)
RDW: 13.1 % (ref 11.7–15.4)
WBC: 7.5 10*3/uL (ref 3.4–10.8)

## 2024-03-25 LAB — MAGNESIUM: Magnesium: 1.8 mg/dL (ref 1.6–2.3)

## 2024-03-28 DIAGNOSIS — S43422D Sprain of left rotator cuff capsule, subsequent encounter: Secondary | ICD-10-CM | POA: Diagnosis not present

## 2024-03-31 ENCOUNTER — Observation Stay (HOSPITAL_COMMUNITY)

## 2024-03-31 ENCOUNTER — Observation Stay (HOSPITAL_COMMUNITY)
Admission: EM | Admit: 2024-03-31 | Discharge: 2024-04-01 | Disposition: A | Discharge status: Home / Self Care | Attending: Internal Medicine | Admitting: Internal Medicine

## 2024-03-31 ENCOUNTER — Other Ambulatory Visit: Payer: Self-pay

## 2024-03-31 ENCOUNTER — Encounter (HOSPITAL_COMMUNITY): Payer: Self-pay

## 2024-03-31 ENCOUNTER — Emergency Department (HOSPITAL_COMMUNITY)

## 2024-03-31 DIAGNOSIS — Z7901 Long term (current) use of anticoagulants: Secondary | ICD-10-CM | POA: Insufficient documentation

## 2024-03-31 DIAGNOSIS — G4733 Obstructive sleep apnea (adult) (pediatric): Secondary | ICD-10-CM | POA: Insufficient documentation

## 2024-03-31 DIAGNOSIS — E871 Hypo-osmolality and hyponatremia: Secondary | ICD-10-CM | POA: Insufficient documentation

## 2024-03-31 DIAGNOSIS — Q048 Other specified congenital malformations of brain: Secondary | ICD-10-CM | POA: Diagnosis not present

## 2024-03-31 DIAGNOSIS — I10 Essential (primary) hypertension: Secondary | ICD-10-CM | POA: Insufficient documentation

## 2024-03-31 DIAGNOSIS — G4750 Parasomnia, unspecified: Secondary | ICD-10-CM | POA: Diagnosis not present

## 2024-03-31 DIAGNOSIS — D333 Benign neoplasm of cranial nerves: Secondary | ICD-10-CM | POA: Insufficient documentation

## 2024-03-31 DIAGNOSIS — R569 Unspecified convulsions: Secondary | ICD-10-CM | POA: Diagnosis not present

## 2024-03-31 DIAGNOSIS — I480 Paroxysmal atrial fibrillation: Secondary | ICD-10-CM | POA: Diagnosis not present

## 2024-03-31 DIAGNOSIS — R0902 Hypoxemia: Secondary | ICD-10-CM | POA: Diagnosis not present

## 2024-03-31 DIAGNOSIS — Z79899 Other long term (current) drug therapy: Secondary | ICD-10-CM | POA: Diagnosis not present

## 2024-03-31 DIAGNOSIS — R55 Syncope and collapse: Secondary | ICD-10-CM | POA: Diagnosis not present

## 2024-03-31 DIAGNOSIS — R9082 White matter disease, unspecified: Secondary | ICD-10-CM | POA: Diagnosis not present

## 2024-03-31 DIAGNOSIS — Z96653 Presence of artificial knee joint, bilateral: Secondary | ICD-10-CM | POA: Insufficient documentation

## 2024-03-31 DIAGNOSIS — E785 Hyperlipidemia, unspecified: Secondary | ICD-10-CM | POA: Insufficient documentation

## 2024-03-31 DIAGNOSIS — E119 Type 2 diabetes mellitus without complications: Secondary | ICD-10-CM | POA: Insufficient documentation

## 2024-03-31 DIAGNOSIS — R410 Disorientation, unspecified: Secondary | ICD-10-CM | POA: Diagnosis not present

## 2024-03-31 DIAGNOSIS — M109 Gout, unspecified: Secondary | ICD-10-CM | POA: Insufficient documentation

## 2024-03-31 LAB — CBC WITH DIFFERENTIAL/PLATELET
Abs Immature Granulocytes: 0.03 10*3/uL (ref 0.00–0.07)
Basophils Absolute: 0 10*3/uL (ref 0.0–0.1)
Basophils Relative: 1 %
Eosinophils Absolute: 0.2 10*3/uL (ref 0.0–0.5)
Eosinophils Relative: 3 %
HCT: 38.8 % (ref 36.0–46.0)
Hemoglobin: 13.3 g/dL (ref 12.0–15.0)
Immature Granulocytes: 1 %
Lymphocytes Relative: 25 %
Lymphs Abs: 1.6 10*3/uL (ref 0.7–4.0)
MCH: 29.4 pg (ref 26.0–34.0)
MCHC: 34.3 g/dL (ref 30.0–36.0)
MCV: 85.8 fL (ref 80.0–100.0)
Monocytes Absolute: 0.5 10*3/uL (ref 0.1–1.0)
Monocytes Relative: 8 %
Neutro Abs: 4 10*3/uL (ref 1.7–7.7)
Neutrophils Relative %: 62 %
Platelets: 166 10*3/uL (ref 150–400)
RBC: 4.52 MIL/uL (ref 3.87–5.11)
RDW: 12.9 % (ref 11.5–15.5)
WBC: 6.3 10*3/uL (ref 4.0–10.5)
nRBC: 0 % (ref 0.0–0.2)

## 2024-03-31 LAB — COMPREHENSIVE METABOLIC PANEL WITH GFR
ALT: 21 U/L (ref 0–44)
AST: 36 U/L (ref 15–41)
Albumin: 3.7 g/dL (ref 3.5–5.0)
Alkaline Phosphatase: 49 U/L (ref 38–126)
Anion gap: 8 (ref 5–15)
BUN: 13 mg/dL (ref 8–23)
CO2: 23 mmol/L (ref 22–32)
Calcium: 8.6 mg/dL — ABNORMAL LOW (ref 8.9–10.3)
Chloride: 98 mmol/L (ref 98–111)
Creatinine, Ser: 0.63 mg/dL (ref 0.44–1.00)
GFR, Estimated: >60 mL/min (ref >60)
Glucose, Bld: 135 mg/dL — ABNORMAL HIGH (ref 70–99)
Potassium: 4.4 mmol/L (ref 3.5–5.1)
Sodium: 129 mmol/L — ABNORMAL LOW (ref 135–145)
Total Bilirubin: 1.1 mg/dL (ref 0.0–1.2)
Total Protein: 6 g/dL — ABNORMAL LOW (ref 6.5–8.1)

## 2024-03-31 LAB — CBG MONITORING, ED
Glucose-Capillary: 129 mg/dL — ABNORMAL HIGH (ref 70–99)
Glucose-Capillary: 100 mg/dL — ABNORMAL HIGH (ref 70–99)
Glucose-Capillary: 96 mg/dL (ref 70–99)

## 2024-03-31 LAB — SODIUM, URINE, RANDOM: Sodium, Ur: 81 mmol/L

## 2024-03-31 LAB — OSMOLALITY, URINE: Osmolality, Ur: 359 mosm/kg (ref 300–900)

## 2024-03-31 MED ORDER — INSULIN ASPART 100 UNIT/ML IJ SOLN: 0.0000 [IU] | Freq: Every day | INTRAMUSCULAR | Status: DC

## 2024-03-31 MED ORDER — SODIUM CHLORIDE 0.9 % IV SOLN: INTRAVENOUS | Status: AC

## 2024-03-31 MED ORDER — ACETAMINOPHEN 650 MG RE SUPP
650.0000 mg | Freq: Four times a day (QID) | RECTAL | Status: DC | PRN
Start: 2024-03-31 — End: 2024-04-01

## 2024-03-31 MED ORDER — ACETAMINOPHEN 325 MG PO TABS: 650.0000 mg | ORAL_TABLET | Freq: Four times a day (QID) | ORAL | Status: DC | PRN

## 2024-03-31 MED ORDER — GADOBUTROL 1 MMOL/ML IV SOLN
9.0000 mL | Freq: Once | INTRAVENOUS | Status: AC | PRN
  Administered 2024-03-31: 9 mL via INTRAVENOUS

## 2024-03-31 MED ORDER — INSULIN ASPART 100 UNIT/ML IJ SOLN: 0.0000 [IU] | Freq: Three times a day (TID) | INTRAMUSCULAR | Status: DC

## 2024-03-31 MED ORDER — LORAZEPAM 0.5 MG PO TABS
0.5000 mg | ORAL_TABLET | Freq: Once | ORAL | Status: AC | PRN
  Administered 2024-03-31: 0.5 mg via ORAL
  Filled 2024-03-31: qty 1

## 2024-03-31 NOTE — Consult Note (Addendum)
 NEUROLOGY CONSULT NOTE   Date of service: March 31, 2024 Patient Name: Erika Kim MRN:  [id number] DOB:  [birth year] Chief Complaint: "Concern for episodes of unresponsiveness sometimes with and sometimes without shaking" Requesting Provider: Juliette Oh, MD  History of Present Illness  Erika Kim is a 78 y.o. female with hx of acoustic neuroma on the right, atrial fibrillation on long-term anticoagulation with Xarelto , anemia, hypertension, hyperlipidemia, OSA on CPAP-sees Dr. Omar Bibber at [facility] neurology, type 2 diabetes, presenting for evaluation of multiple episodes of unresponsiveness. Husband describes today's episode is coming in to see her, she was watching TV and was not responsive to voice or tactile stimulation with eyes open staring in front almost like having a staring spell.  Has had similar episodes more than once.  Also has had episodes of whole body shaking without remembering any of those episodes happening. Today's episode also-patient had no recollection of. No tongue biting.  No bowel bladder incontinence. No history of head injury in the past. Has sleep apnea-compliant to her CPAP.  Sees Dr. Omar Bibber at [facility] neurology. Was noted on a water  pill by the cardiologist recently.  No other medication changes Has a history of multiple falls over the past few months to years but says that they were all mechanical falls and she did not lose consciousness and has full recollection of how those falls happened.   ROS  Comprehensive ROS performed and pertinent positives documented in HPI   Past History   Past Medical History:  Diagnosis Date   Acoustic neuroma (HCC) followed by dr brown at [facility]   right side w/ chronic roaring noise   Anemia    hx of 2019 realted to Xarelto     Anticoagulant long-term use    xarelto    Benign paroxysmal positional vertigo    Dysrhythmia    afib- 2 ablations followed by Dr Renna Cary - [facility] - 4/21    Endometrial polyp    Family  history of adverse reaction to anesthesia    daughter- nausea    Frequency of urination    Hx of transfusion of packed red blood cells    related to small intestine in 2019    Hyperlipidemia    Hypertension    OA (osteoarthritis)    left knee   OSA on CPAP    cpap - setting at ? 8    PAF (paroxysmal atrial fibrillation) [facility]) cardiologist-  dr Renna Cary   dx 04/ 2017   PMB (postmenopausal bleeding)    TMJ (temporomandibular joint disorder)    left side-- wears guard   Type 2 diabetes mellitus (HCC)    Wears contact lenses     Past Surgical History:  Procedure Laterality Date   ATRIAL FIBRILLATION ABLATION N/A 05/29/2019   Procedure: ATRIAL FIBRILLATION ABLATION;  Surgeon: Lei Pump, MD;  Location: [facility];  Service: Cardiovascular;  Laterality: N/A;   ATRIAL FIBRILLATION ABLATION N/A 10/29/2019   Procedure: ATRIAL FIBRILLATION ABLATION;  Surgeon: Lei Pump, MD;  Location: [facility];  Service: Cardiovascular;  Laterality: N/A;   CARDIOVERSION N/A 06/24/2019   Procedure: CARDIOVERSION;  Surgeon: Maudine Sos, MD;  Location: [facility] ENDOSCOPY;  Service: Cardiovascular;  Laterality: N/A;   CARPAL TUNNEL RELEASE Right 04/17/2007   w/ Excision ganglion cyst and Pulley Release right thumb   DILATATION & CURETTAGE/HYSTEROSCOPY WITH MYOSURE N/A 01/12/2017   Procedure: DILATATION & CURETTAGE/HYSTEROSCOPY WITH MYOSURE;  Surgeon: Merryl Abraham, MD;  Location: [facility];  Service:  Gynecology;  Laterality: N/A;   ESOPHAGOGASTRODUODENOSCOPY (EGD) WITH PROPOFOL  N/A 09/16/2018   Procedure: ESOPHAGOGASTRODUODENOSCOPY (EGD) WITH PROPOFOL ;  Surgeon: Lanita Pitman, MD;  Location: [facility] ENDOSCOPY;  Service: Endoscopy;  Laterality: N/A;   GIVENS CAPSULE STUDY N/A 09/16/2018   Procedure: GIVENS CAPSULE STUDY;  Surgeon: Lanita Pitman, MD;  Location: [facility] ENDOSCOPY;  Service: Endoscopy;  Laterality: N/A;   HAMMER TOE SURGERY Left 2010   left second toe    IR RADIOLOGY PERIPHERAL GUIDED IV START  10/27/2019   IR US  GUIDE VASC ACCESS RIGHT  10/27/2019   KNEE ARTHROSCOPY Right 2010   KNEE SURGERY Left 1993   LYMPH NODE BIOPSY N/A 03/01/2017   Procedure: SENTINEL LYMPH NODE BIOPSY;  Surgeon: Alphonso Aschoff, MD;  Location: [facility];  Service: Gynecology;  Laterality: N/A;   NASAL SINUS SURGERY  1985 and 1995   REVERSE SHOULDER ARTHROPLASTY Left 03/28/2022   Procedure: REVERSE SHOULDER ARTHROPLASTY;  Surgeon: Osa Blase, MD;  Location: [facility];  Service: Orthopedics;  Laterality: Left;   ROBOTIC ASSISTED TOTAL HYSTERECTOMY WITH BILATERAL SALPINGO OOPHERECTOMY Bilateral 03/01/2017   Procedure: XI ROBOTIC ASSISTED TOTAL HYSTERECTOMY WITH BILATERAL SALPINGO OOPHORECTOMY;  Surgeon: Alphonso Aschoff, MD;  Location: [facility];  Service: Gynecology;  Laterality: Bilateral;   ROTATOR CUFF REPAIR Right 04/2004   TOE SURGERY Right 2012   Great toe and second toe   TOTAL ELBOW REPLACEMENT Right 2000   TOTAL KNEE ARTHROPLASTY Right 01/12/2010   TOTAL KNEE ARTHROPLASTY Left 03/02/2020   Procedure: TOTAL KNEE ARTHROPLASTY;  Surgeon: Saundra Curl, MD;  Location: [facility];  Service: Orthopedics;  Laterality: Left;   TRANSTHORACIC ECHOCARDIOGRAM  03/28/2016   ef 60-65%/ mild AR and MR   TUBAL LIGATION Bilateral 1978   ULNAR NERVE TRANSPOSITION Right 04/21/2021   Procedure: RIGHT ULNAR NERVE DECOMPRESSION ELBOW;  Surgeon: Brunilda Capra, MD;  Location: [facility];  Service: Orthopedics;  Laterality: Right;  axillary block    Family History: Family History  Problem Relation Age of Onset   Heart failure Mother    Heart failure Father    Sleep apnea Brother     Social History  reports that she has never smoked. She has never been exposed to tobacco smoke. She has never used smokeless tobacco. She reports that she does not drink alcohol  and does not use drugs.  Allergies  Allergen Reactions   Cephalexin Itching    Received 2 grams ancef  03/28/2022  without problem   Augmentin [Amoxicillin-Pot Clavulanate] Itching    severe     Cephalosporins Itching    severe    Medications  No current facility-administered medications for this encounter.  Current Outpatient Medications:    allopurinol (ZYLOPRIM) 100 MG tablet, Take 100 mg by mouth daily., Disp: , Rfl:    atorvastatin  (LIPITOR) 40 MG tablet, Take 40 mg by mouth every morning. , Disp: , Rfl:    betamethasone valerate (VALISONE) 0.1 % cream, Apply 1 application. topically 2 (two) times a week., Disp: , Rfl:    chlorthalidone  (HYGROTON ) 25 MG tablet, Take 1 tablet (25 mg total) by mouth daily., Disp: 30 tablet, Rfl: 3   Cholecalciferol (VITAMIN D3) 5000 units TABS, Take 5,000 Units by mouth daily. , Disp: , Rfl:    clindamycin  (CLEOCIN ) 150 MG capsule, SMARTSIG:4 Capsule(s) By Mouth, Disp: , Rfl:    Cranberry-Vitamin C (CRANBERRY CONCENTRATE/VITAMINC PO), Take 1 tablet by mouth daily. , Disp: , Rfl:    docusate sodium  (COLACE) 100 MG capsule, Take 100 mg by mouth  2 (two) times daily., Disp: , Rfl:    doxycycline  (VIBRA -TABS) 100 MG tablet, Take 1 tablet (100 mg total) by mouth 2 (two) times daily for 7 days., Disp: 14 tablet, Rfl: 0   ipratropium (ATROVENT ) 0.03 % nasal spray, Place 2 sprays into both nostrils every 12 (twelve) hours as needed for rhinitis., Disp: , Rfl:    JANUVIA 100 MG tablet, Take 100 mg by mouth daily after supper. , Disp: , Rfl:    Lancets (ONETOUCH DELICA PLUS LANCET33G) MISC, , Disp: , Rfl:    metoprolol  succinate (TOPROL -XL) 50 MG 24 hr tablet, take 1 tablet by mouth in the morning and at bedtime with or immediately following a meal, Disp: 180 tablet, Rfl: 2   Multiple Vitamin (MULTIVITAMIN) tablet, Take 1 tablet by mouth daily., Disp: , Rfl:    mupirocin  ointment (BACTROBAN ) 2 %, Apply 1 Application topically 2 (two) times daily., Disp: 22 g, Rfl: 0   olmesartan (BENICAR) 20 MG tablet, Take 20 mg by mouth daily., Disp: , Rfl:    ONE TOUCH ULTRA TEST test  strip, , Disp: , Rfl:    Polyethyl Glycol-Propyl Glycol (SYSTANE OP), Place 1 drop into both eyes daily as needed (dry eyes)., Disp: , Rfl:    rivaroxaban  (XARELTO ) 20 MG TABS tablet, TAKE ONE TABLET BY MOUTH ONCE A DAY WITH SUPPER, Disp: 90 tablet, Rfl: 1   saline (AYR) GEL, Place 1 application into the nose at bedtime., Disp: , Rfl:   Vitals   Vitals:   04-02-2024 1545 04/02/24 1935  BP: (!) 153/60 (!) 144/63  Pulse: 84 (!) 59  Resp: 15 15  Temp: 98.1 F (36.7 C) 98.2 F (36.8 C)  TempSrc: Oral Oral  SpO2: 98% 96%    There is no height or weight on file to calculate BMI.   Physical Exam  GENERAL: Awake, alert in NAD HEENT: - Normocephalic and atraumatic, dry mm, no LN++, no Thyromegally LUNGS - Clear to auscultation bilaterally with no wheezes CV - S1S2 RRR, no m/r/g, equal pulses bilaterally. ABDOMEN - Soft, nontender, nondistended with normoactive BS NEURO:  Mental Status: AA&Ox3 Speech and Language: speech is clear.  Naming, repetition, fluency, and comprehension intact. Cranial Nerves: PERRL. EOMI, visual fields full, no facial asymmetry, facial sensation intact, hearing intact, tongue/uvula/soft palate midline, normal sternocleidomastoid and trapezius muscle strength. No evidence of tongue atrophy or fibrillations Motor: 5/5 without drift in all fours Tone: is normal and bulk is normal Sensation- Intact to light touch bilaterally Coordination: FTN intact bilaterally, no ataxia in BLE. Gait- deferred   Labs/Imaging/Neurodiagnostic studies   CBC:  Recent Labs  Lab 2024/04/02 1613  WBC 6.3  NEUTROABS 4.0  HGB 13.3  HCT 38.8  MCV 85.8  PLT 166   Basic Metabolic Panel:  Lab Results  Component Value Date   NA 129 (L) Apr 02, 2024   K 4.4 Apr 02, 2024   CO2 23 04/02/2024   GLUCOSE 135 (H) Apr 02, 2024   BUN 13 April 02, 2024   CREATININE 0.63 04-02-2024   CALCIUM  8.6 (L) 2024-04-02   GFRNONAA >60 04/02/2024   GFRAA >60 02/23/2020   Lipid Panel: No results found for:  "LDLCALC" HgbA1c:  Lab Results  Component Value Date   HGBA1C 5.9 (H) 03/17/2022   INR  Lab Results  Component Value Date   INR 1.53 01/10/2017   APTT  Lab Results  Component Value Date   APTT 29 01/10/2017   CT Head without contrast(Personally reviewed): No acute changes  Neurodiagnostics Video EEG: Ordered and  pending   ASSESSMENT   Erika Kim is a 78 y.o. female with above past medical history presenting for evaluation of multiple episodes concerning for seizure-like activity.  These episodes have been both staring like episodes without any recollection of these episodes as well as shaking there is happen mainly at nighttime. At this point, I would recommend holding off on any antiepileptics but further investigation with video EEG and cardiac monitoring.  Mild hyponatremia today-not severe enough to cause seizures  Impression: Evaluate for seizure versus parasomnias, evaluate for cardiac arrhythmias causing symptomatic changes in mentation  RECOMMENDATIONS  Video EEG MRI brain with and without contrast Cardiac monitoring Hold off on using any antiseizure medications for now.  If the EEG has any inkling towards any focality, may consider starting her on antiseizure medications. Maintain seizure precautions. Correction of metabolic derangements per primary team. Plan was discussed with the ED provider Dr. Lewis Red, patient and her husband. Plan was also relayed to admit hospitalist Dr. Michell Ahumada ______________________________________________________________________    Signed, Tona Francis, MD [facility]

## 2024-03-31 NOTE — ED Notes (Signed)
 Patient given sandwich and beverage, states no other needs.

## 2024-03-31 NOTE — ED Triage Notes (Signed)
 PT BIB EMS for a seizure, patient had a blank stare and was unresponsive for a period of time, was disoriented upon EMS arrival, after a while, delayed response but able to answer questions appropriately.  Per husband, she has had multiple episodes over the last 5-6 weeks, she does not have a history of seizure and has not been treated for seizures.  BP 166/80 HR 88 Resp 22 O2 98 CBG 126

## 2024-03-31 NOTE — Progress Notes (Signed)
 LTM EEG hooked up and running - no initial skin breakdown - push button tested - [facility] monitoring.

## 2024-03-31 NOTE — ED Notes (Signed)
 Patient transported to MRI

## 2024-03-31 NOTE — Progress Notes (Signed)
 Cpap on hold for now due to pt. EEG testing.

## 2024-03-31 NOTE — ED Provider Notes (Signed)
 [place] [facility] Provider Note   CSN: [id number] Arrival date & time: 03/31/24  1527     History {Add pertinent medical, surgical, social history, OB history to HPI:1} Chief Complaint  Patient presents with  . Seizures    Erika Kim is a 78 y.o. female.  HPI      Home Medications Prior to Admission medications   Medication Sig Start Date End Date Taking? Authorizing Provider  allopurinol (ZYLOPRIM) 100 MG tablet Take 100 mg by mouth daily.    [provider]  atorvastatin  (LIPITOR) 40 MG tablet Take 40 mg by mouth every morning.  08/07/13   [provider]  betamethasone valerate (VALISONE) 0.1 % cream Apply 1 application. topically 2 (two) times a week.    [provider]  chlorthalidone  (HYGROTON ) 25 MG tablet Take 1 tablet (25 mg total) by mouth daily. 02/12/24 05/12/24  Camnitz, Babetta Lesch, MD  Cholecalciferol (VITAMIN D3) 5000 units TABS Take 5,000 Units by mouth daily.     [provider]  clindamycin  (CLEOCIN ) 150 MG capsule SMARTSIG:4 Capsule(s) By Mouth 02/28/24   [provider]  Cranberry-Vitamin C (CRANBERRY CONCENTRATE/VITAMINC PO) Take 1 tablet by mouth daily.     [provider]  docusate sodium  (COLACE) 100 MG capsule Take 100 mg by mouth 2 (two) times daily.    [provider]  doxycycline  (VIBRA -TABS) 100 MG tablet Take 1 tablet (100 mg total) by mouth 2 (two) times daily for 7 days. 03/24/24 03/31/24  Harlow Lighter, [place]  N, FNP  ipratropium (ATROVENT ) 0.03 % nasal spray Place 2 sprays into both nostrils every 12 (twelve) hours as needed for rhinitis.    [provider]  JANUVIA 100 MG tablet Take 100 mg by mouth daily after supper.  04/01/14   [provider]  Lancets [facility] Jewelene Morton PLUS [place]) MISC  01/08/19   [provider]  metoprolol  succinate (TOPROL -XL) 50 MG 24 hr tablet take 1 tablet by mouth in the morning and at bedtime  with or immediately following a meal 07/18/21   Camnitz, Babetta Lesch, MD  Multiple Vitamin (MULTIVITAMIN) tablet Take 1 tablet by mouth daily.    [provider]  mupirocin  ointment (BACTROBAN ) 2 % Apply 1 Application topically 2 (two) times daily. 03/24/24   Harlow Lighter, [place]  N, FNP  olmesartan (BENICAR) 20 MG tablet Take 20 mg by mouth daily. 08/28/19   [provider]  ONE TOUCH ULTRA TEST test strip  01/08/19   [provider]  Polyethyl Glycol-Propyl Glycol (SYSTANE OP) Place 1 drop into both eyes daily as needed (dry eyes).    [provider]  rivaroxaban  (XARELTO ) 20 MG TABS tablet TAKE ONE TABLET BY MOUTH ONCE A DAY WITH SUPPER 02/01/24   Hugh Madura, MD  saline (AYR) GEL Place 1 application into the nose at bedtime.    [provider]      Allergies    Cephalexin, Augmentin [amoxicillin-pot clavulanate], and Cephalosporins    Review of Systems   Review of Systems  Physical Exam Updated Vital Signs BP (!) 153/60 (BP Location: Left Arm)   Pulse 84   Temp 98.1 F (36.7 C) (Oral)   Resp 15   SpO2 98%  Physical Exam  ED Results / Procedures / Treatments   Labs (all labs ordered are listed, but only abnormal results are displayed) Labs Reviewed  COMPREHENSIVE METABOLIC PANEL WITH GFR - Abnormal; Notable for the following components:  Result Value   Sodium 129 (*)    Glucose, Bld 135 (*)    Calcium  8.6 (*)    Total Protein 6.0 (*)    All other components within normal limits  CBG MONITORING, ED - Abnormal; Notable for the following components:   Glucose-Capillary 129 (*)    All other components within normal limits  CBC WITH DIFFERENTIAL/PLATELET    EKG None  Radiology No results found.  Procedures Procedures  {Document cardiac monitor, telemetry assessment procedure when appropriate:1}  Medications Ordered in ED Medications - No data to display  ED Course/ Medical Decision Making/ A&P   {   Click here for  ABCD2, HEART and other calculatorsREFRESH Note before signing :1}                              Medical Decision Making Amount and/or Complexity of Data Reviewed Labs: ordered. Radiology: ordered.  Risk Decision regarding hospitalization.   ***  {Document critical care time when appropriate:1} {Document review of labs and clinical decision tools ie heart score, Chads2Vasc2 etc:1}  {Document your independent review of radiology images, and any outside records:1} {Document your discussion with family members, caretakers, and with consultants:1} {Document social determinants of health affecting pt's care:1} {Document your decision making why or why not admission, treatments were needed:1} Final Clinical Impression(s) / ED Diagnoses Final diagnoses:  None    Rx / DC Orders ED Discharge Orders     None

## 2024-03-31 NOTE — ED Notes (Addendum)
 Neuro tech at bedside to apply EEG leads, patient awaiting PRN medication order for MRI.

## 2024-03-31 NOTE — ED Notes (Signed)
 Patient transported back from MRI, monitoring equipment replaced, patient resting in bed, states no needs at this time.

## 2024-03-31 NOTE — H&P (Signed)
 History and Physical    Erika Kim:[id number] DOB: 04-28-1946 DOA: 03/31/2024  PCP: Bertha Broad, MD  Patient coming from: Home  Chief Complaint: Seizures  HPI: Erika Kim is a 78 y.o. female with medical history significant of acoustic neuroma, anemia, BPPV, hypertension, hyperlipidemia, osteoarthritis, OSA on CPAP, paroxysmal A-fib on Xarelto , type 2 diabetes, gout, obesity presented to the ED via EMS for evaluation of seizures.  Initially upon EMS arrival, patient had a blank stare and was unresponsive but later improved.  Slightly hypertensive on arrival to the ED but remainder of vital signs stable.  Afebrile.  Labs showing no leukocytosis, sodium 129 (previously 134 a week ago), glucose 135.  CT head showing no acute intracranial abnormality.  Neurology consulted and [facility] called to admit.  History provided by the patient and her husband at bedside.  Husband states for the past 5 or 6 weeks patient has been having seizures in her sleep at night.  She starts shaking and becomes unresponsive during these episodes.  Today she took a nap in the afternoon and had another episode of seizure-like activity and when EMS arrived she was confused.  Patient states she used to take hydrochlorothiazide  for swelling of her legs and her PCP switched the medication to chlorthalidone  about 6 weeks ago.  Denies chest pain or shortness of breath.  Denies nausea, vomiting, or abdominal pain.  Review of Systems:  Review of Systems  All other systems reviewed and are negative.   Past Medical History:  Diagnosis Date   Acoustic neuroma (HCC) followed by dr brown at [facility]   right side w/ chronic roaring noise   Anemia    hx of 2019 realted to Xarelto     Anticoagulant long-term use    xarelto    Benign paroxysmal positional vertigo    Dysrhythmia    afib- 2 ablations followed by Dr Renna Cary - [facility] - 4/21    Endometrial polyp    Family history of adverse reaction to anesthesia    daughter-  nausea    Frequency of urination    Hx of transfusion of packed red blood cells    related to small intestine in 2019    Hyperlipidemia    Hypertension    OA (osteoarthritis)    left knee   OSA on CPAP    cpap - setting at ? 8    PAF (paroxysmal atrial fibrillation) [facility]) cardiologist-  dr Renna Cary   dx 04/ 2017   PMB (postmenopausal bleeding)    TMJ (temporomandibular joint disorder)    left side-- wears guard   Type 2 diabetes mellitus (HCC)    Wears contact lenses     Past Surgical History:  Procedure Laterality Date   ATRIAL FIBRILLATION ABLATION N/A 05/29/2019   Procedure: ATRIAL FIBRILLATION ABLATION;  Surgeon: Lei Pump, MD;  Location: [facility];  Service: Cardiovascular;  Laterality: N/A;   ATRIAL FIBRILLATION ABLATION N/A 10/29/2019   Procedure: ATRIAL FIBRILLATION ABLATION;  Surgeon: Lei Pump, MD;  Location: [facility];  Service: Cardiovascular;  Laterality: N/A;   CARDIOVERSION N/A 06/24/2019   Procedure: CARDIOVERSION;  Surgeon: Maudine Sos, MD;  Location: [facility] ENDOSCOPY;  Service: Cardiovascular;  Laterality: N/A;   CARPAL TUNNEL RELEASE Right 04/17/2007   w/ Excision ganglion cyst and Pulley Release right thumb   DILATATION & CURETTAGE/HYSTEROSCOPY WITH MYOSURE N/A 01/12/2017   Procedure: DILATATION & CURETTAGE/HYSTEROSCOPY WITH MYOSURE;  Surgeon: Merryl Abraham, MD;  Location: [facility];  Service: Gynecology;  Laterality: N/A;   ESOPHAGOGASTRODUODENOSCOPY (EGD) WITH PROPOFOL  N/A 09/16/2018   Procedure: ESOPHAGOGASTRODUODENOSCOPY (EGD) WITH PROPOFOL ;  Surgeon: Lanita Pitman, MD;  Location: [facility] ENDOSCOPY;  Service: Endoscopy;  Laterality: N/A;   GIVENS CAPSULE STUDY N/A 09/16/2018   Procedure: GIVENS CAPSULE STUDY;  Surgeon: Lanita Pitman, MD;  Location: [facility] ENDOSCOPY;  Service: Endoscopy;  Laterality: N/A;   HAMMER TOE SURGERY Left 2010   left second toe   IR RADIOLOGY PERIPHERAL GUIDED IV START  10/27/2019    IR US  GUIDE VASC ACCESS RIGHT  10/27/2019   KNEE ARTHROSCOPY Right 2010   KNEE SURGERY Left 1993   LYMPH NODE BIOPSY N/A 03/01/2017   Procedure: SENTINEL LYMPH NODE BIOPSY;  Surgeon: Alphonso Aschoff, MD;  Location: [facility];  Service: Gynecology;  Laterality: N/A;   NASAL SINUS SURGERY  1985 and 1995   REVERSE SHOULDER ARTHROPLASTY Left 03/28/2022   Procedure: REVERSE SHOULDER ARTHROPLASTY;  Surgeon: Osa Blase, MD;  Location: [facility];  Service: Orthopedics;  Laterality: Left;   ROBOTIC ASSISTED TOTAL HYSTERECTOMY WITH BILATERAL SALPINGO OOPHERECTOMY Bilateral 03/01/2017   Procedure: XI ROBOTIC ASSISTED TOTAL HYSTERECTOMY WITH BILATERAL SALPINGO OOPHORECTOMY;  Surgeon: Alphonso Aschoff, MD;  Location: [facility];  Service: Gynecology;  Laterality: Bilateral;   ROTATOR CUFF REPAIR Right 04/2004   TOE SURGERY Right 2012   Great toe and second toe   TOTAL ELBOW REPLACEMENT Right 2000   TOTAL KNEE ARTHROPLASTY Right 01/12/2010   TOTAL KNEE ARTHROPLASTY Left 03/02/2020   Procedure: TOTAL KNEE ARTHROPLASTY;  Surgeon: Saundra Curl, MD;  Location: [facility];  Service: Orthopedics;  Laterality: Left;   TRANSTHORACIC ECHOCARDIOGRAM  03/28/2016   ef 60-65%/ mild AR and MR   TUBAL LIGATION Bilateral 1978   ULNAR NERVE TRANSPOSITION Right 04/21/2021   Procedure: RIGHT ULNAR NERVE DECOMPRESSION ELBOW;  Surgeon: Brunilda Capra, MD;  Location: [facility];  Service: Orthopedics;  Laterality: Right;  axillary block     reports that she has never smoked. She has never been exposed to tobacco smoke. She has never used smokeless tobacco. She reports that she does not drink alcohol  and does not use drugs.  Allergies  Allergen Reactions   Cephalexin Itching    Received 2 grams ancef  03/28/2022 without problem   Augmentin [Amoxicillin-Pot Clavulanate] Itching    severe     Cephalosporins Itching    severe    Family History  Problem Relation Age of Onset   Heart failure Mother    Heart failure  Father    Sleep apnea Brother     Prior to Admission medications   Medication Sig Start Date End Date Taking? Authorizing Provider  allopurinol (ZYLOPRIM) 100 MG tablet Take 100 mg by mouth daily.    [provider]  atorvastatin  (LIPITOR) 40 MG tablet Take 40 mg by mouth every morning.  08/07/13   [provider]  betamethasone valerate (VALISONE) 0.1 % cream Apply 1 application. topically 2 (two) times a week.    [provider]  chlorthalidone  (HYGROTON ) 25 MG tablet Take 1 tablet (25 mg total) by mouth daily. 02/12/24 05/12/24  Camnitz, Babetta Lesch, MD  Cholecalciferol (VITAMIN D3) 5000 units TABS Take 5,000 Units by mouth daily.     [provider]  clindamycin  (CLEOCIN ) 150 MG capsule SMARTSIG:4 Capsule(s) By Mouth 02/28/24   [provider]  Cranberry-Vitamin C (CRANBERRY CONCENTRATE/VITAMINC PO) Take 1 tablet by mouth daily.     [provider]  docusate sodium  (COLACE) 100 MG capsule Take  100 mg by mouth 2 (two) times daily.    [provider]  doxycycline  (VIBRA -TABS) 100 MG tablet Take 1 tablet (100 mg total) by mouth 2 (two) times daily for 7 days. 03/24/24 03/31/24  Harlow Lighter, [place]  N, FNP  ipratropium (ATROVENT ) 0.03 % nasal spray Place 2 sprays into both nostrils every 12 (twelve) hours as needed for rhinitis.    [provider]  JANUVIA 100 MG tablet Take 100 mg by mouth daily after supper.  04/01/14   [provider]  Lancets [facility] Jewelene Morton PLUS [place]) MISC  01/08/19   [provider]  metoprolol  succinate (TOPROL -XL) 50 MG 24 hr tablet take 1 tablet by mouth in the morning and at bedtime with or immediately following a meal 07/18/21   Camnitz, Babetta Lesch, MD  Multiple Vitamin (MULTIVITAMIN) tablet Take 1 tablet by mouth daily.    [provider]  mupirocin  ointment (BACTROBAN ) 2 % Apply 1 Application topically 2 (two) times daily. 03/24/24   Harlow Lighter, [place]  N, FNP  olmesartan  (BENICAR) 20 MG tablet Take 20 mg by mouth daily. 08/28/19   [provider]  ONE TOUCH ULTRA TEST test strip  01/08/19   [provider]  Polyethyl Glycol-Propyl Glycol (SYSTANE OP) Place 1 drop into both eyes daily as needed (dry eyes).    [provider]  rivaroxaban  (XARELTO ) 20 MG TABS tablet TAKE ONE TABLET BY MOUTH ONCE A DAY WITH SUPPER 02/01/24   Hugh Madura, MD  saline (AYR) GEL Place 1 application into the nose at bedtime.    [provider]    Physical Exam: Vitals:   03/31/24 1545 03/31/24 1935  BP: (!) 153/60 (!) 144/63  Pulse: 84 (!) 59  Resp: 15 15  Temp: 98.1 F (36.7 C) 98.2 F (36.8 C)  TempSrc: Oral Oral  SpO2: 98% 96%    Physical Exam Vitals reviewed.  Constitutional:      General: She is not in acute distress. HENT:     Head: Normocephalic and atraumatic.  Eyes:     Extraocular Movements: Extraocular movements intact.  Cardiovascular:     Rate and Rhythm: Normal rate and regular rhythm.     Pulses: Normal pulses.  Pulmonary:     Effort: Pulmonary effort is normal. No respiratory distress.     Breath sounds: Normal breath sounds. No wheezing or rales.  Abdominal:     General: Bowel sounds are normal. There is no distension.     Palpations: Abdomen is soft.     Tenderness: There is no abdominal tenderness. There is no guarding.  Musculoskeletal:     Cervical back: Normal range of motion.     Right lower leg: No edema.     Left lower leg: No edema.  Skin:    General: Skin is warm and dry.  Neurological:     General: No focal deficit present.     Mental Status: She is alert and oriented to person, place, and time.     Cranial Nerves: No cranial nerve deficit.     Sensory: No sensory deficit.     Motor: No weakness.     Labs on Admission: I have personally reviewed following labs and imaging studies  CBC: Recent Labs  Lab 03/31/24 1613  WBC 6.3  NEUTROABS 4.0  HGB 13.3  HCT 38.8  MCV 85.8  PLT 166    Basic Metabolic Panel: Recent Labs  Lab 03/31/24 1613  NA 129*  K 4.4  CL 98  CO2 23  GLUCOSE 135*  BUN 13  CREATININE 0.63  CALCIUM  8.6*   GFR: CrCl cannot be calculated (Unknown ideal weight.). Liver Function Tests: Recent Labs  Lab 03/31/24 1613  AST 36  ALT 21  ALKPHOS 49  BILITOT 1.1  PROT 6.0*  ALBUMIN 3.7   No results for input(s): "LIPASE", "AMYLASE" in the last 168 hours. No results for input(s): "AMMONIA" in the last 168 hours. Coagulation Profile: No results for input(s): "INR", "PROTIME" in the last 168 hours. Cardiac Enzymes: No results for input(s): "CKTOTAL", "CKMB", "CKMBINDEX", "TROPONINI" in the last 168 hours. BNP (last 3 results) No results for input(s): "PROBNP" in the last 8760 hours. HbA1C: No results for input(s): "HGBA1C" in the last 72 hours. CBG: Recent Labs  Lab 03/31/24 1610  GLUCAP 129*   Lipid Profile: No results for input(s): "CHOL", "HDL", "LDLCALC", "TRIG", "CHOLHDL", "LDLDIRECT" in the last 72 hours. Thyroid Function Tests: No results for input(s): "TSH", "T4TOTAL", "FREET4", "T3FREE", "THYROIDAB" in the last 72 hours. Anemia Panel: No results for input(s): "VITAMINB12", "FOLATE", "FERRITIN", "TIBC", "IRON", "RETICCTPCT" in the last 72 hours. Urine analysis:    Component Value Date/Time   COLORURINE STRAW (A) 02/23/2020 1101   APPEARANCEUR CLEAR 02/23/2020 1101   LABSPEC 1.003 (L) 02/23/2020 1101   PHURINE 8.0 02/23/2020 1101   GLUCOSEU NEGATIVE 02/23/2020 1101   HGBUR NEGATIVE 02/23/2020 1101   BILIRUBINUR NEGATIVE 02/23/2020 1101   KETONESUR NEGATIVE 02/23/2020 1101   PROTEINUR NEGATIVE 02/23/2020 1101   UROBILINOGEN 0.2 01/12/2010 0939   NITRITE NEGATIVE 02/23/2020 1101   LEUKOCYTESUR TRACE (A) 02/23/2020 1101    Radiological Exams on Admission: CT Head Wo Contrast Result Date: 03/31/2024 CLINICAL DATA:  Seizure EXAM: CT HEAD WITHOUT CONTRAST TECHNIQUE: Contiguous axial images were obtained from the base of  the skull through the vertex without intravenous contrast. RADIATION DOSE REDUCTION: This exam was performed according to the departmental dose-optimization program which includes automated exposure control, adjustment of the mA and/or kV according to patient size and/or use of iterative reconstruction technique. COMPARISON:  CT 05/01/2022 FINDINGS: Brain: No acute territorial infarction, hemorrhage or intracranial mass. Stable ventricle size. Minimal white matter hypodensity likely chronic small vessel ischemic change Vascular: No hyperdense vessels.  Carotid vascular calcification Skull: Normal. Negative for fracture or focal lesion. Sinuses/Orbits: No acute finding. Other: None IMPRESSION: No CT evidence for acute intracranial abnormality. Electronically Signed   By: Esmeralda Hedge M.D.   On: 03/31/2024 18:31    Assessment and Plan  Seizure-like activity CT head showing no acute intracranial abnormality.  Labs showing hyponatremia but not to the severity where you would expect seizures.  No other significant metabolic/electrolyte derangements.  Neurology consulted and recommended holding off any antiepileptics but further investigation to evaluate for seizures versus parasomnias. -Cardiac monitoring to evaluate for arrhythmia causing symptomatic changes in mentation -Video EEG -MRI of the brain with and without contrast -Seizure precautions  Hyponatremia Possibly related to thiazide diuretic use.  Sodium 129 (previously 134 a week ago).  Start IV fluid hydration with normal saline and monitor sodium level every 6 hours.  Check serum osmolarity, urine sodium and osmolarity, TSH, and cortisol.  Hold chlorthalidone  at this time.  OSA Continue nightly CPAP.  Paroxysmal A-fib Continue metoprolol  and Xarelto  after pharmacy med rec is done.  Hypertension SBP currently in the 140s.  Continue metoprolol  after pharmacy med rec is done.  Hold chlorthalidone  and olmesartan due to hyponatremia.  Type 2  diabetes Last A1c 5.8 in May 2024, repeat ordered.  Hold Januvia and placed on sensitive sliding scale insulin  ACHS.  Hyperlipidemia Gout Pharmacy med rec pending.  DVT prophylaxis: Continue Xarelto  after pharmacy med rec is done. Code Status: Full Code (discussed with the patient and her husband) Family Communication: Husband at bedside. Consults called: Neurology Level of care: Progressive Care Unit Admission status: It is my clinical opinion that referral for OBSERVATION is reasonable and necessary in this patient based on the above information provided. The aforementioned taken together are felt to place the patient at high risk for further clinical deterioration. However, it is anticipated that the patient may be medically stable for discharge from the hospital within 24 to 48 hours.  Juliette Oh MD [facility]  If 7PM-7AM, please contact night-coverage [email or web address]  03/31/2024, 7:41 PM

## 2024-03-31 NOTE — ED Notes (Signed)
 Pt was able to ambulate to the RR and back with standby assistant. Pt did well walking no complains of dizziness. I put pt back on monitor.

## 2024-04-01 ENCOUNTER — Observation Stay (HOSPITAL_COMMUNITY)

## 2024-04-01 DIAGNOSIS — G4750 Parasomnia, unspecified: Secondary | ICD-10-CM | POA: Diagnosis not present

## 2024-04-01 DIAGNOSIS — D333 Benign neoplasm of cranial nerves: Secondary | ICD-10-CM | POA: Diagnosis not present

## 2024-04-01 DIAGNOSIS — R569 Unspecified convulsions: Secondary | ICD-10-CM | POA: Diagnosis not present

## 2024-04-01 LAB — CBG MONITORING, ED
Glucose-Capillary: 87 mg/dL (ref 70–99)
Glucose-Capillary: 89 mg/dL (ref 70–99)

## 2024-04-01 LAB — SODIUM: Sodium: 136 mmol/L (ref 135–145)

## 2024-04-01 LAB — CORTISOL: Cortisol, Plasma: 12.6 ug/dL

## 2024-04-01 LAB — OSMOLALITY: Osmolality: 289 mosm/kg (ref 275–295)

## 2024-04-01 LAB — TSH: TSH: 2.955 u[IU]/mL (ref 0.350–4.500)

## 2024-04-01 MED ORDER — GUAIFENESIN 100 MG/5ML PO LIQD: 5.0000 mL | ORAL | Status: DC | PRN

## 2024-04-01 MED ORDER — ATORVASTATIN CALCIUM 40 MG PO TABS
40.0000 mg | ORAL_TABLET | Freq: Every morning | ORAL | Status: DC
  Administered 2024-04-01: 40 mg via ORAL
  Filled 2024-04-01: qty 1

## 2024-04-01 MED ORDER — METOPROLOL SUCCINATE ER 25 MG PO TB24
50.0000 mg | ORAL_TABLET | Freq: Two times a day (BID) | ORAL | Status: DC
  Administered 2024-04-01: 50 mg via ORAL
  Filled 2024-04-01: qty 2

## 2024-04-01 MED ORDER — ALLOPURINOL 100 MG PO TABS
100.0000 mg | ORAL_TABLET | Freq: Every day | ORAL | Status: DC
  Filled 2024-04-01: qty 1

## 2024-04-01 MED ORDER — HYDRALAZINE HCL 20 MG/ML IJ SOLN: 10.0000 mg | INTRAMUSCULAR | Status: DC | PRN

## 2024-04-01 MED ORDER — DOCUSATE SODIUM 100 MG PO CAPS
100.0000 mg | ORAL_CAPSULE | Freq: Two times a day (BID) | ORAL | Status: DC
  Administered 2024-04-01: 100 mg via ORAL
  Filled 2024-04-01: qty 1

## 2024-04-01 MED ORDER — METOPROLOL TARTRATE 5 MG/5ML IV SOLN: 5.0000 mg | INTRAVENOUS | Status: DC | PRN

## 2024-04-01 MED ORDER — IPRATROPIUM-ALBUTEROL 0.5-2.5 (3) MG/3ML IN SOLN: 3.0000 mL | RESPIRATORY_TRACT | Status: DC | PRN

## 2024-04-01 MED ORDER — RIVAROXABAN 10 MG PO TABS: 20.0000 mg | ORAL_TABLET | Freq: Every day | ORAL | Status: DC

## 2024-04-01 MED ORDER — IRBESARTAN 300 MG PO TABS
150.0000 mg | ORAL_TABLET | Freq: Every day | ORAL | Status: DC
  Administered 2024-04-01: 150 mg via ORAL
  Filled 2024-04-01: qty 1

## 2024-04-01 NOTE — Hospital Course (Addendum)
 Brief Narrative:   78 year old with history of acoustic neuroma, anemia, BPPV, HTN, HLD, OA, OSA on CPAP, paroxysmal A-fib on Xarelto , DM2, gout, obesity brought to the hospital for evaluation of seizure.  Apparently for the past 5-6 weeks per husband patient has been having seizures in her sleep where she starts shaking, becomes unresponsive followed by confusion.  When EMS arrived she had blank stares and was unresponsive later improved.  Initial CT of the head negative, neurology team was consulted.  MRI brain, video EEG was ordered.  MRI brain showed chronic 1 cm right IAC canal mass concerning for vestibular schwannoma which patient has had since 2016 followed by outpatient [facility].  This has been stable.  EEG is negative for any seizure activity. Discharge today if cleared by neurology  Assessment & Plan:  Principal Problem:   Seizure-like activity (HCC) Active Problems:   Paroxysmal atrial fibrillation (HCC)   Diabetes (HCC)   Essential hypertension   Obstructive sleep apnea syndrome   Hyponatremia     Seizure-like activity Etiology is unclear at this time.  CT head negative.  Seen by neurology.  EEG is unremarkable. Outptn Neurology Follow up.  -MRI brain showing 1 cm enhancement at the right IAC concerning for vestibular schwannoma   1 cm right internal auditory canal mass - This is concerning for vestibular schwannoma.  Looks like this was present on the MRI performed on 10/14/05.  Has been following ENT since 2012 at [facility].  Last seen February 19, 2023.  This mass has been stable and not changed in years.  They are doing periodic CTA, next  time 2027.  Defer further management to their service   Hyponatremia Likely from hydrochlorothiazide  related.  Admission sodium 129.  This is not severe.  Sodium today is 136   OSA Continue nightly CPAP.   Paroxysmal A-fib -Toprol -XL.  Eliquis.    Hypertension On Toprol -XL, ARB.   Type 2 diabetes Last A1c 5.8 in May 2024, home  meds on hold.  Sliding scale and Accu-Cheks   Hyperlipidemia Gout Allopurinol, Lipitor    DVT prophylaxis: Xarelto     Code Status: Full Code Family Communication:   DC  Subjective: Feeling well no complaints   Examination:  General exam: Appears calm and comfortable  Respiratory system: Clear to auscultation. Respiratory effort normal. Cardiovascular system: S1 & S2 heard, RRR. No JVD, murmurs, rubs, gallops or clicks. No pedal edema. Gastrointestinal system: Abdomen is nondistended, soft and nontender. No organomegaly or masses felt. Normal bowel sounds heard. Central nervous system: Alert and oriented. No focal neurological deficits. Extremities: Symmetric 5 x 5 power. Skin: No rashes, lesions or ulcers Psychiatry: Judgement and insight appear normal. Mood & affect appropriate.

## 2024-04-01 NOTE — Progress Notes (Signed)
 PROGRESS NOTE    AUDRAY RUMORE  EAV:[id number] DOB: 03/23/1946 DOA: 03/31/2024 PCP: Bertha Broad, MD    Brief Narrative:   78 year old with history of acoustic neuroma, anemia, BPPV, HTN, HLD, OA, OSA on CPAP, paroxysmal A-fib on Xarelto , DM2, gout, obesity brought to the hospital for evaluation of seizure.  Apparently for the past 5-6 weeks per husband patient has been having seizures in her sleep where she starts shaking, becomes unresponsive followed by confusion.  When EMS arrived she had blank stares and was unresponsive later improved.  Initial CT of the head negative, neurology team was consulted.  MRI brain, video EEG was ordered.  MRI brain showed chronic 1 cm right IAC canal mass concerning for vestibular schwannoma which patient has had since 2016 followed by outpatient [facility].  This has been stable.  EEG is negative for any seizure activity. Discharge today if cleared by neurology  Assessment & Plan:  Principal Problem:   Seizure-like activity (HCC) Active Problems:   Paroxysmal atrial fibrillation (HCC)   Diabetes (HCC)   Essential hypertension   Obstructive sleep apnea syndrome   Hyponatremia     Seizure-like activity Etiology is unclear at this time.  CT head negative.  Seen by neurology.  EEG is unremarkable -MRI brain showing 1 cm enhancement at the right IAC concerning for vestibular schwannoma   1 cm right internal auditory canal mass - This is concerning for vestibular schwannoma.  Looks like this was present on the MRI performed on 10/14/05.  Has been following E[facility] since 2012 at [facility].  Last seen February 19, 2023.  This mass has been stable and not changed in years.  They are doing periodic CTA, next  time 2027.  Defer further management to their service   Hyponatremia Likely from hydrochlorothiazide  related.  Admission sodium 129.  This is not severe.  Sodium today is 136   OSA Continue nightly CPAP.   Paroxysmal A-fib -Toprol -XL.  Eliquis.  IV  as needed   Hypertension On Toprol -XL, ARB.  IV as needed   Type 2 diabetes Last A1c 5.8 in May 2024, home meds on hold.  Sliding scale and Accu-Cheks   Hyperlipidemia Gout Allopurinol, Lipitor    DVT prophylaxis: Xarelto     Code Status: Full Code Family Communication:   Will discharge today if cleared by neurology  Subjective: Feeling well no complaints   Examination:  General exam: Appears calm and comfortable  Respiratory system: Clear to auscultation. Respiratory effort normal. Cardiovascular system: S1 & S2 heard, RRR. No JVD, murmurs, rubs, gallops or clicks. No pedal edema. Gastrointestinal system: Abdomen is nondistended, soft and nontender. No organomegaly or masses felt. Normal bowel sounds heard. Central nervous system: Alert and oriented. No focal neurological deficits. Extremities: Symmetric 5 x 5 power. Skin: No rashes, lesions or ulcers Psychiatry: Judgement and insight appear normal. Mood & affect appropriate.                Diet Orders (From admission, onward)     Start     Ordered   03/31/24 2035  Diet heart healthy/carb modified Room service appropriate? Yes; Fluid consistency: Thin  Diet effective now       Question Answer Comment  Diet-HS Snack? Nothing   Room service appropriate? Yes   Fluid consistency: Thin      03/31/24 2037            Objective: Vitals:   04/01/24 0722 04/01/24 0940 04/01/24 1000 04/01/24 1121  BP:  Aaron Aas)  132/40    Pulse:  62    Resp:  (!) 28    Temp: 97.7 F (36.5 C)   97.9 F (36.6 C)  TempSrc: Oral   Oral  SpO2:  100%    Weight:   94 kg    No intake or output data in the 24 hours ending 04/01/24 1124 Filed Weights   04/01/24 1000  Weight: 94 kg    Scheduled Meds:  allopurinol  100 mg Oral Daily   atorvastatin   40 mg Oral q morning   docusate sodium   100 mg Oral BID   insulin  aspart  0-5 Units Subcutaneous QHS   insulin  aspart  0-9 Units Subcutaneous TID WC   irbesartan   150 mg Oral Daily    metoprolol  succinate  50 mg Oral BID   rivaroxaban   20 mg Oral Q supper   Continuous Infusions:  Nutritional status     Body mass index is 37.9 kg/m.  Data Reviewed:   CBC: Recent Labs  Lab 03/31/24 1613  WBC 6.3  NEUTROABS 4.0  HGB 13.3  HCT 38.8  MCV 85.8  PLT 166   Basic Metabolic Panel: Recent Labs  Lab 03/31/24 1613 04/01/24 0535  NA 129* 136  K 4.4  --   CL 98  --   CO2 23  --   GLUCOSE 135*  --   BUN 13  --   CREATININE 0.63  --   CALCIUM  8.6*  --    GFR: Estimated Creatinine Clearance: 63.9 mL/min (by C-G formula based on SCr of 0.63 mg/dL). Liver Function Tests: Recent Labs  Lab 03/31/24 1613  AST 36  ALT 21  ALKPHOS 49  BILITOT 1.1  PROT 6.0*  ALBUMIN 3.7   No results for input(s): "LIPASE", "AMYLASE" in the last 168 hours. No results for input(s): "AMMONIA" in the last 168 hours. Coagulation Profile: No results for input(s): "INR", "PROTIME" in the last 168 hours. Cardiac Enzymes: No results for input(s): "CKTOTAL", "CKMB", "CKMBINDEX", "TROPONINI" in the last 168 hours. BNP (last 3 results) No results for input(s): "PROBNP" in the last 8760 hours. HbA1C: No results for input(s): "HGBA1C" in the last 72 hours. CBG: Recent Labs  Lab 03/31/24 1610 03/31/24 2112 03/31/24 2317 04/01/24 0743  GLUCAP 129* 100* 96 87   Lipid Profile: No results for input(s): "CHOL", "HDL", "LDLCALC", "TRIG", "CHOLHDL", "LDLDIRECT" in the last 72 hours. Thyroid Function Tests: Recent Labs    04/01/24 0535  TSH 2.955   Anemia Panel: No results for input(s): "VITAMINB12", "FOLATE", "FERRITIN", "TIBC", "IRON", "RETICCTPCT" in the last 72 hours. Sepsis Labs: No results for input(s): "PROCALCITON", "LATICACIDVEN" in the last 168 hours.  No results found for this or any previous visit (from the past 240 hours).       Radiology Studies: Overnight EEG with video Result Date: 04/01/2024 Arleene Lack, MD     04/01/2024  9:08 AM Patient Name:  KERSTIN CRUSOE MRN: [id number] Epilepsy Attending: Arleene Lack Referring Physician/Provider: Tona Francis, MD Duration: 03/31/2024 2123 to 04/01/2024 0900 Patient history: Episodes of reduced or unresponsiveness and few of shaking over last few months. Also has OSA Level of alertness: Awake AEDs during EEG study: None Technical aspects: This EEG study was done with scalp electrodes positioned according to the 10-20 International system of electrode placement. Electrical activity was reviewed with band pass filter of 1-70Hz , sensitivity of 7 uV/mm, display speed of 59mm/sec with a 60Hz  notched filter applied as appropriate. EEG data were recorded continuously  and digitally stored.  Video monitoring was available and reviewed as appropriate. Description: The posterior dominant rhythm consists of 9 Hz activity of moderate voltage (25-35 uV) seen predominantly in posterior head regions, symmetric and reactive to eye opening and eye closing. Hyperventilation and photic stimulation were not performed.   EEG was disconnected on 03/31/2024 between 2205 to 2316 for MRI and 04/01/2024 between 0117 to 0730 due to technical issues. IMPRESSION: This study is within normal limits. No seizures or epileptiform discharges were seen throughout the recording. A normal interictal EEG does not exclude the diagnosis of epilepsy. Arleene Lack   MR Brain W and Wo Contrast Result Date: 04/01/2024 CLINICAL DATA:  Initial evaluation for new onset seizure. EXAM: MRI HEAD WITHOUT AND WITH CONTRAST TECHNIQUE: Multiplanar, multiecho pulse sequences of the brain and surrounding structures were obtained without and with intravenous contrast. CONTRAST:  9mL GADAVIST GADOBUTROL 1 MMOL/ML IV SOLN COMPARISON:  CT from earlier the same day. FINDINGS: Brain: Cerebral volume within normal limits for age. Scattered T2/FLAIR hyperintensity involving the supratentorial cerebral white matter, nonspecific, but most commonly related to chronic  microvascular ischemic disease. Overall, changes are mild for age. No evidence for acute or subacute infarct. Gray-white matter differentiation maintained. No areas of chronic cortical infarction or other insult. No acute or chronic intracranial blood products. 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 No other mass lesion or abnormal enhancement. No mass effect or midline shift. Ventricles normal size without hydrocephalus. No extra-axial fluid collection. Pituitary gland and suprasellar region within normal limits. No other intrinsic temporal lobe abnormality. Vascular: Major intracranial vascular flow voids are maintained. Skull and upper cervical spine: Minimal cerebellar tonsillar ectopia without frank Chiari malformation. Bone marrow signal intensity normal. No scalp soft tissue abnormality. Sinuses/Orbits: Prior bilateral ocular lens replacement. Paranasal sinuses are largely clear. No significant mastoid effusion. Other: None. IMPRESSION: 1. No acute intracranial abnormality. 2. Mild cerebral white matter disease, nonspecific, but most commonly related to chronic microvascular ischemic disease. 3. 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 Electronically Signed   By: Virgia Griffins M.D.   On: 04/01/2024 01:42   CT Head Wo Contrast Result Date: 03/31/2024 CLINICAL DATA:  Seizure EXAM: CT HEAD WITHOUT CONTRAST TECHNIQUE: Contiguous axial images were obtained from the base of the skull through the vertex without intravenous contrast. RADIATION DOSE REDUCTION: This exam was performed according to the departmental dose-optimization program which includes automated exposure control, adjustment of the mA and/or kV according to patient size and/or use of iterative reconstruction technique. COMPARISON:  CT 05/01/2022 FINDINGS: Brain: No  acute territorial infarction, hemorrhage or intracranial mass. Stable ventricle size. Minimal white matter hypodensity likely chronic small vessel ischemic change Vascular: No hyperdense vessels.  Carotid vascular calcification Skull: Normal. Negative for fracture or focal lesion. Sinuses/Orbits: No acute finding. Other: None IMPRESSION: No CT evidence for acute intracranial abnormality. Electronically Signed   By: Esmeralda Hedge M.D.   On: 03/31/2024 18:31           LOS: 0 days   Time spent= 35 mins    Maggie Schooner, MD [facility]  If 7PM-7AM, please contact night-coverage  04/01/2024, 11:24 AM

## 2024-04-01 NOTE — Progress Notes (Signed)
 LTM VIDEO EEG discontinued - no skin breakdown at [facility]

## 2024-04-01 NOTE — Progress Notes (Addendum)
 NEUROLOGY CONSULT FOLLOW UP NOTE   Date of service: April 01, 2024 Patient Name: Erika Kim MRN:  [id number] DOB:  [birth year]  Interval Hx/subjective  Wants to go home. No spells overnight.   Vitals   Vitals:   04/01/24 0500 04/01/24 0600 04/01/24 0615 04/01/24 0722  BP: 131/70 134/69    Pulse: 60 62 60   Resp: 14 18 17    Temp:    97.7 F (36.5 C)  TempSrc:    Oral  SpO2: 100% 100% 100%      There is no height or weight on file to calculate BMI.  Physical Exam   HEENT:  [place]/AT. EEG leads in place.  Lungs: Respirations unlabored Extremities: Warm and well perfused.   Neurological Examination Mental Status: Awake, alert and oriented. Speech fluent with intact naming and comprehension.  Cranial Nerves: II, III,IV, VI: Fixates and tracks normally. No ptosis. EOMI. No nystagmus.  VII: Smile symmetric VIII: Hearing intact to conversation IX,X: No hoarseness or hypophonia XI: Symmetric XII: No lingual dysarthria Motor: Right : Upper extremity   5/5    Left:     Upper extremity   5/5  Lower extremity   5/5     Lower extremity   5/5 Cerebellar: No ataxia with FNF bilaterally Gait: Deferred   Medications  Current Facility-Administered Medications:    acetaminophen  (TYLENOL ) tablet 650 mg, 650 mg, Oral, Q6H PRN **OR** acetaminophen  (TYLENOL ) suppository 650 mg, 650 mg, Rectal, Q6H PRN, Rathore, Vasundhra, MD   allopurinol (ZYLOPRIM) tablet 100 mg, 100 mg, Oral, Daily, Amin, Ankit C, MD   atorvastatin  (LIPITOR) tablet 40 mg, 40 mg, Oral, q morning, Amin, Ankit C, MD   docusate sodium  (COLACE) capsule 100 mg, 100 mg, Oral, BID, Amin, Ankit C, MD   guaiFENesin (ROBITUSSIN) 100 MG/5ML liquid 5 mL, 5 mL, Oral, Q4H PRN, Amin, Ankit C, MD   hydrALAZINE (APRESOLINE) injection 10 mg, 10 mg, Intravenous, Q4H PRN, Amin, Ankit C, MD   insulin  aspart (novoLOG ) injection 0-5 Units, 0-5 Units, Subcutaneous, QHS, Rathore, Vasundhra, MD   insulin  aspart (novoLOG ) injection 0-9 Units,  0-9 Units, Subcutaneous, TID WC, Rathore, Vasundhra, MD   ipratropium-albuterol (DUONEB) 0.5-2.5 (3) MG/3ML nebulizer solution 3 mL, 3 mL, Nebulization, Q4H PRN, Amin, Ankit C, MD   irbesartan  (AVAPRO ) tablet 150 mg, 150 mg, Oral, Daily, Amin, Ankit C, MD   metoprolol  succinate (TOPROL -XL) 24 hr tablet 50 mg, 50 mg, Oral, BID, Amin, Ankit C, MD   metoprolol  tartrate (LOPRESSOR ) injection 5 mg, 5 mg, Intravenous, Q4H PRN, Amin, Ankit C, MD   rivaroxaban  (XARELTO ) tablet 20 mg, 20 mg, Oral, Q supper, Amin, Ankit C, MD  Current Outpatient Medications:    allopurinol (ZYLOPRIM) 100 MG tablet, Take 100 mg by mouth daily., Disp: , Rfl:    atorvastatin  (LIPITOR) 40 MG tablet, Take 40 mg by mouth every morning. , Disp: , Rfl:    betamethasone valerate (VALISONE) 0.1 % cream, Apply 1 application  topically daily as needed (dermatitis)., Disp: , Rfl:    chlorthalidone  (HYGROTON ) 25 MG tablet, Take 1 tablet (25 mg total) by mouth daily., Disp: 30 tablet, Rfl: 3   Cholecalciferol (VITAMIN D3) 5000 units TABS, Take 5,000 Units by mouth daily. , Disp: , Rfl:    Cranberry-Vitamin C (CRANBERRY CONCENTRATE/VITAMINC PO), Take 1 tablet by mouth daily. , Disp: , Rfl:    docusate sodium  (COLACE) 100 MG capsule, Take 100 mg by mouth 2 (two) times daily., Disp: , Rfl:    ipratropium (ATROVENT )  0.03 % nasal spray, Place 2 sprays into both nostrils every 12 (twelve) hours as needed for rhinitis., Disp: , Rfl:    JANUVIA 100 MG tablet, Take 100 mg by mouth daily after supper. , Disp: , Rfl:    metoprolol  succinate (TOPROL -XL) 50 MG 24 hr tablet, take 1 tablet by mouth in the morning and at bedtime with or immediately following a meal (Patient taking differently: Take 50 mg by mouth in the morning and at bedtime.), Disp: 180 tablet, Rfl: 2   Multiple Vitamin (MULTIVITAMIN) tablet, Take 1 tablet by mouth daily., Disp: , Rfl:    mupirocin  ointment (BACTROBAN ) 2 %, Apply 1 Application topically 2 (two) times daily., Disp: 22  g, Rfl: 0   olmesartan (BENICAR) 20 MG tablet, Take 20 mg by mouth daily., Disp: , Rfl:    Polyethyl Glycol-Propyl Glycol (SYSTANE OP), Place 1 drop into both eyes daily as needed (dry eyes)., Disp: , Rfl:    PRESCRIPTION MEDICATION, Inhale 1 Device into the lungs See admin instructions. CPAP- At bedtime, Disp: , Rfl:    rivaroxaban  (XARELTO ) 20 MG TABS tablet, TAKE ONE TABLET BY MOUTH ONCE A DAY WITH SUPPER, Disp: 90 tablet, Rfl: 1   saline (AYR) GEL, Place 1 application into the nose at bedtime., Disp: , Rfl:    Lancets (ONETOUCH DELICA PLUS LANCET33G) MISC, , Disp: , Rfl:    ONE TOUCH ULTRA TEST test strip, , Disp: , Rfl:   Labs and Diagnostic Imaging   CBC:  Recent Labs  Lab 03/31/24 1613  WBC 6.3  NEUTROABS 4.0  HGB 13.3  HCT 38.8  MCV 85.8  PLT 166    Basic Metabolic Panel:  Lab Results  Component Value Date   NA 136 04/01/2024   K 4.4 03/31/2024   CO2 23 03/31/2024   GLUCOSE 135 (H) 03/31/2024   BUN 13 03/31/2024   CREATININE 0.63 03/31/2024   CALCIUM  8.6 (L) 03/31/2024   GFRNONAA >60 03/31/2024   GFRAA >60 02/23/2020   Lipid Panel: No results found for: "LDLCALC" HgbA1c:  Lab Results  Component Value Date   HGBA1C 5.9 (H) 03/17/2022   Urine Drug Screen: No results found for: "LABOPIA", "COCAINSCRNUR", "LABBENZ", "AMPHETMU", "THCU", "LABBARB"  Alcohol  Level No results found for: "ETH" INR  Lab Results  Component Value Date   INR 1.53 01/10/2017   APTT  Lab Results  Component Value Date   APTT 29 01/10/2017     Assessment  78 y.o. female with above past medical history who presented to the ED yesterday for evaluation of multiple episodes concerning for seizure-like activity.  These episodes have been both staring-like events without any recollection of these episodes, as well as spells with shaking. These happen mainly at nighttime. - Exam today is nonfocal - Mild hyponatremia present on admission is now resolved. - LTM EEG report: This study is  within normal limits. No seizures or epileptiform discharges were seen throughout the recording.  - MRI brain w/wo contrast: No acute intracranial abnormality. Mild cerebral white matter disease, nonspecific, but most commonly related to chronic microvascular ischemic disease. 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 - Impression:  - Seizures versus parasomnias. Also evaluating for cardiac arrhythmias causing symptomatic changes in mentation. 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   Recommendations  - Cardiac monitoring - Hold off on using any antiseizure medications for now.   - May benefit from a Zio patch if cardiac monitoring  inpatient does not capture an episode - Outpatient Neurology follow up - Repeat MRI brain in 12 months to assess for stability of the Schwannoma - Neurohospitalist service will sign off. Please call if there are additional questions.  ______________________________________________________________________   Hope Ly, Deryk Bozman, MD [facility]

## 2024-04-01 NOTE — Discharge Summary (Signed)
 Physician Discharge Summary  Erika Kim:[id number] DOB: 04-13-1946 DOA: 03/31/2024  PCP: Bertha Broad, MD  Admit date: 03/31/2024 Discharge date: 04/01/2024  Admitted From: Home Disposition:  Home  Recommendations for Outpatient Follow-up:  Follow up with PCP in 1-2 weeks Please obtain BMP/CBC in one week your next doctors visit.  Hold Chlorthalidone  until seen by outpatient PCP Neurology follow up in one month, referral made   Discharge Condition: Stable CODE STATUS: Full Diet recommendation: Low Na  Brief/Interim Summary: Brief Narrative:   78 year old with history of acoustic neuroma, anemia, BPPV, HTN, HLD, OA, OSA on CPAP, paroxysmal A-fib on Xarelto , DM2, gout, obesity brought to the hospital for evaluation of seizure.  Apparently for the past 5-6 weeks per husband patient has been having seizures in her sleep where she starts shaking, becomes unresponsive followed by confusion.  When EMS arrived she had blank stares and was unresponsive later improved.  Initial CT of the head negative, neurology team was consulted.  MRI brain, video EEG was ordered.  MRI brain showed chronic 1 cm right IAC canal mass concerning for vestibular schwannoma which patient has had since 2016 followed by outpatient [facility].  This has been stable.  EEG is negative for any seizure activity. Discharge today if cleared by neurology  Assessment & Plan:  Principal Problem:   Seizure-like activity (HCC) Active Problems:   Paroxysmal atrial fibrillation (HCC)   Diabetes (HCC)   Essential hypertension   Obstructive sleep apnea syndrome   Hyponatremia     Seizure-like activity Etiology is unclear at this time.  CT head negative.  Seen by neurology.  EEG is unremarkable. Outptn Neurology Follow up.  -MRI brain showing 1 cm enhancement at the right IAC concerning for vestibular schwannoma   1 cm right internal auditory canal mass - This is concerning for vestibular schwannoma.  Looks like  this was present on the MRI performed on 10/14/05.  Has been following E[facility] since 2012 at [facility].  Last seen February 19, 2023.  This mass has been stable and not changed in years.  They are doing periodic CTA, next  time 2027.  Defer further management to their service   Hyponatremia Likely from hydrochlorothiazide  related.  Admission sodium 129.  This is not severe.  Sodium today is 136   OSA Continue nightly CPAP.   Paroxysmal A-fib -Toprol -XL.  Eliquis.    Hypertension On Toprol -XL, ARB.   Type 2 diabetes Last A1c 5.8 in May 2024, home meds on hold.  Sliding scale and Accu-Cheks   Hyperlipidemia Gout Allopurinol, Lipitor    DVT prophylaxis: Xarelto     Code Status: Full Code Family Communication:   DC  Subjective: Feeling well no complaints   Examination:  General exam: Appears calm and comfortable  Respiratory system: Clear to auscultation. Respiratory effort normal. Cardiovascular system: S1 & S2 heard, RRR. No JVD, murmurs, rubs, gallops or clicks. No pedal edema. Gastrointestinal system: Abdomen is nondistended, soft and nontender. No organomegaly or masses felt. Normal bowel sounds heard. Central nervous system: Alert and oriented. No focal neurological deficits. Extremities: Symmetric 5 x 5 power. Skin: No rashes, lesions or ulcers Psychiatry: Judgement and insight appear normal. Mood & affect appropriate.    Discharge Diagnoses:  Principal Problem:   Seizure-like activity (HCC) Active Problems:   Paroxysmal atrial fibrillation (HCC)   Diabetes (HCC)   Essential hypertension   Obstructive sleep apnea syndrome   Hyponatremia      Discharge Exam: Vitals:   04/01/24 1100 04/01/24  1121  BP: (!) 140/61   Pulse: (!) 56   Resp: 18   Temp:  97.9 F (36.6 C)  SpO2: 100%    Vitals:   04/01/24 0940 04/01/24 1000 04/01/24 1100 04/01/24 1121  BP: (!) 132/40  (!) 140/61   Pulse: 62  (!) 56   Resp: (!) 28  18   Temp:    97.9 F (36.6 C)  TempSrc:     Oral  SpO2: 100%  100%   Weight:  94 kg        Discharge Instructions  Discharge Instructions     Ambulatory referral to Neurology   Complete by: As directed    An appointment is requested in approximately: 4 weeks Dx potential Seizure.      Allergies as of 04/01/2024       Reactions   Cephalexin Itching   Received 2 grams ancef  03/28/2022 without problem   Augmentin [amoxicillin-pot Clavulanate] Itching   severe   Cephalosporins Itching   severe        Medication List     PAUSE taking these medications    chlorthalidone  25 MG tablet Wait to take this until your doctor or other care provider tells you to start again. Commonly known as: HYGROTON  Take 1 tablet (25 mg total) by mouth daily.       STOP taking these medications    doxycycline  100 MG tablet Commonly known as: VIBRA -TABS       TAKE these medications    allopurinol 100 MG tablet Commonly known as: ZYLOPRIM Take 100 mg by mouth daily.   atorvastatin  40 MG tablet Commonly known as: LIPITOR Take 40 mg by mouth every morning.   betamethasone valerate 0.1 % cream Commonly known as: VALISONE Apply 1 application  topically daily as needed (dermatitis).   CRANBERRY CONCENTRATE/VITAMINC PO Take 1 tablet by mouth daily.   docusate sodium  100 MG capsule Commonly known as: COLACE Take 100 mg by mouth 2 (two) times daily.   ipratropium 0.03 % nasal spray Commonly known as: ATROVENT  Place 2 sprays into both nostrils every 12 (twelve) hours as needed for rhinitis.   Januvia 100 MG tablet Generic drug: sitaGLIPtin Take 100 mg by mouth daily after supper.   metoprolol  succinate 50 MG 24 hr tablet Commonly known as: TOPROL -XL take 1 tablet by mouth in the morning and at bedtime with or immediately following a meal What changed: See the new instructions.   multivitamin tablet Take 1 tablet by mouth daily.   mupirocin  ointment 2 % Commonly known as: BACTROBAN  Apply 1 Application topically 2  (two) times daily.   olmesartan 20 MG tablet Commonly known as: BENICAR Take 20 mg by mouth daily.   ONE TOUCH ULTRA TEST test strip Generic drug: glucose blood   OneTouch Delica Plus Lancet33G Misc   PRESCRIPTION MEDICATION Inhale 1 Device into the lungs See admin instructions. CPAP- At bedtime   saline Gel Place 1 application into the nose at bedtime.   SYSTANE OP Place 1 drop into both eyes daily as needed (dry eyes).   Vitamin D3 125 MCG (5000 UT) Tabs Take 5,000 Units by mouth daily.   Xarelto  20 MG Tabs tablet Generic drug: rivaroxaban  TAKE ONE TABLET BY MOUTH ONCE A DAY WITH SUPPER        Follow-up Information     Bertha Broad, MD Follow up in 1 week(s).   Specialty: Internal Medicine Contact information: [address] [place] [address] [phone]  Allergies  Allergen Reactions   Cephalexin Itching    Received 2 grams ancef  03/28/2022 without problem   Augmentin [Amoxicillin-Pot Clavulanate] Itching    severe     Cephalosporins Itching    severe    You were cared for by a hospitalist during your hospital stay. If you have any questions about your discharge medications or the care you received while you were in the hospital after you are discharged, you can call the unit and asked to speak with the hospitalist on call if the hospitalist that took care of you is not available. Once you are discharged, your primary care physician will handle any further medical issues. Please note that no refills for any discharge medications will be authorized once you are discharged, as it is imperative that you return to your primary care physician (or establish a relationship with a primary care physician if you do not have one) for your aftercare needs so that they can reassess your need for medications and monitor your lab values.  You were cared for by a hospitalist during your hospital stay. If you have any questions about your  discharge medications or the care you received while you were in the hospital after you are discharged, you can call the unit and asked to speak with the hospitalist on call if the hospitalist that took care of you is not available. Once you are discharged, your primary care physician will handle any further medical issues. Please note that NO REFILLS for any discharge medications will be authorized once you are discharged, as it is imperative that you return to your primary care physician (or establish a relationship with a primary care physician if you do not have one) for your aftercare needs so that they can reassess your need for medications and monitor your lab values.  Please request your Prim.MD to go over all Hospital Tests and Procedure/Radiological results at the follow up, please get all Hospital records sent to your Prim MD by signing hospital release before you go home.  Get CBC, CMP, 2 view Chest X ray checked  by Primary MD during your next visit or SNF MD in 5-7 days ( we routinely change or add medications that can affect your baseline labs and fluid status, therefore we recommend that you get the mentioned basic workup next visit with your PCP, your PCP may decide not to get them or add new tests based on their clinical decision)  On your next visit with your primary care physician please Get Medicines reviewed and adjusted.  If you experience worsening of your admission symptoms, develop shortness of breath, life threatening emergency, suicidal or homicidal thoughts you must seek medical attention immediately by calling 911 or calling your MD immediately  if symptoms less severe.  You Must read complete instructions/literature along with all the possible adverse reactions/side effects for all the Medicines you take and that have been prescribed to you. Take any new Medicines after you have completely understood and accpet all the possible adverse reactions/side effects.   Do not  drive, operate heavy machinery, perform activities at heights, swimming or participation in water  activities or provide baby sitting services if your were admitted for syncope or siezures until you have seen by Primary MD or a Neurologist and advised to do so again.  Do not drive when taking Pain medications.   Procedures/Studies: Overnight EEG with video Result Date: 04/01/2024 Arleene Lack, MD     04/01/2024  9:08 AM Patient Name:  Erika Kim MRN: [id number] Epilepsy Attending: Arleene Lack Referring Physician/Provider: Tona Francis, MD Duration: 03/31/2024 2123 to 04/01/2024 0900 Patient history: Episodes of reduced or unresponsiveness and few of shaking over last few months. Also has OSA Level of alertness: Awake AEDs during EEG study: None Technical aspects: This EEG study was done with scalp electrodes positioned according to the 10-20 International system of electrode placement. Electrical activity was reviewed with band pass filter of 1-70Hz , sensitivity of 7 uV/mm, display speed of 53mm/sec with a 60Hz  notched filter applied as appropriate. EEG data were recorded continuously and digitally stored.  Video monitoring was available and reviewed as appropriate. Description: The posterior dominant rhythm consists of 9 Hz activity of moderate voltage (25-35 uV) seen predominantly in posterior head regions, symmetric and reactive to eye opening and eye closing. Hyperventilation and photic stimulation were not performed.   EEG was disconnected on 03/31/2024 between 2205 to 2316 for MRI and 04/01/2024 between 0117 to 0730 due to technical issues. IMPRESSION: This study is within normal limits. No seizures or epileptiform discharges were seen throughout the recording. A normal interictal EEG does not exclude the diagnosis of epilepsy. Arleene Lack   MR Brain W and Wo Contrast Result Date: 04/01/2024 CLINICAL DATA:  Initial evaluation for new onset seizure. EXAM: MRI HEAD WITHOUT AND WITH CONTRAST  TECHNIQUE: Multiplanar, multiecho pulse sequences of the brain and surrounding structures were obtained without and with intravenous contrast. CONTRAST:  9mL GADAVIST GADOBUTROL 1 MMOL/ML IV SOLN COMPARISON:  CT from earlier the same day. FINDINGS: Brain: Cerebral volume within normal limits for age. Scattered T2/FLAIR hyperintensity involving the supratentorial cerebral white matter, nonspecific, but most commonly related to chronic microvascular ischemic disease. Overall, changes are mild for age. No evidence for acute or subacute infarct. Gray-white matter differentiation maintained. No areas of chronic cortical infarction or other insult. No acute or chronic intracranial blood products. 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 No other mass lesion or abnormal enhancement. No mass effect or midline shift. Ventricles normal size without hydrocephalus. No extra-axial fluid collection. Pituitary gland and suprasellar region within normal limits. No other intrinsic temporal lobe abnormality. Vascular: Major intracranial vascular flow voids are maintained. Skull and upper cervical spine: Minimal cerebellar tonsillar ectopia without frank Chiari malformation. Bone marrow signal intensity normal. No scalp soft tissue abnormality. Sinuses/Orbits: Prior bilateral ocular lens replacement. Paranasal sinuses are largely clear. No significant mastoid effusion. Other: None. IMPRESSION: 1. No acute intracranial abnormality. 2. Mild cerebral white matter disease, nonspecific, but most commonly related to chronic microvascular ischemic disease. 3. 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 Electronically Signed   By: Virgia Griffins M.D.   On: 04/01/2024 01:42   CT Head Wo Contrast Result Date: 03/31/2024 CLINICAL DATA:  Seizure EXAM: CT HEAD WITHOUT  CONTRAST TECHNIQUE: Contiguous axial images were obtained from the base of the skull through the vertex without intravenous contrast. RADIATION DOSE REDUCTION: This exam was performed according to the departmental dose-optimization program which includes automated exposure control, adjustment of the mA and/or kV according to patient size and/or use of iterative reconstruction technique. COMPARISON:  CT 05/01/2022 FINDINGS: Brain: No acute territorial infarction, hemorrhage or intracranial mass. Stable ventricle size. Minimal white matter hypodensity likely chronic small vessel ischemic change Vascular: No hyperdense vessels.  Carotid vascular calcification Skull: Normal. Negative for fracture or focal lesion. Sinuses/Orbits:  No acute finding. Other: None IMPRESSION: No CT evidence for acute intracranial abnormality. Electronically Signed   By: Esmeralda Hedge M.D.   On: 03/31/2024 18:31     The results of significant diagnostics from this hospitalization (including imaging, microbiology, ancillary and laboratory) are listed below for reference.     Microbiology: No results found for this or any previous visit (from the past 240 hours).   Labs: BNP (last 3 results) No results for input(s): "BNP" in the last 8760 hours. Basic Metabolic Panel: Recent Labs  Lab 03/31/24 1613 04/01/24 0535  NA 129* 136  K 4.4  --   CL 98  --   CO2 23  --   GLUCOSE 135*  --   BUN 13  --   CREATININE 0.63  --   CALCIUM  8.6*  --    Liver Function Tests: Recent Labs  Lab 03/31/24 1613  AST 36  ALT 21  ALKPHOS 49  BILITOT 1.1  PROT 6.0*  ALBUMIN 3.7   No results for input(s): "LIPASE", "AMYLASE" in the last 168 hours. No results for input(s): "AMMONIA" in the last 168 hours. CBC: Recent Labs  Lab 03/31/24 1613  WBC 6.3  NEUTROABS 4.0  HGB 13.3  HCT 38.8  MCV 85.8  PLT 166   Cardiac Enzymes: No results for input(s): "CKTOTAL", "CKMB", "CKMBINDEX", "TROPONINI" in the last 168  hours. BNP: Invalid input(s): "POCBNP" CBG: Recent Labs  Lab 03/31/24 1610 03/31/24 2112 03/31/24 2317 04/01/24 0743 04/01/24 1154  GLUCAP 129* 100* 96 87 89   D-Dimer No results for input(s): "DDIMER" in the last 72 hours. Hgb A1c No results for input(s): "HGBA1C" in the last 72 hours. Lipid Profile No results for input(s): "CHOL", "HDL", "LDLCALC", "TRIG", "CHOLHDL", "LDLDIRECT" in the last 72 hours. Thyroid function studies Recent Labs    04/01/24 0535  TSH 2.955   Anemia work up No results for input(s): "VITAMINB12", "FOLATE", "FERRITIN", "TIBC", "IRON", "RETICCTPCT" in the last 72 hours. Urinalysis    Component Value Date/Time   COLORURINE STRAW (A) 02/23/2020 1101   APPEARANCEUR CLEAR 02/23/2020 1101   LABSPEC 1.003 (L) 02/23/2020 1101   PHURINE 8.0 02/23/2020 1101   GLUCOSEU NEGATIVE 02/23/2020 1101   HGBUR NEGATIVE 02/23/2020 1101   BILIRUBINUR NEGATIVE 02/23/2020 1101   KETONESUR NEGATIVE 02/23/2020 1101   PROTEINUR NEGATIVE 02/23/2020 1101   UROBILINOGEN 0.2 01/12/2010 0939   NITRITE NEGATIVE 02/23/2020 1101   LEUKOCYTESUR TRACE (A) 02/23/2020 1101   Sepsis Labs Recent Labs  Lab 03/31/24 1613  WBC 6.3   Microbiology No results found for this or any previous visit (from the past 240 hours).   Time coordinating discharge:  I have spent 35 minutes face to face with the patient and on the ward discussing the patients care, assessment, plan and disposition with other care givers. >50% of the time was devoted counseling the patient about the risks and benefits of treatment/Discharge disposition and coordinating care.   SIGNED:   Maggie Schooner, MD  [facility] 04/01/2024, 12:47 PM   If 7PM-7AM, please contact night-coverage

## 2024-04-01 NOTE — Procedures (Addendum)
 Patient Name: Erika Kim  MRN: [id number]  Epilepsy Attending: Arleene Lack  Referring Physician/Provider: Tona Francis, MD  Duration: 03/31/2024 2123 to 04/01/2024 0347  Patient history: Episodes of reduced or unresponsiveness and few of shaking over last few months. Also has OSA. EEG to evaluate for seizure.   Level of alertness: Awake  AEDs during EEG study: None  Technical aspects: This EEG study was done with scalp electrodes positioned according to the 10-20 International system of electrode placement. Electrical activity was reviewed with band pass filter of 1-70Hz , sensitivity of 7 uV/mm, display speed of 92mm/sec with a 60Hz  notched filter applied as appropriate. EEG data were recorded continuously and digitally stored.  Video monitoring was available and reviewed as appropriate.  Description: The posterior dominant rhythm consists of 9 Hz activity of moderate voltage (25-35 uV) seen predominantly in posterior head regions, symmetric and reactive to eye opening and eye closing. Hyperventilation and photic stimulation were not performed.     EEG was disconnected on 03/31/2024 between 2205 to 2316 for MRI and 04/01/2024 between 0117 to 0730 due to technical issues.  IMPRESSION: This study is within normal limits. No seizures or epileptiform discharges were seen throughout the recording.  A normal interictal EEG does not exclude the diagnosis of epilepsy.  Jaquavious Mercer O Aleric Froelich

## 2024-04-02 LAB — HEMOGLOBIN A1C
Hgb A1c MFr Bld: 6 % — ABNORMAL HIGH (ref 4.8–5.6)
Mean Plasma Glucose: 126 mg/dL

## 2024-04-08 ENCOUNTER — Ambulatory Visit: Admitting: Podiatry

## 2024-04-08 DIAGNOSIS — L600 Ingrowing nail: Secondary | ICD-10-CM

## 2024-04-08 MED ORDER — NEOMYCIN-POLYMYXIN-HC 1 % OT SOLN: OTIC | 1 refills | Status: DC

## 2024-04-08 NOTE — Patient Instructions (Signed)

## 2024-04-09 NOTE — Progress Notes (Signed)
 She presents today chief complaint of ingrown toenail to the third digit lateral border.  She states that she has been on antibiotics.  Objective: Vital signs are stable alert and oriented x 3.  Pulses are palpable.  There is mild erythema there is remnants of an abscess along the lateral border of the third digit left.  Assessment: Ingrown toenail lateral border third digit left.  Plan: I&D was performed today after local anesthetic was administered.  The nail was prepped.  The lateral border of the nail was split from distal to proximal avulsing the nail and removing any necrotic tissue and abscess that was present.  The area was flushed.  Betadine  ointment dry sterile compressive dressing was applied we will start soaking tomorrow was given both oral and written home-going instructions continue antibiotics with Corticosporin otic.

## 2024-04-10 DIAGNOSIS — D3610 Benign neoplasm of peripheral nerves and autonomic nervous system, unspecified: Secondary | ICD-10-CM | POA: Diagnosis not present

## 2024-04-10 DIAGNOSIS — G4733 Obstructive sleep apnea (adult) (pediatric): Secondary | ICD-10-CM | POA: Diagnosis not present

## 2024-04-10 DIAGNOSIS — R569 Unspecified convulsions: Secondary | ICD-10-CM | POA: Diagnosis not present

## 2024-04-10 DIAGNOSIS — I10 Essential (primary) hypertension: Secondary | ICD-10-CM | POA: Diagnosis not present

## 2024-04-10 DIAGNOSIS — I480 Paroxysmal atrial fibrillation: Secondary | ICD-10-CM | POA: Diagnosis not present

## 2024-04-10 DIAGNOSIS — E871 Hypo-osmolality and hyponatremia: Secondary | ICD-10-CM | POA: Diagnosis not present

## 2024-04-10 DIAGNOSIS — E1151 Type 2 diabetes mellitus with diabetic peripheral angiopathy without gangrene: Secondary | ICD-10-CM | POA: Diagnosis not present

## 2024-04-22 DIAGNOSIS — M20032 Swan-neck deformity of left finger(s): Secondary | ICD-10-CM | POA: Diagnosis not present

## 2024-04-22 DIAGNOSIS — S62633A Displaced fracture of distal phalanx of left middle finger, initial encounter for closed fracture: Secondary | ICD-10-CM | POA: Diagnosis not present

## 2024-04-22 DIAGNOSIS — M20012 Mallet finger of left finger(s): Secondary | ICD-10-CM | POA: Diagnosis not present

## 2024-04-23 ENCOUNTER — Encounter: Payer: Self-pay | Admitting: Neurology

## 2024-04-23 ENCOUNTER — Ambulatory Visit: Admitting: Neurology

## 2024-04-23 VITALS — BP 134/69 | HR 63 | Resp 15 | Ht 62.0 in | Wt 208.0 lb

## 2024-04-23 DIAGNOSIS — G40909 Epilepsy, unspecified, not intractable, without status epilepticus: Secondary | ICD-10-CM

## 2024-04-23 DIAGNOSIS — G250 Essential tremor: Secondary | ICD-10-CM | POA: Diagnosis not present

## 2024-04-23 MED ORDER — LEVETIRACETAM 250 MG PO TABS: 250.0000 mg | ORAL_TABLET | Freq: Two times a day (BID) | ORAL | 3 refills | Status: AC

## 2024-04-23 NOTE — Progress Notes (Signed)
 [facility]  PATIENT: Erika Kim DOB: 06/27/1946  REQUESTING CLINICIAN: Yolande Toribio MATSU, MD HISTORY FROM: Patient/Husband  REASON FOR VISIT: Seizure    HISTORICAL  CHIEF COMPLAINT:  Chief Complaint  Patient presents with   New Patient (Initial Visit)    Rm13, husband present, Internal referral for potential seizure: pt husband stated that pt had a convulsive sz last night     HISTORY OF PRESENT ILLNESS:  This is a 78 year old woman past medical history of hypertension, hyperlipidemia, atrial fibrillation who is presenting after a few nocturnal seizures.  Husband stated during sleep she will have generalized shaking, associated with heavy breathing and unresponsiveness.  She had a total of 3 events last episode being last night, July 1.  Prior to that it was on June 10.  They presented to the hospital, had a full workup including MRI and EEG and it was unrevealing.  Patient was not started on medication at that time.  She was doing well from June 10, until July 1 when again she did have a seizure out of sleep.  Husband tells me in the morning patient does not remember the event, her last event was last night so today she is very tired.  She denies any injury such as tongue biting or urinary incontinence and no falling out of bed.  Denies any family history of seizure denies any other seizure risk factors.  Of note she was noted to have voice tremor, and action tremors. On occasion she will drop her food on the fork when eating.  Denies any other symptoms associated with action tremor however her paternal grandfather has very bad tremor and she used to feed him because he could not feed himself.  Handedness: Right handed   Onset: 2 months ago   Seizure Type: Nocturnal convulsion   Current frequency: 3 events, last event  July 1  Any injuries from seizures: Denies  Seizure risk factors: Denies   Previous ASMs: None   Currenty ASMs: None   ASMs side  effects: N/A  Brain Images: No acute abnormality   Previous EEGs: Normal    OTHER MEDICAL CONDITIONS: Hypertension, Hyperlipidemia, Atrial fibrillation  REVIEW OF SYSTEMS: Full 14 system review of systems performed and negative with exception of: As noted in the HPI   ALLERGIES: Allergies  Allergen Reactions   Cephalexin Itching    Received 2 grams ancef  03/28/2022 without problem   Chlorthalidone      Other Reaction(s): seizures   Augmentin [Amoxicillin-Pot Clavulanate] Itching    severe     Cephalosporins Itching    severe    HOME MEDICATIONS: Outpatient Medications Prior to Visit  Medication Sig Dispense Refill   allopurinol  (ZYLOPRIM ) 100 MG tablet Take 100 mg by mouth daily.     atorvastatin  (LIPITOR) 40 MG tablet Take 40 mg by mouth every morning.      betamethasone valerate (VALISONE) 0.1 % cream Apply 1 application  topically daily as needed (dermatitis).     Cholecalciferol (VITAMIN D3) 5000 units TABS Take 5,000 Units by mouth daily.      Cranberry-Vitamin C (CRANBERRY CONCENTRATE/VITAMINC PO) Take 1 tablet by mouth daily.      docusate sodium  (COLACE) 100 MG capsule Take 100 mg by mouth 2 (two) times daily.     ipratropium (ATROVENT ) 0.03 % nasal spray Place 2 sprays into both nostrils every 12 (twelve) hours as needed for rhinitis.     JANUVIA 100 MG tablet Take 100 mg by mouth daily after  supper.      Lancets (ONETOUCH DELICA PLUS LANCET33G) MISC      metoprolol  succinate (TOPROL -XL) 50 MG 24 hr tablet take 1 tablet by mouth in the morning and at bedtime with or immediately following a meal (Patient taking differently: Take 50 mg by mouth in the morning and at bedtime.) 180 tablet 2   Multiple Vitamin (MULTIVITAMIN) tablet Take 1 tablet by mouth daily.     mupirocin  ointment (BACTROBAN ) 2 % Apply 1 Application topically 2 (two) times daily. 22 g 0   NEOMYCIN -POLYMYXIN-HYDROCORTISONE (CORTISPORIN) 1 % SOLN OTIC solution Apply 1-2 drops to toe BID after soaking 10 mL  1   olmesartan (BENICAR) 20 MG tablet Take 20 mg by mouth daily.     ONE TOUCH ULTRA TEST test strip      Polyethyl Glycol-Propyl Glycol (SYSTANE OP) Place 1 drop into both eyes daily as needed (dry eyes).     PRESCRIPTION MEDICATION Inhale 1 Device into the lungs See admin instructions. CPAP- At bedtime     rivaroxaban  (XARELTO ) 20 MG TABS tablet TAKE ONE TABLET BY MOUTH ONCE A DAY WITH SUPPER 90 tablet 1   saline (AYR) GEL Place 1 application into the nose at bedtime.     chlorthalidone  (HYGROTON ) 25 MG tablet Take 1 tablet (25 mg total) by mouth daily. 30 tablet 3   No facility-administered medications prior to visit.    PAST MEDICAL HISTORY: Past Medical History:  Diagnosis Date   Acoustic neuroma (HCC) followed by dr brown at [facility]   right side w/ chronic roaring noise   Anemia    hx of 2019 realted to Xarelto     Anticoagulant long-term use    xarelto    Benign paroxysmal positional vertigo    Dysrhythmia    afib- 2 ablations followed by Dr Jeffrie - ar[place] - 4/21    Endometrial polyp    Family history of adverse reaction to anesthesia    daughter- nausea    Frequency of urination    Hx of transfusion of packed red blood cells    related to small intestine in 2019    Hyperlipidemia    Hypertension    OA (osteoarthritis)    left knee   OSA on CPAP    cpap - setting at ? 8    PAF (paroxysmal atrial fibrillation) [facility]) cardiologist-  dr jeffrie   dx 04/ 2017   PMB (postmenopausal bleeding)    TMJ (temporomandibular joint disorder)    left side-- wears guard   Type 2 diabetes mellitus (HCC)    Wears contact lenses     PAST SURGICAL HISTORY: Past Surgical History:  Procedure Laterality Date   ATRIAL FIBRILLATION ABLATION N/A 05/29/2019   Procedure: ATRIAL FIBRILLATION ABLATION;  Surgeon: Inocencio Soyla Lunger, MD;  Location: [facility];  Service: Cardiovascular;  Laterality: N/A;   ATRIAL FIBRILLATION ABLATION N/A 10/29/2019   Procedure: ATRIAL FIBRILLATION  ABLATION;  Surgeon: Inocencio Soyla Lunger, MD;  Location: [facility];  Service: Cardiovascular;  Laterality: N/A;   CARDIOVERSION N/A 06/24/2019   Procedure: CARDIOVERSION;  Surgeon: Raford Riggs, MD;  Location: [facility] ENDOSCOPY;  Service: Cardiovascular;  Laterality: N/A;   CARPAL TUNNEL RELEASE Right 04/17/2007   w/ Excision ganglion cyst and Pulley Release right thumb   DILATATION & CURETTAGE/HYSTEROSCOPY WITH MYOSURE N/A 01/12/2017   Procedure: DILATATION & CURETTAGE/HYSTEROSCOPY WITH MYOSURE;  Surgeon: Norleen Skill, MD;  Location: [facility];  Service: Gynecology;  Laterality: N/A;   ESOPHAGOGASTRODUODENOSCOPY (EGD)  WITH PROPOFOL  N/A 09/16/2018   Procedure: ESOPHAGOGASTRODUODENOSCOPY (EGD) WITH PROPOFOL ;  Surgeon: Donnald Charleston, MD;  Location: [facility] ENDOSCOPY;  Service: Endoscopy;  Laterality: N/A;   GIVENS CAPSULE STUDY N/A 09/16/2018   Procedure: GIVENS CAPSULE STUDY;  Surgeon: Donnald Charleston, MD;  Location: [facility] ENDOSCOPY;  Service: Endoscopy;  Laterality: N/A;   HAMMER TOE SURGERY Left 2010   left second toe   IR RADIOLOGY PERIPHERAL GUIDED IV START  10/27/2019   IR US  GUIDE VASC ACCESS RIGHT  10/27/2019   KNEE ARTHROSCOPY Right 2010   KNEE SURGERY Left 1993   LYMPH NODE BIOPSY N/A 03/01/2017   Procedure: SENTINEL LYMPH NODE BIOPSY;  Surgeon: Eloy Herring, MD;  Location: [facility];  Service: Gynecology;  Laterality: N/A;   NASAL SINUS SURGERY  1985 and 1995   REVERSE SHOULDER ARTHROPLASTY Left 03/28/2022   Procedure: REVERSE SHOULDER ARTHROPLASTY;  Surgeon: Josefina Chew, MD;  Location: [facility];  Service: Orthopedics;  Laterality: Left;   ROBOTIC ASSISTED TOTAL HYSTERECTOMY WITH BILATERAL SALPINGO OOPHERECTOMY Bilateral 03/01/2017   Procedure: XI ROBOTIC ASSISTED TOTAL HYSTERECTOMY WITH BILATERAL SALPINGO OOPHORECTOMY;  Surgeon: Eloy Herring, MD;  Location: [facility];  Service: Gynecology;  Laterality: Bilateral;   ROTATOR CUFF REPAIR Right 04/2004   TOE SURGERY Right  2012   Great toe and second toe   TOTAL ELBOW REPLACEMENT Right 2000   TOTAL KNEE ARTHROPLASTY Right 01/12/2010   TOTAL KNEE ARTHROPLASTY Left 03/02/2020   Procedure: TOTAL KNEE ARTHROPLASTY;  Surgeon: Beverley Evalene BIRCH, MD;  Location: [facility];  Service: Orthopedics;  Laterality: Left;   TRANSTHORACIC ECHOCARDIOGRAM  03/28/2016   ef 60-65%/ mild AR and MR   TUBAL LIGATION Bilateral 1978   ULNAR NERVE TRANSPOSITION Right 04/21/2021   Procedure: RIGHT ULNAR NERVE DECOMPRESSION ELBOW;  Surgeon: Murrell Drivers, MD;  Location: [facility];  Service: Orthopedics;  Laterality: Right;  axillary block    FAMILY HISTORY: Family History  Problem Relation Age of Onset   Heart failure Mother    Heart failure Father    Sleep apnea Brother     SOCIAL HISTORY: Social History   Socioeconomic History   Marital status: Married    Spouse name: Not on file   Number of children: 2   Years of education: 12   Highest education level: Not on file  Occupational History   Not on file  Tobacco Use   Smoking status: Never    Passive exposure: Never   Smokeless tobacco: Never  Vaping Use   Vaping status: Never Used  Substance and Sexual Activity   Alcohol  use: No   Drug use: No   Sexual activity: Yes  Other Topics Concern   Not on file  Social History Narrative   Married w/2 children (boy and girl).  Retired.   Social Drivers of Corporate investment banker Strain: Not on file  Food Insecurity: Low Risk  (01/08/2024)   Received from [facility]   Hunger Vital Sign    Within the past 12 months, you worried that your food would run out before you got money to buy more: Never true    Within the past 12 months, the food you bought just didn't last and you didn't have money to get more. : Never true  Transportation Needs: No Transportation Needs (01/08/2024)   Received from [facility]    In the past 12 months, has lack of reliable transportation kept you from  medical appointments, meetings, work or from getting things  needed for daily living? : No  Physical Activity: Not on file  Stress: Not on file  Social Connections: Not on file  Intimate Partner Violence: Not on file     PHYSICAL EXAM   GENERAL EXAM/CONSTITUTIONAL: Vitals:  Vitals:   04/23/24 1041  BP: 134/69  Pulse: 63  Resp: 15  SpO2: 97%  Weight: 208 lb (94.3 kg)  Height: 5' 2 (1.575 m)   Body mass index is 38.04 kg/m. Wt Readings from Last 3 Encounters:  04/23/24 208 lb (94.3 kg)  04/01/24 207 lb 3.7 oz (94 kg)  03/10/24 208 lb (94.3 kg)   Patient is in no distress; well developed, nourished and groomed; neck is supple  MUSCULOSKELETAL: Gait, strength, tone, movements noted in Neurologic exam below  NEUROLOGIC: MENTAL STATUS:      No data to display         awake, alert, oriented to person, place and time recent and remote memory intact normal attention and concentration language fluent, comprehension intact, naming intact fund of knowledge appropriate  CRANIAL NERVE:  2nd, 3rd, 4th, 6th - Visual fields full to confrontation, extraocular muscles intact, no nystagmus 5th - facial sensation symmetric 7th - facial strength symmetric 8th - hearing intact 9th - palate elevates symmetrically, uvula midline 11th - shoulder shrug symmetric 12th - tongue protrusion midline  MOTOR:  normal bulk and tone, full strength in the BUE, BLE  SENSORY:  normal and symmetric to light touch  COORDINATION:  Action tremors noted with FNF   GAIT/STATION:  normal    DIAGNOSTIC DATA (LABS, IMAGING, TESTING) - I reviewed patient records, labs, notes, testing and imaging myself where available.  Lab Results  Component Value Date   WBC 6.3 03/31/2024   HGB 13.3 03/31/2024   HCT 38.8 03/31/2024   MCV 85.8 03/31/2024   PLT 166 03/31/2024      Component Value Date/Time   NA 136 04/01/2024 0535   NA 134 03/24/2024 1553   K 4.4 03/31/2024 1613   CL 98  03/31/2024 1613   CO2 23 03/31/2024 1613   GLUCOSE 135 (H) 03/31/2024 1613   BUN 13 03/31/2024 1613   BUN 9 03/24/2024 1553   CREATININE 0.63 03/31/2024 1613   CALCIUM  8.6 (L) 03/31/2024 1613   PROT 6.0 (L) 03/31/2024 1613   PROT 6.7 03/24/2024 1553   ALBUMIN 3.7 03/31/2024 1613   ALBUMIN 4.6 03/24/2024 1553   AST 36 03/31/2024 1613   ALT 21 03/31/2024 1613   ALKPHOS 49 03/31/2024 1613   BILITOT 1.1 03/31/2024 1613   BILITOT 0.5 03/24/2024 1553   GFRNONAA >60 03/31/2024 1613   GFRAA >60 02/23/2020 1101   No results found for: CHOL, HDL, LDLCALC, LDLDIRECT, TRIG Lab Results  Component Value Date   HGBA1C 6.0 (H) 04/01/2024   Lab Results  Component Value Date   VITAMINB12 222 09/17/2018   Lab Results  Component Value Date   TSH 2.955 04/01/2024    MRI Brain 03/31/2024 1. No acute intracranial abnormality. 2. Mild cerebral white matter disease, nonspecific, but most commonly related to chronic microvascular ischemic disease. 3. 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   Overnight EEG 04/01/2024 This study is within normal limits. No seizures or epileptiform discharges were seen throughout the recording.     ASSESSMENT AND PLAN  78 y.o. year old female  with hypertension, hyperlipidemia, atrial fibrillation, who is presenting with at least 3 nocturnal event  described as generalized shaking, grunting noise and unresponsiveness.  There were no injuries associated with this event.  Based on description, these are likely nocturnal seizures.  I will start patient on levetiracetam 250 mg twice daily given the whole workup is unrevealing.  Advised family to contact me if she does have any breakthrough seizure.  I will see her in 6 months for follow-up or sooner if worse.  We also discussed driving restriction for the next 6 months she voiced understanding. When it comes to her tremors,  clinically this is benign essential tremor.  At this time patient tells me the tremor is not interfering with her day-to-day activity therefore we will hold treatment.  Advised him to contact me if her symptoms do get worse.   1. Nonintractable epilepsy without status epilepticus, unspecified epilepsy type (HCC)   2. Benign essential tremor     Patient Instructions  Start Levetiracetam 250 mg twice daily, side effect including somnolence, tiredness, irritability Continue your other medications Please contact me if you do have any breakthrough seizures Contact me if the tremors do get worse Discussed driving restriction for next 6 months, she voiced understanding Follow-up in 6 months or sooner if worse.   Per [place] statutes, patients with seizures are not allowed to drive until they have been seizure-free for six months.  Other recommendations include using caution when using heavy equipment or power tools. Avoid working on ladders or at heights. Take showers instead of baths.  Do not swim alone.  Ensure the water  temperature is not too high on the home water  heater. Do not go swimming alone. Do not lock yourself in a room alone (i.e. bathroom). When caring for infants or small children, sit down when holding, feeding, or changing them to minimize risk of injury to the child in the event you have a seizure. Maintain good sleep hygiene. Avoid alcohol .  Also recommend adequate sleep, hydration, good diet and minimize stress.   During the Seizure  - First, ensure adequate ventilation and place patients on the floor on their left side  Loosen clothing around the neck and ensure the airway is patent. If the patient is clenching the teeth, do not force the mouth open with any object as this can cause severe damage - Remove all items from the surrounding that can be hazardous. The patient may be oblivious to what's happening and may not even know what he or she is doing. If the patient  is confused and wandering, either gently guide him/her away and block access to outside areas - Reassure the individual and be comforting - Call 911. In most cases, the seizure ends before EMS arrives. However, there are cases when seizures may last over 3 to 5 minutes. Or the individual may have developed breathing difficulties or severe injuries. If a pregnant patient or a person with diabetes develops a seizure, it is prudent to call an ambulance. - Finally, if the patient does not regain full consciousness, then call EMS. Most patients will remain confused for about 45 to 90 minutes after a seizure, so you must use judgment in calling for help. - Avoid restraints but make sure the patient is in a bed with padded side rails - Place the individual in a lateral position with the neck slightly flexed; this will help the saliva drain from the mouth and prevent the tongue from falling backward - Remove all nearby furniture and other hazards from the area - Provide verbal assurance as the  individual is regaining consciousness - Provide the patient with privacy if possible - Call for help and start treatment as ordered by the caregiver   After the Seizure (Postictal Stage)  After a seizure, most patients experience confusion, fatigue, muscle pain and/or a headache. Thus, one should permit the individual to sleep. For the next few days, reassurance is essential. Being calm and helping reorient the person is also of importance.  Most seizures are painless and end spontaneously. Seizures are not harmful to others but can lead to complications such as stress on the lungs, brain and the heart. Individuals with prior lung problems may develop labored breathing and respiratory distress.    Discussed Patients with epilepsy have a small risk of sudden unexpected death, a condition referred to as sudden unexpected death in epilepsy (SUDEP). SUDEP is defined specifically as the sudden, unexpected, witnessed or  unwitnessed, nontraumatic and nondrowning death in patients with epilepsy with or without evidence for a seizure, and excluding documented status epilepticus, in which post mortem examination does not reveal a structural or toxicologic cause for death     No orders of the defined types were placed in this encounter.   Meds ordered this encounter  Medications   levETIRAcetam (KEPPRA) 250 MG tablet    Sig: Take 1 tablet (250 mg total) by mouth 2 (two) times daily.    Dispense:  180 tablet    Refill:  3    Return in about 5 months (around 09/23/2024).    Pastor Falling, MD 04/23/2024, 10:47 PM  [facility] [address], [address] [place], [place] [address] [phone]

## 2024-04-23 NOTE — Patient Instructions (Signed)
 Start Levetiracetam 250 mg twice daily, side effect including somnolence, tiredness, irritability Continue your other medications Please contact me if you do have any breakthrough seizures Contact me if the tremors do get worse Discussed driving restriction for next 6 months, she voiced understanding Follow-up in 6 months or sooner if worse.

## 2024-04-24 ENCOUNTER — Ambulatory Visit (INDEPENDENT_AMBULATORY_CARE_PROVIDER_SITE_OTHER): Admitting: Podiatry

## 2024-04-24 DIAGNOSIS — L600 Ingrowing nail: Secondary | ICD-10-CM

## 2024-04-24 NOTE — Progress Notes (Signed)
 She presents today for follow-up of her matrixectomy third digit left foot.  States that she has stopped soaking she is not wearing a Band-Aid any longer it does not hurt at all.  Objective: There is no erythema edema cellulitis drainage or odor.  No signs of infection.  Assessment: Well-healing surgical toes third left.  Plan: Follow-up with me on an as-needed basis.

## 2024-05-21 DIAGNOSIS — G4733 Obstructive sleep apnea (adult) (pediatric): Secondary | ICD-10-CM | POA: Diagnosis not present

## 2024-05-29 DIAGNOSIS — Z7901 Long term (current) use of anticoagulants: Secondary | ICD-10-CM | POA: Diagnosis not present

## 2024-05-29 DIAGNOSIS — I10 Essential (primary) hypertension: Secondary | ICD-10-CM | POA: Diagnosis not present

## 2024-06-26 ENCOUNTER — Telehealth: Payer: Self-pay | Admitting: Cardiology

## 2024-06-26 ENCOUNTER — Encounter: Payer: Self-pay | Admitting: Cardiology

## 2024-06-26 DIAGNOSIS — D225 Melanocytic nevi of trunk: Secondary | ICD-10-CM | POA: Diagnosis not present

## 2024-06-26 DIAGNOSIS — L638 Other alopecia areata: Secondary | ICD-10-CM | POA: Diagnosis not present

## 2024-06-26 DIAGNOSIS — L2989 Other pruritus: Secondary | ICD-10-CM | POA: Diagnosis not present

## 2024-06-26 DIAGNOSIS — L814 Other melanin hyperpigmentation: Secondary | ICD-10-CM | POA: Diagnosis not present

## 2024-06-26 DIAGNOSIS — L821 Other seborrheic keratosis: Secondary | ICD-10-CM | POA: Diagnosis not present

## 2024-06-26 NOTE — Telephone Encounter (Signed)
 Patient c/o Palpitations: STAT if patient c/o lightheadedness, shortness of breath, or chest pain  How long have you had palpitations/irregular HR/ Afib? Are you having the symptoms now? Yes   Are you currently experiencing lightheadedness, SOB or CP?  no  Do you have a history of afib (atrial fibrillation) or irregular heart rhythm?  yes  Have you checked your BP or HR? (document readings if available):  HR 87  Are you experiencing any other symptoms? Tiredness  Patient states that she took extra metoprolol 

## 2024-06-26 NOTE — Telephone Encounter (Signed)
 Spoke with pt, she is not sure but she feels she is in atrial fib. She reports she does not really feel it like she did prior to the ablation. She reports it started on Monday this week and usually taking an extra metoprolol  will help but this time it has not helped as much. She reports the highest her heart rate has been is 144 bpm, esp when she exercises but will quickly return to 60-70 when she stops. She is being alerted by her watch and she is currently in the dermatology office, but when she gets home she is going to send us  some ECG's from her watch. She feels fine, no chest pain, she reports her husband feels she is more SOB.

## 2024-07-01 ENCOUNTER — Ambulatory Visit (HOSPITAL_COMMUNITY)
Admission: RE | Admit: 2024-07-01 | Discharge: 2024-07-01 | Disposition: A | Discharge status: Home / Self Care | Source: Ambulatory Visit | Attending: Physician Assistant | Admitting: Physician Assistant

## 2024-07-01 VITALS — BP 160/74 | HR 68 | Ht 62.0 in | Wt 210.4 lb

## 2024-07-01 DIAGNOSIS — I480 Paroxysmal atrial fibrillation: Secondary | ICD-10-CM | POA: Diagnosis not present

## 2024-07-01 DIAGNOSIS — I4891 Unspecified atrial fibrillation: Secondary | ICD-10-CM | POA: Diagnosis not present

## 2024-07-01 DIAGNOSIS — D6869 Other thrombophilia: Secondary | ICD-10-CM

## 2024-07-01 NOTE — Progress Notes (Signed)
 Primary Care Physician: Yolande Toribio MATSU, MD Primary Cardiologist: Dr Jeffrie Primary Electrophysiologist: Dr Inocencio Referring Physician: Dr Inocencio Rilla Erika Kim is a 78 y.o. female with a history of hypertension, hyperlipidemia, paroxysmal atrial fibrillation, diabetes, and OSA on CPAP who presents for follow up in the [facility]. Patient recently underwent afib ablation with Dr Inocencio on 05/29/19. She reports that she felt great for over two weeks. Unfortunately, she had episodes of heart racing and feeling jittery inside. Patient is s/p DCCV 06/24/19 and she initially felt well but then had another episode of rapid heart rate in the 120's the next morning but this has slowed to a normal rate by the afternoon. Her flecainide  was increased to 75 mg BID. Patient is s/p repeat afib ablation with a flutter ablation 10/29/19.   Patient returns for follow up for atrial fibrillation. Patient reports that she was in afib all last week (Kardia strips personally reviewed). There were no specific triggers that she could identify. She also had an episode of afib in July lasting about one day. No bleeding issues on anticoagulation.   Today, she  denies symptoms of palpitations, chest pain, shortness of breath, orthopnea, PND, lower extremity edema, dizziness, presyncope, syncope, bleeding, or neurologic sequela. The patient is tolerating medications without difficulties and is otherwise without complaint today.    Atrial Fibrillation Risk Factors:  she does have symptoms or diagnosis of sleep apnea. she does not have a history of rheumatic fever. she does not have a history of alcohol  use. The patient does have a history of early familial atrial fibrillation or other arrhythmias. Father had PPM.   Atrial Fibrillation Management history:  Previous antiarrhythmic drugs: flecainide  Previous cardioversions: 06/24/19 Previous ablations: 05/29/19, 10/29/19 Anticoagulation  history: Xarelto    Past Medical History:  Diagnosis Date   Acoustic neuroma (HCC) followed by dr brown at [facility]   right side w/ chronic roaring noise   Anemia    hx of 2019 realted to Xarelto     Anticoagulant long-term use    xarelto    Benign paroxysmal positional vertigo    Dysrhythmia    afib- 2 ablations followed by Dr Jeffrie - arnetta - 4/21    Endometrial polyp    Family history of adverse reaction to anesthesia    daughter- nausea    Frequency of urination    Hx of transfusion of packed red blood cells    related to small intestine in 2019    Hyperlipidemia    Hypertension    OA (osteoarthritis)    left knee   OSA on CPAP    cpap - setting at ? 8    PAF (paroxysmal atrial fibrillation) [facility]) cardiologist-  dr jeffrie   dx 04/ 2017   PMB (postmenopausal bleeding)    TMJ (temporomandibular joint disorder)    left side-- wears guard   Type 2 diabetes mellitus (HCC)    Wears contact lenses      Current Outpatient Medications  Medication Sig Dispense Refill   allopurinol  (ZYLOPRIM ) 100 MG tablet Take 100 mg by mouth daily.     atorvastatin  (LIPITOR) 40 MG tablet Take 40 mg by mouth every morning.      betamethasone valerate (VALISONE) 0.1 % cream Apply 1 application  topically daily as needed (dermatitis).     betamethasone, augmented, (DIPROLENE) 0.05 % lotion Apply topically daily.     Cholecalciferol (VITAMIN D3) 5000 units TABS Take 5,000 Units by mouth daily.  Cranberry-Vitamin C (CRANBERRY CONCENTRATE/VITAMINC PO) Take 1 tablet by mouth daily.      docusate sodium  (COLACE) 100 MG capsule Take 100 mg by mouth 2 (two) times daily.     furosemide (LASIX) 20 MG tablet 1 tablet Orally Once a day; Duration: 90 days take early in the day     ipratropium (ATROVENT ) 0.03 % nasal spray Place 2 sprays into both nostrils every 12 (twelve) hours as needed for rhinitis.     JANUVIA 100 MG tablet Take 100 mg by mouth daily after supper.      Lancets (ONETOUCH DELICA PLUS  LANCET33G) MISC      levETIRAcetam  (KEPPRA ) 250 MG tablet Take 1 tablet (250 mg total) by mouth 2 (two) times daily. 180 tablet 3   Lidocaine  HCl (ASPERCREME LIDOCAINE ) 4 % CREA 1 Application.     metoprolol  succinate (TOPROL -XL) 50 MG 24 hr tablet take 1 tablet by mouth in the morning and at bedtime with or immediately following a meal 180 tablet 2   Multiple Vitamin (MULTIVITAMIN) tablet Take 1 tablet by mouth daily.     mupirocin  ointment (BACTROBAN ) 2 % Apply 1 Application topically 2 (two) times daily. (Patient taking differently: Apply 1 Application topically as needed.) 22 g 0   olmesartan (BENICAR) 20 MG tablet Take 20 mg by mouth daily.     ONE TOUCH ULTRA TEST test strip      Polyethyl Glycol-Propyl Glycol (SYSTANE OP) Place 1 drop into both eyes daily as needed (dry eyes).     PRESCRIPTION MEDICATION Inhale 1 Device into the lungs See admin instructions. CPAP- At bedtime     rivaroxaban  (XARELTO ) 20 MG TABS tablet TAKE ONE TABLET BY MOUTH ONCE A DAY WITH SUPPER 90 tablet 1   saline (AYR) GEL Place 1 application into the nose at bedtime.     No current facility-administered medications for this encounter.    ROS- All systems are reviewed and negative except as per the HPI above.  Physical Exam: Vitals:   07/01/24 1404  BP: (!) 160/74  Pulse: 68  Weight: 95.4 kg  Height: 5' 2 (1.575 m)     GEN: Well nourished, well developed in no acute distress CARDIAC: Regular rate and rhythm, no murmurs, rubs, gallops RESPIRATORY:  Clear to auscultation without rales, wheezing or rhonchi  ABDOMEN: Soft, non-tender, non-distended EXTREMITIES:  No edema; No deformity    Wt Readings from Last 3 Encounters:  07/01/24 95.4 kg  04/23/24 94.3 kg  04/01/24 94 kg    EKG today demonstrates SR Vent. rate 68 BPM PR interval 196 ms QRS duration 80 ms QT/QTcB 380/404 ms   Echo 03/28/16 demonstrated  - Left ventricle: The cavity size was normal. Systolic function was   normal. The  estimated ejection fraction was in the range of 60%   to 65%. Wall motion was normal; there were no regional wall   motion abnormalities. The transmitral flow pattern was normal.   Left ventricular diastolic function parameters were normal. - Aortic valve: Moderate focal calcification, consistent with   sclerosis. There was mild regurgitation. - Mitral valve: There was mild regurgitation.   Epic records are reviewed at length today  CHA2DS2-VASc Score = 5  The patient's score is based upon: CHF History: 0 HTN History: 1 Diabetes History: 1 Stroke History: 0 Vascular Disease History: 0 Age Score: 2 Gender Score: 1       ASSESSMENT AND PLAN: Paroxysmal Atrial Fibrillation (ICD10:  I48.0) The patient's CHA2DS2-VASc score is 5,  indicating a 7.2% annual risk of stroke.   S/p afib ablation 05/29/19, repeat afib and flutter ablation 10/29/19 She is in SR today but was in afib several days last week. We discussed rhythm control options today including watchful waiting, AAD, and repeat ablation. She would like to discuss PFA with Dr Inocencio, will refer.  Continue Xarelto  20 mg daily Continue Toprol  50 mg daily  Secondary Hypercoagulable State (ICD10:  D68.69) The patient is at significant risk for stroke/thromboembolism based upon her CHA2DS2-VASc Score of 5.  Continue Rivaroxaban  (Xarelto ). No bleeding issues.   Obesity Body mass index is 38.48 kg/m.  Encouraged lifestyle modification  OSA  Encouraged nightly CPAP  HTN Elevated today, better controlled on home monitor.  No changes today.   Follow up with Dr Inocencio to discuss possible repeat ablation.    Daril Kicks PA-C [facility] [address] [place], [place] [address] [phone] 07/01/2024 4:13 PM

## 2024-07-02 ENCOUNTER — Ambulatory Visit (HOSPITAL_COMMUNITY): Admitting: Physician Assistant

## 2024-07-09 DIAGNOSIS — H04123 Dry eye syndrome of bilateral lacrimal glands: Secondary | ICD-10-CM | POA: Diagnosis not present

## 2024-07-09 DIAGNOSIS — Z961 Presence of intraocular lens: Secondary | ICD-10-CM | POA: Diagnosis not present

## 2024-07-09 DIAGNOSIS — E119 Type 2 diabetes mellitus without complications: Secondary | ICD-10-CM | POA: Diagnosis not present

## 2024-07-09 DIAGNOSIS — H43812 Vitreous degeneration, left eye: Secondary | ICD-10-CM | POA: Diagnosis not present

## 2024-07-09 DIAGNOSIS — H35033 Hypertensive retinopathy, bilateral: Secondary | ICD-10-CM | POA: Diagnosis not present

## 2024-07-31 ENCOUNTER — Encounter: Payer: Self-pay | Admitting: Cardiology

## 2024-07-31 ENCOUNTER — Ambulatory Visit: Attending: Cardiology | Admitting: Cardiology

## 2024-07-31 VITALS — BP 140/70 | HR 58 | Ht 62.0 in | Wt 208.0 lb

## 2024-07-31 DIAGNOSIS — D6869 Other thrombophilia: Secondary | ICD-10-CM

## 2024-07-31 DIAGNOSIS — I480 Paroxysmal atrial fibrillation: Secondary | ICD-10-CM | POA: Diagnosis not present

## 2024-07-31 DIAGNOSIS — I10 Essential (primary) hypertension: Secondary | ICD-10-CM | POA: Diagnosis not present

## 2024-07-31 DIAGNOSIS — G4733 Obstructive sleep apnea (adult) (pediatric): Secondary | ICD-10-CM

## 2024-07-31 NOTE — Progress Notes (Signed)
  Electrophysiology Office Note:   Date:  07/31/2024  ID:  Erika Kim, DOB 1946/04/30, MRN [id number]  Primary Cardiologist: Oneil Parchment, MD Primary Heart Failure: None Electrophysiologist: Torrey Horseman Gladis Norton, MD      History of Present Illness:   Erika Kim is a 78 y.o. female with h/o hypertension, hyperlipidemia, atrial fibrillation, diabetes, sleep apnea seen today for routine electrophysiology followup.   She presented to atrial fibrillation clinic 07/01/2024.  She had been in atrial fibrillation for approximately a week.  She identified no specific triggers.  Since last being seen in our clinic the patient reports doing overall well.  She has unfortunately continued to have episodes of atrial fibrillation.  She most of her episodes are short.  Her episode in September was the longest that she has had and lasted approximately a week.  For now, she is happy with her control.  She is not quite ready for further therapy..  she denies chest pain, palpitations, dyspnea, PND, orthopnea, nausea, vomiting, dizziness, syncope, edema, weight gain, or early satiety.   Review of systems complete and found to be negative unless listed in HPI.   EP Information / Studies Reviewed:    EKG is not ordered today. EKG from 07/01/2024 reviewed which showed sinus rhythm        Risk Assessment/Calculations:    CHA2DS2-VASc Score = 5   This indicates a 7.2% annual risk of stroke. The patient's score is based upon: CHF History: 0 HTN History: 1 Diabetes History: 1 Stroke History: 0 Vascular Disease History: 0 Age Score: 2 Gender Score: 1            Physical Exam:   VS:  BP (!) 140/70 (BP Location: Left Arm, Patient Position: Sitting, Cuff Size: Large)   Pulse (!) 58   Ht 5' 2 (1.575 m)   Wt 208 lb (94.3 kg)   SpO2 98%   BMI 38.04 kg/m    Wt Readings from Last 3 Encounters:  07/31/24 208 lb (94.3 kg)  07/01/24 210 lb 6.4 oz (95.4 kg)  04/23/24 208 lb (94.3 kg)     GEN: Well  nourished, well developed in no acute distress NECK: No JVD; No carotid bruits CARDIAC: Regular rate and rhythm, no murmurs, rubs, gallops RESPIRATORY:  Clear to auscultation without rales, wheezing or rhonchi  ABDOMEN: Soft, non-tender, non-distended EXTREMITIES:  No edema; No deformity   ASSESSMENT AND PLAN:    1.  Paroxysmal atrial fibrillation: On metoprolol .  She is unfortunately continued to have atrial fibrillation despite ablation x 2, most recently 03/23/2020.  She is continue to have episodes of atrial fibrillation.  She recently had an episode that lasted for approximately 1 week.  Despite this, she is happy with her control.  She is not quite ready for additional therapies.  She would like to avoid medications and when she is ready, would prefer ablation.  2.  Secondary hypercoagulable state: On Xarelto   3.  Hypertension: Well-controlled  4.  Obstructive sleep apnea: CPAP compliance encouraged  Follow up with Afib Clinic as usual post procedure  Signed, Leonette Tischer Gladis Norton, MD

## 2024-08-07 ENCOUNTER — Other Ambulatory Visit: Payer: Self-pay | Admitting: Cardiology

## 2024-08-07 DIAGNOSIS — I480 Paroxysmal atrial fibrillation: Secondary | ICD-10-CM

## 2024-08-08 NOTE — Telephone Encounter (Signed)
 Prescription refill request for Xarelto  received.  Indication:afib Last office visit:10/25 Weight:94.3  kg Age:78 Scr:0.63  6/25 CrCl:113.09  ml/min  Prescription refilled

## 2024-08-13 DIAGNOSIS — Z1272 Encounter for screening for malignant neoplasm of vagina: Secondary | ICD-10-CM | POA: Diagnosis not present

## 2024-08-19 DIAGNOSIS — G4733 Obstructive sleep apnea (adult) (pediatric): Secondary | ICD-10-CM | POA: Diagnosis not present

## 2024-08-26 DIAGNOSIS — L638 Other alopecia areata: Secondary | ICD-10-CM | POA: Diagnosis not present

## 2024-08-28 ENCOUNTER — Ambulatory Visit: Admitting: Podiatry

## 2024-09-02 ENCOUNTER — Ambulatory Visit: Admitting: Cardiology

## 2024-09-02 ENCOUNTER — Ambulatory Visit: Attending: Cardiology | Admitting: Cardiology

## 2024-09-02 ENCOUNTER — Encounter: Payer: Self-pay | Admitting: Cardiology

## 2024-09-02 VITALS — BP 170/81 | HR 65 | Ht 62.0 in | Wt 210.0 lb

## 2024-09-02 DIAGNOSIS — I480 Paroxysmal atrial fibrillation: Secondary | ICD-10-CM | POA: Diagnosis not present

## 2024-09-02 DIAGNOSIS — D6869 Other thrombophilia: Secondary | ICD-10-CM | POA: Diagnosis not present

## 2024-09-02 DIAGNOSIS — E1151 Type 2 diabetes mellitus with diabetic peripheral angiopathy without gangrene: Secondary | ICD-10-CM | POA: Diagnosis not present

## 2024-09-02 DIAGNOSIS — I10 Essential (primary) hypertension: Secondary | ICD-10-CM | POA: Diagnosis not present

## 2024-09-02 DIAGNOSIS — G4733 Obstructive sleep apnea (adult) (pediatric): Secondary | ICD-10-CM | POA: Diagnosis not present

## 2024-09-02 NOTE — Progress Notes (Signed)
  Cardiology Office Note:  .   Date:  09/02/2024  ID:  Erika Kim, DOB [birth year], MRN [id number] PCP: Yolande Toribio MATSU, MD   [facility] Providers Cardiologist:  Oneil Parchment, MD Electrophysiologist:  Will Gladis Norton, MD     History of Present Illness: .   Erika Kim is a 78 y.o. female Discussed the use of AI scribe  History of Present Illness Erika Kim is a 78 year old female with atrial fibrillation, hypertension, and hyperlipidemia who presents for follow-up.  She has a history of atrial fibrillation with episodes lasting up to one week, despite being on metoprolol  and having undergone two ablations, the most recent in 2021. She is currently taking metoprolol  50 mg and Xarelto  for anticoagulation. She experiences episodes of atrial fibrillation without specific triggers, with the last episode occurring around August 01, 2024.  Her hypertension is managed with olmesartan, which was recently increased to 40 mg. She notes that her blood pressure tends to be elevated during doctor visits.  Her LDL is 80.  She uses CPAP for sleep apnea and confirms regular use of the device.  She also has an acoustic neuroma with associated deafness.  Socially, she and her partner travel to [place]  in the winter with their camper, staying for about two and a half months, enjoying the change of pace and socializing with friends at the [place].  No bleeding problems or unusual symptoms. Her hemoglobin is 13.3, creatinine is 0.6, and ALT is 21, all within normal limits.      Studies Reviewed: .        Results LABS Hb: 13.3 Cr: 0.6 ALT: 21 LDL: 80  DIAGNOSTIC EKG: Sinus rhythm (07/01/2024) Risk Assessment/Calculations:        Physical Exam:   VS:  BP (!) 170/81   Pulse 65   Ht 5' 2 (1.575 m)   Wt 210 lb (95.3 kg)   SpO2 97%   BMI 38.41 kg/m    Wt Readings from Last 3 Encounters:  09/02/24 210 lb (95.3 kg)  07/31/24 208 lb (94.3 kg)  07/01/24 210 lb  6.4 oz (95.4 kg)    GEN: Well nourished, well developed in no acute distress NECK: No JVD; No carotid bruits CARDIAC: RRR, no murmurs, no rubs, no gallops RESPIRATORY:  Clear to auscultation without rales, wheezing or rhonchi  ABDOMEN: Soft, non-tender, non-distended EXTREMITIES:  No edema; No deformity   ASSESSMENT AND PLAN: .    Assessment and Plan Assessment & Plan Atrial fibrillation parox Episodes lasting up to one week. CHADS VASc score is 5. Currently on metoprolol  and Xarelto . Two previous ablations, most recent in 2021. Prefers ablation in the future if necessary. No urgency for additional therapies at this time. - Continue metoprolol  50 mg daily. OK to take extra if needed.  - Continue Xarelto  daily. - Encouraged CPAP use. - Will consider future ablation if necessary.  Hypertension Managed with Benicar (olmesartan) 40 mg daily. Recent increase in dosage from 20 mg to 40 mg, possibly by Dr. Jakie. - Continue Benicar 40 mg daily.  Hyperlipidemia Managed with atorvastatin  40 mg daily. LDL level is 80, indicating good control. - Continue atorvastatin  40 mg daily.  Obstructive sleep apnea Managed with CPAP. Compliance with CPAP use reported. - Continue CPAP use.          Signed, Oneil Parchment, MD

## 2024-09-02 NOTE — Patient Instructions (Signed)

## 2024-09-09 DIAGNOSIS — M816 Localized osteoporosis [Lequesne]: Secondary | ICD-10-CM | POA: Diagnosis not present

## 2024-09-09 DIAGNOSIS — Z1231 Encounter for screening mammogram for malignant neoplasm of breast: Secondary | ICD-10-CM | POA: Diagnosis not present

## 2024-09-16 ENCOUNTER — Ambulatory Visit: Admitting: Podiatry

## 2024-09-16 ENCOUNTER — Encounter: Payer: Self-pay | Admitting: Podiatry

## 2024-09-16 DIAGNOSIS — M19072 Primary osteoarthritis, left ankle and foot: Secondary | ICD-10-CM

## 2024-09-16 DIAGNOSIS — G40909 Epilepsy, unspecified, not intractable, without status epilepticus: Secondary | ICD-10-CM | POA: Insufficient documentation

## 2024-09-16 DIAGNOSIS — M19071 Primary osteoarthritis, right ankle and foot: Secondary | ICD-10-CM | POA: Diagnosis not present

## 2024-09-16 MED ORDER — TRIAMCINOLONE ACETONIDE 40 MG/ML IJ SUSP
40.0000 mg | Freq: Once | INTRAMUSCULAR | Status: AC
  Administered 2024-09-16: 40 mg

## 2024-09-16 NOTE — Progress Notes (Signed)
 She presents today complaining primarily of the arthritis in her bilateral foot.  Dorsal aspect tarsometatarsal joint area right.  And then pain around the anterior lateral aspect of the left foot as she points to the subtalar joint.  Objective: Vital signs are stable alert and oriented x 3 she has mild edema bilateral lower extremity today pulses are strongly palpable.  She has pain on end range of motion and attempted range of motion of the subtalar joint left foot.  The majority of her pain is located however over the dorsal aspect of the TMT's of the right foot.  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  Plan: Discussed etiology pathology conservative surgical therapies at this point I injected subtalar joint today with Kenalog  10 mg and 10 mg in the dorsal aspect of the right foot.  Follow-up with her in 6 months remember to ask [place]  otherwise.

## 2024-09-16 NOTE — Patient Instructions (Signed)
 Had type

## 2024-09-22 DIAGNOSIS — M810 Age-related osteoporosis without current pathological fracture: Secondary | ICD-10-CM | POA: Diagnosis not present

## 2024-09-23 ENCOUNTER — Ambulatory Visit: Admitting: Neurology

## 2024-09-23 ENCOUNTER — Encounter: Payer: Self-pay | Admitting: Neurology

## 2024-09-23 VITALS — BP 146/81 | HR 57 | Ht 62.0 in | Wt 206.0 lb

## 2024-09-23 DIAGNOSIS — R519 Headache, unspecified: Secondary | ICD-10-CM | POA: Diagnosis not present

## 2024-09-23 DIAGNOSIS — G4733 Obstructive sleep apnea (adult) (pediatric): Secondary | ICD-10-CM | POA: Diagnosis not present

## 2024-09-23 DIAGNOSIS — G250 Essential tremor: Secondary | ICD-10-CM

## 2024-09-23 DIAGNOSIS — G40909 Epilepsy, unspecified, not intractable, without status epilepticus: Secondary | ICD-10-CM | POA: Diagnosis not present

## 2024-09-23 NOTE — Progress Notes (Signed)
 [facility]  PATIENT: Erika Kim DOB: 21-Mar-1946  REQUESTING CLINICIAN: Yolande Toribio MATSU, MD HISTORY FROM: Patient/Husband  REASON FOR VISIT: Seizure    HISTORICAL  CHIEF COMPLAINT:  Chief Complaint  Patient presents with   Follow-up    Pt in room 12. Husband in room. Here for seizure follow up    INTERVAL HISTORY 09/23/2024 Patient presents today for follow-up, she is accompanied by her husband.  Last visit was in July, at that time we started her on Keppra  250 mg twice daily, and since then she has not had any additional events.  She tolerates the medication well.  Initially she did have some increased fatigue and somnolence but her symptoms are improving.  She is also reporting left-sided headache.  She does have a history of left-sided TMJ.  Takes Tylenol  as needed with improvement.  Denies any fall, denies any new weakness, no new numbness, denies any worsening of her tremors.  She notes that her hand writing is difficult but does not have any additional difficulty with buttoning her shirt or eating or putting make-up on.  HISTORY OF PRESENT ILLNESS:  This is a 78 year old woman past medical history of hypertension, hyperlipidemia, atrial fibrillation who is presenting after a few nocturnal seizures.  Husband stated during sleep she will have generalized shaking, associated with heavy breathing and unresponsiveness.  She had a total of 3 events last episode being last night, July 1.  Prior to that it was on June 10.  They presented to the hospital, had a full workup including MRI and EEG and it was unrevealing.  Patient was not started on medication at that time.  She was doing well from June 10, until July 1 when again she did have a seizure out of sleep.  Husband tells me in the morning patient does not remember the event, her last event was last night so today she is very tired.  She denies any injury such as tongue biting or urinary incontinence and no  falling out of bed.  Denies any family history of seizure denies any other seizure risk factors.  Of note she was noted to have voice tremor, and action tremors. On occasion she will drop her food on the fork when eating.  Denies any other symptoms associated with action tremor however her paternal grandfather has very bad tremor and she used to feed him because he could not feed himself.  Handedness: Right handed   Onset: 2 months ago   Seizure Type: Nocturnal convulsion   Current frequency: 3 events, last event  July 1  Any injuries from seizures: Denies  Seizure risk factors: Denies   Previous ASMs: None   Currenty ASMs: None   ASMs side effects: N/A  Brain Images: No acute abnormality   Previous EEGs: Normal    OTHER MEDICAL CONDITIONS: Hypertension, Hyperlipidemia, Atrial fibrillation  REVIEW OF SYSTEMS: Full 14 system review of systems performed and negative with exception of: As noted in the HPI   ALLERGIES: Allergies  Allergen Reactions   Cephalexin Itching    Received 2 grams ancef  03/28/2022 without problem   Chlorthalidone      Other Reaction(s): seizures   Augmentin [Amoxicillin-Pot Clavulanate] Itching    severe     Cephalosporins Itching    severe   Penicillins Rash    HOME MEDICATIONS: Outpatient Medications Prior to Visit  Medication Sig Dispense Refill   allopurinol  (ZYLOPRIM ) 100 MG tablet Take 100 mg by mouth daily.  atorvastatin  (LIPITOR) 40 MG tablet Take 40 mg by mouth every morning.      betamethasone valerate (VALISONE) 0.1 % cream Apply 1 application  topically daily as needed (dermatitis).     betamethasone, augmented, (DIPROLENE) 0.05 % lotion Apply topically daily.     Cholecalciferol (VITAMIN D3) 5000 units TABS Take 5,000 Units by mouth daily.      Cranberry-Vitamin C (CRANBERRY CONCENTRATE/VITAMINC PO) Take 1 tablet by mouth daily.      docusate sodium  (COLACE) 100 MG capsule Take 100 mg by mouth 2 (two) times daily.      furosemide (LASIX) 20 MG tablet 1 tablet Orally Once a day; Duration: 90 days take early in the day     ipratropium (ATROVENT ) 0.03 % nasal spray Place 2 sprays into both nostrils every 12 (twelve) hours as needed for rhinitis.     JANUVIA 100 MG tablet Take 100 mg by mouth daily after supper.      Lancets (ONETOUCH DELICA PLUS LANCET33G) MISC      levETIRAcetam  (KEPPRA ) 250 MG tablet Take 1 tablet (250 mg total) by mouth 2 (two) times daily. 180 tablet 3   Lidocaine  HCl (ASPERCREME LIDOCAINE ) 4 % CREA 1 Application.     metoprolol  succinate (TOPROL -XL) 50 MG 24 hr tablet take 1 tablet by mouth in the morning and at bedtime with or immediately following a meal 180 tablet 2   Multiple Vitamin (MULTIVITAMIN) tablet Take 1 tablet by mouth daily.     mupirocin  ointment (BACTROBAN ) 2 % Apply 1 Application topically 2 (two) times daily. (Patient taking differently: Apply 1 Application topically as needed.) 22 g 0   olmesartan (BENICAR) 40 MG tablet Take 40 mg by mouth daily.     ONE TOUCH ULTRA TEST test strip      Polyethyl Glycol-Propyl Glycol (SYSTANE OP) Place 1 drop into both eyes daily as needed (dry eyes).     PRESCRIPTION MEDICATION Inhale 1 Device into the lungs See admin instructions. CPAP- At bedtime     rivaroxaban  (XARELTO ) 20 MG TABS tablet TAKE ONE TABLET BY MOUTH ONCE A DAY WITH SUPPER 90 tablet 1   saline (AYR) GEL Place 1 application into the nose at bedtime.     No facility-administered medications prior to visit.    PAST MEDICAL HISTORY: Past Medical History:  Diagnosis Date   Acoustic neuroma (HCC) followed by dr brown at [facility]   right side w/ chronic roaring noise   Anemia    hx of 2019 realted to Xarelto     Anticoagulant long-term use    xarelto    Benign paroxysmal positional vertigo    Dysrhythmia    afib- 2 ablations followed by Dr Jeffrie - arnetta - 4/21    Endometrial polyp    Family history of adverse reaction to anesthesia    daughter- nausea    Frequency of  urination    Hx of transfusion of packed red blood cells    related to small intestine in 2019    Hyperlipidemia    Hypertension    OA (osteoarthritis)    left knee   OSA on CPAP    cpap - setting at ? 8    PAF (paroxysmal atrial fibrillation) [facility]) cardiologist-  dr jeffrie   dx 04/ 2017   PMB (postmenopausal bleeding)    TMJ (temporomandibular joint disorder)    left side-- wears guard   Type 2 diabetes mellitus (HCC)    Wears contact lenses     PAST SURGICAL  HISTORY: Past Surgical History:  Procedure Laterality Date   ATRIAL FIBRILLATION ABLATION N/A 05/29/2019   Procedure: ATRIAL FIBRILLATION ABLATION;  Surgeon: Inocencio Soyla Lunger, MD;  Location: [facility];  Service: Cardiovascular;  Laterality: N/A;   ATRIAL FIBRILLATION ABLATION N/A 10/29/2019   Procedure: ATRIAL FIBRILLATION ABLATION;  Surgeon: Inocencio Soyla Lunger, MD;  Location: [facility];  Service: Cardiovascular;  Laterality: N/A;   CARDIOVERSION N/A 06/24/2019   Procedure: CARDIOVERSION;  Surgeon: Raford Riggs, MD;  Location: [facility];  Service: Cardiovascular;  Laterality: N/A;   CARPAL TUNNEL RELEASE Right 04/17/2007   w/ Excision ganglion cyst and Pulley Release right thumb   DILATATION & CURETTAGE/HYSTEROSCOPY WITH MYOSURE N/A 01/12/2017   Procedure: DILATATION & CURETTAGE/HYSTEROSCOPY WITH MYOSURE;  Surgeon: Norleen Skill, MD;  Location: [facility];  Service: Gynecology;  Laterality: N/A;   ESOPHAGOGASTRODUODENOSCOPY (EGD) WITH PROPOFOL  N/A 09/16/2018   Procedure: ESOPHAGOGASTRODUODENOSCOPY (EGD) WITH PROPOFOL ;  Surgeon: Donnald Charleston, MD;  Location: [facility] END[facility]COPY;  Service: Endoscopy;  Laterality: N/A;   GIVENS CAPSULE STUDY N/A 09/16/2018   Procedure: GIVENS CAPSULE STUDY;  Surgeon: Donnald Charleston, MD;  Location: [facility] ENDOSCOPY;  Service: Endoscopy;  Laterality: N/A;   HAMMER TOE SURGERY Left 2010   left second toe   IR RADIOLOGY PERIPHERAL GUIDED IV START  10/27/2019    IR US  GUIDE VASC ACCESS RIGHT  10/27/2019   KNEE ARTHROSCOPY Right 2010   KNEE SURGERY Left 1993   LYMPH NODE BIOPSY N/A 03/01/2017   Procedure: SENTINEL LYMPH NODE BIOPSY;  Surgeon: Eloy Herring, MD;  Location: [facility];  Service: Gynecology;  Laterality: N/A;   NASAL SINUS SURGERY  1985 and 1995   REVERSE SHOULDER ARTHROPLASTY Left 03/28/2022   Procedure: REVERSE SHOULDER ARTHROPLASTY;  Surgeon: Josefina Chew, MD;  Location: [facility];  Service: Orthopedics;  Laterality: Left;   ROBOTIC ASSISTED TOTAL HYSTERECTOMY WITH BILATERAL SALPINGO OOPHERECTOMY Bilateral 03/01/2017   Procedure: XI ROBOTIC ASSISTED TOTAL HYSTERECTOMY WITH BILATERAL SALPINGO OOPHORECTOMY;  Surgeon: Eloy Herring, MD;  Location: [facility];  Service: Gynecology;  Laterality: Bilateral;   ROTATOR CUFF REPAIR Right 04/2004   TOE SURGERY Right 2012   Great toe and second toe   TOTAL ELBOW REPLACEMENT Right 2000   TOTAL KNEE ARTHROPLASTY Right 01/12/2010   TOTAL KNEE ARTHROPLASTY Left 03/02/2020   Procedure: TOTAL KNEE ARTHROPLASTY;  Surgeon: Beverley Evalene BIRCH, MD;  Location: [facility];  Service: Orthopedics;  Laterality: Left;   TRANSTHORACIC ECHOCARDIOGRAM  03/28/2016   ef 60-65%/ mild AR and MR   TUBAL LIGATION Bilateral 1978   ULNAR NERVE TRANSPOSITION Right 04/21/2021   Procedure: RIGHT ULNAR NERVE DECOMPRESSION ELBOW;  Surgeon: Murrell Drivers, MD;  Location: [facility];  Service: Orthopedics;  Laterality: Right;  axillary block    FAMILY HISTORY: Family History  Problem Relation Age of Onset   Heart failure Mother    Heart failure Father    Sleep apnea Brother     SOCIAL HISTORY: Social History   Socioeconomic History   Marital status: Married    Spouse name: Not on file   Number of children: 2   Years of education: 12   Highest education level: Not on file  Occupational History   Not on file  Tobacco Use   Smoking status: Never    Passive exposure: Never   Smokeless tobacco: Never  Vaping Use    Vaping status: Never Used  Substance and Sexual Activity   Alcohol  use: No   Drug use: No  Sexual activity: Yes  Other Topics Concern   Not on file  Social History Narrative   Married w/2 children (boy and girl).  Retired.   Social Drivers of Corporate Investment Banker Strain: Not on file  Food Insecurity: Low Risk  (01/08/2024)   Received from [facility]   Hunger Vital Sign    Within the past 12 months, you worried that your food would run out before you got money to buy more: Never true    Within the past 12 months, the food you bought just didn't last and you didn't have money to get more. : Never true  Transportation Needs: No Transportation Needs (01/08/2024)   Received from [facility]    In the past 12 months, has lack of reliable transportation kept you from medical appointments, meetings, work or from getting things needed for daily living? : No  Physical Activity: Not on file  Stress: Not on file  Social Connections: Not on file  Intimate Partner Violence: Not on file     PHYSICAL EXAM   GENERAL EXAM/CONSTITUTIONAL: Vitals:  Vitals:   09/23/24 1039  BP: (!) 146/81  Pulse: (!) 57  Weight: 206 lb (93.4 kg)  Height: 5' 2 (1.575 m)   Body mass index is 37.68 kg/m. Wt Readings from Last 3 Encounters:  09/23/24 206 lb (93.4 kg)  09/02/24 210 lb (95.3 kg)  07/31/24 208 lb (94.3 kg)   Patient is in no distress; well developed, nourished and groomed; neck is supple  MUSCULOSKELETAL: Gait, strength, tone, movements noted in Neurologic exam below  NEUROLOGIC: MENTAL STATUS:      No data to display         awake, alert, oriented to person, place and time recent and remote memory intact normal attention and concentration language fluent, comprehension intact, naming intact fund of knowledge appropriate  CRANIAL NERVE:  2nd, 3rd, 4th, 6th - Visual fields full to confrontation, extraocular muscles intact, no nystagmus 5th - facial  sensation symmetric 7th - facial strength symmetric 8th - hearing intact 9th - palate elevates symmetrically, uvula midline 11th - shoulder shrug symmetric 12th - tongue protrusion midline  MOTOR:  normal bulk and tone, full strength in the BUE, BLE  SENSORY:  normal and symmetric to light touch  COORDINATION:  Action tremors noted with FNF   GAIT/STATION:  normal    DIAGNOSTIC DATA (LABS, IMAGING, TESTING) - I reviewed patient records, labs, notes, testing and imaging myself where available.  Lab Results  Component Value Date   WBC 6.3 03/31/2024   HGB 13.3 03/31/2024   HCT 38.8 03/31/2024   MCV 85.8 03/31/2024   PLT 166 03/31/2024      Component Value Date/Time   NA 136 04/01/2024 0535   NA 134 03/24/2024 1553   K 4.4 03/31/2024 1613   CL 98 03/31/2024 1613   CO2 23 03/31/2024 1613   GLUCOSE 135 (H) 03/31/2024 1613   BUN 13 03/31/2024 1613   BUN 9 03/24/2024 1553   CREATININE 0.63 03/31/2024 1613   CALCIUM  8.6 (L) 03/31/2024 1613   PROT 6.0 (L) 03/31/2024 1613   PROT 6.7 03/24/2024 1553   ALBUMIN 3.7 03/31/2024 1613   ALBUMIN 4.6 03/24/2024 1553   AST 36 03/31/2024 1613   ALT 21 03/31/2024 1613   ALKPHOS 49 03/31/2024 1613   BILITOT 1.1 03/31/2024 1613   BILITOT 0.5 03/24/2024 1553   GFRNONAA >60 03/31/2024 1613   GFRAA >60 02/23/2020 1101  No results found for: CHOL, HDL, LDLCALC, LDLDIRECT, TRIG Lab Results  Component Value Date   HGBA1C 6.0 (H) 04/01/2024   Lab Results  Component Value Date   VITAMINB12 222 09/17/2018   Lab Results  Component Value Date   TSH 2.955 04/01/2024    MRI Brain 03/31/2024 1. No acute intracranial abnormality. 2. Mild cerebral white matter disease, nonspecific, but most commonly related to chronic microvascular ischemic disease. 3. 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   Overnight EEG 04/01/2024 This study is  within normal limits. No seizures or epileptiform discharges were seen throughout the recording.     ASSESSMENT AND PLAN  78 y.o. year old female  with hypertension, hyperlipidemia, atrial fibrillation, who is presenting for follow-up for seizures and essential tremor.  In terms of the seizure, she is doing well on levetiracetam  250 mg twice daily, denies any additional seizures.  Plan will be for patient to continue levetiracetam  250 mg twice daily. For essential tremor, she does have some difficulty with handwriting but the tremor does not affect her day-to-day activity.  Again she is not interested in starting medication at the moment.  Will continue to monitor these events.  I will see her in 1 year for follow-up but she understand to contact me with updates.   1. Nonintractable epilepsy without status epilepticus, unspecified epilepsy type (HCC)   2. Benign essential tremor   3. OSA on CPAP   4. Nonintractable headache, unspecified chronicity pattern, unspecified headache type     Patient Instructions  Continue with Keppra  250 mg twice daily  Please call if you do have another seizure Continue with Tylenol  as needed for the headaches  Continue to follow up with PCP  Return in a year or sooner if worse    Per [place]  DMV statutes, patients with seizures are not allowed to drive until they have been seizure-free for six months.  Other recommendations include using caution when using heavy equipment or power tools. Avoid working on ladders or at heights. Take showers instead of baths.  Do not swim alone.  Ensure the water  temperature is not too high on the home water  heater. Do not go swimming alone. Do not lock yourself in a room alone (i.e. bathroom). When caring for infants or small children, sit down when holding, feeding, or changing them to minimize risk of injury to the child in the event you have a seizure. Maintain good sleep hygiene. Avoid alcohol .  Also recommend adequate  sleep, hydration, good diet and minimize stress.   During the Seizure  - First, ensure adequate ventilation and place patients on the floor on their left side  Loosen clothing around the neck and ensure the airway is patent. If the patient is clenching the teeth, do not force the mouth open with any object as this can cause severe damage - Remove all items from the surrounding that can be hazardous. The patient may be oblivious to what's happening and may not even know what he or she is doing. If the patient is confused and wandering, either gently guide him/her away and block access to outside areas - Reassure the individual and be comforting - Call 911. In most cases, the seizure ends before EMS arrives. However, there are cases when seizures may last over 3 to 5 minutes. Or the individual may have developed breathing difficulties or severe injuries. If a pregnant patient or a person  with diabetes develops a seizure, it is prudent to call an ambulance. - Finally, if the patient does not regain full consciousness, then call EMS. Most patients will remain confused for about 45 to 90 minutes after a seizure, so you must use judgment in calling for help. - Avoid restraints but make sure the patient is in a bed with padded side rails - Place the individual in a lateral position with the neck slightly flexed; this will help the saliva drain from the mouth and prevent the tongue from falling backward - Remove all nearby furniture and other hazards from the area - Provide verbal assurance as the individual is regaining consciousness - Provide the patient with privacy if possible - Call for help and start treatment as ordered by the caregiver   After the Seizure (Postictal Stage)  After a seizure, most patients experience confusion, fatigue, muscle pain and/or a headache. Thus, one should permit the individual to sleep. For the next few days, reassurance is essential. Being calm and helping reorient  the person is also of importance.  Most seizures are painless and end spontaneously. Seizures are not harmful to others but can lead to complications such as stress on the lungs, brain and the heart. Individuals with prior lung problems may develop labored breathing and respiratory distress.    Discussed Patients with epilepsy have a small risk of sudden unexpected death, a condition referred to as sudden unexpected death in epilepsy (SUDEP). SUDEP is defined specifically as the sudden, unexpected, witnessed or unwitnessed, nontraumatic and nondrowning death in patients with epilepsy with or without evidence for a seizure, and excluding documented status epilepticus, in which post mortem examination does not reveal a structural or toxicologic cause for death     No orders of the defined types were placed in this encounter.   No orders of the defined types were placed in this encounter.   Return in about 1 year (around 09/23/2025).    Pastor Falling, MD 09/23/2024, 11:43 AM  [facility] [address] [place], [place] [address] [phone]

## 2024-09-23 NOTE — Patient Instructions (Signed)
 Continue with Keppra  250 mg twice daily  Please call if you do have another seizure Continue with Tylenol  as needed for the headaches  Continue to follow up with PCP  Return in a year or sooner if worse

## 2024-10-03 DIAGNOSIS — J019 Acute sinusitis, unspecified: Secondary | ICD-10-CM | POA: Diagnosis not present

## 2024-10-03 DIAGNOSIS — Z20822 Contact with and (suspected) exposure to covid-19: Secondary | ICD-10-CM | POA: Diagnosis not present

## 2024-10-03 DIAGNOSIS — R519 Headache, unspecified: Secondary | ICD-10-CM | POA: Diagnosis not present

## 2024-10-03 DIAGNOSIS — J029 Acute pharyngitis, unspecified: Secondary | ICD-10-CM | POA: Diagnosis not present

## 2024-11-03 ENCOUNTER — Telehealth: Payer: Self-pay | Admitting: Cardiology

## 2024-11-03 NOTE — Telephone Encounter (Signed)
 Spoke with the patient in regards to when she needs to follow up with our office. She was last seen in October 2025 and discussed an ablation with Dr. Inocencio. He was booked out until January at that time and she spends all of January and February in [place] . She states that she was told to see an APP when she got back. She will be back at the beginning of March. She states that she has been doing good with no issues or concerns with afib recently. Appointment has been scheduled.

## 2024-11-03 NOTE — Telephone Encounter (Signed)
 Patient would like to know exactly how soon she needs to see Dr. Perry Arthur since she hasn't had an appointment with [facility]. Please advise.

## 2024-11-17 NOTE — Progress Notes (Signed)
 Erika Kim                                          MRN: [id number]   11/17/2024   The [facility] Quality Team Specialist reviewed this patient medical record for the purposes of chart review for care gap closure. The following were reviewed: chart review for care gap closure-kidney health evaluation for diabetes:eGFR  and uACR.    [facility] Quality Team

## 2024-11-18 ENCOUNTER — Encounter: Payer: Self-pay | Admitting: Cardiology

## 2024-11-18 MED ORDER — FLECAINIDE ACETATE 50 MG PO TABS: 50.0000 mg | ORAL_TABLET | Freq: Two times a day (BID) | ORAL | 1 refills | Status: AC

## 2024-11-18 NOTE — Telephone Encounter (Signed)
 Pt called in to reply to this message, she said she is already scheduled to see Charlies, [place] 12/26/24. She asked if its okay to wait until then because she doesn't have a cardiologist in [facility] and she is going on her cruise soon. She asked that you send her another mychart message back with response.

## 2024-11-18 NOTE — Addendum Note (Signed)
 Addended by: CASIMIR ALDONA BRAVO on: 11/18/2024 10:45 AM   Modules accepted: Orders

## 2024-12-26 ENCOUNTER — Ambulatory Visit: Admitting: Physician Assistant

## 2025-03-11 ENCOUNTER — Ambulatory Visit: Admitting: Adult Health

## 2025-03-17 ENCOUNTER — Ambulatory Visit: Admitting: Podiatry

## 2025-09-24 ENCOUNTER — Ambulatory Visit: Admitting: Adult Health
